# Patient Record
Sex: Female | Born: 1950 | Race: White | Hispanic: No | Marital: Married | Smoking: Never smoker
Health system: Southern US, Community
[De-identification: ages and names within clinical notes are randomized; demographics above are authoritative.]

## PROBLEM LIST (undated history)

## (undated) DIAGNOSIS — I10 Essential (primary) hypertension: Secondary | ICD-10-CM

## (undated) DIAGNOSIS — R569 Unspecified convulsions: Secondary | ICD-10-CM

## (undated) DIAGNOSIS — E669 Obesity, unspecified: Secondary | ICD-10-CM

## (undated) DIAGNOSIS — R809 Proteinuria, unspecified: Secondary | ICD-10-CM

## (undated) DIAGNOSIS — E785 Hyperlipidemia, unspecified: Secondary | ICD-10-CM

## (undated) DIAGNOSIS — Z8249 Family history of ischemic heart disease and other diseases of the circulatory system: Secondary | ICD-10-CM

## (undated) DIAGNOSIS — I77811 Abdominal aortic ectasia: Secondary | ICD-10-CM

## (undated) DIAGNOSIS — E119 Type 2 diabetes mellitus without complications: Secondary | ICD-10-CM

## (undated) DIAGNOSIS — R921 Mammographic calcification found on diagnostic imaging of breast: Secondary | ICD-10-CM

## (undated) DIAGNOSIS — M5416 Radiculopathy, lumbar region: Secondary | ICD-10-CM

## (undated) DIAGNOSIS — H269 Unspecified cataract: Secondary | ICD-10-CM

## (undated) DIAGNOSIS — M810 Age-related osteoporosis without current pathological fracture: Secondary | ICD-10-CM

## (undated) DIAGNOSIS — L409 Psoriasis, unspecified: Secondary | ICD-10-CM

## (undated) DIAGNOSIS — E1129 Type 2 diabetes mellitus with other diabetic kidney complication: Secondary | ICD-10-CM

## (undated) DIAGNOSIS — M1712 Unilateral primary osteoarthritis, left knee: Secondary | ICD-10-CM

## (undated) HISTORY — DX: Age-related osteoporosis without current pathological fracture: M81.0

## (undated) HISTORY — DX: Radiculopathy, lumbar region: M54.16

## (undated) HISTORY — PX: TUBAL LIGATION: SHX77

## (undated) HISTORY — DX: Type 2 diabetes mellitus with other diabetic kidney complication: R80.9

## (undated) HISTORY — DX: Type 2 diabetes mellitus with other diabetic kidney complication: E11.29

## (undated) HISTORY — DX: Family history of ischemic heart disease and other diseases of the circulatory system: Z82.49

## (undated) HISTORY — DX: Hyperlipidemia, unspecified: E78.5

## (undated) HISTORY — DX: Psoriasis, unspecified: L40.9

## (undated) HISTORY — DX: Type 2 diabetes mellitus without complications: E11.9

## (undated) HISTORY — DX: Mammographic calcification found on diagnostic imaging of breast: R92.1

## (undated) HISTORY — DX: Obesity, unspecified: E66.9

## (undated) HISTORY — DX: Unspecified cataract: H26.9

## (undated) HISTORY — DX: Unilateral primary osteoarthritis, left knee: M17.12

## (undated) HISTORY — DX: Abdominal aortic ectasia: I77.811

## (undated) HISTORY — DX: Unspecified convulsions: R56.9

## (undated) HISTORY — DX: Essential (primary) hypertension: I10

---
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
## 1973-02-06 HISTORY — PX: ABDOMINAL HYSTERECTOMY: SHX81

## 2001-02-06 HISTORY — PX: CYSTECTOMY: SUR359

## 2003-11-19 ENCOUNTER — Inpatient Hospital Stay: Payer: Self-pay | Admitting: Internal Medicine

## 2007-02-07 HISTORY — PX: ROUX-EN-Y GASTRIC BYPASS: SHX1104

## 2007-12-01 ENCOUNTER — Emergency Department: Payer: Self-pay | Admitting: Emergency Medicine

## 2012-03-20 ENCOUNTER — Ambulatory Visit: Payer: Self-pay | Admitting: Family Medicine

## 2012-03-26 ENCOUNTER — Ambulatory Visit: Payer: Self-pay | Admitting: Family Medicine

## 2012-04-08 ENCOUNTER — Ambulatory Visit: Payer: Self-pay | Admitting: General Surgery

## 2012-04-09 HISTORY — PX: BREAST BIOPSY: SHX20

## 2012-04-09 LAB — PATHOLOGY REPORT

## 2014-02-03 ENCOUNTER — Ambulatory Visit: Payer: Self-pay | Admitting: Family Medicine

## 2014-02-04 ENCOUNTER — Ambulatory Visit: Payer: Self-pay | Admitting: Family Medicine

## 2014-02-04 LAB — CK-MB: CK-MB: 1.6 ng/mL (ref 0.5–3.6)

## 2014-02-04 LAB — TROPONIN I: Troponin-I: <0.02 ng/mL

## 2014-07-24 ENCOUNTER — Telehealth: Payer: Self-pay | Admitting: Family Medicine

## 2014-07-24 DIAGNOSIS — E785 Hyperlipidemia, unspecified: Secondary | ICD-10-CM

## 2014-07-24 DIAGNOSIS — E1169 Type 2 diabetes mellitus with other specified complication: Secondary | ICD-10-CM | POA: Insufficient documentation

## 2014-07-24 DIAGNOSIS — E669 Obesity, unspecified: Secondary | ICD-10-CM

## 2014-07-24 DIAGNOSIS — E119 Type 2 diabetes mellitus without complications: Secondary | ICD-10-CM | POA: Insufficient documentation

## 2014-07-24 DIAGNOSIS — Z9884 Bariatric surgery status: Secondary | ICD-10-CM | POA: Insufficient documentation

## 2014-07-24 DIAGNOSIS — I10 Essential (primary) hypertension: Secondary | ICD-10-CM | POA: Insufficient documentation

## 2014-07-24 DIAGNOSIS — E663 Overweight: Secondary | ICD-10-CM | POA: Insufficient documentation

## 2014-07-24 MED ORDER — LISINOPRIL 10 MG PO TABS: 10.0000 mg | ORAL_TABLET | Freq: Every day | ORAL | Status: DC

## 2014-07-24 NOTE — Telephone Encounter (Signed)
Appt [phone]

## 2014-07-24 NOTE — Telephone Encounter (Signed)
Please let [name] know that I'd like to see patient for an appointment here in the office for:  Diabetes, hypertension, high cholesterol Please schedule a visit with me  in the next: 1 month Fasting?  Yes, please Thank you, Dr. Sherie Don

## 2014-07-24 NOTE — Telephone Encounter (Signed)
E-Fax came through for refill: Rx: Lisinopril Rx in basket next to my desk.

## 2014-08-12 DIAGNOSIS — M1712 Unilateral primary osteoarthritis, left knee: Secondary | ICD-10-CM | POA: Insufficient documentation

## 2014-08-12 DIAGNOSIS — E785 Hyperlipidemia, unspecified: Secondary | ICD-10-CM | POA: Insufficient documentation

## 2014-08-12 DIAGNOSIS — I10 Essential (primary) hypertension: Secondary | ICD-10-CM | POA: Insufficient documentation

## 2014-08-18 ENCOUNTER — Ambulatory Visit (INDEPENDENT_AMBULATORY_CARE_PROVIDER_SITE_OTHER): Payer: 59 | Admitting: Family Medicine

## 2014-08-18 ENCOUNTER — Encounter: Payer: Self-pay | Admitting: Family Medicine

## 2014-08-18 VITALS — BP 120/75 | HR 57 | Temp 97.3°F | Wt 175.0 lb

## 2014-08-18 DIAGNOSIS — E559 Vitamin D deficiency, unspecified: Secondary | ICD-10-CM | POA: Diagnosis not present

## 2014-08-18 DIAGNOSIS — Z9884 Bariatric surgery status: Secondary | ICD-10-CM

## 2014-08-18 DIAGNOSIS — E785 Hyperlipidemia, unspecified: Secondary | ICD-10-CM | POA: Diagnosis not present

## 2014-08-18 DIAGNOSIS — I10 Essential (primary) hypertension: Secondary | ICD-10-CM | POA: Diagnosis not present

## 2014-08-18 DIAGNOSIS — Z5181 Encounter for therapeutic drug level monitoring: Secondary | ICD-10-CM | POA: Diagnosis not present

## 2014-08-18 DIAGNOSIS — E119 Type 2 diabetes mellitus without complications: Secondary | ICD-10-CM | POA: Diagnosis not present

## 2014-08-18 NOTE — Assessment & Plan Note (Signed)
Last level in the 23 range; getting sunshine, wearing sunscreen sometimes; caution about skin cancer; never lay out in the sun; check level

## 2014-08-18 NOTE — Assessment & Plan Note (Signed)
Check A1C; check feet regularly; foot exam by MD today; eye exam UTD; limit sweets, stay active; cannot take aspirin because of hx of gastric bypass

## 2014-08-18 NOTE — Patient Instructions (Signed)
Your goal blood pressure is less than 130 systolic Try to follow the DASH guidelines (DASH stands for Dietary Approaches to Stop Hypertension) Try to limit the sodium in your diet.  Ideally, consume less than 1.5 grams (less than 1,500mg ) per day. Do not add salt when cooking or at the table.  Check the sodium amount on labels when shopping, and choose items lower in sodium when given a choice. Avoid or limit foods that already contain a lot of sodium. Eat a diet rich in fruits and vegetables and whole grains. Return in 6 months Check your feet every night See your eye doctor regularly

## 2014-08-18 NOTE — Progress Notes (Signed)
BP 120/75 mmHg  Pulse 57  Temp(Src) 97.3 F (36.3 C)  Wt 175 lb (79.379 kg)  SpO2 99%   Subjective:    Patient ID: Sandy Salinas, female    DOB: 1950/04/24, 64 y.o.   MRN: [id number]  HPI: Sandy LovelyVeronica A Novinger is a 64 y.o. female  Chief Complaint  Patient presents with  . Diabetes  . Hyperlipidemia  . Hypertension   Diabetes mellitus type 2 Patient has had diabetes since around 2001 Checking sugars?  not regularly; only when a little shaky; 70-100 when shaky, symptomatic Feels lows from time to time; okay to drive, just a little shaky; resolved with a little something to eat Does patient feel additional teaching/training would be helpful?  no Trying to limit white bread, white rice, white potatoes, sweets?  yes limiting sweets, but could do better with potatoes Trying to limit sweetened drinks like iced tea, soft drinks, sports drinks, fruit juices?  yes Exercise/activity level:  occasional (maybe once or twice a week), doing housework and yardwork regularly, pretty active Checking feet every day/night?  yes Last eye exam:  A month or so ago Diabetes-associated complications:  none  Hypertension Checking blood pressure away from here?  yes How often? occasionally Range usually 120s Feels blood pressure is under very good control Hypertension-associated complications:  none If taking medicines, are you taking them regularly?  yes Sudafed (decongestants): Do you use any OTC decongestant products like Allegra-D, Claritin-D, Zyrtec-D, Tylenol Cold and Sinus, etc.?  no   High cholesterol Trying to limit saturated fats; no eggs  Psoriasis; going to do Humira; they just checked her CBC, folic acid and B12 levels  Relevant past medical, surgical, family and social history reviewed and updated as indicated. Interim medical history since our last visit reviewed. Allergies and medications reviewed and updated.  Review of Systems  Constitutional: Negative for unexpected  weight change.  Respiratory: Negative for shortness of breath.   Cardiovascular: Negative for chest pain and leg swelling.  Gastrointestinal: Negative for blood in stool.  Genitourinary: Negative for hematuria.  Psychiatric/Behavioral: Negative for dysphoric mood. The patient is not nervous/anxious.    Per HPI unless specifically indicated above     Objective:    BP 120/75 mmHg  Pulse 57  Temp(Src) 97.3 F (36.3 C)  Wt 175 lb (79.379 kg)  SpO2 99%  Wt Readings from Last 3 Encounters:  08/18/14 175 lb (79.379 kg)  02/25/14 176 lb (79.833 kg)    Physical Exam  Constitutional: She appears well-developed and well-nourished. No distress.  HENT:  Head: Normocephalic and atraumatic.  Eyes: EOM are normal. No scleral icterus.  Neck: No thyromegaly present.  Cardiovascular: Normal rate, regular rhythm and normal heart sounds.   No murmur heard. Pulmonary/Chest: Effort normal and breath sounds normal. No respiratory distress. She has no wheezes.  Abdominal: Soft. Bowel sounds are normal. She exhibits no distension.  Musculoskeletal: Normal range of motion. She exhibits no edema.  Neurological: She is alert. She exhibits normal muscle tone.  Skin: Skin is warm and dry. She is not diaphoretic. No pallor.  Psychiatric: She has a normal mood and affect. Her behavior is normal. Judgment and thought content normal.   Diabetic Foot Form - Detailed   Diabetic Foot Exam - detailed  Diabetic Foot exam was performed with the following findings:  Yes 08/18/2014  8:29 AM  Visual Foot Exam completed.:  Yes  Is there a history of foot ulcer?:  No  Can the patient see the  bottom of their feet?:  Yes  Is there swelling or and abnormal foot shape?:  No  Are the toenails long?:  No  Are the toenails thick?:  No  Are the toenails ingrown?:  No    Pulse Foot Exam completed.:  Yes  Right Dorsalis Pedis:  Present Left Dorsalis Pedis:  Present  Semmes-Weinstein Monofilament Test  R Site 1-Great Toe:   Pos L Site 1-Great Toe:  Pos  R Site 4:  Pos L Site 4:  Pos    Comments:  Shallow irritation of plantar surface two toe pads, no erythema or drainage        Assessment & Plan:   Problem List Items Addressed This Visit      Cardiovascular and Mediastinum   Hypertension    Under excellent control        Endocrine   Diabetes mellitus without complication - Primary    Check A1C; check feet regularly; foot exam by MD today; eye exam UTD; limit sweets, stay active; cannot take aspirin because of hx of gastric bypass      Relevant Orders   Hgb A1c w/o eAG     Other   Status post bariatric surgery    Maintaining weight; check B12, folic acid already checked by dermatologist      Relevant Orders   Comprehensive metabolic panel   Hyperlipidemia    Continue statin, limit saturated fats; check lipid and sgpt      Relevant Orders   Lipid Panel w/o Chol/HDL Ratio   Vitamin D deficiency    Last level in the 23 range; getting sunshine, wearing sunscreen sometimes; caution about skin cancer; never lay out in the sun; check level      Relevant Orders   Vit D  25 hydroxy (rtn osteoporosis monitoring)    Other Visit Diagnoses    Medication monitoring encounter        Relevant Orders    Comprehensive metabolic panel       Orders Placed This Encounter  Procedures  . Vit D  25 hydroxy (rtn osteoporosis monitoring)  . Comprehensive metabolic panel  . Lipid Panel w/o Chol/HDL Ratio  . Hgb A1c w/o eAG   An After Visit Summary was printed and given to the patient.  Follow up plan: Return in about 6 months (around 02/18/2015) for diabetes.

## 2014-08-18 NOTE — Assessment & Plan Note (Signed)
Maintaining weight; check B12, folic acid already checked by dermatologist

## 2014-08-18 NOTE — Assessment & Plan Note (Signed)
Under excellent control 

## 2014-08-18 NOTE — Assessment & Plan Note (Signed)
Continue statin, limit saturated fats; check lipid and sgpt

## 2014-08-19 LAB — COMPREHENSIVE METABOLIC PANEL
ALT: 10 IU/L (ref 0–32)
AST: 20 IU/L (ref 0–40)
Albumin/Globulin Ratio: 1.8 (ref 1.1–2.5)
Albumin: 4.3 g/dL (ref 3.6–4.8)
Alkaline Phosphatase: 50 IU/L (ref 39–117)
BUN/Creatinine Ratio: 15 (ref 11–26)
BUN: 13 mg/dL (ref 8–27)
Bilirubin Total: 0.4 mg/dL (ref 0.0–1.2)
CALCIUM: 9.5 mg/dL (ref 8.7–10.3)
CO2: 28 mmol/L (ref 18–29)
CREATININE: 0.85 mg/dL (ref 0.57–1.00)
Chloride: 99 mmol/L (ref 97–108)
GFR, EST AFRICAN AMERICAN: 84 mL/min/{1.73_m2} (ref >59)
GFR, EST NON AFRICAN AMERICAN: 73 mL/min/{1.73_m2} (ref >59)
GLOBULIN, TOTAL: 2.4 g/dL (ref 1.5–4.5)
Glucose: 107 mg/dL — ABNORMAL HIGH (ref 65–99)
Potassium: 4.8 mmol/L (ref 3.5–5.2)
Sodium: 143 mmol/L (ref 134–144)
TOTAL PROTEIN: 6.7 g/dL (ref 6.0–8.5)

## 2014-08-19 LAB — LIPID PANEL W/O CHOL/HDL RATIO
CHOLESTEROL TOTAL: 134 mg/dL (ref 100–199)
HDL: 35 mg/dL — ABNORMAL LOW (ref >39)
LDL Calculated: 73 mg/dL (ref 0–99)
TRIGLYCERIDES: 129 mg/dL (ref 0–149)
VLDL CHOLESTEROL CAL: 26 mg/dL (ref 5–40)

## 2014-08-19 LAB — HGB A1C W/O EAG: HEMOGLOBIN A1C: 7.5 % — AB (ref 4.8–5.6)

## 2014-08-19 LAB — VITAMIN D 25 HYDROXY (VIT D DEFICIENCY, FRACTURES): Vit D, 25-Hydroxy: 34.6 ng/mL (ref 30.0–100.0)

## 2014-08-23 ENCOUNTER — Other Ambulatory Visit: Payer: Self-pay | Admitting: Family Medicine

## 2014-08-27 ENCOUNTER — Other Ambulatory Visit: Payer: Self-pay | Admitting: Family Medicine

## 2014-08-27 NOTE — Telephone Encounter (Signed)
Routing to provider  

## 2014-08-28 ENCOUNTER — Other Ambulatory Visit: Payer: Self-pay | Admitting: Family Medicine

## 2014-11-30 ENCOUNTER — Encounter: Payer: Self-pay | Admitting: Family Medicine

## 2014-11-30 ENCOUNTER — Ambulatory Visit (INDEPENDENT_AMBULATORY_CARE_PROVIDER_SITE_OTHER): Payer: 59 | Admitting: Family Medicine

## 2014-11-30 VITALS — BP 133/79 | HR 75 | Temp 98.5°F | Ht 64.0 in | Wt 176.0 lb

## 2014-11-30 DIAGNOSIS — J069 Acute upper respiratory infection, unspecified: Secondary | ICD-10-CM

## 2014-11-30 MED ORDER — BENZONATATE 100 MG PO CAPS: 100.0000 mg | ORAL_CAPSULE | Freq: Three times a day (TID) | ORAL | Status: DC | PRN

## 2014-11-30 MED ORDER — ALBUTEROL SULFATE HFA 108 (90 BASE) MCG/ACT IN AERS: 2.0000 | INHALATION_SPRAY | RESPIRATORY_TRACT | Status: DC | PRN

## 2014-11-30 NOTE — Progress Notes (Signed)
   BP 133/79 mmHg  Pulse 75  Temp(Src) 98.5 F (36.9 C)  Ht 5\' 4"  (1.626 m)  Wt 176 lb (79.833 kg)  BMI 30.20 kg/m2  SpO2 96%   Subjective:    Patient ID: Sandy Salinas, female    DOB: 1950/08/24, 64 y.o.   MRN: [id number]  HPI: Sandy LovelyVeronica A Courter is a 64 y.o. female  Chief Complaint  Patient presents with  . URI    headache, cough, coughing up light green stuff ear pain, head and chest congestion, has been hot and cold, but not had a temp check.   She has been sick for a couple of weeks  Not sure if flu or sinus infection or what Head was hurting, drainage down from her sinuses into her throat; coughing; light green sputum, little bit Sinus drainage is greenish; left ear was stopped up; she was wheezing this morning when she awoke Tired; oxygen good here today; throat was irritated from drainage No more fevers; hot and cold alternating No travel; no rash She has tried alka-seltzer cold plus, helped some for improvement She has Humira treatment planned for tomorrow, but she's not going; aware of immune suppression  Relevant past medical, surgical, family and social history reviewed and updated as indicated. Interim medical history since our last visit reviewed. Allergies and medications reviewed and updated.  Review of Systems Per HPI unless specifically indicated above     Objective:    BP 133/79 mmHg  Pulse 75  Temp(Src) 98.5 F (36.9 C)  Ht 5\' 4"  (1.626 m)  Wt 176 lb (79.833 kg)  BMI 30.20 kg/m2  SpO2 96%  Wt Readings from Last 3 Encounters:  11/30/14 176 lb (79.833 kg)  08/18/14 175 lb (79.379 kg)  02/25/14 176 lb (79.833 kg)    Physical Exam  Constitutional: She appears well-developed and well-nourished.  Non-toxic appearance. She does not have a sickly appearance. She does not appear ill. No distress.  HENT:  Right Ear: Hearing, tympanic membrane, external ear and ear canal normal. Tympanic membrane is not injected. No middle ear effusion.  Left Ear:  Hearing, tympanic membrane, external ear and ear canal normal. Tympanic membrane is not injected.  No middle ear effusion.  Nose: Rhinorrhea present. No mucosal edema.  Mouth/Throat: Oropharynx is clear and moist and mucous membranes are normal. No oropharyngeal exudate, posterior oropharyngeal edema or posterior oropharyngeal erythema.  Eyes: Right eye exhibits no discharge. Left eye exhibits no discharge.  Cardiovascular: Normal rate and regular rhythm.   Pulmonary/Chest: Effort normal and breath sounds normal. No respiratory distress. She has no decreased breath sounds. She has no wheezes. She has no rhonchi.  Skin: No rash noted.      Assessment & Plan:   Problem List Items Addressed This Visit    None    Visit Diagnoses    Upper respiratory infection    -  Primary    treat with SABA, rest, hydration, mucinex, tessalon perles if needed; watch for s/s of secondary infection; no antibiotics at this time        Follow up plan: Return if symptoms worsen or fail to improve.  Postpone Humira until well

## 2014-11-30 NOTE — Patient Instructions (Addendum)
Use PLAIN Mucinex to help loosen phlegm Use the inhaler if needed to open up airways You can use the Tessalon Perles (benzonatate) for cough if desired Try vitamin C (orange juice if not diabetic or vitamin C tablets) and drink green tea to help your immune system during your illness Get plenty of rest and hydration Watch for signs/symptoms of secondary bacterial infection and seek medical care right away  Viral Infections A viral infection can be caused by different types of viruses.Most viral infections are not serious and resolve on their own. However, some infections may cause severe symptoms and may lead to further complications. SYMPTOMS Viruses can frequently cause:  Minor sore throat.  Aches and pains.  Headaches.  Runny nose.  Different types of rashes.  Watery eyes.  Tiredness.  Cough.  Loss of appetite.  Gastrointestinal infections, resulting in nausea, vomiting, and diarrhea. These symptoms do not respond to antibiotics because the infection is not caused by bacteria. However, you might catch a bacterial infection following the viral infection. This is sometimes called a "superinfection." Symptoms of such a bacterial infection may include:  Worsening sore throat with pus and difficulty swallowing.  Swollen neck glands.  Chills and a high or persistent fever.  Severe headache.  Tenderness over the sinuses.  Persistent overall ill feeling (malaise), muscle aches, and tiredness (fatigue).  Persistent cough.  Yellow, green, or brown mucus production with coughing. HOME CARE INSTRUCTIONS   Only take over-the-counter or prescription medicines for pain, discomfort, diarrhea, or fever as directed by your caregiver.  Drink enough water and fluids to keep your urine clear or pale yellow. Sports drinks can provide valuable electrolytes, sugars, and hydration.  Get plenty of rest and maintain proper nutrition. Soups and broths with crackers or rice are  fine. SEEK IMMEDIATE MEDICAL CARE IF:   You have severe headaches, shortness of breath, chest pain, neck pain, or an unusual rash.  You have uncontrolled vomiting, diarrhea, or you are unable to keep down fluids.  You or your child has an oral temperature above 102 F (38.9 C), not controlled by medicine.  Your baby is older than 3 months with a rectal temperature of 102 F (38.9 C) or higher.  Your baby is 413 months old or younger with a rectal temperature of 100.4 F (38 C) or higher. MAKE SURE YOU:   Understand these instructions.  Will watch your condition.  Will get help right away if you are not doing well or get worse.   This information is not intended to replace advice given to you by your health care provider. Make sure you discuss any questions you have with your health care provider.   Document Released: 11/02/2004 Document Revised: 04/17/2011 Document Reviewed: 07/01/2014 Elsevier Interactive Patient Education [employer]2016 [employer]

## 2014-12-01 ENCOUNTER — Other Ambulatory Visit: Payer: Self-pay | Admitting: Family Medicine

## 2014-12-02 NOTE — Telephone Encounter (Signed)
CMP 08/18/14 reviewed; rx approved

## 2014-12-02 NOTE — Telephone Encounter (Signed)
Routing to provider  

## 2014-12-08 ENCOUNTER — Encounter: Payer: Self-pay | Admitting: Family Medicine

## 2014-12-08 ENCOUNTER — Telehealth: Payer: Self-pay | Admitting: Family Medicine

## 2014-12-08 ENCOUNTER — Ambulatory Visit (INDEPENDENT_AMBULATORY_CARE_PROVIDER_SITE_OTHER): Payer: 59 | Admitting: Family Medicine

## 2014-12-08 VITALS — BP 135/79 | HR 63 | Temp 97.0°F

## 2014-12-08 DIAGNOSIS — J069 Acute upper respiratory infection, unspecified: Secondary | ICD-10-CM | POA: Diagnosis not present

## 2014-12-08 MED ORDER — PREDNISONE 50 MG PO TABS: 50.0000 mg | ORAL_TABLET | Freq: Every day | ORAL | Status: AC

## 2014-12-08 MED ORDER — HYDROCOD POLST-CPM POLST ER 10-8 MG/5ML PO SUER: 5.0000 mL | Freq: Every evening | ORAL | Status: DC | PRN

## 2014-12-08 NOTE — Telephone Encounter (Signed)
Needs to be evaluated; appt with provider here today or urgent care or appt tomorrow if not severe and she can wait

## 2014-12-08 NOTE — Telephone Encounter (Signed)
Patient notified, appointment scheduled for this afternoon with Dr. Laural BenesJohnson.

## 2014-12-08 NOTE — Telephone Encounter (Signed)
Routing to provider for recommendations

## 2014-12-08 NOTE — Telephone Encounter (Signed)
Pt says she has the ear ache and cough and feels like it may be an infection and would like to know what to do.

## 2014-12-08 NOTE — Progress Notes (Signed)
BP 135/79 mmHg  Pulse 63  Temp(Src) 97 F (36.1 C)  Ht   Wt   SpO2 95%   Subjective:    Patient ID: Sandy Salinas, female    DOB: 11-14-1950, 64 y.o.   MRN: [id number]  HPI: Sandy Salinas is a 64 y.o. female  Chief Complaint  Patient presents with  . Cough  . Ear Pain   UPPER RESPIRATORY TRACT INFECTION- has not been taking her humira for the last 3 weeks due to cold. Does not plan on taking it for the next week. Feeling better, but not 100% Duration: 3 weeks Worst symptom: coughing, and ear pain Fever: no Cough: yes, non-productive Shortness of breath: no Wheezing: yes Chest pain: no Chest tightness: no Chest congestion: no Nasal congestion: no- last week Runny nose: yes Post nasal drip: yes Sneezing: no Sore throat: no- last week Swollen glands: no- last week Sinus pressure: no Headache: no - last week Face pain: no Toothache: no Ear pain: yes left Ear pressure: yes left Eyes red/itching:no Eye drainage/crusting: no  Vomiting: no Rash: no Fatigue: yes Sick contacts: no Strep contacts: no  Context: better Recurrent sinusitis: no Relief with OTC cold/cough medications: yes  Treatments attempted: cough syrup    Relevant past medical, surgical, family and social history reviewed and updated as indicated. Interim medical history since our last visit reviewed. Allergies and medications reviewed and updated.  Review of Systems  Constitutional: Negative.   Respiratory: Negative.   Cardiovascular: Negative.   Musculoskeletal: Negative.   Psychiatric/Behavioral: Negative.    Per HPI unless specifically indicated above    Objective:    BP 135/79 mmHg  Pulse 63  Temp(Src) 97 F (36.1 C)  Ht   Wt   SpO2 95%  Wt Readings from Last 3 Encounters:  11/30/14 176 lb (79.833 kg)  08/18/14 175 lb (79.379 kg)  02/25/14 176 lb (79.833 kg)    Physical Exam  Constitutional: She is oriented to person, place, and time. She appears well-developed and  well-nourished. No distress.  HENT:  Head: Normocephalic and atraumatic.  Right Ear: Hearing, tympanic membrane, external ear and ear canal normal.  Left Ear: Hearing, tympanic membrane, external ear and ear canal normal.  Nose: Mucosal edema and rhinorrhea present. No nose lacerations, sinus tenderness, nasal deformity, septal deviation or nasal septal hematoma. No epistaxis.  No foreign bodies. Right sinus exhibits no maxillary sinus tenderness and no frontal sinus tenderness. Left sinus exhibits no maxillary sinus tenderness and no frontal sinus tenderness.  Mouth/Throat: Uvula is midline and mucous membranes are normal. Posterior oropharyngeal edema present. No oropharyngeal exudate, posterior oropharyngeal erythema or tonsillar abscesses.  Eyes: Conjunctivae, EOM and lids are normal. Pupils are equal, round, and reactive to light. Right eye exhibits no discharge. Left eye exhibits no discharge. No scleral icterus.  Neck: Normal range of motion. Neck supple. No JVD present. No tracheal deviation present. No thyromegaly present.  Cardiovascular: Normal rate, regular rhythm, normal heart sounds and intact distal pulses.  Exam reveals no gallop and no friction rub.   No murmur heard. Pulmonary/Chest: Effort normal and breath sounds normal. No stridor. No respiratory distress. She has no wheezes. She has no rales. She exhibits no tenderness.  Musculoskeletal: Normal range of motion.  Lymphadenopathy:    She has no cervical adenopathy.  Neurological: She is alert and oriented to person, place, and time.  Skin: Skin is intact. No rash noted.  Psychiatric: She has a normal mood and affect. Her speech is  normal and behavior is normal. Judgment and thought content normal. Cognition and memory are normal.  Nursing note and vitals reviewed.   Results for orders placed or performed in visit on 08/18/14  Vit D  25 hydroxy (rtn osteoporosis monitoring)  Result Value Ref Range   Vit D, 25-Hydroxy 34.6  30.0 - 100.0 ng/mL  Comprehensive metabolic panel  Result Value Ref Range   Glucose 107 (H) 65 - 99 mg/dL   BUN 13 8 - 27 mg/dL   Creatinine, Ser 0.450.85 0.57 - 1.00 mg/dL   GFR calc non Af Amer 73 >59 mL/min/1.73   GFR calc Af Amer 84 >59 mL/min/1.73   BUN/Creatinine Ratio 15 11 - 26   Sodium 143 134 - 144 mmol/L   Potassium 4.8 3.5 - 5.2 mmol/L   Chloride 99 97 - 108 mmol/L   CO2 28 18 - 29 mmol/L   Calcium 9.5 8.7 - 10.3 mg/dL   Total Protein 6.7 6.0 - 8.5 g/dL   Albumin 4.3 3.6 - 4.8 g/dL   Globulin, Total 2.4 1.5 - 4.5 g/dL   Albumin/Globulin Ratio 1.8 1.1 - 2.5   Bilirubin Total 0.4 0.0 - 1.2 mg/dL   Alkaline Phosphatase 50 39 - 117 IU/L   AST 20 0 - 40 IU/L   ALT 10 0 - 32 IU/L  Lipid Panel w/o Chol/HDL Ratio  Result Value Ref Range   Cholesterol, Total 134 100 - 199 mg/dL   Triglycerides 409129 0 - 149 mg/dL   HDL 35 (L) >81>39 mg/dL   VLDL Cholesterol Cal 26 5 - 40 mg/dL   LDL Calculated 73 0 - 99 mg/dL  Hgb [id number] w/o eAG  Result Value Ref Range   Hgb A1c MFr Bld 7.5 (H) 4.8 - 5.6 %      Assessment & Plan:   Problem List Items Addressed This Visit    None    Visit Diagnoses    Upper respiratory infection    -  Primary    No sign of bacterial infection. 4 days of prednisone to help with swelling. Tussionex for cough. Increase tessalon to 200mg  TID. Call if not better by Monday.        Follow up plan: Return if symptoms worsen or fail to improve.

## 2014-12-14 ENCOUNTER — Ambulatory Visit
Admission: RE | Admit: 2014-12-14 | Discharge: 2014-12-14 | Disposition: A | Discharge status: Home / Self Care | Payer: 59 | Source: Ambulatory Visit | Attending: Family Medicine | Admitting: Family Medicine

## 2014-12-14 ENCOUNTER — Telehealth: Payer: Self-pay | Admitting: Family Medicine

## 2014-12-14 ENCOUNTER — Ambulatory Visit (INDEPENDENT_AMBULATORY_CARE_PROVIDER_SITE_OTHER): Payer: 59 | Admitting: Family Medicine

## 2014-12-14 ENCOUNTER — Encounter: Payer: Self-pay | Admitting: Family Medicine

## 2014-12-14 VITALS — BP 119/73 | HR 60 | Temp 98.3°F | Wt 178.0 lb

## 2014-12-14 DIAGNOSIS — R05 Cough: Secondary | ICD-10-CM | POA: Diagnosis not present

## 2014-12-14 DIAGNOSIS — R059 Cough, unspecified: Secondary | ICD-10-CM

## 2014-12-14 NOTE — Telephone Encounter (Signed)
Called and let her know that her CXR came back normal.

## 2014-12-14 NOTE — Progress Notes (Signed)
BP 119/73 mmHg  Pulse 60  Temp(Src) 98.3 F (36.8 C)  Wt 178 lb (80.74 kg)  SpO2 96%   Subjective:    Patient ID: Sandy LovelyVeronica A Vaeth, female    DOB: December 22, 1950, 64 y.o.   MRN: [id number]  HPI: Sandy Salinas is a 64 y.o. female  Chief Complaint  Patient presents with  . Cough    Patient states that she has a deep cough that feels like it shouold be productive but nothing will come up   UPPER RESPIRATORY TRACT INFECTION- did very well with the prednisone, but then seemed to come back. Has been taking the cough syrup at night and that seemed to help. Tessalon perles didn't seem like it helps.  Duration: 4 weeks Worst symptom: cough Fever: no Cough: yes Shortness of breath: no Wheezing: yes Chest pain: no Chest tightness: yes Chest congestion: no Nasal congestion: yes Runny nose: yes Post nasal drip: yes Sneezing: no Sore throat: yes Swollen glands: no Sinus pressure: no Headache: no Face pain: no Toothache: no Ear pain: no  Ear pressure: no  Eyes red/itching:no Eye drainage/crusting: no  Vomiting: no Rash: no Fatigue: yes Sick contacts: no Strep contacts: no  Context: better Recurrent sinusitis: no Relief with OTC cold/cough medications: no  Treatments attempted: cough syrup   Relevant past medical, surgical, family and social history reviewed and updated as indicated. Interim medical history since our last visit reviewed. Allergies and medications reviewed and updated.  Review of Systems  Constitutional: Negative.   HENT: Negative.   Respiratory: Negative.   Cardiovascular: Negative.   Psychiatric/Behavioral: Negative.     Per HPI unless specifically indicated above     Objective:    BP 119/73 mmHg  Pulse 60  Temp(Src) 98.3 F (36.8 C)  Wt 178 lb (80.74 kg)  SpO2 96%  Wt Readings from Last 3 Encounters:  12/14/14 178 lb (80.74 kg)  11/30/14 176 lb (79.833 kg)  08/18/14 175 lb (79.379 kg)    Physical Exam  Constitutional: She is oriented  to person, place, and time. She appears well-developed and well-nourished. No distress.  HENT:  Head: Normocephalic and atraumatic.  Right Ear: Hearing and external ear normal.  Left Ear: Hearing and external ear normal.  Nose: Nose normal.  Mouth/Throat: Oropharynx is clear and moist. No oropharyngeal exudate.  Eyes: Conjunctivae, EOM and lids are normal. Pupils are equal, round, and reactive to light. Right eye exhibits no discharge. Left eye exhibits no discharge. No scleral icterus.  Neck: Normal range of motion. Neck supple.  Cardiovascular: Normal rate, regular rhythm and intact distal pulses.  Exam reveals no gallop and no friction rub.   Murmur heard. Pulmonary/Chest: Effort normal and breath sounds normal. No respiratory distress. She has no wheezes. She has no rales. She exhibits no tenderness.  Musculoskeletal: Normal range of motion.  Neurological: She is alert and oriented to person, place, and time.  Skin: Skin is warm, dry and intact. No rash noted. She is not diaphoretic. No erythema. No pallor.  Psychiatric: She has a normal mood and affect. Her speech is normal and behavior is normal. Judgment and thought content normal. Cognition and memory are normal.  Nursing note and vitals reviewed.   Results for orders placed or performed in visit on 08/18/14  Vit D  25 hydroxy (rtn osteoporosis monitoring)  Result Value Ref Range   Vit D, 25-Hydroxy 34.6 30.0 - 100.0 ng/mL  Comprehensive metabolic panel  Result Value Ref Range   Glucose 107 (H)  65 - 99 mg/dL   BUN 13 8 - 27 mg/dL   Creatinine, Ser 4.78 0.57 - 1.00 mg/dL   GFR calc non Af Amer 73 >59 mL/min/1.73   GFR calc Af Amer 84 >59 mL/min/1.73   BUN/Creatinine Ratio 15 11 - 26   Sodium 143 134 - 144 mmol/L   Potassium 4.8 3.5 - 5.2 mmol/L   Chloride 99 97 - 108 mmol/L   CO2 28 18 - 29 mmol/L   Calcium 9.5 8.7 - 10.3 mg/dL   Total Protein 6.7 6.0 - 8.5 g/dL   Albumin 4.3 3.6 - 4.8 g/dL   Globulin, Total 2.4 1.5 - 4.5  g/dL   Albumin/Globulin Ratio 1.8 1.1 - 2.5   Bilirubin Total 0.4 0.0 - 1.2 mg/dL   Alkaline Phosphatase 50 39 - 117 IU/L   AST 20 0 - 40 IU/L   ALT 10 0 - 32 IU/L  Lipid Panel w/o Chol/HDL Ratio  Result Value Ref Range   Cholesterol, Total 134 100 - 199 mg/dL   Triglycerides 295 0 - 149 mg/dL   HDL 35 (L) >62 mg/dL   VLDL Cholesterol Cal 26 5 - 40 mg/dL   LDL Calculated 73 0 - 99 mg/dL  Hgb Z3Y w/o eAG  Result Value Ref Range   Hgb A1c MFr Bld 7.5 (H) 4.8 - 5.6 %      Assessment & Plan:   Problem List Items Addressed This Visit    None    Visit Diagnoses    Cough    -  Primary    Lungs clear. Likely post-infectious cough. Will check CXR to make sure nothing hiding. Continue inhaler. Continue to monitor. No sign of infection.    Relevant Orders    DG Chest 2 View        Follow up plan: Return if symptoms worsen or fail to improve.

## 2015-02-18 ENCOUNTER — Ambulatory Visit (INDEPENDENT_AMBULATORY_CARE_PROVIDER_SITE_OTHER): Payer: 59 | Admitting: Family Medicine

## 2015-02-18 ENCOUNTER — Encounter: Payer: Self-pay | Admitting: Family Medicine

## 2015-02-18 VITALS — BP 124/78 | HR 63 | Temp 97.3°F | Ht 64.0 in | Wt 175.0 lb

## 2015-02-18 DIAGNOSIS — E119 Type 2 diabetes mellitus without complications: Secondary | ICD-10-CM

## 2015-02-18 DIAGNOSIS — I10 Essential (primary) hypertension: Secondary | ICD-10-CM | POA: Diagnosis not present

## 2015-02-18 DIAGNOSIS — E785 Hyperlipidemia, unspecified: Secondary | ICD-10-CM

## 2015-02-18 DIAGNOSIS — E669 Obesity, unspecified: Secondary | ICD-10-CM | POA: Diagnosis not present

## 2015-02-18 DIAGNOSIS — Z5181 Encounter for therapeutic drug level monitoring: Secondary | ICD-10-CM

## 2015-02-18 DIAGNOSIS — E559 Vitamin D deficiency, unspecified: Secondary | ICD-10-CM | POA: Diagnosis not present

## 2015-02-18 DIAGNOSIS — Z23 Encounter for immunization: Secondary | ICD-10-CM | POA: Diagnosis not present

## 2015-02-18 LAB — MICROALBUMIN, URINE WAIVED
CREATININE, URINE WAIVED: 50 mg/dL (ref 10–300)
MICROALB, UR WAIVED: 10 mg/L (ref 0–19)
Microalb/Creat Ratio: <30 mg/g (ref <30)

## 2015-02-18 NOTE — Progress Notes (Signed)
BP 124/78 mmHg  Pulse 63  Temp(Src) 97.3 F (36.3 C)  Ht 5\' 4"  (1.626 m)  Wt 175 lb (79.379 kg)  BMI 30.02 kg/m2  SpO2 98%   Subjective:    Patient ID: Sandy Salinas, female    DOB: 1950/09/12, 65 y.o.   MRN: [id number]  HPI: Sandy Salinas is a 65 y.o. female  Chief Complaint  Patient presents with  . Diabetes    sugar has been good  . Hypertension    just routine follow up  . Hyperlipidemia    just routine follow up  . Immunizations    the pharmacy told her she needed a special kind of flu shot since she is on Humira.    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 Adolphus Birchwood later today at [facility] dermatologist office  Relevant past medical, surgical, family and social history reviewed and updated as indicated. Interim medical history since our last visit reviewed. Allergies and medications reviewed and updated.  Review of Systems Lots of gas; drinks out of a straw Per HPI unless specifically indicated above     Objective:    BP 124/78 mmHg  Pulse 63  Temp(Src) 97.3 F (36.3 C)  Ht 5\' 4"  (1.626 m)  Wt 175 lb (79.379 kg)  BMI 30.02 kg/m2  SpO2 98%  Wt Readings from Last 3 Encounters:  02/18/15 175 lb (79.379 kg)  12/14/14 178 lb (80.74 kg)  11/30/14 176 lb (79.833 kg)   Today's Vitals   02/18/15 0805 02/18/15 0831  BP: 149/80 124/78  Pulse: 67  63  Temp: 97.3 F (36.3 C)   Height: 5\' 4"  (1.626 m)   Weight: 175 lb (79.379 kg)   SpO2: 98%     Physical Exam  Constitutional: She appears well-developed and well-nourished. No distress.  HENT:  Head: Normocephalic and atraumatic.  Eyes: EOM are normal. No scleral icterus.  Neck: No thyromegaly present.  Cardiovascular: Normal rate, regular rhythm and normal heart sounds.   No murmur heard. Pulmonary/Chest: Effort normal and breath sounds normal. No respiratory distress. She has no wheezes.  Abdominal: Soft. Bowel sounds are normal. She exhibits no distension.  Musculoskeletal: Normal range of motion. She exhibits no edema.  Neurological: She is alert. She exhibits normal muscle tone.  Skin: Skin is warm and dry. She is not diaphoretic. No pallor.  Dry skin on the soles of the feet  Psychiatric: She has a normal mood and affect. Her speech is normal and behavior is normal. Judgment and thought content normal.   Diabetic Foot Form - Detailed   Diabetic Foot Exam - detailed  Diabetic Foot exam was performed with the following findings:  Yes 02/18/2015  8:42 AM  Visual Foot Exam completed.:  Yes  Are the toenails long?:  No  Are the toenails  thick?:  No  Are the toenails ingrown?:  No  Normal Range of Motion:  Yes    Pulse Foot Exam completed.:  Yes  Right Dorsalis Pedis:  Present Left Dorsalis Pedis:  Present  Sensory Foot Exam Completed.:  Yes  Swelling:  No  Semmes-Weinstein Monofilament Test  R Site 1-Great Toe:  Pos L Site 1-Great Toe:  Pos  R Site 4:  Pos L Site 4:  Pos  R Site 5:  Pos L Site 5:  Pos       Results for orders placed or performed in visit on 08/18/14  Vit D  25 hydroxy (rtn osteoporosis monitoring)  Result Value Ref Range   Vit D, 25-Hydroxy 34.6 30.0 - 100.0 ng/mL  Comprehensive metabolic panel  Result Value Ref Range   Glucose 107 (H) 65 - 99 mg/dL   BUN 13 8 - 27 mg/dL   Creatinine, Ser 1.610.85 0.57 - 1.00 mg/dL   GFR calc non Af Amer 73 >59  mL/min/1.73   GFR calc Af Amer 84 >59 mL/min/1.73   BUN/Creatinine Ratio 15 11 - 26   Sodium 143 134 - 144 mmol/L   Potassium 4.8 3.5 - 5.2 mmol/L   Chloride 99 97 - 108 mmol/L   CO2 28 18 - 29 mmol/L   Calcium 9.5 8.7 - 10.3 mg/dL   Total Protein 6.7 6.0 - 8.5 g/dL   Albumin 4.3 3.6 - 4.8 g/dL   Globulin, Total 2.4 1.5 - 4.5 g/dL   Albumin/Globulin Ratio 1.8 1.1 - 2.5   Bilirubin Total 0.4 0.0 - 1.2 mg/dL   Alkaline Phosphatase 50 39 - 117 IU/L   AST 20 0 - 40 IU/L   ALT 10 0 - 32 IU/L  Lipid Panel w/o Chol/HDL Ratio  Result Value Ref Range   Cholesterol, Total 134 100 - 199 mg/dL   Triglycerides 096129 0 - 149 mg/dL   HDL 35 (L) >04>39 mg/dL   VLDL Cholesterol Cal 26 5 - 40 mg/dL   LDL Calculated 73 0 - 99 mg/dL  Hgb V4UA1c w/o eAG  Result Value Ref Range   Hgb A1c MFr Bld 7.5 (H) 4.8 - 5.6 %      Assessment & Plan:   Problem List Items Addressed This Visit      Cardiovascular and Mediastinum   Hypertension    Try to follow DASH guidelines; limit salt; continue ACE-I and chlorthalidone; check creatinine today        Endocrine   Diabetes mellitus without complication (HCC) - Primary    Check A1c today; foot exam by MD today; eye exam next month      Relevant Orders   Hgb A1c w/o eAG   Microalbumin / creatinine urine ratio     Other   Obesity    Praise given for weight management      Hyperlipidemia    Check lipids today, goal HDL over 50; expect this to be better than last time      Relevant Orders   Lipid Panel w/o Chol/HDL Ratio   Vitamin D deficiency    Last vit D level 34.6; take 1000 iu vit D3 daily      Medication monitoring encounter   Relevant Orders   Comprehensive metabolic panel    Other Visit Diagnoses    Needs flu shot        [employer] personnel stopped her from getting flu shot; we tried to contact derm (prescriber of Humira), could not reach; she'll ask  him, come back for flu shot       Prev care: she declined hiv, hep C screening  Follow  up plan: Return in about 6 months (around 08/18/2015) for thirty minute follow-up with fasting labs.  Orders Placed This Encounter  Procedures  . Hgb A1c w/o eAG  . Lipid Panel w/o Chol/HDL Ratio  . Microalbumin / creatinine urine ratio  . Comprehensive metabolic panel   An after-visit summary was printed and given to the patient at check-out.  Please see the patient instructions which may contain other information and recommendations beyond what is mentioned above in the assessment and plan.

## 2015-02-18 NOTE — Assessment & Plan Note (Signed)
Check A1c today; foot exam by MD today; eye exam next month

## 2015-02-18 NOTE — Patient Instructions (Addendum)
I will recommend stopping use of straws Check feet every night Ask your eye doctor to please send us copy of his/her note Try to limit saturated fats in your diet (bologna, hot dogs, barbeque, cheeseburgers, hamburgers, steak, bacon, sausage, cheese, etc.) and get more fresh fruits, vegetables, and whole grains Check with dermatologist today and find out if it's fine for you to get the flu vaccine; you are welcome to return for that today or tomorrow We'll let you know about the labs Return in 6 monthsDASH Eating Plan DASH stands for "Dietary Approaches to Stop Hypertension." The DASH eating plan is a healthy eating plan that has been shown to reduce high blood pressure (hypertension). Additional health benefits may include reducing the risk of type 2 diabetes mellitus, heart disease, and stroke. The DASH eating plan may also help with weight loss. WHAT DO I NEED TO KNOW ABOUT THE DASH EATING PLAN? For the DASH eating plan, you will follow these general guidelines:  Choose foods with a percent daily value for sodium of less than 5% (as listed on the food label).  Use salt-free seasonings or herbs instead of table salt or sea salt.  Check with your health care provider or pharmacist before using salt substitutes.  Eat lower-sodium products, often labeled as "lower sodium" or "no salt added."  Eat fresh foods.  Eat more vegetables, fruits, and low-fat dairy products.  Choose whole grains. Look for the word "whole" as the first word in the ingredient list.  Choose fish and skinless chicken or [place] more often than red meat. Limit fish, poultry, and meat to 6 oz (170 g) each day.  Limit sweets, desserts, sugars, and sugary drinks.  Choose heart-healthy fats.  Limit cheese to 1 oz (28 g) per day.  Eat more home-cooked food and less restaurant, buffet, and fast food.  Limit fried foods.  Cook foods using methods other than frying.  Limit canned vegetables. If you do use them, rinse  them well to decrease the sodium.  When eating at a restaurant, ask that your food be prepared with less salt, or no salt if possible. WHAT FOODS CAN I EAT? Seek help from a dietitian for individual calorie needs. Grains Whole grain or whole wheat bread. Brown rice. Whole grain or whole wheat pasta. Quinoa, bulgur, and whole grain cereals. Low-sodium cereals. Corn or whole wheat flour tortillas. Whole grain cornbread. Whole grain crackers. Low-sodium crackers. Vegetables Fresh or frozen vegetables (raw, steamed, roasted, or grilled). Low-sodium or reduced-sodium tomato and vegetable juices. Low-sodium or reduced-sodium tomato sauce and paste. Low-sodium or reduced-sodium canned vegetables.  Fruits All fresh, canned (in natural juice), or frozen fruits. Meat and Other Protein Products Ground beef (85% or leaner), grass-fed beef, or beef trimmed of fat. Skinless chicken or [place]. Ground chicken or [place]. Pork trimmed of fat. All fish and seafood. Eggs. Dried beans, peas, or lentils. Unsalted nuts and seeds. Unsalted canned beans. Dairy Low-fat dairy products, such as skim or 1% milk, 2% or reduced-fat cheeses, low-fat ricotta or cottage cheese, or plain low-fat yogurt. Low-sodium or reduced-sodium cheeses. Fats and Oils Tub margarines without trans fats. Light or reduced-fat mayonnaise and salad dressings (reduced sodium). Avocado. Safflower, olive, or canola oils. Natural peanut or almond butter. Other Unsalted popcorn and pretzels. The items listed above may not be a complete list of recommended foods or beverages. Contact your dietitian for more options. WHAT FOODS ARE NOT RECOMMENDED? Grains White bread. White pasta. White rice. Refined cornbread. Bagels and croissants. Crackers  that contain trans fat. Vegetables Creamed or fried vegetables. Vegetables in a cheese sauce. Regular canned vegetables. Regular canned tomato sauce and paste. Regular tomato and vegetable juices. Fruits Dried  fruits. Canned fruit in light or heavy syrup. Fruit juice. Meat and Other Protein Products Fatty cuts of meat. Ribs, chicken wings, bacon, sausage, bologna, salami, chitterlings, fatback, hot dogs, bratwurst, and packaged luncheon meats. Salted nuts and seeds. Canned beans with salt. Dairy Whole or 2% milk, cream, half-and-half, and cream cheese. Whole-fat or sweetened yogurt. Full-fat cheeses or blue cheese. Nondairy creamers and whipped toppings. Processed cheese, cheese spreads, or cheese curds. Condiments Onion and garlic salt, seasoned salt, table salt, and sea salt. Canned and packaged gravies. Worcestershire sauce. Tartar sauce. Barbecue sauce. Teriyaki sauce. Soy sauce, including reduced sodium. Steak sauce. Fish sauce. Oyster sauce. Cocktail sauce. Horseradish. Ketchup and mustard. Meat flavorings and tenderizers. Bouillon cubes. Hot sauce. Tabasco sauce. Marinades. Taco seasonings. Relishes. Fats and Oils Butter, stick margarine, lard, shortening, ghee, and bacon fat. Coconut, palm kernel, or palm oils. Regular salad dressings. Other Pickles and olives. Salted popcorn and pretzels. The items listed above may not be a complete list of foods and beverages to avoid. Contact your dietitian for more information. WHERE CAN I FIND MORE INFORMATION? National Heart, Lung, and Blood Institute: [employer]   This information is not intended to replace advice given to you by your health care provider. Make sure you discuss any questions you have with your health care provider.   Document Released: 01/12/2011 Document Revised: 02/13/2014 Document Reviewed: 11/27/2012 Elsevier Interactive Patient Education 2016 Elsevier Inc. Cholesterol Cholesterol is a fat. Your body needs a small amount of cholesterol. Cholesterol may build up in your blood vessels. This increases your chance of having a heart attack or stroke. You cannot feel your cholesterol levels. The  only way to know your cholesterol level is high is with a blood test. Keep your test results. Work with your doctor to keep your cholesterol at a good level. WHAT DO THE TEST RESULTS MEAN?  Total cholesterol is how much cholesterol is in your blood.  LDL is bad cholesterol. This is the type that can build up. You want LDL to be low.  HDL is good cholesterol. It cleans your blood vessels and carries LDL away. You want HDL to be high.  Triglycerides are fat that the body can burn for energy or store. WHAT ARE GOOD LEVELS OF CHOLESTEROL?  Total cholesterol below 200.  LDL below 100 for people at risk. Below 70 for those at very high risk.  HDL above 50 is good. Above 60 is best.  Triglycerides below 150. HOW CAN I LOWER MY CHOLESTEROL?  Diet. Follow your diet programs as told by your doctor.  Choose fish, white meat chicken, roasted [place], or baked [place]. Try not to eat red meat, fried foods, or processed meats such as sausage and lunch meats.  Eat lots of fresh fruits and vegetables.  Choose whole grains, beans, pasta, potatoes, and cereals.  Use only small amounts of olive, corn, or canola oils.  Try not to eat butter, mayonnaise, shortening, or palm kernel oils.  Try not to eat foods with trans fats.  Drink skim or nonfat milk. Eat low-fat or nonfat yogurt and cheeses. Try not to drink whole milk or cream. Try not to eat ice cream, egg yolks, and full-fat cheeses.  Healthy desserts include angel food cake, ginger snaps, animal crackers, hard candy, popsicles, and low-fat or nonfat frozen yogurt. Try  not to eat pastries, cakes, pies, and cookies.  Exercise. Follow your exercise programs as told by your doctor.  Be more active. You can try gardening, walking, or taking the stairs. Ask your doctor about how you can be more active.  Medicine. Take medicine as told by your doctor.   This information is not intended to replace advice given to you by your health care provider.  Make sure you discuss any questions you have with your health care provider.   Document Released: 04/21/2008 Document Revised: 02/13/2014 Document Reviewed: 11/06/2012 Elsevier Interactive Patient Education [employer]

## 2015-02-18 NOTE — Assessment & Plan Note (Signed)
Check lipids today, goal HDL over 50; expect this to be better than last time

## 2015-02-18 NOTE — Assessment & Plan Note (Signed)
Praise given for weight management

## 2015-02-18 NOTE — Assessment & Plan Note (Signed)
Try to follow DASH guidelines; limit salt; continue ACE-I and chlorthalidone; check creatinine today

## 2015-02-18 NOTE — Assessment & Plan Note (Signed)
Last vit D level 34.6; take 1000 iu vit D3 daily

## 2015-02-19 ENCOUNTER — Encounter: Payer: Self-pay | Admitting: Family Medicine

## 2015-02-19 LAB — COMPREHENSIVE METABOLIC PANEL
ALBUMIN: 4.4 g/dL (ref 3.6–4.8)
ALT: 16 IU/L (ref 0–32)
AST: 26 IU/L (ref 0–40)
Albumin/Globulin Ratio: 1.8 (ref 1.1–2.5)
Alkaline Phosphatase: 52 IU/L (ref 39–117)
BUN/Creatinine Ratio: 17 (ref 11–26)
BUN: 15 mg/dL (ref 8–27)
Bilirubin Total: 0.3 mg/dL (ref 0.0–1.2)
CALCIUM: 9.7 mg/dL (ref 8.7–10.3)
CHLORIDE: 101 mmol/L (ref 96–106)
CO2: 25 mmol/L (ref 18–29)
Creatinine, Ser: 0.9 mg/dL (ref 0.57–1.00)
GFR calc non Af Amer: 68 mL/min/{1.73_m2} (ref >59)
GFR, EST AFRICAN AMERICAN: 78 mL/min/{1.73_m2} (ref >59)
GLUCOSE: 112 mg/dL — AB (ref 65–99)
Globulin, Total: 2.4 g/dL (ref 1.5–4.5)
POTASSIUM: 4.5 mmol/L (ref 3.5–5.2)
Sodium: 141 mmol/L (ref 134–144)
TOTAL PROTEIN: 6.8 g/dL (ref 6.0–8.5)

## 2015-02-19 LAB — LIPID PANEL W/O CHOL/HDL RATIO
Cholesterol, Total: 152 mg/dL (ref 100–199)
HDL: 40 mg/dL (ref >39)
LDL CALC: 84 mg/dL (ref 0–99)
Triglycerides: 138 mg/dL (ref 0–149)
VLDL Cholesterol Cal: 28 mg/dL (ref 5–40)

## 2015-02-19 LAB — HGB A1C W/O EAG: Hgb A1c MFr Bld: 7.2 % — ABNORMAL HIGH (ref 4.8–5.6)

## 2015-02-28 ENCOUNTER — Other Ambulatory Visit: Payer: Self-pay | Admitting: Family Medicine

## 2015-02-28 MED ORDER — LISINOPRIL 10 MG PO TABS: 10.0000 mg | ORAL_TABLET | Freq: Every day | ORAL | Status: DC

## 2015-02-28 MED ORDER — ATORVASTATIN CALCIUM 40 MG PO TABS: 40.0000 mg | ORAL_TABLET | Freq: Every day | ORAL | Status: DC

## 2015-02-28 NOTE — Telephone Encounter (Signed)
January labs (ALT, lipids, Cr, K+) reviewed Rxs approved

## 2015-02-28 NOTE — Addendum Note (Signed)
Addended by: Mikalyn Hermida, Janit Bern on: 02/28/2015 04:35 PM   Modules accepted: Orders

## 2015-02-28 NOTE — Telephone Encounter (Signed)
Transmission failed; sending again

## 2015-04-12 ENCOUNTER — Other Ambulatory Visit: Payer: Self-pay | Admitting: Family Medicine

## 2015-04-12 NOTE — Telephone Encounter (Signed)
Creatinine from Jan 2017 reviewed; Rx approved

## 2015-04-20 ENCOUNTER — Telehealth: Payer: Self-pay | Admitting: Family Medicine

## 2015-04-20 NOTE — Telephone Encounter (Signed)
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

## 2015-04-21 NOTE — Telephone Encounter (Signed)
Left message to call.

## 2015-04-21 NOTE — Telephone Encounter (Signed)
She wanted to let us know that Dr. Archie BalboaJolly had said she had psoriasis and was having her do injections for the past year. When Dr. Archie BalboaJolly left, she started seeing Dr. Adolphus Birchwoodasher and he did a biopsy and found out that she does not have psoriasis, just a fungus and she had it treated. I discontinued the Humira from her med list.

## 2015-07-01 ENCOUNTER — Telehealth: Payer: Self-pay | Admitting: Family Medicine

## 2015-07-01 DIAGNOSIS — H9392 Unspecified disorder of left ear: Secondary | ICD-10-CM | POA: Insufficient documentation

## 2015-07-01 NOTE — Telephone Encounter (Signed)
Done

## 2015-07-01 NOTE — Telephone Encounter (Signed)
Patient is requesting a referral to [facility] to see Dr Andee PolesVaught. Having trouble with left ear and have discussed this with you before.

## 2015-07-06 ENCOUNTER — Ambulatory Visit: Payer: 59 | Admitting: Family Medicine

## 2015-08-19 ENCOUNTER — Ambulatory Visit: Payer: 59 | Admitting: Family Medicine

## 2015-08-19 ENCOUNTER — Encounter: Payer: Self-pay | Admitting: Family Medicine

## 2015-08-19 ENCOUNTER — Ambulatory Visit (INDEPENDENT_AMBULATORY_CARE_PROVIDER_SITE_OTHER): Payer: 59 | Admitting: Family Medicine

## 2015-08-19 VITALS — BP 128/78 | HR 63 | Temp 98.4°F | Resp 18 | Ht 64.0 in | Wt 176.1 lb

## 2015-08-19 DIAGNOSIS — Z5181 Encounter for therapeutic drug level monitoring: Secondary | ICD-10-CM | POA: Diagnosis not present

## 2015-08-19 DIAGNOSIS — E119 Type 2 diabetes mellitus without complications: Secondary | ICD-10-CM | POA: Diagnosis not present

## 2015-08-19 DIAGNOSIS — I10 Essential (primary) hypertension: Secondary | ICD-10-CM

## 2015-08-19 DIAGNOSIS — E785 Hyperlipidemia, unspecified: Secondary | ICD-10-CM | POA: Diagnosis not present

## 2015-08-19 MED ORDER — METFORMIN HCL 500 MG PO TABS: 500.0000 mg | ORAL_TABLET | Freq: Two times a day (BID) | ORAL | Status: DC

## 2015-08-19 NOTE — Assessment & Plan Note (Addendum)
Check lipids; continue statin; goal LDL less than 100, goal HDL over 50

## 2015-08-19 NOTE — Assessment & Plan Note (Signed)
Check cmp 

## 2015-08-19 NOTE — Patient Instructions (Signed)
Please do see your eye doctor regularly, and have your eyes examined every year (or more often per his or her recommendation) Check your feet every night and let me know right away of any sores, infections, numbness, etc. Try to limit sweets, white bread, white rice, white potatoes It is okay with me for you to not check your fingerstick blood sugars (per [employer] Best Practices), unless you are interested and feel it would be helpful for you We'll get labs today Return in 6 months

## 2015-08-19 NOTE — Progress Notes (Signed)
BP 128/78 mmHg  Pulse 63  Temp(Src) 98.4 F (36.9 C)  Resp 18  Ht 5\' 4"  (1.626 m)  Wt 176 lb 1 oz (79.861 kg)  BMI 30.21 kg/m2  SpO2 96%   Subjective:    Patient ID: Sandy Salinas, female    DOB: [birth year], 65 y.o.   MRN: [id number]  HPI: Sandy Salinas is a 65 y.o. female  Chief Complaint  Patient presents with  . fasting labs    6 month follow up and lab work   Patient is here for 6 month f/u of her diabetes, other health issues; she is well known to me from previous practice She says her blood sugars are dropping in the mornings after breakfast It turns out that review of her medicines shows that she is actually taking 1000 mg of SHORT-acting metformin at night before she goes to bed, not BID with meals (teaching given to take prior to meals) Not many carbs; grits and bacon or bagel with cream cheese, something, not much Not drinking coffee; drinking unsweetened tea without caffeine No problems with feet She denies shortness of breath, blurred vision, urinary frequency She has high cholesterol; on statin; no problems voiced  Relevant past medical, surgical, family and social history reviewed Past Medical History  Diagnosis Date  . Diabetes mellitus without complication (HCC)   . Hyperlipidemia   . Hypertension   . Obesity   . Primary osteoarthritis of left knee    Past Surgical History  Procedure Laterality Date  . Tonsillectomy  1970  . Abdominal hysterectomy  1975    total  . Cystectomy  2003  . Roux-en-y gastric bypass  2009   Family History  Problem Relation Age of Onset  . Alzheimer's disease Mother   . Heart murmur Mother   . Dementia Mother   . AAA (abdominal aortic aneurysm) Father   . Heart disease Father   . Asthma Sister   . Hypertension Sister   . Cancer Sister   . Heart disease Brother   . Hypertension Brother    Social History  Substance Use Topics  . Smoking status: Never Smoker   . Smokeless tobacco: Never Used  . Alcohol  Use: No   Interim medical history since last visit reviewed. Allergies and medications reviewed  Review of Systems Per HPI unless specifically indicated above     Objective:    BP 128/78 mmHg  Pulse 63  Temp(Src) 98.4 F (36.9 C)  Resp 18  Ht 5\' 4"  (1.626 m)  Wt 176 lb 1 oz (79.861 kg)  BMI 30.21 kg/m2  SpO2 96%  Wt Readings from Last 3 Encounters:  08/19/15 176 lb 1 oz (79.861 kg)  02/18/15 175 lb (79.379 kg)  12/14/14 178 lb (80.74 kg)    Physical Exam  Constitutional: She appears well-developed and well-nourished. No distress.  Obese, weight stable  HENT:  Head: Normocephalic and atraumatic.  Eyes: EOM are normal. No scleral icterus.  Neck: No thyromegaly present.  Cardiovascular: Normal rate, regular rhythm and normal heart sounds.   No murmur heard. Pulmonary/Chest: Effort normal and breath sounds normal. No respiratory distress. She has no wheezes.  Abdominal: Soft. Bowel sounds are normal. She exhibits no distension.  Musculoskeletal: Normal range of motion. She exhibits no edema.  Neurological: She is alert.  Skin: Skin is warm and dry. She is not diaphoretic. No pallor.  Psychiatric: She has a normal mood and affect. Her behavior is normal. Judgment and thought content normal.  Diabetic Foot Form - Detailed   Diabetic Foot Exam - detailed  Diabetic Foot exam was performed with the following findings:  Yes 08/19/2015 12:15 PM  Visual Foot Exam completed.:  Yes  Are the toenails long?:  No  Are the toenails thick?:  No  Are the toenails ingrown?:  No  Normal Range of Motion:  Yes    Pulse Foot Exam completed.:  Yes  Right Dorsalis Pedis:  Present Left Dorsalis Pedis:  Present  Sensory Foot Exam Completed.:  Yes  Swelling:  No  Semmes-Weinstein Monofilament Test  R Site 1-Great Toe:  Pos L Site 1-Great Toe:  Pos  R Site 4:  Pos L Site 4:  Pos  R Site 5:  Pos L Site 5:  Pos        Results for orders placed or performed in visit on 02/18/15  Hgb A1c  w/o eAG  Result Value Ref Range   Hgb A1c MFr Bld 7.2 (H) 4.8 - 5.6 %  Lipid Panel w/o Chol/HDL Ratio  Result Value Ref Range   Cholesterol, Total 152 100 - 199 mg/dL   Triglycerides 161 0 - 149 mg/dL   HDL 40 >09 mg/dL   VLDL Cholesterol Cal 28 5 - 40 mg/dL   LDL Calculated 84 0 - 99 mg/dL  Comprehensive metabolic panel  Result Value Ref Range   Glucose 112 (H) 65 - 99 mg/dL   BUN 15 8 - 27 mg/dL   Creatinine, Ser 6.04 0.57 - 1.00 mg/dL   GFR calc non Af Amer 68 >59 mL/min/1.73   GFR calc Af Amer 78 >59 mL/min/1.73   BUN/Creatinine Ratio 17 11 - 26   Sodium 141 134 - 144 mmol/L   Potassium 4.5 3.5 - 5.2 mmol/L   Chloride 101 96 - 106 mmol/L   CO2 25 18 - 29 mmol/L   Calcium 9.7 8.7 - 10.3 mg/dL   Total Protein 6.8 6.0 - 8.5 g/dL   Albumin 4.4 3.6 - 4.8 g/dL   Globulin, Total 2.4 1.5 - 4.5 g/dL   Albumin/Globulin Ratio 1.8 1.1 - 2.5   Bilirubin Total 0.3 0.0 - 1.2 mg/dL   Alkaline Phosphatase 52 39 - 117 IU/L   AST 26 0 - 40 IU/L   ALT 16 0 - 32 IU/L  Microalbumin, Urine Waived  Result Value Ref Range   Microalb, Ur Waived 10 0 - 19 mg/L   Creatinine, Urine Waived 50 10 - 300 mg/dL   Microalb/Creat Ratio <30 <30 mg/g      Assessment & Plan:   Problem List Items Addressed This Visit      Cardiovascular and Mediastinum   Hypertension    Controlled today        Endocrine   Diabetes mellitus without complication (HCC) - Primary    Check A1c today; urine microalbumin UTD, normal; encoruaged healthy eating; she will see eye doctor soon; foot exam by MD today; teaching given about proper use of short-acting metformin      Relevant Medications   metFORMIN (GLUCOPHAGE) 500 MG tablet   Other Relevant Orders   Hemoglobin A1c     Other   Medication monitoring encounter    Check cmp      Relevant Orders   Comprehensive metabolic panel   Hyperlipidemia    Check lipids; continue statin; goal LDL less than 100, goal HDL over 50      Relevant Orders   Lipid panel        Follow  up plan: Return in about 6 months (around 02/19/2016) for diabetes; Welcome to Medicare visit separately when due.  An after-visit summary was printed and given to the patient at check-out.  Please see the patient instructions which may contain other information and recommendations beyond what is mentioned above in the assessment and plan.  Meds ordered this encounter  Medications  . metFORMIN (GLUCOPHAGE) 500 MG tablet    Sig: Take 1 tablet (500 mg total) by mouth 2 (two) times daily with a meal.    Dispense:  180 tablet    Refill:  1    Orders Placed This Encounter  Procedures  . Hemoglobin A1c  . Comprehensive metabolic panel  . Lipid panel

## 2015-08-19 NOTE — Assessment & Plan Note (Signed)
Controlled today 

## 2015-08-19 NOTE — Assessment & Plan Note (Addendum)
Check A1c today; urine microalbumin UTD, normal; encoruaged healthy eating; she will see eye doctor soon; foot exam by MD today; teaching given about proper use of short-acting metformin

## 2015-08-20 LAB — HEMOGLOBIN A1C
Hgb A1c MFr Bld: 7.1 % — ABNORMAL HIGH (ref <5.7)
Mean Plasma Glucose: 157 mg/dL

## 2015-08-20 LAB — LIPID PANEL
CHOLESTEROL: 134 mg/dL (ref 125–200)
HDL: 40 mg/dL — AB (ref >=46)
LDL CALC: 68 mg/dL (ref <130)
TRIGLYCERIDES: 128 mg/dL (ref <150)
Total CHOL/HDL Ratio: 3.4 Ratio (ref <=5.0)
VLDL: 26 mg/dL (ref <30)

## 2015-08-20 LAB — COMPREHENSIVE METABOLIC PANEL
ALBUMIN: 4.4 g/dL (ref 3.6–5.1)
ALT: 10 U/L (ref 6–29)
AST: 20 U/L (ref 10–35)
Alkaline Phosphatase: 54 U/L (ref 33–130)
BILIRUBIN TOTAL: 0.5 mg/dL (ref 0.2–1.2)
BUN: 14 mg/dL (ref 7–25)
CALCIUM: 9.4 mg/dL (ref 8.6–10.4)
CHLORIDE: 105 mmol/L (ref 98–110)
CO2: 28 mmol/L (ref 20–31)
Creat: 0.79 mg/dL (ref 0.50–0.99)
Glucose, Bld: 100 mg/dL — ABNORMAL HIGH (ref 65–99)
Potassium: 5 mmol/L (ref 3.5–5.3)
Sodium: 142 mmol/L (ref 135–146)
TOTAL PROTEIN: 7.2 g/dL (ref 6.1–8.1)

## 2015-08-22 ENCOUNTER — Other Ambulatory Visit: Payer: Self-pay | Admitting: Family Medicine

## 2015-08-22 NOTE — Telephone Encounter (Signed)
Last Cr, K+, and SGPT reviewed; Rxs approved

## 2015-10-14 ENCOUNTER — Encounter: Payer: Self-pay | Admitting: Family Medicine

## 2015-10-14 ENCOUNTER — Ambulatory Visit (INDEPENDENT_AMBULATORY_CARE_PROVIDER_SITE_OTHER): Payer: 59 | Admitting: Family Medicine

## 2015-10-14 ENCOUNTER — Ambulatory Visit
Admission: RE | Admit: 2015-10-14 | Discharge: 2015-10-14 | Disposition: A | Discharge status: Home / Self Care | Payer: 59 | Source: Ambulatory Visit | Attending: Family Medicine | Admitting: Family Medicine

## 2015-10-14 VITALS — BP 140/60 | HR 65 | Temp 98.1°F | Wt 178.0 lb

## 2015-10-14 DIAGNOSIS — M5416 Radiculopathy, lumbar region: Secondary | ICD-10-CM

## 2015-10-14 DIAGNOSIS — Z23 Encounter for immunization: Secondary | ICD-10-CM

## 2015-10-14 MED ORDER — TRAMADOL HCL 50 MG PO TABS: 50.0000 mg | ORAL_TABLET | Freq: Four times a day (QID) | ORAL | 0 refills | Status: DC | PRN

## 2015-10-14 NOTE — Progress Notes (Signed)
BP 140/60   Pulse 65   Temp 98.1 F (36.7 C)   Wt 178 lb (80.7 kg)   SpO2 95%   BMI 30.55 kg/m    Subjective:    Patient ID: Sandy Salinas, female    DOB: March 09, 1950, 65 y.o.   MRN: [id number]  HPI: Sandy Salinas is a 65 y.o. female  Chief Complaint  Patient presents with  . 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  Relevant past medical, surgical, family and social history reviewed Past Medical History:  Diagnosis Date  . Diabetes mellitus without complication (HCC)   . Hyperlipidemia   . Hypertension   . Obesity   . Primary osteoarthritis of left knee    Past Surgical History:  Procedure Laterality Date  . ABDOMINAL HYSTERECTOMY  1975   total  . CYSTECTOMY  2003  . ROUX-EN-Y GASTRIC BYPASS  2009  . TONSILLECTOMY  1970   Family History  Problem Relation Age of Onset  . Alzheimer's disease Mother   . Heart murmur Mother   . Dementia Mother   . AAA (abdominal aortic aneurysm) Father   . Heart disease Father   . Asthma Sister   . Hypertension Sister   . Cancer Sister   . Heart disease Brother   . Hypertension Brother    Social History  Substance Use Topics  . Smoking status: Never Smoker  . Smokeless tobacco: Never Used  . Alcohol use No    Interim medical history since last visit reviewed. Allergies and medications reviewed  Review of Systems Per HPI unless specifically indicated above     Objective:    BP 140/60   Pulse 65   Temp 98.1 F (36.7 C)   Wt 178 lb (80.7 kg)    SpO2 95%   BMI 30.55 kg/m   Wt Readings from Last 3 Encounters:  10/14/15 178 lb (80.7 kg)  08/19/15 176 lb 1 oz (79.9 kg)  02/18/15 175 lb (79.4 kg)    Physical Exam  Constitutional: She appears well-developed and well-nourished. No distress.  Eyes: No scleral icterus.  Cardiovascular: Normal rate.   Pulmonary/Chest: Effort normal.  Abdominal: She exhibits no distension.  Musculoskeletal:       Lumbar back: She exhibits decreased range of motion and tenderness. She exhibits no bony tenderness, no swelling, no edema, no deformity and no spasm.  Tenderness left of midline; tender to palpation over the left SI joint as well; discomfort with rotation at the waist plus extension  Neurological:  Reflex Scores:      Patellar reflexes are 2+ on the right side and 2+ on the left side. LE strength 5/5; plantarflexion, dorsiflexion 5/5  Skin: No rash noted. No pallor.  No rash to suggest shingles  Psychiatric: She has a normal mood and affect. Her behavior is normal. Judgment and thought content normal. Her mood appears not anxious. She does not exhibit a depressed  mood.   Diabetic Foot Form - Detailed   Diabetic Foot Exam - detailed Diabetic Foot exam was performed with the following findings:  Yes 10/14/2015 10:13 AM  Visual Foot Exam completed.:  Yes  Are the toenails ingrown?:  No Normal Range of Motion:  Yes Pulse Foot Exam completed.:  Yes  Right Dorsalis Pedis:  Present Left Dorsalis Pedis:  Present  Sensory Foot Exam Completed.:  Yes Semmes-Weinstein Monofilament Test R Site 1-Great Toe:  Pos L Site 1-Great Toe:  Pos  R Site 4:  Pos L Site 4:  Pos  R Site 5:  Pos L Site 5:  Pos       Results for orders placed or performed in visit on 08/19/15  Hemoglobin A1c  Result Value Ref Range   Hgb A1c MFr Bld 7.1 (H) <5.7 %   Mean Plasma Glucose 157 mg/dL  Comprehensive metabolic panel  Result Value Ref Range   Sodium 142 135 - 146 mmol/L   Potassium 5.0 3.5 - 5.3 mmol/L    Chloride 105 98 - 110 mmol/L   CO2 28 20 - 31 mmol/L   Glucose, Bld 100 (H) 65 - 99 mg/dL   BUN 14 7 - 25 mg/dL   Creat 1.61 0.96 - 0.45 mg/dL   Total Bilirubin 0.5 0.2 - 1.2 mg/dL   Alkaline Phosphatase 54 33 - 130 U/L   AST 20 10 - 35 U/L   ALT 10 6 - 29 U/L   Total Protein 7.2 6.1 - 8.1 g/dL   Albumin 4.4 3.6 - 5.1 g/dL   Calcium 9.4 8.6 - 40.9 mg/dL  Lipid panel  Result Value Ref Range   Cholesterol 134 125 - 200 mg/dL   Triglycerides 811 <914 mg/dL   HDL 40 (L) >=78 mg/dL   Total CHOL/HDL Ratio 3.4 <=5.0 Ratio   VLDL 26 <30 mg/dL   LDL Cholesterol 68 <295 mg/dL      Assessment & Plan:   Problem List Items Addressed This Visit      Nervous and Auditory   Lumbar radiculopathy, acute - Primary    Suspect L3-L4 nerve root impingement on the left; will get xray; suspect some degree of facet arthropathy; cannot take NSAIDs with hx of gastric bypass; use tylenol and tramadol, xray, physical therapy; check urine      Relevant Medications   traMADol (ULTRAM) 50 MG tablet   Other Relevant Orders   Ambulatory referral to Physical Therapy   DG Lumbar Spine Complete   Urinalysis w microscopic + reflex cultur    Other Visit Diagnoses    Needs flu shot       Relevant Orders   Flu Vaccine QUAD 36+ mos PF IM (Fluarix & Fluzone Quad PF) (Completed)      Follow up plan: No Follow-up on file.  An after-visit summary was printed and given to the patient at check-out.  Please see the patient instructions which may contain other information and recommendations beyond what is mentioned above in the assessment and plan.  Meds ordered this encounter  Medications  . traMADol (ULTRAM) 50 MG tablet    Sig: Take 1 tablet (50 mg total) by mouth every 6 (six) hours as needed.    Dispense:  20 tablet    Refill:  0    Orders Placed This Encounter  Procedures  . DG Lumbar Spine Complete  . Flu Vaccine QUAD 36+ mos PF IM (Fluarix & Fluzone Quad PF)  . Urinalysis w microscopic + reflex  cultur  . Ambulatory referral to Physical Therapy

## 2015-10-14 NOTE — Assessment & Plan Note (Signed)
Suspect L3-L4 nerve root impingement on the left; will get xray; suspect some degree of facet arthropathy; cannot take NSAIDs with hx of gastric bypass; use tylenol and tramadol, xray, physical therapy; check urine

## 2015-10-14 NOTE — Patient Instructions (Addendum)
We'll get xrays today Use tylenol per package directions Okay to use tramadol when really hurting, don't mix with any alcohol or prescription sleeping pills We'll have you see physical therapy We'll get a urine today If you lose control of bowel or bladder, go to the ER If not improving, call me for further work-up You received the flu shot today; it should protect you against the flu virus over the coming months; it will take about two weeks for antibodies to develop; do try to stay away from hospitals, nursing homes, and daycares during peak flu season; taking extra vitamin C daily during flu season may help you avoid getting sick   Back Exercises The following exercises strengthen the muscles that help to support the back. They also help to keep the lower back flexible. Doing these exercises can help to prevent back pain or lessen existing pain. If you have back pain or discomfort, try doing these exercises 2-3 times each day or as told by your health care provider. When the pain goes away, do them once each day, but increase the number of times that you repeat the steps for each exercise (do more repetitions). If you do not have back pain or discomfort, do these exercises once each day or as told by your health care provider. EXERCISES Single Knee to Chest Repeat these steps 3-5 times for each leg: 1. Lie on your back on a firm bed or the floor with your legs extended. 2. Bring one knee to your chest. Your other leg should stay extended and in contact with the floor. 3. Hold your knee in place by grabbing your knee or thigh. 4. Pull on your knee until you feel a gentle stretch in your lower back. 5. Hold the stretch for 10-30 seconds. 6. Slowly release and straighten your leg. Pelvic Tilt Repeat these steps 5-10 times: 1. Lie on your back on a firm bed or the floor with your legs extended. 2. Bend your knees so they are pointing toward the ceiling and your feet are flat on the  floor. 3. Tighten your lower abdominal muscles to press your lower back against the floor. This motion will tilt your pelvis so your tailbone points up toward the ceiling instead of pointing to your feet or the floor. 4. With gentle tension and even breathing, hold this position for 5-10 seconds. Cat-Cow Repeat these steps until your lower back becomes more flexible: 1. Get into a hands-and-knees position on a firm surface. Keep your hands under your shoulders, and keep your knees under your hips. You may place padding under your knees for comfort. 2. Let your head hang down, and point your tailbone toward the floor so your lower back becomes rounded like the back of a cat. 3. Hold this position for 5 seconds. 4. Slowly lift your head and point your tailbone up toward the ceiling so your back forms a sagging arch like the back of a cow. 5. Hold this position for 5 seconds. Press-Ups Repeat these steps 5-10 times: 1. Lie on your abdomen (face-down) on the floor. 2. Place your palms near your head, about shoulder-width apart. 3. While you keep your back as relaxed as possible and keep your hips on the floor, slowly straighten your arms to raise the top half of your body and lift your shoulders. Do not use your back muscles to raise your upper torso. You may adjust the placement of your hands to make yourself more comfortable. 4. Hold this position  for 5 seconds while you keep your back relaxed. 5. Slowly return to lying flat on the floor. Bridges Repeat these steps 10 times: 1. Lie on your back on a firm surface. 2. Bend your knees so they are pointing toward the ceiling and your feet are flat on the floor. 3. Tighten your buttocks muscles and lift your buttocks off of the floor until your waist is at almost the same height as your knees. You should feel the muscles working in your buttocks and the back of your thighs. If you do not feel these muscles, slide your feet 1-2 inches farther away from  your buttocks. 4. Hold this position for 3-5 seconds. 5. Slowly lower your hips to the starting position, and allow your buttocks muscles to relax completely. If this exercise is too easy, try doing it with your arms crossed over your chest. Abdominal Crunches Repeat these steps 5-10 times: 1. Lie on your back on a firm bed or the floor with your legs extended. 2. Bend your knees so they are pointing toward the ceiling and your feet are flat on the floor. 3. Cross your arms over your chest. 4. Tip your chin slightly toward your chest without bending your neck. 5. Tighten your abdominal muscles and slowly raise your trunk (torso) high enough to lift your shoulder blades a tiny bit off of the floor. Avoid raising your torso higher than that, because it can put too much stress on your low back and it does not help to strengthen your abdominal muscles. 6. Slowly return to your starting position. Back Lifts Repeat these steps 5-10 times: 1. Lie on your abdomen (face-down) with your arms at your sides, and rest your forehead on the floor. 2. Tighten the muscles in your legs and your buttocks. 3. Slowly lift your chest off of the floor while you keep your hips pressed to the floor. Keep the back of your head in line with the curve in your back. Your eyes should be looking at the floor. 4. Hold this position for 3-5 seconds. 5. Slowly return to your starting position. SEEK MEDICAL CARE IF:  Your back pain or discomfort gets much worse when you do an exercise.  Your back pain or discomfort does not lessen within 2 hours after you exercise. If you have any of these problems, stop doing these exercises right away. Do not do them again unless your health care provider says that you can. SEEK IMMEDIATE MEDICAL CARE IF:  You develop sudden, severe back pain. If this happens, stop doing the exercises right away. Do not do them again unless your health care provider says that you can.   This information  is not intended to replace advice given to you by your health care provider. Make sure you discuss any questions you have with your health care provider.   Document Released: 03/02/2004 Document Revised: 10/14/2014 Document Reviewed: 03/19/2014 Elsevier Interactive Patient Education [employer]2016 Elsevier Inc.

## 2015-10-15 LAB — URINALYSIS W MICROSCOPIC + REFLEX CULTURE
Bacteria, UA: NONE SEEN [HPF]
Bilirubin Urine: NEGATIVE
CASTS: NONE SEEN [LPF]
CRYSTALS: NONE SEEN [HPF]
Glucose, UA: NEGATIVE
Hgb urine dipstick: NEGATIVE
KETONES UR: NEGATIVE
Leukocytes, UA: NEGATIVE
NITRITE: NEGATIVE
Protein, ur: NEGATIVE
SPECIFIC GRAVITY, URINE: 1.018 (ref 1.001–1.035)
WBC, UA: NONE SEEN WBC/HPF (ref <=5)
YEAST: NONE SEEN [HPF]
pH: 7 (ref 5.0–8.0)

## 2015-10-25 ENCOUNTER — Ambulatory Visit: Payer: 59 | Admitting: Physical Therapy

## 2015-10-27 ENCOUNTER — Encounter: Payer: Self-pay | Admitting: Physical Therapy

## 2015-10-27 ENCOUNTER — Ambulatory Visit: Payer: 59 | Attending: Family Medicine | Admitting: Physical Therapy

## 2015-10-27 DIAGNOSIS — R293 Abnormal posture: Secondary | ICD-10-CM | POA: Diagnosis present

## 2015-10-27 DIAGNOSIS — M5416 Radiculopathy, lumbar region: Secondary | ICD-10-CM

## 2015-10-27 DIAGNOSIS — M6281 Muscle weakness (generalized): Secondary | ICD-10-CM | POA: Insufficient documentation

## 2015-10-27 DIAGNOSIS — M5442 Lumbago with sciatica, left side: Secondary | ICD-10-CM

## 2015-10-27 NOTE — Therapy (Signed)
[facility] [address] [place], [place], [address] Phone: [phone]   Fax:  [phone]  Physical Therapy Evaluation  Patient Details  Name: Sandy Salinas MRN: [id number] Date of Birth: [birth year] Referring Provider: Kerman Passey, MD  Encounter Date: 10/27/2015      PT End of Session - 10/27/15 1544    Visit Number 1   Number of Visits 8   Date for PT Re-Evaluation 11/24/15   PT Start Time 1115   PT Stop Time 1159   PT Time Calculation (min) 44 min   Activity Tolerance Patient tolerated treatment well;Patient limited by pain   Behavior During Therapy [facility] for tasks assessed/performed      Past Medical History:  Diagnosis Date  . Diabetes mellitus without complication (HCC)   . Hyperlipidemia   . Hypertension   . Obesity   . Primary osteoarthritis of left knee     Past Surgical History:  Procedure Laterality Date  . ABDOMINAL HYSTERECTOMY  1975   total  . CYSTECTOMY  2003  . ROUX-EN-Y GASTRIC BYPASS  2009  . TONSILLECTOMY  1970    There were no vitals filed for this visit.       Subjective Assessment - 10/27/15 1531    Subjective Pt reports chronic back and left leg pain; states she has been dealing with pain for the last 10-15 years. 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 Pt states that her pain is pretty constantly a 7/10 on left low back and down through buttock and posterior left leg to her left knee. She states that she hasn't found anything that relieves her pain whether it be positioning or pain medication. States that she occasionally takes Tylenol but it doesn't help her pain. She has difficulty maintaining a sitting position for a long length of time and typically has to shift from side to side  to keep back pain manageable. Pt states that she does use a heating pad occasionally for pain relief with mild benefit reported. She denies N/T and bowel/bladder issues. Pt states that she has continued to be active and is able to do all lifting tasks and her goal for PT is primarily pain related.   Pertinent History 10-15 year hx of chronic low back and left leg pain.   Limitations Sitting;Lifting;Standing;Walking;House hold activities   Diagnostic tests x-ray: Anatomic alignment. Osteopenia. No vertebral compression deformity. No acute fracture. Mild narrowing of the L4-5 disc with anterior osteophytes. L5-S1 facet arthropathy   Patient Stated Goals pt would like to have decreased low back/LLE pain   Currently in Pain? Yes   Pain Score 7    Pain Location Back   Pain Orientation Left   Pain Descriptors / Indicators Constant   Pain Type Chronic pain   Pain Onset More than a month ago   Pain Frequency Constant          Objective:  Therapeutic Exercise: Hooklying piriformis stretch LLE 30 sec x1. Seated piriformis stretch LLE; pt unable to tolerate positioning without worsening of left sided LBP. Standing hamstring stretch LLE x30 sec. Cat/camel x10 with cueing for posture; pt with difficulty isolating thoracic/versus lumbar spine - requiring repeated verbal/tactile cueing.  Pt response for medical necessity: Pt presents with chronic hx  of severe LBP/LLE pain. She demonstrates increased sensitivity with respect to lumbar rotation and lateral flexion with L sided complaints. She is able to tolerate exercise program with no increase in pain with modification to piriformis stretch in hooklying.      PT Education - 10/27/15 1543    Education provided Yes   Education Details See pt instructions: piriformis stretch, hamstring stretch, cat/camel   Person(s) Educated Patient   Methods Explanation;Demonstration;Handout   Comprehension Verbalized understanding;Returned demonstration              PT Long Term Goals - 10/27/15 1722      PT LONG TERM GOAL #1   Title Pt will score <20% on Modified Oswestry to promote improved functional mobility   Baseline 10/27/15: 28%   Time 4   Period Weeks   Status New     PT LONG TERM GOAL #2   Title Pt will demonstrate improvement in LLE strength by 1/2 MMT grade to promote full body strength and decrease load on lower back.   Baseline MMT hip flexion L 4+ (painful limited), knee extension 4, knee flexion 4+ (pain limited), dorsiflexion 4 (pain limited)   Time 4   Period Weeks   Status New     PT LONG TERM GOAL #3   Title Pt will be able to tolerate her daily activities with <5/10 LBP to promote pain free function   Baseline pt currently with 7/10 constant LBP   Time 4   Period Days   Status New     PT LONG TERM GOAL #4   Title Pt will be able to perform lumbar R/L lateral flexion/rotation without an increase in left sided LBP so that she can perform ADLs without limitation   Baseline pt with acute exacerbation of LBP, LLE pain with lateral flexion/rotation   Time 4   Period Weeks   Status New               Plan - 10/27/15 1720    Clinical Impression Statement Pt is a pleasant 65 year old female referred to physical therapy for chronic history of low back and left leg pain. Pain: Current 7/10, At best 6/10, At worst 10/10. Aggravating factors: pt states no clear aggravating factors with general c/o of constant pain. Ease: Heating pad. Strength: MMT hip flexion R/L 5/4+ (painful in LLE), knee extension 4+/4, knee flexion 5/4+ (pain in hamstring; limited), dorsiflexion 4+/4, plantarflexion 5/5. ROM: Lumbar flexion WFL pain at end range, Lumbar extension WFL pain at end range limited to L lower back, no change in sx with repeated ext x10, Lateral flexion R/L [facility] but c/o of left sided pain bilaterally, Rotation R/L WFL c/o of left sided pain bilaterally. Muscle length: pt demonstrates limited mobility of L  hamstring/piriformis as compared to R with noted L sided pain with end range. Palpation: pt with moderate tenderness to palpation of L paraspinals; pt sensitive to CPA L2-L5 and sacrum R/L. Special Tests: Slump test (+) L, (-) R. SLR (-) bilaterally. Outcome Measures: 28% Moderate Disability.    Rehab Potential Fair   Clinical Impairments Affecting Rehab Potential Negative: chronic nature of LBP, daily work requirements   PT Frequency 2x / week   PT Duration 4 weeks   PT Treatment/Interventions ADLs/Self Care Home Management;Biofeedback;Cryotherapy;Electrical Stimulation;Moist Heat;Gait training;Stair training;Functional mobility training;Therapeutic activities;Therapeutic exercise;Balance training;Neuromuscular re-education;Patient/family education;Manual techniques;Passive range of motion;Dry needling   PT Next Visit Plan lumbar mobility, LE strengthening   PT Home Exercise Plan see patient  instructions   Consulted and Agree with Plan of Care Patient      Patient will benefit from skilled therapeutic intervention in order to improve the following deficits and impairments:  Decreased activity tolerance, Decreased mobility, Decreased strength, Increased muscle spasms, Impaired flexibility, Hypomobility, Improper body mechanics, Pain  Visit Diagnosis: Radiculopathy, lumbar region  Left-sided low back pain with left-sided sciatica  Muscle weakness (generalized)  Abnormal posture     Problem List Patient Active Problem List   Diagnosis Date Noted  . Lumbar radiculopathy, acute 10/14/2015  . Problem of left ear 07/01/2015  . Medication monitoring encounter 02/18/2015  . Hyperlipidemia   . Hypertension   . Primary osteoarthritis of left knee   . Diabetes mellitus without complication (HCC) 07/24/2014  . 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 Obesity 07/24/2014   Cammie McgeeMichael C Sherk, PT, DPT # [id number] Michaelyn BarterLaura Revonda Menter, SPT 10/28/2015, 7:47 PM  [facility] [address] [place], [place], [id number] Phone: [phone]   Fax:  [phone]  Name: Sandy Salinas MRN: [id number] Date of Birth: 12/03/1950

## 2015-11-01 ENCOUNTER — Encounter: Payer: 59 | Admitting: Physical Therapy

## 2015-11-01 LAB — HM DIABETES EYE EXAM

## 2015-11-03 ENCOUNTER — Ambulatory Visit: Payer: 59 | Admitting: Physical Therapy

## 2015-11-03 ENCOUNTER — Telehealth: Payer: Self-pay

## 2015-11-03 ENCOUNTER — Encounter: Payer: Self-pay | Admitting: Physical Therapy

## 2015-11-03 DIAGNOSIS — R293 Abnormal posture: Secondary | ICD-10-CM

## 2015-11-03 DIAGNOSIS — M5416 Radiculopathy, lumbar region: Secondary | ICD-10-CM

## 2015-11-03 DIAGNOSIS — M5442 Lumbago with sciatica, left side: Secondary | ICD-10-CM

## 2015-11-03 DIAGNOSIS — M6281 Muscle weakness (generalized): Secondary | ICD-10-CM

## 2015-11-03 MED ORDER — MELOXICAM 7.5 MG PO TABS: 7.5000 mg | ORAL_TABLET | Freq: Every day | ORAL | 1 refills | Status: DC

## 2015-11-03 NOTE — Telephone Encounter (Signed)
Patient notified

## 2015-11-03 NOTE — Telephone Encounter (Signed)
Patient called states has taken tramadol in past and tried it again but it makes her sick on stomach is there anything else you can give her?

## 2015-11-03 NOTE — Therapy (Signed)
[place] [facility] [address] [place], [place], [address] Phone: [phone]   Fax:  [phone]  Physical Therapy Treatment  Patient Details  Name: Sandy Salinas MRN: [id number] Date of Birth: 1950-09-02 Referring Provider: Kerman PasseyMelinda P Lada, MD  Encounter Date: 11/03/2015      PT End of Session - 11/03/15 1202    Visit Number 2   Number of Visits 8   Date for PT Re-Evaluation 11/24/15   PT Start Time 0943   PT Stop Time 1027   PT Time Calculation (min) 44 min   Activity Tolerance Patient tolerated treatment well;Patient limited by pain   Behavior During Therapy [facility] for tasks assessed/performed      Past Medical History:  Diagnosis Date  . Diabetes mellitus without complication (HCC)   . Hyperlipidemia   . Hypertension   . Obesity   . Primary osteoarthritis of left knee     Past Surgical History:  Procedure Laterality Date  . ABDOMINAL HYSTERECTOMY  1975   total  . CYSTECTOMY  2003  . ROUX-EN-Y GASTRIC BYPASS  2009  . TONSILLECTOMY  1970    There were no vitals filed for this visit.      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 States that it has stayed at close to 10/10 for the last week. She performed her exercises but they did not relieve any of her symptoms.   Pertinent History 10-15 year hx of chronic low back and left leg pain.   Limitations Sitting;Lifting;Standing;Walking;House hold activities   Diagnostic tests x-ray: Anatomic alignment. Osteopenia. No vertebral compression deformity. No acute fracture. Mild narrowing of the L4-5 disc with anterior osteophytes. L5-S1 facet arthropathy   Patient Stated Goals pt would like to have decreased low back/LLE pain   Currently in Pain? Yes   Pain Score 10-Worst pain ever   Pain Location Back   Pain Orientation Left   Pain Descriptors / Indicators Constant   Pain Type Chronic pain   Pain Onset More than a month ago   Pain Frequency Constant      Objective:  Therapeutic Exercise: Child's pose with L opening 1 minute (sustained stretch). Seated lumbar flexion with white theraball (dynamic stretch with 5 sec end range holds) x10 with L opening. Pelvic tilt in supine x10. Cat/camel x10 with instructions to limit posterior pelvic tilt range secondary to onset of low back pain.  Manual tx: STM to L paraspinals and QL. Ischemic compression of L paraspinals TP. Grade I-II mobilization sacrum, UPA L4-5 (2 bouts, 2 minutes ea) for pain relief.   Pt response for medical necessity: Pt arrives to PT appointment in severe low back pain. She tolerates low grade mobilization with no change in sx; but gets mild relief from STM of L paraspinals and QL. Pt with increase in pain with sustained child's pose stretch but able to tolerate more dynamic exercise with white theraball.      PT Education - 11/03/15 1201    Education provided Yes   Education Details HEP: child's pose, pelvic tilt   Person(s) Educated Patient   Methods Explanation;Demonstration;Handout   Comprehension Verbalized understanding;Returned demonstration             PT Long Term Goals - 10/27/15 1722      PT LONG TERM GOAL #1   Title  Pt will score <20% on Modified Oswestry to promote improved functional mobility   Baseline 10/27/15: 28%   Time 4   Period Weeks   Status New     PT LONG TERM GOAL #2   Title Pt will demonstrate improvement in LLE strength by 1/2 MMT grade to promote full body strength and decrease load on lower back.   Baseline MMT hip flexion L 4+ (painful limited), knee extension 4, knee flexion 4+ (pain limited), dorsiflexion 4 (pain limited)   Time 4   Period Weeks   Status New     PT LONG TERM GOAL #3   Title Pt will be able to tolerate her daily activities with <5/10 LBP to promote pain free function   Baseline pt  currently with 7/10 constant LBP   Time 4   Period Days   Status New     PT LONG TERM GOAL #4   Title Pt will be able to perform lumbar R/L lateral flexion/rotation without an increase in left sided LBP so that she can perform ADLs without limitation   Baseline pt with acute exacerbation of LBP, LLE pain with lateral flexion/rotation   Time 4   Period Weeks   Status New               Plan - 11/03/15 1203    Clinical Impression Statement Pt arrives in severe low back pain with sx localized to left lumbar/sacral region. Emphasis on pain relief, pt able to tolerate low grade mobilizations of sacrum and lower lumbar spine but no change in overall sx. Pt with recognizable decrease in low back pain following STM/ischemic compression of TP in L lumbar parasinals and QL. Pt with preference for lumbar lordosis when performing cat/camel with onset of moderate pain with posterior pelvic tilt; pt with no c/o of pelvic tilt in supine.   Rehab Potential Fair   Clinical Impairments Affecting Rehab Potential Negative: chronic nature of LBP, daily work requirements   PT Frequency 2x / week   PT Duration 4 weeks   PT Treatment/Interventions ADLs/Self Care Home Management;Biofeedback;Cryotherapy;Electrical Stimulation;Moist Heat;Gait training;Stair training;Functional mobility training;Therapeutic activities;Therapeutic exercise;Balance training;Neuromuscular re-education;Patient/family education;Manual techniques;Passive range of motion;Dry needling   PT Next Visit Plan lumbar mobility, LE strengthening, pain relief   PT Home Exercise Plan see patient instructions   Consulted and Agree with Plan of Care Patient      Patient will benefit from skilled therapeutic intervention in order to improve the following deficits and impairments:  Decreased activity tolerance, Decreased mobility, Decreased strength, Increased muscle spasms, Impaired flexibility, Hypomobility, Improper body mechanics, Pain  Visit  Diagnosis: Radiculopathy, lumbar region  Left-sided low back pain with left-sided sciatica  Muscle weakness (generalized)  Abnormal posture     Problem List Patient Active Problem List   Diagnosis Date Noted  . Lumbar radiculopathy, acute 10/14/2015  . Problem of left ear 07/01/2015  . Medication monitoring encounter 02/18/2015  . Hyperlipidemia   . Hypertension   . Primary osteoarthritis of left knee   . Diabetes mellitus without complication (HCC) 07/24/2014  . 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 Obesity 07/24/2014   Cammie Mcgee, PT, DPT # [phone] Vernona Rieger Lima Chillemi SPT 11/03/2015, 12:30 PM  [place] [facility] [address] [place], [place], [address] Phone: [phone]   Fax:  [phone]  Name: Sandy Salinas MRN: [id number] Date of Birth: [birth year]

## 2015-11-03 NOTE — Telephone Encounter (Signed)
Yes, I just sent in new Rx Please add tramadol to list of side effects; I took it off her med list; thank you

## 2015-11-08 ENCOUNTER — Encounter: Payer: 59 | Admitting: Physical Therapy

## 2015-11-10 ENCOUNTER — Encounter: Payer: 59 | Admitting: Physical Therapy

## 2015-11-15 ENCOUNTER — Ambulatory Visit: Payer: 59 | Attending: Family Medicine | Admitting: Physical Therapy

## 2015-11-15 ENCOUNTER — Encounter: Payer: Self-pay | Admitting: Physical Therapy

## 2015-11-15 DIAGNOSIS — R293 Abnormal posture: Secondary | ICD-10-CM | POA: Insufficient documentation

## 2015-11-15 DIAGNOSIS — M5442 Lumbago with sciatica, left side: Secondary | ICD-10-CM | POA: Diagnosis present

## 2015-11-15 DIAGNOSIS — M6281 Muscle weakness (generalized): Secondary | ICD-10-CM | POA: Insufficient documentation

## 2015-11-15 DIAGNOSIS — M5416 Radiculopathy, lumbar region: Secondary | ICD-10-CM | POA: Diagnosis not present

## 2015-11-15 DIAGNOSIS — G8929 Other chronic pain: Secondary | ICD-10-CM | POA: Insufficient documentation

## 2015-11-15 NOTE — Therapy (Signed)
[place] [facility] [address] [place], [place], [address] Phone: [phone]   Fax:  [phone]  Physical Therapy Treatment  Patient Details  Name: Sandy Salinas MRN: [id number] Date of Birth: 11/16/1950 Referring Provider: Kerman PasseyMelinda P Lada, MD  Encounter Date: 11/15/2015      PT End of Session - 11/15/15 1245    Visit Number 3   Number of Visits 8   Date for PT Re-Evaluation 11/24/15   PT Start Time 0931   PT Stop Time 1025   PT Time Calculation (min) 54 min   Activity Tolerance Patient tolerated treatment well;Patient limited by pain   Behavior During Therapy [facility] for tasks assessed/performed      Past Medical History:  Diagnosis Date  . Diabetes mellitus without complication (HCC)   . Hyperlipidemia   . Hypertension   . Obesity   . Primary osteoarthritis of left knee     Past Surgical History:  Procedure Laterality Date  . ABDOMINAL HYSTERECTOMY  1975   total  . CYSTECTOMY  2003  . ROUX-EN-Y GASTRIC BYPASS  2009  . TONSILLECTOMY  1970    There were no vitals filed for this visit.      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 Reports that she has been in 10/10 pain since that episode but has not slowed down her activity. Pt states that she took pain pills this morning due to the high degree of pain that she was in.    Pertinent History 10-15 year hx of chronic low back and left leg pain.   Limitations Sitting;Lifting;Standing;Walking;House hold activities   Diagnostic tests x-ray: Anatomic alignment. Osteopenia. No vertebral compression deformity. No acute fracture. Mild narrowing of the L4-5 disc with anterior osteophytes. L5-S1 facet arthropathy   Patient Stated Goals pt would like to have decreased low back/LLE pain   Currently in Pain? Yes   Pain Score 10-Worst  pain ever   Pain Location Back   Pain Orientation Left;Right;Lower   Pain Descriptors / Indicators Constant   Pain Type Chronic pain   Pain Frequency Constant      Objective:  Therapeutic Exercise: Lumbar flexion seated on plinth with green theraball R opening x 15. Lumbar AROM all planes.   Manual tx: Hamstring stretch R/L x 1 minute, knees to chest R/L x 1 minute. STM of L paraspinals with moderate TTP of L QL. Grade I-II mobilization of sacrum R/L (2 bouts, 1 minute).  Pt response for medical necessity: Pt arrives to PT session in severe pain. Following STM she experiences no LBP when in prone position with reoccurrence of pain sx upon arising; with mild pain noted at lower R lumbar paraspinals.        PT Long Term Goals - 10/27/15 1722      PT LONG TERM GOAL #1   Title Pt will score <20% on Modified Oswestry to promote improved functional mobility   Baseline 10/27/15: 28%   Time 4   Period Weeks   Status New     PT LONG TERM GOAL #2   Title Pt will demonstrate improvement in LLE strength by 1/2 MMT grade to promote full body strength and decrease load on lower back.   Baseline MMT hip flexion L 4+ (painful limited), knee extension 4, knee flexion 4+ (pain  limited), dorsiflexion 4 (pain limited)   Time 4   Period Weeks   Status New     PT LONG TERM GOAL #3   Title Pt will be able to tolerate her daily activities with <5/10 LBP to promote pain free function   Baseline pt currently with 7/10 constant LBP   Time 4   Period Days   Status New     PT LONG TERM GOAL #4   Title Pt will be able to perform lumbar R/L lateral flexion/rotation without an increase in left sided LBP so that she can perform ADLs without limitation   Baseline pt with acute exacerbation of LBP, LLE pain with lateral flexion/rotation   Time 4   Period Weeks   Status New            Plan - 11/15/15 1246    Clinical Impression Statement Pt begins session in severe low back pain after a fall  involving her client. The majority of her pain is localized to R paraspinals/QL. Her LBP is relieved with STM of affected musculature; no c/o of spasm during treatment. She experiences minimal pain upon rising and resumption of regular activity.  No swelling on brusinig noted in low back musculature.     Rehab Potential Fair   Clinical Impairments Affecting Rehab Potential Negative: chronic nature of LBP, daily work requirements   PT Frequency 2x / week   PT Duration 4 weeks   PT Treatment/Interventions ADLs/Self Care Home Management;Biofeedback;Cryotherapy;Electrical Stimulation;Moist Heat;Gait training;Stair training;Functional mobility training;Therapeutic activities;Therapeutic exercise;Balance training;Neuromuscular re-education;Patient/family education;Manual techniques;Passive range of motion;Dry needling   PT Next Visit Plan lumbar mobility, LE strengthening, pain relief   PT Home Exercise Plan see patient instructions   Consulted and Agree with Plan of Care Patient      Patient will benefit from skilled therapeutic intervention in order to improve the following deficits and impairments:  Decreased activity tolerance, Decreased mobility, Decreased strength, Increased muscle spasms, Impaired flexibility, Hypomobility, Improper body mechanics, Pain  Visit Diagnosis: Radiculopathy, lumbar region  Chronic left-sided low back pain with left-sided sciatica  Muscle weakness (generalized)  Abnormal posture     Problem List Patient Active Problem List   Diagnosis Date Noted  . Lumbar radiculopathy, acute 10/14/2015  . Problem of left ear 07/01/2015  . Medication monitoring encounter 02/18/2015  . Hyperlipidemia   . Hypertension   . Primary osteoarthritis of left knee   . Diabetes mellitus without complication (HCC) 07/24/2014  . 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 Obesity 07/24/2014   Cammie Mcgee, PT, DPT # [id number] Michaelyn Barter, SPT 11/15/2015, 5:47 PM  [facility] [address] [place], [place], [address] Phone: [phone]   Fax:  [phone]  Name: BELLAMARIE PFLUG MRN: [id number] Date of Birth: 1950/05/24

## 2015-11-17 ENCOUNTER — Encounter: Payer: Self-pay | Admitting: Physical Therapy

## 2015-11-17 ENCOUNTER — Ambulatory Visit: Payer: 59 | Admitting: Physical Therapy

## 2015-11-17 DIAGNOSIS — G8929 Other chronic pain: Secondary | ICD-10-CM

## 2015-11-17 DIAGNOSIS — M5416 Radiculopathy, lumbar region: Secondary | ICD-10-CM

## 2015-11-17 DIAGNOSIS — M6281 Muscle weakness (generalized): Secondary | ICD-10-CM

## 2015-11-17 DIAGNOSIS — M5442 Lumbago with sciatica, left side: Secondary | ICD-10-CM

## 2015-11-17 DIAGNOSIS — R293 Abnormal posture: Secondary | ICD-10-CM

## 2015-11-17 NOTE — Therapy (Signed)
[place] [facility] [address] [place], [place], [address] Phone: [phone]   Fax:  [phone]  Physical Therapy Treatment  Patient Details  Name: SHELLA LAHMAN MRN: [id number] Date of Birth: 1950-09-12 Referring Provider: Kerman Passey, MD  Encounter Date: 11/17/2015      PT End of Session - 11/17/15 1133    Visit Number 4   Number of Visits 8   Date for PT Re-Evaluation 11/24/15   PT Start Time 0944   PT Stop Time 1039   PT Time Calculation (min) 55 min   Activity Tolerance Patient tolerated treatment well;Patient limited by pain   Behavior During Therapy [facility] for tasks assessed/performed      Past Medical History:  Diagnosis Date  . Diabetes mellitus without complication (HCC)   . Hyperlipidemia   . Hypertension   . Obesity   . Primary osteoarthritis of left knee     Past Surgical History:  Procedure Laterality Date  . ABDOMINAL HYSTERECTOMY  1975   total  . CYSTECTOMY  2003  . ROUX-EN-Y GASTRIC BYPASS  2009  . TONSILLECTOMY  1970    There were no vitals filed for this visit.      Subjective Assessment - 11/17/15 1123    Subjective Pt reports that she has been feeling better the last several days since the fall incident. 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   Pertinent History 10-15 year hx of chronic low back and left leg pain.   Limitations Sitting;Lifting;Standing;Walking;House hold activities   Diagnostic tests x-ray: Anatomic alignment. Osteopenia. No vertebral compression deformity. No acute fracture. Mild narrowing of the L4-5 disc with anterior osteophytes. L5-S1 facet arthropathy   Patient Stated Goals pt would like to have decreased low back/LLE pain   Currently in Pain? Yes   Pain Score 7    Pain Location Back   Pain Orientation Right;Lower   Pain  Descriptors / Indicators Constant   Pain Type Chronic pain   Pain Onset More than a month ago   Pain Frequency Constant     Objective:  Therapeutic Exercise: SciFit 6 minutes Level 7 (warm up/no charge). TrA activation 10 sec holds x5. Pelvic tilt with activation x5. Marching with TrA activation x10. Bridge with TrA activation x10; pt experiences onset of pain at end range - instructed to perform multiple reps in pain tolerable manner. Bridge with ankle dorsiflexion to promote glute activation x2; pt experiences onset of L sided LBP. Functional lift task with #17 lbs x10 from waist height. Box squat x5 with emphasis on hip hinge.  Manual treatment: L hamstring stretch 1 minute distal/proximal insertion. R piriformis stretch 1 minute. LAD LLE 1 minute. STM R lumbar paraspinals and QL for pain relief.   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 She is responsive to verbal cueing for proper set up but demonstrates difficulty performing task without reliance on lumbar extensors with consistency.  Pt response for medical necessity: Pt able to tolerate increased therapeutic exercise this session. Her LBP is most aggravated by lifting tasks where she demonstrates inconsistent lifting mechanics. She has positive response to [facility] following therapeutic exercise with decrease in pain from reported 7/10 to 5/10. Pt will benefit from continued physical therapy to progress core stability program  and practice functional lifting techniques to promote longterm mobility.      PT Education - 11/17/15 1132    Education provided Yes   Education Details HEP: hamstring, piriformis stretch. Core stability program: pg 1   Person(s) Educated Patient   Methods Explanation;Demonstration;Handout   Comprehension Verbalized understanding;Returned demonstration             PT Long Term Goals - 10/27/15 1722      PT LONG TERM GOAL #1    Title Pt will score <20% on Modified Oswestry to promote improved functional mobility   Baseline 10/27/15: 28%   Time 4   Period Weeks   Status New     PT LONG TERM GOAL #2   Title Pt will demonstrate improvement in LLE strength by 1/2 MMT grade to promote full body strength and decrease load on lower back.   Baseline MMT hip flexion L 4+ (painful limited), knee extension 4, knee flexion 4+ (pain limited), dorsiflexion 4 (pain limited)   Time 4   Period Weeks   Status New     PT LONG TERM GOAL #3   Title Pt will be able to tolerate her daily activities with <5/10 LBP to promote pain free function   Baseline pt currently with 7/10 constant LBP   Time 4   Period Days   Status New     PT LONG TERM GOAL #4   Title Pt will be able to perform lumbar R/L lateral flexion/rotation without an increase in left sided LBP so that she can perform ADLs without limitation   Baseline pt with acute exacerbation of LBP, LLE pain with lateral flexion/rotation   Time 4   Period Weeks   Status New            Plan - 11/17/15 1136    Clinical Impression Statement Pt able to tolerate therapeutic exercise this session; she performs TrA activation, pelvic tilt and marching task with no c/o but experiences onset of LBP when performing bridge with dorsiflexion to promote glute activation. Pt sensitive to end range hip extension when performing bridge. She performs most of her client lifts with long lever arm and tendency to rely on lumbar extensors for strength/stability. Pt with difficulty performing lift in clinic with proper mechanics due to body awareness. She experiences onset of LBP with repeated lifting task with inconsistent body mechanics. Experiences spasm in R lumbar paraspinals; benefits from STM to painful tissue at end of session.   Rehab Potential Fair   Clinical Impairments Affecting Rehab Potential Negative: chronic nature of LBP, daily work requirements   PT Frequency 2x / week   PT  Duration 4 weeks   PT Treatment/Interventions ADLs/Self Care Home Management;Biofeedback;Cryotherapy;Electrical Stimulation;Moist Heat;Gait training;Stair training;Functional mobility training;Therapeutic activities;Therapeutic exercise;Balance training;Neuromuscular re-education;Patient/family education;Manual techniques;Passive range of motion;Dry needling   PT Next Visit Plan lumbar mobility, LE strengthening, pain relief   PT Home Exercise Plan see patient instructions   Consulted and Agree with Plan of Care Patient      Patient will benefit from skilled therapeutic intervention in order to improve the following deficits and impairments:  Decreased activity tolerance, Decreased mobility, Decreased strength, Increased muscle spasms, Impaired flexibility, Hypomobility, Improper body mechanics, Pain  Visit Diagnosis: Radiculopathy, lumbar region  Chronic left-sided low back pain with left-sided sciatica  Muscle weakness (generalized)  Abnormal posture     Problem List Patient Active Problem List   Diagnosis Date Noted  . Lumbar radiculopathy, acute 10/14/2015  . Problem of left  ear 07/01/2015  . Medication monitoring encounter 02/18/2015  . Hyperlipidemia   . Hypertension   . Primary osteoarthritis of left knee   . Diabetes mellitus without complication (HCC) 07/24/2014  . 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 Obesity 07/24/2014   Cammie Mcgee, PT, DPT # [id number] Vernona Rieger Nazli Penn SPT 11/17/2015, 11:49 AM  [place] [facility] [address] [place], [place], [address] Phone: [phone]   Fax:  [phone]  Name: FLORAINE BUECHLER MRN: [id number] Date of Birth: 1950/02/18

## 2015-11-18 ENCOUNTER — Telehealth: Payer: Self-pay

## 2015-11-18 DIAGNOSIS — L4050 Arthropathic psoriasis, unspecified: Secondary | ICD-10-CM | POA: Insufficient documentation

## 2015-11-18 NOTE — Addendum Note (Signed)
Addended by: LADA, Janit BernMELINDA P on: 11/18/2015 12:39 PM   Modules accepted: Orders

## 2015-11-18 NOTE — Assessment & Plan Note (Signed)
Refer to rheumatologist; I was not aware patient had psoriatic arthritis; NSAID and referral

## 2015-11-18 NOTE — Telephone Encounter (Signed)
Dr. Sherie DonLada,  Pt called  yesterday requesting  RX for Psoriatic Arthritis, she declined Triage. Reviewed notes and Med list and I saw that you prescribed her Mobic for her pain and aches on the 11/02/2016.    Kipp LaurenceAmber Danaisha Celli, CMA

## 2015-11-18 NOTE — Telephone Encounter (Signed)
Pt.notified

## 2015-11-18 NOTE — Telephone Encounter (Signed)
Oh, if she has psoriatic arthritis, we need to have her see rheumatologist I've entered referral; have her continue the meloxicam until she gets in to see the specialist

## 2015-11-19 ENCOUNTER — Telehealth: Payer: Self-pay

## 2015-11-19 NOTE — Telephone Encounter (Signed)
Called patient to inform her that she has been scheduled to see Dr. Ashley Royaltyhrisele B. Bock with [facility] on 12/13/15 at 1:30pm, but she informed me that they had already contacted her on yesterday.

## 2015-11-22 ENCOUNTER — Encounter: Payer: 59 | Admitting: Physical Therapy

## 2015-11-24 ENCOUNTER — Encounter: Payer: Self-pay | Admitting: Physical Therapy

## 2015-11-24 ENCOUNTER — Telehealth: Payer: Self-pay | Admitting: Family Medicine

## 2015-11-24 ENCOUNTER — Ambulatory Visit: Payer: 59 | Admitting: Physical Therapy

## 2015-11-24 DIAGNOSIS — R293 Abnormal posture: Secondary | ICD-10-CM

## 2015-11-24 DIAGNOSIS — M5442 Lumbago with sciatica, left side: Secondary | ICD-10-CM

## 2015-11-24 DIAGNOSIS — M6281 Muscle weakness (generalized): Secondary | ICD-10-CM

## 2015-11-24 DIAGNOSIS — G8929 Other chronic pain: Secondary | ICD-10-CM

## 2015-11-24 DIAGNOSIS — M5416 Radiculopathy, lumbar region: Secondary | ICD-10-CM

## 2015-11-24 NOTE — Telephone Encounter (Signed)
Pt said that you seen her son ( Sandy Salinas DOB 07-18-1973) on 11-24-15 and you are sending him to a arthritis dr and she just wanted to let you know that and wants to know is this something that could be inherited.

## 2015-11-24 NOTE — Telephone Encounter (Signed)
I'm sending Sandy Salinas a note

## 2015-11-24 NOTE — Therapy (Signed)
[facility] [address] [place], [place], [address] Phone: [phone]   Fax:  [phone]  Physical Therapy Treatment  Patient Details  Name: Sandy Salinas MRN: [id number] Date of Birth: 1951/01/01 Referring Provider: Arnetha Courser, MD  Encounter Date: 11/24/2015      PT End of Session - 11/24/15 1111    Visit Number 5   Number of Visits 8   Date for PT Re-Evaluation 11/24/15   PT Start Time 0945   PT Stop Time 1027   PT Time Calculation (min) 42 min   Activity Tolerance Patient tolerated treatment well   Behavior During Therapy [facility] for tasks assessed/performed      Past Medical History:  Diagnosis Date  . Diabetes mellitus without complication ([place])   . Hyperlipidemia   . Hypertension   . Obesity   . Primary osteoarthritis of left knee     Past Surgical History:  Procedure Laterality Date  . ABDOMINAL HYSTERECTOMY  1975   total  . CYSTECTOMY  2003  . ROUX-EN-Y GASTRIC BYPASS  2009  . TONSILLECTOMY  1970    There were no vitals filed for this visit.      Subjective Assessment - 11/24/15 1108    Subjective Pt states that she is experiencing no low back pain and has been pain free since last weekend. 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 Pt with plans to follow up with MD in the following week to discuss progress.   Pertinent History 10-15 year hx of chronic low back and left leg pain.   Limitations Sitting;Lifting;Standing;Walking;House hold activities   Diagnostic tests x-ray: Anatomic alignment. Osteopenia. No vertebral compression deformity. No acute fracture. Mild narrowing of the L4-5 disc with anterior osteophytes. L5-S1 facet arthropathy   Patient Stated Goals pt would like to have decreased low back/LLE pain   Currently in Pain? No/denies      Objective:  Therapeutic Exercise: Core stability exercises with TrA activation  including: bridge x10, SLR x10 R/L, SLR with circles 15 secs R/L, bridge with marching 15 sec x2, advanced bike 20 sec x2, clamshell LLE 2x10 with tactile cueing for forward hip. Functional mobility exercises including lift form waist height surface #20, lift from knee height surface #20 with emphasis on neutral spine, holding object close to BOS and reliance on hip/knee for flexion. Wall slide with theraball x10.   Pt response for medical necessity: Pt demonstrates positive response to physical therapy treatment with total abatement of back pain sx at this time. She demonstrates full functional mobility and lumbar AROM with no pain this session. Pt feels that she can be independent with management of low back pain sx at this time. She will see her MD in the coming weeks to discuss progress and follow up with PT as needed.      PT Education - 11/24/15 1110    Education provided Yes   Education Details Midwife. Review of core stability program with progression   Person(s) Educated Patient   Methods Explanation;Demonstration;Handout   Comprehension Verbalized understanding;Returned demonstration             PT Long Term Goals - 11/24/15 1310      PT LONG TERM GOAL #1   Title Pt will score <20% on Modified Oswestry to promote improved functional mobility   Baseline 10/27/15: 28% 10/18: 2%   Time 4   Period Weeks  Status Achieved     PT LONG TERM GOAL #2   Title Pt will demonstrate improvement in LLE strength by 1/2 MMT grade to promote full body strength and decrease load on lower back.   Baseline MMT hip flexion L 4+ (painful limited), knee extension 4, knee flexion 4+ (pain limited), dorsiflexion 4 (pain limited). 10/18: no pain; no change in muscle strength   Time 4   Period Weeks   Status Partially Met     PT LONG TERM GOAL #3   Title Pt will be able to tolerate her daily activities with <5/10 LBP to promote pain free function   Baseline pt currently with 7/10  constant LBP. 10/18: no c/o of LBP since last week   Time 4   Period Days   Status Achieved     PT LONG TERM GOAL #4   Title Pt will be able to perform lumbar R/L lateral flexion/rotation without an increase in left sided LBP so that she can perform ADLs without limitation   Baseline pt with acute exacerbation of LBP, LLE pain with lateral flexion/rotation 10/18: pt tolerates lumbar AROM all planes with no exacerbation of sx   Time 4   Period Weeks   Status Achieved           Plan - 11/24/15 1112    Clinical Impression Statement Pt arrives to PT session in 0/10 pain with no recent exacerbation over the last week. She demonstrates good tolerance of core stability program with no c/o of onset of LBP with supine TrA activation exercises and functional lifting tasks. She demonstrates good technique with lifting tasks. 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 Pt discharged from physical therapy at this time, she will follow up with PT if any questions/concerns.   Rehab Potential Fair   Clinical Impairments Affecting Rehab Potential Negative: chronic nature of LBP, daily work requirements   PT Frequency 2x / week   PT Duration 4 weeks   PT Treatment/Interventions ADLs/Self Care Home Management;Biofeedback;Cryotherapy;Electrical Stimulation;Moist Heat;Gait training;Stair training;Functional mobility training;Therapeutic activities;Therapeutic exercise;Balance training;Neuromuscular re-education;Patient/family education;Manual techniques;Passive range of motion;Dry needling   PT Next Visit Plan lumbar mobility, LE strengthening, pain relief   PT Home Exercise Plan see patient instructions   Consulted and Agree with Plan of Care Patient      Patient will benefit from skilled therapeutic intervention in order to improve the following deficits and impairments:  Decreased activity tolerance, Decreased mobility, Decreased strength, Increased muscle spasms,  Impaired flexibility, Hypomobility, Improper body mechanics, Pain  Visit Diagnosis: Radiculopathy, lumbar region  Chronic left-sided low back pain with left-sided sciatica  Muscle weakness (generalized)  Abnormal posture     Problem List Patient Active Problem List   Diagnosis Date Noted  . Psoriatic arthritis ([place]) 11/18/2015  . Lumbar radiculopathy, acute 10/14/2015  . Problem of left ear 07/01/2015  . Medication monitoring encounter 02/18/2015  . Hyperlipidemia   . Hypertension   . Primary osteoarthritis of left knee   . Diabetes mellitus without complication ([place]) 28/00/3491  . 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 Obesity 07/24/2014   Pura Spice, PT, DPT # [id number] Mickel Baas Jenesis Suchy SPT 11/24/2015, 1:16 PM  [facility] [address] [place], [place], [address] Phone: [phone]   Fax:  [phone]  Name: Sandy Salinas MRN: [id number] Date of Birth: 05-22-1950

## 2015-12-13 DIAGNOSIS — M47816 Spondylosis without myelopathy or radiculopathy, lumbar region: Secondary | ICD-10-CM | POA: Diagnosis not present

## 2015-12-13 DIAGNOSIS — L853 Xerosis cutis: Secondary | ICD-10-CM | POA: Diagnosis not present

## 2015-12-16 DIAGNOSIS — L821 Other seborrheic keratosis: Secondary | ICD-10-CM | POA: Diagnosis not present

## 2015-12-16 DIAGNOSIS — L812 Freckles: Secondary | ICD-10-CM | POA: Diagnosis not present

## 2015-12-16 DIAGNOSIS — L578 Other skin changes due to chronic exposure to nonionizing radiation: Secondary | ICD-10-CM | POA: Diagnosis not present

## 2015-12-16 DIAGNOSIS — R21 Rash and other nonspecific skin eruption: Secondary | ICD-10-CM | POA: Diagnosis not present

## 2016-01-07 ENCOUNTER — Other Ambulatory Visit: Payer: Self-pay | Admitting: Family Medicine

## 2016-01-07 NOTE — Telephone Encounter (Signed)
Pt scheduled  

## 2016-01-07 NOTE — Telephone Encounter (Signed)
Patient will be due for her 6 month f/u around January 13th We suggest she make an appt now, as I'll be out some in December and don't want her to scramble to get an appt Thank you

## 2016-01-18 DIAGNOSIS — L820 Inflamed seborrheic keratosis: Secondary | ICD-10-CM | POA: Diagnosis not present

## 2016-01-18 DIAGNOSIS — L309 Dermatitis, unspecified: Secondary | ICD-10-CM | POA: Diagnosis not present

## 2016-01-18 DIAGNOSIS — B359 Dermatophytosis, unspecified: Secondary | ICD-10-CM | POA: Diagnosis not present

## 2016-01-18 DIAGNOSIS — L239 Allergic contact dermatitis, unspecified cause: Secondary | ICD-10-CM | POA: Diagnosis not present

## 2016-01-18 DIAGNOSIS — L821 Other seborrheic keratosis: Secondary | ICD-10-CM | POA: Diagnosis not present

## 2016-01-20 DIAGNOSIS — L259 Unspecified contact dermatitis, unspecified cause: Secondary | ICD-10-CM | POA: Diagnosis not present

## 2016-01-20 DIAGNOSIS — R21 Rash and other nonspecific skin eruption: Secondary | ICD-10-CM | POA: Diagnosis not present

## 2016-01-25 DIAGNOSIS — R21 Rash and other nonspecific skin eruption: Secondary | ICD-10-CM | POA: Diagnosis not present

## 2016-01-25 DIAGNOSIS — L239 Allergic contact dermatitis, unspecified cause: Secondary | ICD-10-CM | POA: Diagnosis not present

## 2016-02-10 ENCOUNTER — Other Ambulatory Visit: Payer: Self-pay | Admitting: Family Medicine

## 2016-02-10 ENCOUNTER — Ambulatory Visit: Payer: 59 | Admitting: Family Medicine

## 2016-02-10 NOTE — Telephone Encounter (Signed)
Pt had an appt today, but we had bad weather, roads are icy; refill approved; last BMP reviewed

## 2016-04-14 ENCOUNTER — Ambulatory Visit (INDEPENDENT_AMBULATORY_CARE_PROVIDER_SITE_OTHER): Payer: Medicare Other

## 2016-04-14 ENCOUNTER — Telehealth: Payer: Self-pay | Admitting: Family Medicine

## 2016-04-14 DIAGNOSIS — S61210A Laceration without foreign body of right index finger without damage to nail, initial encounter: Secondary | ICD-10-CM | POA: Diagnosis not present

## 2016-04-14 DIAGNOSIS — Z23 Encounter for immunization: Secondary | ICD-10-CM

## 2016-04-14 NOTE — Telephone Encounter (Signed)
Pt was at the cemetry and cut her finger when she was rubbing it dirt got into the cut and made it bigger. She was wanting to know when was the last time she had a tetnus shot. She wanted to schedule an appointment for today but everyone was completely booked. Please return call [phone]

## 2016-04-14 NOTE — Telephone Encounter (Signed)
Patient ended up coming in for Tdap booster.

## 2016-05-12 ENCOUNTER — Ambulatory Visit (INDEPENDENT_AMBULATORY_CARE_PROVIDER_SITE_OTHER): Payer: Medicare Other | Admitting: Family Medicine

## 2016-05-12 ENCOUNTER — Encounter: Payer: Self-pay | Admitting: Family Medicine

## 2016-05-12 ENCOUNTER — Other Ambulatory Visit: Payer: Self-pay

## 2016-05-12 VITALS — BP 140/86 | HR 68 | Temp 98.0°F | Resp 14 | Ht 64.0 in | Wt 169.5 lb

## 2016-05-12 DIAGNOSIS — Z13820 Encounter for screening for osteoporosis: Secondary | ICD-10-CM | POA: Diagnosis not present

## 2016-05-12 DIAGNOSIS — Z1239 Encounter for other screening for malignant neoplasm of breast: Secondary | ICD-10-CM

## 2016-05-12 DIAGNOSIS — R109 Unspecified abdominal pain: Secondary | ICD-10-CM | POA: Diagnosis not present

## 2016-05-12 DIAGNOSIS — Z1231 Encounter for screening mammogram for malignant neoplasm of breast: Secondary | ICD-10-CM | POA: Diagnosis not present

## 2016-05-12 DIAGNOSIS — Z780 Asymptomatic menopausal state: Secondary | ICD-10-CM

## 2016-05-12 DIAGNOSIS — H9192 Unspecified hearing loss, left ear: Secondary | ICD-10-CM | POA: Diagnosis not present

## 2016-05-12 DIAGNOSIS — Z8249 Family history of ischemic heart disease and other diseases of the circulatory system: Secondary | ICD-10-CM

## 2016-05-12 DIAGNOSIS — Z0000 Encounter for general adult medical examination without abnormal findings: Secondary | ICD-10-CM | POA: Diagnosis not present

## 2016-05-12 DIAGNOSIS — R9431 Abnormal electrocardiogram [ECG] [EKG]: Secondary | ICD-10-CM | POA: Diagnosis not present

## 2016-05-12 DIAGNOSIS — Z23 Encounter for immunization: Secondary | ICD-10-CM | POA: Insufficient documentation

## 2016-05-12 HISTORY — DX: Family history of ischemic heart disease and other diseases of the circulatory system: Z82.49

## 2016-05-12 LAB — URINALYSIS W MICROSCOPIC + REFLEX CULTURE
Bacteria, UA: NONE SEEN [HPF]
Bilirubin Urine: NEGATIVE
Casts: NONE SEEN [LPF]
Crystals: NONE SEEN [HPF]
GLUCOSE, UA: NEGATIVE
HGB URINE DIPSTICK: NEGATIVE
Ketones, ur: NEGATIVE
LEUKOCYTES UA: NEGATIVE
NITRITE: NEGATIVE
Protein, ur: NEGATIVE
SQUAMOUS EPITHELIAL / LPF: NONE SEEN [HPF] (ref <=5)
Specific Gravity, Urine: 1.017 (ref 1.001–1.035)
WBC UA: NONE SEEN WBC/HPF (ref <=5)
YEAST: NONE SEEN [HPF]
pH: 6 (ref 5.0–8.0)

## 2016-05-12 NOTE — Patient Instructions (Addendum)
Try corticosteroid cream on the spot on your back and let me know if not improved in one month Your goal blood pressure is less than 130 mmHg on top ideally, less than 140 at the very least Try to follow the DASH guidelines (DASH stands for Dietary Approaches to Stop Hypertension) Try to limit the sodium in your diet.  Ideally, consume less than 1.5 grams (less than 1,500mg ) per day. Do not add salt when cooking or at the table.  Check the sodium amount on labels when shopping, and choose items lower in sodium when given a choice. Avoid or limit foods that already contain a lot of sodium. Eat a diet rich in fruits and vegetables and whole grains. Please do call to schedule your bone density study and mammogram; the number to schedule one at either [facility] or [facility] is [phone] We'll have you see the audiologist and cardiologist

## 2016-05-12 NOTE — Assessment & Plan Note (Signed)
Discussed with patient; refer to Dr. Juliann Pares

## 2016-05-12 NOTE — Assessment & Plan Note (Signed)
DEXA 

## 2016-05-12 NOTE — Telephone Encounter (Signed)
Pt stated she needed refills.

## 2016-05-12 NOTE — Assessment & Plan Note (Signed)
We are apparently out of this today; urged patient to return and get this done in the next several weeks

## 2016-05-12 NOTE — Assessment & Plan Note (Signed)
Order Medicare [place]

## 2016-05-12 NOTE — Progress Notes (Signed)
Patient: Sandy Salinas, Female    DOB: 1950/06/29, 66 y.o.   MRN: [id number]  Visit Date: 05/12/2016  Today's Provider: Enid Derry, MD   Chief Complaint  Patient presents with  . Medicare Wellness    Subjective:   Sandy Salinas is a 66 y.o. female who presents today for her Welcome to Medicare Visit. She turned 82 in November 2017  Caregiver input:  n/a  Goes to toe doctor next week, left pinky toe, thickened; has a red bump on her back  USPSTF grade A and B recommendations Depression:  Depression screen [facility] 2/9 05/12/2016 10/14/2015  Decreased Interest 0 0  Down, Depressed, Hopeless 0 0  PHQ - 2 Score 0 0   Hypertension: BP Readings from Last 3 Encounters:  05/12/16 140/86  10/14/15 140/60  08/19/15 128/78   Obesity: eating more salads, weight loss noted Wt Readings from Last 3 Encounters:  05/12/16 169 lb 8 oz (76.9 kg)  10/14/15 178 lb (80.7 kg)  08/19/15 176 lb 1 oz (79.9 kg)   BMI Readings from Last 3 Encounters:  05/12/16 29.09 kg/m  10/14/15 30.55 kg/m  08/19/15 30.22 kg/m    Alcohol: no Tobacco use: no HIV, hep B, hep C: declined STD testing and prevention (chl/gon/syphilis): declined Intimate partner violence: no abuse Breast cancer:  Due, order BRCA gene screening: sister had breast cancer, around age 55; no ovarian cancer in the family Cervical cancer screening: s/p hysterectomy  Osteoporosis: order No results found for: HMDEXASCAN  Fall prevention/vitamin D: fall prevention discussed, be careful, taking 1000 iu vit D Lipids:  Lab Results  Component Value Date   CHOL 134 08/19/2015   CHOL 152 02/18/2015   CHOL 134 08/18/2014   Lab Results  Component Value Date   HDL 40 (L) 08/19/2015   HDL 40 02/18/2015   HDL 35 (L) 08/18/2014   Lab Results  Component Value Date   LDLCALC 68 08/19/2015   LDLCALC 84 02/18/2015   LDLCALC 73 08/18/2014   Lab Results  Component Value Date   TRIG 128 08/19/2015   TRIG 138 02/18/2015   TRIG 129  08/18/2014   Lab Results  Component Value Date   CHOLHDL 3.4 08/19/2015   No results found for: LDLDIRECT  Glucose:  Glucose  Date Value Ref Range Status  02/18/2015 112 (H) 65 - 99 mg/dL Final  08/18/2014 107 (H) 65 - 99 mg/dL Final   Glucose, Bld  Date Value Ref Range Status  08/19/2015 100 (H) 65 - 99 mg/dL Final    Colorectal cancer: order cologuard Lung cancer:  Never smoker AAA: father had AAA Aspirin: not taking aspirin, status post gastric bypass Diet: watching a good diet Exercise: working all the time Skin cancer: no worrisome moles   HPI  Review of Systems  Constitutional: Negative for unexpected weight change (NOT unexpected, all deliberate).  HENT: Positive for hearing loss.   Eyes: Negative for visual disturbance.  Respiratory: Negative for cough.   Cardiovascular: Negative for chest pain.  Gastrointestinal: Negative for blood in stool.  Endocrine:       Drinks all the time  Genitourinary: Negative for hematuria.       No incontinence of urine  Musculoskeletal:       Little bit of back pain on the left, nothing to explain  Skin:       Red bump on the right side of back; saw Dr. Nehemiah Massed; thought allergic to nickel  Allergic/Immunologic: Negative for food allergies.  Neurological: Negative  for tremors.  Psychiatric/Behavioral: Negative for dysphoric mood.    Past Medical History:  Diagnosis Date  . Diabetes mellitus without complication ([place])   . Family history of abdominal aortic aneurysm (AAA) 05/12/2016   father  . Hyperlipidemia   . Hypertension   . Obesity   . Primary osteoarthritis of left knee      Past Surgical History:  Procedure Laterality Date  . ABDOMINAL HYSTERECTOMY  1975   total  . CYSTECTOMY  2003  . ROUX-EN-Y GASTRIC BYPASS  2009  . TONSILLECTOMY  1970    Family History  Problem Relation Age of Onset  . Alzheimer's disease Mother   . Heart murmur Mother   . Dementia Mother   . AAA (abdominal aortic aneurysm)  Father   . Heart disease Father   . Asthma Sister   . Hypertension Sister   . Cancer Sister     breast  . Heart disease Brother   . Hypertension Brother   . Aneurysm Paternal Aunt   . Cancer Paternal Grandmother     possible, not sure what kind    Social History   Social History  . Marital status: Married    Spouse name: N/A  . Number of children: N/A  . Years of education: N/A   Occupational History  . Not on file.   Social History Main Topics  . Smoking status: Never Smoker  . Smokeless tobacco: Never Used  . Alcohol use No  . Drug use: No  . Sexual activity: Not on file   Other Topics Concern  . Not on file   Social History Narrative  . No narrative on file    Outpatient Encounter Prescriptions as of 05/12/2016  Medication Sig Note  . atorvastatin (LIPITOR) 40 MG tablet TAKE 1 TABLET(40 MG) BY MOUTH AT BEDTIME   . Calcium Carbonate-Vitamin D (CALTRATE 600+D PO) Take by mouth daily. 08/18/2014: Received from: [place]: Take by mouth.  . chlorthalidone (HYGROTON) 25 MG tablet TAKE 1 TABLET(25 MG) BY MOUTH DAILY   . lisinopril (PRINIVIL,ZESTRIL) 10 MG tablet TAKE 1 TABLET BY MOUTH EVERY DAY   . Multiple Vitamin (MULTIVITAMIN) tablet Take 1 tablet by mouth daily.   . TURMERIC PO Take 1 capsule by mouth at bedtime. 08/18/2014: Received from: [place]  . vitamin B-12 (CYANOCOBALAMIN) 100 MCG tablet Take by mouth daily.   . vitamin E 100 UNIT capsule Take by mouth daily. Reported on 08/19/2015   . [DISCONTINUED] Cholecalciferol (VITAMIN D3) 5000 units TABS Take by mouth daily. 02/18/2015: Received from: [place]: Take by mouth.  . [DISCONTINUED] folic acid (FOLVITE) 1 MG tablet Take 1 mg by mouth. 07/24/2014: Received from: External Pharmacy Received Sig:   . [DISCONTINUED] meloxicam (MOBIC) 7.5 MG tablet TAKE 1 TABLET BY MOUTH DAILY. IF NEEDED FOR ACHES AND PAINS; DO NOT TAKE ANY NSAIDS  WITH THIS MED. (Patient not taking: Reported on 05/12/2016)    No facility-administered encounter medications on file as of 05/12/2016.     Functional Ability / Safety Screening 1.  Was the timed Get Up and Go test longer than 30 seconds?  no 2.  Does the patient need help with the phone, transportation, shopping,      preparing meals, housework, laundry, medications, or managing money?  no 3.  Does the patient's home have:  loose throw rugs in the hallway?   no      Grab bars in the bathroom?  yes      Handrails on the stairs?   no stairs      Poor lighting?   no 4.  Has the patient noticed any hearing difficulties?   yes; problem with left ear; having ear aches, refer to audiologist  Fall Risk Assessment See under rooming  Depression Screen See under rooming Depression screen [facility] 2/9 05/12/2016 10/14/2015  Decreased Interest 0 0  Down, Depressed, Hopeless 0 0  PHQ - 2 Score 0 0    Advanced Directives Does patient have a HCPOA?    yes, but not sure If yes, name and contact information: Elta [place], at home Does patient have a living will or MOST form?  no  Objective:   Vitals: BP 140/86   Pulse 68   Temp 98 F (36.7 C) (Oral)   Resp 14   Ht _0  (1.626 m)   Wt 169 lb 8 oz (76.9 kg)   SpO2 97%   BMI 29.09 kg/m  Body mass index is 29.09 kg/m. No exam data present  Physical Exam  Constitutional: She appears well-developed and well-nourished.  HENT:  Head: Normocephalic and atraumatic.  Right Ear: Hearing, tympanic membrane, external ear and ear canal normal.  Left Ear: Hearing, tympanic membrane, external ear and ear canal normal.  Eyes: Conjunctivae and EOM are normal. Right eye exhibits no hordeolum. Left eye exhibits no hordeolum. No scleral icterus.  Neck: Carotid bruit is not present. No thyromegaly present.  Cardiovascular: Normal rate, regular rhythm, S1 normal, S2 normal and normal heart sounds.   No extrasystoles are present.  Pulmonary/Chest: Effort normal and breath  sounds normal. No respiratory distress. Right breast exhibits no inverted nipple, no mass, no nipple discharge, no skin change and no tenderness. Left breast exhibits no inverted nipple, no mass, no nipple discharge, no skin change and no tenderness. Breasts are symmetrical.  Abdominal: Soft. Normal appearance and bowel sounds are normal. She exhibits no distension, no abdominal bruit, no pulsatile midline mass and no mass. There is no hepatosplenomegaly. There is no tenderness. No hernia.  Musculoskeletal: Normal range of motion. She exhibits no edema.  Lymphadenopathy:       Head (right side): No submandibular adenopathy present.       Head (left side): No submandibular adenopathy present.    She has no cervical adenopathy.    She has no axillary adenopathy.  Neurological: She is alert. She displays no tremor. No cranial nerve deficit. She exhibits normal muscle tone. Gait normal.  Reflex Scores:      Patellar reflexes are 2+ on the right side and 2+ on the left side. Skin: Skin is warm and dry. No bruising and no ecchymosis noted. No cyanosis. No pallor.  Psychiatric: Her speech is normal and behavior is normal. Thought content normal. Her mood appears not anxious. She does not exhibit a depressed mood.   Mood/affect:  euthymic Appearance:  Casually dressed  Cognitive Testing - 6-CIT  Correct? Score   What year is it? yes 0 Yes = 0    No = 4  What month is it? yes 0 Yes = 0    No = 3  Remember:     [place], [place]     What time is it? yes 0 Yes = 0    No = 3  Count backwards from 20 to 1 yes 0 Correct = 0    1 error = 2   More than 1 error = 4  Say  the months of the year in reverse. yes 0 Correct = 0    1 error = 2   More than 1 error = 4  What address did I ask you to remember? yes 0 Correct = 0  1 error = 2    2 error = 4    3 error = 6    4 error = 8    All wrong = 10       TOTAL SCORE  0/28   Interpretation:  Normal  Normal (0-7) Abnormal (8-28)    Assessment &  Plan:     Annual Wellness Visit  Reviewed patient's Family Medical History Reviewed and updated list of patient's medical providers Assessment of cognitive impairment was done Assessed patient's functional ability Established a written schedule for health screening [place] Completed and Reviewed  Exercise Activities and Dietary recommendations Goals    None      Immunization History  Administered Date(s) Administered  . Influenza,inj,Quad PF,36+ Mos 10/14/2015  . Influenza-Unspecified 10/15/2013, 02/07/2015  . Td 02/07/2004  . Tdap 04/14/2016    Health Maintenance  Topic Date Due  . HEMOGLOBIN A1C  02/19/2016  . MAMMOGRAM  05/12/2016 (Originally 01/02/2001)  . DEXA SCAN  05/12/2016 (Originally 01/03/2016)  . Hepatitis C Screening  05/12/2016 (Originally 1950/08/18)  . HIV Screening  05/12/2016 (Originally 01/02/1966)  . PNA vac Low Risk Adult (1 of 2 - PCV13) 05/12/2016 (Originally 01/03/2016)  . PAP SMEAR  08/17/2024 (Originally 01/03/1972)  . COLONOSCOPY  08/17/2016  . INFLUENZA VACCINE  09/06/2016  . FOOT EXAM  10/13/2016  . OPHTHALMOLOGY EXAM  10/31/2016  . TETANUS/TDAP  04/15/2026    Discussed health benefits of physical activity, and encouraged her to engage in regular exercise appropriate for her age and condition.   No orders of the defined types were placed in this encounter.   Current Outpatient Prescriptions:  .  atorvastatin (LIPITOR) 40 MG tablet, TAKE 1 TABLET(40 MG) BY MOUTH AT BEDTIME, Disp: 90 tablet, Rfl: 3 .  Calcium Carbonate-Vitamin D (CALTRATE 600+D PO), Take by mouth daily., Disp: , Rfl:  .  chlorthalidone (HYGROTON) 25 MG tablet, TAKE 1 TABLET(25 MG) BY MOUTH DAILY, Disp: 30 tablet, Rfl: 3 .  lisinopril (PRINIVIL,ZESTRIL) 10 MG tablet, TAKE 1 TABLET BY MOUTH EVERY DAY, Disp: 90 tablet, Rfl: 3 .  Multiple Vitamin (MULTIVITAMIN) tablet, Take 1 tablet by mouth daily., Disp: , Rfl:  .  TURMERIC PO, Take 1 capsule by mouth  at bedtime., Disp: , Rfl:  .  vitamin B-12 (CYANOCOBALAMIN) 100 MCG tablet, Take by mouth daily., Disp: , Rfl:  .  vitamin E 100 UNIT capsule, Take by mouth daily. Reported on 08/19/2015, Disp: , Rfl:  Medications Discontinued During This Encounter  Medication Reason  . meloxicam (MOBIC) 7.5 MG tablet Error  . folic acid (FOLVITE) 1 MG tablet Error  . Cholecalciferol (VITAMIN D3) 5000 units TABS Error   Problem List Items Addressed This Visit      Other   Screening for osteoporosis    DEXA      Relevant Orders   DG Bone Density   Preventative health care - Primary    USPSTF grade A and B recommendations reviewed with patient; age-appropriate recommendations, preventive care, screening tests, etc discussed and encouraged; healthy living encouraged; see AVS for patient education given to patient      Need for vaccination with 13-polyvalent pneumococcal conjugate vaccine    We are apparently out of this today; urged  patient to return and get this done in the next several weeks      Family history of abdominal aortic aneurysm (AAA)    Order Medicare AAA [place]      Relevant Orders   US Aorta Initial Medicare Screen   Abnormal EKG    Discussed with patient; refer to Dr. Clayborn Bigness      Relevant Orders   Ambulatory referral to Cardiology    Other Visit Diagnoses    Screening for breast cancer       Relevant Orders   MM Digital Screening   Postmenopausal       Relevant Orders   DG Bone Density   Hearing loss of left ear, unspecified hearing loss type       Relevant Orders   Ambulatory referral to Audiology   Left flank pain       Relevant Orders   Urinalysis w microscopic + reflex cultur       Next Medicare Wellness Visit in 12+ months

## 2016-05-12 NOTE — Assessment & Plan Note (Signed)
USPSTF grade A and B recommendations reviewed with patient; age-appropriate recommendations, preventive care, screening tests, etc discussed and encouraged; healthy living encouraged; see AVS for patient education given to patient  

## 2016-05-14 MED ORDER — CHLORTHALIDONE 25 MG PO TABS: 25.0000 mg | ORAL_TABLET | Freq: Every day | ORAL | 0 refills | Status: DC

## 2016-05-14 NOTE — Telephone Encounter (Signed)
One was due for rfeills, she had plenty of the other One refill sent

## 2016-05-15 DIAGNOSIS — E119 Type 2 diabetes mellitus without complications: Secondary | ICD-10-CM | POA: Diagnosis not present

## 2016-05-15 DIAGNOSIS — R9431 Abnormal electrocardiogram [ECG] [EKG]: Secondary | ICD-10-CM | POA: Diagnosis not present

## 2016-05-15 DIAGNOSIS — M1990 Unspecified osteoarthritis, unspecified site: Secondary | ICD-10-CM | POA: Diagnosis not present

## 2016-05-15 DIAGNOSIS — I10 Essential (primary) hypertension: Secondary | ICD-10-CM | POA: Diagnosis not present

## 2016-05-15 DIAGNOSIS — E785 Hyperlipidemia, unspecified: Secondary | ICD-10-CM | POA: Diagnosis not present

## 2016-05-16 ENCOUNTER — Ambulatory Visit (INDEPENDENT_AMBULATORY_CARE_PROVIDER_SITE_OTHER): Payer: Medicare Other | Admitting: Podiatry

## 2016-05-16 ENCOUNTER — Telehealth: Payer: Self-pay

## 2016-05-16 DIAGNOSIS — L608 Other nail disorders: Secondary | ICD-10-CM | POA: Diagnosis not present

## 2016-05-16 DIAGNOSIS — B351 Tinea unguium: Secondary | ICD-10-CM | POA: Diagnosis not present

## 2016-05-16 DIAGNOSIS — M79675 Pain in left toe(s): Secondary | ICD-10-CM

## 2016-05-16 DIAGNOSIS — L603 Nail dystrophy: Secondary | ICD-10-CM | POA: Diagnosis not present

## 2016-05-16 NOTE — Telephone Encounter (Signed)
Patient was informed via voicemail that she has been scheduled for her US aorta this Friday, May 19, 2016 @ 10am at [facility]. She was informed that she is not to have anything to eat or drink after midnight and to arrive 15 mins early. Patient was given the number to scheduling [phone]) if she needs to reschedule.

## 2016-05-16 NOTE — Progress Notes (Signed)
   Subjective: Patient is a 66 year old female presenting today as a new patient complaining of aching of the left fifth toenail that has been ongoing for the past 2-3 months. She states she fractured the left fifth toe several years ago and now is experiencing pain in the toenail. Wearing shoes increases her pain. She reports history of ingrown toenails. She has not done anything to treat her pain. Patient presents today for further treatment and evaluation.  Objective:  General: Well developed, nourished, in no acute distress, alert and oriented x3   Dermatology: Skin is warm, dry and supple bilateral. Left fifth toe appears to be erythematous with evidence of an ingrowing nail. Pain on palpation noted to the border of the nail fold. The remaining nails appear unremarkable at this time. There are no open sores, lesions.  Vascular: Dorsalis Pedis artery and Posterior Tibial artery pedal pulses palpable. No lower extremity edema noted.   Neruologic: Grossly intact via light touch bilateral.  Musculoskeletal: Muscular strength within normal limits in all groups bilateral. Normal range of motion noted to all pedal and ankle joints.   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 Patient evaluated.  2. Discussed treatment alternatives and plan of care. Explained nail avulsion procedure and post procedure course to patient. 3. Patient opted for permanent total nail avulsion.  4. Prior to procedure, local anesthesia infiltration utilized using 3 ml of a 50:50 mixture of 2% plain lidocaine and 0.5% plain marcaine in a normal digital block fashion and a betadine prep performed.  5. Total permanent nail avulsion with chemical matrixectomy performed using 3x30sec applications of phenol followed by alcohol flush.  6. Light dressing applied. 7. Postoperative shoe provided 8. Return to  clinic in 2 weeks.   Felecia Shelling, DPM [facility]  Dr. Felecia Shelling, DPM    [address]                                        [place], [place] [address]                Office [phone]  Fax [phone]

## 2016-05-19 ENCOUNTER — Encounter: Payer: Self-pay | Admitting: Family Medicine

## 2016-05-19 ENCOUNTER — Ambulatory Visit
Admission: RE | Admit: 2016-05-19 | Discharge: 2016-05-19 | Disposition: A | Discharge status: Home / Self Care | Payer: Medicare Other | Source: Ambulatory Visit | Attending: Family Medicine | Admitting: Family Medicine

## 2016-05-19 DIAGNOSIS — I77811 Abdominal aortic ectasia: Secondary | ICD-10-CM | POA: Insufficient documentation

## 2016-05-19 DIAGNOSIS — Z8249 Family history of ischemic heart disease and other diseases of the circulatory system: Secondary | ICD-10-CM | POA: Insufficient documentation

## 2016-05-19 DIAGNOSIS — Z136 Encounter for screening for cardiovascular disorders: Secondary | ICD-10-CM | POA: Insufficient documentation

## 2016-05-19 DIAGNOSIS — Z8489 Family history of other specified conditions: Secondary | ICD-10-CM | POA: Diagnosis not present

## 2016-05-19 HISTORY — DX: Abdominal aortic ectasia: I77.811

## 2016-05-23 ENCOUNTER — Telehealth: Payer: Self-pay | Admitting: Family Medicine

## 2016-05-23 NOTE — Telephone Encounter (Signed)
Patient had MRI last week and very thing was fine. She just had an aunt to pass with aneurysm and wanted to know if she need to be checked for the head aneurysm as well due to the fact that it runs in the family. [phone]

## 2016-05-24 NOTE — Telephone Encounter (Signed)
Called patient she does state it runs in her fathers side, but is not having any symptoms.  She does not want to be referred as of now, but just wanted to make you aware.

## 2016-05-24 NOTE — Telephone Encounter (Signed)
Per Up-to-Date: These findings suggest that widespread screening for cerebral aneurysm is not warranted. This was also the conclusion in guidelines published by the American Stroke Association [7,8]. However, screening may be considered in some populations at relatively high risk of cerebral aneurysm formation: ?First-degree relatives of patients with cerebral aneurysm, when two or more family members have been affected. ?Patients with a heritable disorder associated with the presence of intracranial aneurysm, such as autosomal dominant polycystic kidney disease (ADPKD), glucocorticoid remediable hyperaldosteronism (GRA), and connective tissue diseases such as Ehlers-Danlos syndrome IV and pseudoxanthoma elasticum [9,10]. --------------------------------  In a large population study from [place], the estimated 10-year prospective risk of SAH for relatives free of SAH at the time of the index SAH increased in an ascending manner according to the relationship to the index case as follows [14]: ?One second degree relative, 0.3 percent (95% CI 0.0-0.6) ?One first degree relative, 0.8 percent (95% CI 0.2-1.5) ?Two first degree relatives, 7.1 percent (95% CI 0.2-14.0) -----------------------------------  Asher Muir-- Please call the patient and let her know that her risk is very small if she only has one family member who has had an aneurysm If she has had more than one family member, then I'll be glad to refer her to a vascular doctor to discuss screening and implications

## 2016-05-30 ENCOUNTER — Ambulatory Visit: Payer: Medicare Other | Admitting: Podiatry

## 2016-06-06 ENCOUNTER — Ambulatory Visit (INDEPENDENT_AMBULATORY_CARE_PROVIDER_SITE_OTHER): Payer: Medicare Other | Admitting: Family Medicine

## 2016-06-06 ENCOUNTER — Encounter: Payer: Self-pay | Admitting: Family Medicine

## 2016-06-06 VITALS — BP 134/82 | HR 75 | Temp 98.0°F | Resp 14 | Wt 170.4 lb

## 2016-06-06 DIAGNOSIS — I77811 Abdominal aortic ectasia: Secondary | ICD-10-CM | POA: Diagnosis not present

## 2016-06-06 DIAGNOSIS — Z5181 Encounter for therapeutic drug level monitoring: Secondary | ICD-10-CM | POA: Diagnosis not present

## 2016-06-06 DIAGNOSIS — I10 Essential (primary) hypertension: Secondary | ICD-10-CM | POA: Diagnosis not present

## 2016-06-06 DIAGNOSIS — E119 Type 2 diabetes mellitus without complications: Secondary | ICD-10-CM | POA: Diagnosis not present

## 2016-06-06 DIAGNOSIS — E782 Mixed hyperlipidemia: Secondary | ICD-10-CM | POA: Diagnosis not present

## 2016-06-06 DIAGNOSIS — E559 Vitamin D deficiency, unspecified: Secondary | ICD-10-CM | POA: Diagnosis not present

## 2016-06-06 DIAGNOSIS — Z8249 Family history of ischemic heart disease and other diseases of the circulatory system: Secondary | ICD-10-CM

## 2016-06-06 DIAGNOSIS — Z9884 Bariatric surgery status: Secondary | ICD-10-CM | POA: Diagnosis not present

## 2016-06-06 DIAGNOSIS — K912 Postsurgical malabsorption, not elsewhere classified: Secondary | ICD-10-CM | POA: Diagnosis not present

## 2016-06-06 DIAGNOSIS — L853 Xerosis cutis: Secondary | ICD-10-CM | POA: Diagnosis not present

## 2016-06-06 DIAGNOSIS — L4050 Arthropathic psoriasis, unspecified: Secondary | ICD-10-CM | POA: Diagnosis not present

## 2016-06-06 DIAGNOSIS — E663 Overweight: Secondary | ICD-10-CM

## 2016-06-06 LAB — LIPID PANEL
CHOL/HDL RATIO: 4.5 ratio (ref <5.0)
CHOLESTEROL: 165 mg/dL (ref <200)
HDL: 37 mg/dL — ABNORMAL LOW (ref >50)
LDL Cholesterol: 93 mg/dL (ref <100)
Triglycerides: 173 mg/dL — ABNORMAL HIGH (ref <150)
VLDL: 35 mg/dL — ABNORMAL HIGH (ref <30)

## 2016-06-06 LAB — COMPLETE METABOLIC PANEL WITH GFR
ALT: 19 U/L (ref 6–29)
AST: 30 U/L (ref 10–35)
Albumin: 4.1 g/dL (ref 3.6–5.1)
Alkaline Phosphatase: 76 U/L (ref 33–130)
BILIRUBIN TOTAL: 0.3 mg/dL (ref 0.2–1.2)
BUN: 13 mg/dL (ref 7–25)
CALCIUM: 9.5 mg/dL (ref 8.6–10.4)
CHLORIDE: 101 mmol/L (ref 98–110)
CO2: 26 mmol/L (ref 20–31)
Creat: 0.85 mg/dL (ref 0.50–0.99)
GFR, Est African American: 83 mL/min (ref >=60)
GFR, Est Non African American: 72 mL/min (ref >=60)
Glucose, Bld: 168 mg/dL — ABNORMAL HIGH (ref 65–99)
POTASSIUM: 4.8 mmol/L (ref 3.5–5.3)
Sodium: 140 mmol/L (ref 135–146)
Total Protein: 7.4 g/dL (ref 6.1–8.1)

## 2016-06-06 NOTE — Assessment & Plan Note (Signed)
Check vit D and vitamin B12

## 2016-06-06 NOTE — Assessment & Plan Note (Signed)
Offered referral; politely declined but open invitation in the chart

## 2016-06-06 NOTE — Assessment & Plan Note (Signed)
Well-controlled; try to limit salt 

## 2016-06-06 NOTE — Assessment & Plan Note (Signed)
Check A1c; foot exam by MD today; limit sweets, eye exams yearly

## 2016-06-06 NOTE — Assessment & Plan Note (Signed)
Due for rescan in April 2023; will keep close watch on BP, lipids, DM; referring to vascular for family hx of aneurysms

## 2016-06-06 NOTE — Assessment & Plan Note (Signed)
Encouragement to not gain

## 2016-06-06 NOTE — Patient Instructions (Signed)
We'll refer you to the vascular surgeon We'll get labs today If you have not heard anything from my staff in a week about any orders/referrals/studies from today, please contact us here to follow-up [phone] Please do see your eye doctor regularly, and have your eyes examined every year (or more often per his or her recommendation) Check your feet every night and let me know right away of any sores, infections, numbness, etc. Try to limit sweets, white bread, white rice, white potatoes It is okay with me for you to not check your fingerstick blood sugars (per [employer] Best Practices), unless you are interested and feel it would be helpful for you

## 2016-06-06 NOTE — Progress Notes (Signed)
BP 134/82   Pulse 75   Temp 98 F (36.7 C) (Oral)   Resp 14   Wt 170 lb 6.4 oz (77.3 kg)   SpO2 97%   BMI 29.25 kg/m    Subjective:    Patient ID: Sandy Salinas, female    DOB: 12/21/1950, 66 y.o.   MRN: [id number]  HPI: Sandy Salinas is a 66 y.o. female  Chief Complaint  Patient presents with  . Follow-up   HPI Patient is here for f/u Type 2 DM, no problems with eyes; dry feet, no numbness or calluses or sores Not checking sugars with my blessing Lab Results  Component Value Date   HGBA1C 7.1 (H) 08/19/2015   Dry skin, fingertips crack, feet are dry; status post bariatric surgery; taking vitamin D supplementation  HTN; uses salt shaker; no decongestants BP Readings from Last 3 Encounters:  06/06/16 134/82  05/12/16 140/86  10/14/15 140/60   High cholesterol Lab Results  Component Value Date   CHOL 134 08/19/2015   CHOL 152 02/18/2015   CHOL 134 08/18/2014   Lab Results  Component Value Date   HDL 40 (L) 08/19/2015   HDL 40 02/18/2015   HDL 35 (L) 08/18/2014   Lab Results  Component Value Date   LDLCALC 68 08/19/2015   LDLCALC 84 02/18/2015   LDLCALC 73 08/18/2014   Lab Results  Component Value Date   TRIG 128 08/19/2015   TRIG 138 02/18/2015   TRIG 129 08/18/2014   Lab Results  Component Value Date   CHOLHDL 3.4 08/19/2015   No results found for: LDLDIRECT  Father had AAA; patient has aortic ectasia; 2nd degree relatives with aneurysms  Depression screen [facility] 2/9 06/06/2016 05/12/2016 10/14/2015  Decreased Interest 0 0 0  Down, Depressed, Hopeless 0 0 0  PHQ - 2 Score 0 0 0   Relevant past medical, surgical, family and social history reviewed Past Medical History:  Diagnosis Date  . 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 Diabetes mellitus without complication (HCC)   . Family history of abdominal aortic aneurysm (AAA) 05/12/2016   father  . Hyperlipidemia   . Hypertension     . Obesity   . Primary osteoarthritis of left knee    Past Surgical History:  Procedure Laterality Date  . ABDOMINAL HYSTERECTOMY  1975   total  . CYSTECTOMY  2003  . ROUX-EN-Y GASTRIC BYPASS  2009  . TONSILLECTOMY  1970   Family History  Problem Relation Age of Onset  . Alzheimer's disease Mother   . Heart murmur Mother   . Dementia Mother   . AAA (abdominal aortic aneurysm) Father   . Heart disease Father   . Asthma Sister   . Hypertension Sister   . Cancer Sister     breast  . Heart disease Brother   . Hypertension Brother   . Aneurysm Paternal Aunt   . Cancer Paternal Grandmother     possible, not sure what kind  several in the family; two aunts, one uncle, father Social History  Substance Use Topics  . Smoking status: Never Smoker  . Smokeless tobacco: Never Used  . Alcohol use No   Interim medical history since last visit reviewed. Allergies and medications reviewed  Review of Systems Per HPI unless specifically indicated above     Objective:    BP 134/82   Pulse 75   Temp 98 F (36.7 C) (  Oral)   Resp 14   Wt 170 lb 6.4 oz (77.3 kg)   SpO2 97%   BMI 29.25 kg/m   Wt Readings from Last 3 Encounters:  06/06/16 170 lb 6.4 oz (77.3 kg)  05/12/16 169 lb 8 oz (76.9 kg)  10/14/15 178 lb (80.7 kg)    Physical Exam  Constitutional: She appears well-developed and well-nourished. No distress.  HENT:  Head: Normocephalic and atraumatic.  Eyes: EOM are normal. No scleral icterus.  Neck: No thyromegaly present.  Cardiovascular: Normal rate, regular rhythm and normal heart sounds.   No murmur heard. Pulmonary/Chest: Effort normal and breath sounds normal. No respiratory distress. She has no wheezes.  Abdominal: Soft. Bowel sounds are normal. She exhibits no distension.  Musculoskeletal: Normal range of motion. She exhibits no edema.  Neurological: She is alert. She exhibits normal muscle tone.  Skin: Skin is warm and dry. She is not diaphoretic. No pallor.   Psychiatric: She has a normal mood and affect. Her behavior is normal. Judgment and thought content normal.   Diabetic Foot Form - Detailed   Diabetic Foot Exam - detailed Diabetic Foot exam was performed with the following findings:  Yes 06/06/2016  8:28 AM  Visual Foot Exam completed.:  Yes  Are the toenails ingrown?:  No Normal Range of Motion:  Yes Pulse Foot Exam completed.:  Yes  Right Dorsalis Pedis:  Present Left Dorsalis Pedis:  Present  Sensory Foot Exam Completed.:  Yes Swelling:  No Semmes-Weinstein Monofilament Test R Site 1-Great Toe:  Pos L Site 1-Great Toe:  Pos  R Site 4:  Pos L Site 4:  Pos  R Site 5:  Pos L Site 5:  Pos    Comments:  Dry skin both feet    Results for orders placed or performed in visit on 05/12/16  Urinalysis w microscopic + reflex cultur  Result Value Ref Range   Color, Urine YELLOW YELLOW   APPearance CLEAR CLEAR   Specific Gravity, Urine 1.017 1.001 - 1.035   pH 6.0 5.0 - 8.0   Glucose, UA NEGATIVE NEGATIVE   Bilirubin Urine NEGATIVE NEGATIVE   Ketones, ur NEGATIVE NEGATIVE   Hgb urine dipstick NEGATIVE NEGATIVE   Protein, ur NEGATIVE NEGATIVE   Nitrite NEGATIVE NEGATIVE   Leukocytes, UA NEGATIVE NEGATIVE   WBC, UA NONE SEEN <=5 WBC/HPF   RBC / HPF 0-2 <=2 RBC/HPF   Squamous Epithelial / LPF NONE SEEN <=5 HPF   Bacteria, UA NONE SEEN NONE SEEN HPF   Crystals NONE SEEN NONE SEEN HPF   Casts NONE SEEN NONE SEEN LPF   Yeast NONE SEEN NONE SEEN HPF      Assessment & Plan:   Problem List Items Addressed This Visit      Cardiovascular and Mediastinum   Hypertension    Well-controlled; try to limit salt      Abdominal aortic ectasia (HCC)    Due for rescan in April 2023; will keep close watch on BP, lipids, DM; referring to vascular for family hx of aneurysms        Endocrine   Diabetes mellitus without complication (HCC) - Primary    Check A1c; foot exam by MD today; limit sweets, eye exams yearly      Relevant Orders    COMPLETE METABOLIC PANEL WITH GFR   Hemoglobin A1c   Lipid panel   Microalbumin / creatinine urine ratio     Musculoskeletal and Integument   Psoriatic arthritis (HCC)  Offered referral; politely declined but open invitation in the chart        Other   Status post bariatric surgery    Check vit D and vitamin B12      Relevant Orders   VITAMIN D 25 Hydroxy (Vit-D Deficiency, Fractures)   B12   Overweight (BMI 25.0-29.9)    Encouragement to not gain      Medication monitoring encounter    Check liver and kidneys      Relevant Orders   COMPLETE METABOLIC PANEL WITH GFR   Hyperlipidemia    Limit saturated fats; fasting labs today; discussed goals      Relevant Orders   Lipid panel   Family history of abdominal aortic aneurysm (AAA)    Father had AAA, but other 2nd degree relatives had other aneurysms; refer to vascular for evaluation and any additional testing or monitoring      Relevant Orders   Ambulatory referral to Vascular Surgery    Other Visit Diagnoses    Dry skin       Relevant Orders   VITAMIN D 25 Hydroxy (Vit-D Deficiency, Fractures)       Follow up plan: Return in about 6 months (around 12/07/2016) for twenty minute follow-up with fasting labs.  An after-visit summary was printed and given to the patient at check-out.  Please see the patient instructions which may contain other information and recommendations beyond what is mentioned above in the assessment and plan.  No orders of the defined types were placed in this encounter.   Orders Placed This Encounter  Procedures  . COMPLETE METABOLIC PANEL WITH GFR  . Hemoglobin A1c  . Lipid panel  . Microalbumin / creatinine urine ratio  . VITAMIN D 25 Hydroxy (Vit-D Deficiency, Fractures)  . B12  . Ambulatory referral to Vascular Surgery

## 2016-06-06 NOTE — Assessment & Plan Note (Signed)
Limit saturated fats; fasting labs today; discussed goals

## 2016-06-06 NOTE — Assessment & Plan Note (Signed)
Father had AAA, but other 2nd degree relatives had other aneurysms; refer to vascular for evaluation and any additional testing or monitoring

## 2016-06-06 NOTE — Assessment & Plan Note (Signed)
Check liver and kidneys 

## 2016-06-07 LAB — HEMOGLOBIN A1C
HEMOGLOBIN A1C: 7.9 % — AB (ref <5.7)
MEAN PLASMA GLUCOSE: 180 mg/dL

## 2016-06-07 LAB — MICROALBUMIN / CREATININE URINE RATIO
Creatinine, Urine: 180 mg/dL (ref 20–320)
MICROALB UR: 0.9 mg/dL
MICROALB/CREAT RATIO: 5 ug/mg{creat} (ref <30)

## 2016-06-07 LAB — VITAMIN D 25 HYDROXY (VIT D DEFICIENCY, FRACTURES): VIT D 25 HYDROXY: 30 ng/mL (ref 30–100)

## 2016-06-07 LAB — VITAMIN B12: Vitamin B-12: >2000 pg/mL — ABNORMAL HIGH (ref 200–1100)

## 2016-06-12 ENCOUNTER — Encounter: Payer: Self-pay | Admitting: Family Medicine

## 2016-06-12 ENCOUNTER — Ambulatory Visit (INDEPENDENT_AMBULATORY_CARE_PROVIDER_SITE_OTHER): Payer: Medicare Other | Admitting: Family Medicine

## 2016-06-12 VITALS — BP 138/86 | HR 81 | Temp 97.7°F | Resp 14 | Wt 173.7 lb

## 2016-06-12 DIAGNOSIS — L237 Allergic contact dermatitis due to plants, except food: Secondary | ICD-10-CM

## 2016-06-12 DIAGNOSIS — E119 Type 2 diabetes mellitus without complications: Secondary | ICD-10-CM

## 2016-06-12 MED ORDER — PREDNISONE 10 MG PO TABS: ORAL_TABLET | ORAL | 0 refills | Status: AC

## 2016-06-12 NOTE — Progress Notes (Signed)
BP 138/86   Pulse 81   Temp 97.7 F (36.5 C) (Oral)   Resp 14   Wt 173 lb 11.2 oz (78.8 kg)   SpO2 97%   BMI 29.82 kg/m    Subjective:    Patient ID: Sandy Salinas, female    DOB: 10-29-1950, 66 y.o.   MRN: [id number]  HPI: Sandy Salinas is a 66 y.o. female  Chief Complaint  Patient presents with  . Rash    posion oak all over   HPI She has gotten back into some poison ivy; she knows where it is; she was weed eating and got it on face and arm and leg; no tongue swelling, no trouble breathing; was not burning it She has used calamine lotion; rubbed paste on the hands and then you leave it on and then rinse off; no help Tried caladryl; no antihistamines; very itchy; face real bad; went to the beach on Saturday for the day; no help She used to see Dr. Lajoyce Corners who would give her a shot She would prefer to take prednisone to "knock this out"  Depression screen [facility] 2/9 06/12/2016 06/06/2016 05/12/2016 10/14/2015  Decreased Interest 0 0 0 0  Down, Depressed, Hopeless 0 0 0 0  PHQ - 2 Score 0 0 0 0    Relevant past medical, surgical, family and social history reviewed Past Medical History:  Diagnosis Date  . 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 Diabetes mellitus without complication (HCC)   . Family history of abdominal aortic aneurysm (AAA) 05/12/2016   father  . Hyperlipidemia   . Hypertension   . Obesity   . Primary osteoarthritis of left knee    Past Surgical History:  Procedure Laterality Date  . ABDOMINAL HYSTERECTOMY  1975   total  . CYSTECTOMY  2003  . ROUX-EN-Y GASTRIC BYPASS  2009  . TONSILLECTOMY  1970   family history includes AAA (abdominal aortic aneurysm) in her father; Alzheimer's disease in her mother; Aneurysm in her paternal aunt; Asthma in her sister; Cancer in her paternal grandmother and sister; Dementia in her mother; Heart disease in her brother and father; Heart murmur in her mother;  Hypertension in her brother and sister.   Social History   Social History  . Marital status: Married    Spouse name: N/A  . Number of children: N/A  . Years of education: N/A   Occupational History  . Not on file.   Social History Main Topics  . Smoking status: Never Smoker  . Smokeless tobacco: Never Used  . Alcohol use No  . Drug use: No  . Sexual activity: Not on file   Other Topics Concern  . Not on file   Social History Narrative  . No narrative on file   Interim medical history since last visit reviewed. Allergies and medications reviewed  Review of Systems Per HPI unless specifically indicated above     Objective:    BP 138/86   Pulse 81   Temp 97.7 F (36.5 C) (Oral)   Resp 14   Wt 173 lb 11.2 oz (78.8 kg)   SpO2 97%   BMI 29.82 kg/m   Wt Readings from Last 3 Encounters:  06/12/16 173 lb 11.2 oz (78.8 kg)  06/06/16 170 lb 6.4 oz (77.3 kg)  05/12/16 169 lb 8 oz (76.9 kg)    Physical Exam  Constitutional: She appears well-developed and well-nourished.  HENT:  Mouth/Throat: Oropharynx is clear and moist and mucous membranes are normal.  No swelling  Pulmonary/Chest: Effort normal and breath sounds normal. She has no wheezes.  Skin:  Numerous erythematous papular lesions on the face, arms, legs consistent with rhus dermatitis  Psychiatric: Her mood appears not anxious.   Diabetic Foot Form - Detailed   Diabetic Foot Exam - detailed Diabetic Foot exam was performed with the following findings:  Yes 06/12/2016 10:55 AM  Visual Foot Exam completed.:  Yes  Are the toenails ingrown?:  No Normal Range of Motion:  Yes Pulse Foot Exam completed.:  Yes  Right Dorsalis Pedis:  Present Left Dorsalis Pedis:  Present  Sensory Foot Exam Completed.:  Yes Swelling:  No Semmes-Weinstein Monofilament Test R Site 1-Great Toe:  Pos L Site 1-Great Toe:  Pos  R Site 4:  Pos L Site 4:  Pos  R Site 5:  Pos L Site 5:  Pos          Assessment & Plan:   Problem  List Items Addressed This Visit      Endocrine   Diabetes mellitus without complication (HCC)    Patient opted for oral steroids over topical; watch sugars; walk if numbers go up, call if needed       Other Visit Diagnoses    Allergic contact dermatitis due to plants, except food    -  Primary   discussed spread, treatment; will use prednisone at her request, avoidance key       Follow up plan: No Follow-up on file.  An after-visit summary was printed and given to the patient at check-out.  Please see the patient instructions which may contain other information and recommendations beyond what is mentioned above in the assessment and plan.  Meds ordered this encounter  Medications  . predniSONE (DELTASONE) 10 MG tablet    Sig: Take 5 pills by mouth today, then 4 on Tues, 3 on Wed, 2 on Thurs, 1 on Fri; take with food    Dispense:  15 tablet    Refill:  0    No orders of the defined types were placed in this encounter.

## 2016-06-12 NOTE — Assessment & Plan Note (Signed)
Patient opted for oral steroids over topical; watch sugars; walk if numbers go up, call if needed

## 2016-06-12 NOTE — Patient Instructions (Addendum)
Okay to use OTC antihistamine without the decongestants (plain claritin or benadryl, etc.) Start the steroid Be aware it will run your sugars up; really really watch diet carefully Check sugars twice a day and go for a brisk walk if sugars climbing  Poison Ivy Dermatitis Poison ivy dermatitis is redness and soreness (inflammation) of the skin. It is caused by a chemical that is found on the leaves of the poison ivy plant. You may also have itching, a rash, and blisters. Symptoms often clear up in 1-2 weeks. You may get this condition by touching a poison ivy plant. You can also get it by touching something that has the chemical on it. This may include animals or objects that have come in contact with the plant. Follow these instructions at home: General instructions   Take or apply over-the-counter and prescription medicines only as told by your doctor.  If you touch poison ivy, wash your skin with soap and cold water right away.  Use hydrocortisone creams or calamine lotion as needed to help with itching.  Take oatmeal baths as needed. Use colloidal oatmeal. You can get this at a pharmacy or grocery store. Follow the instructions on the package.  Do not scratch or rub your skin.  While you have the rash, wash your clothes right after you wear them. Prevention   Know what poison ivy looks like so you can avoid it. This plant has three leaves with flowering branches on a single stem. The leaves are glossy. They have uneven edges that come to a point at the front.  If you have touched poison ivy, wash with soap and water right away. Be sure to wash under your fingernails.  When hiking or camping, wear long pants, a long-sleeved shirt, tall socks, and hiking boots. You can also use a lotion on your skin that helps to prevent contact with the chemical on the plant.  If you think that your clothes or outdoor gear came in contact with poison ivy, rinse them off with a garden hose before you  bring them inside your house. Contact a doctor if:  You have open sores in the rash area.  You have more redness, swelling, or pain in the affected area.  You have redness that spreads beyond the rash area.  You have fluid, blood, or pus coming from the affected area.  You have a fever.  You have a rash over a large area of your body.  You have a rash on your eyes, mouth, or genitals.  Your rash does not get better after a few days. Get help right away if:  Your face swells or your eyes swell shut.  You have trouble breathing.  You have trouble swallowing. This information is not intended to replace advice given to you by your health care provider. Make sure you discuss any questions you have with your health care provider. Document Released: 02/25/2010 Document Revised: 07/01/2015 Document Reviewed: 07/01/2014 Elsevier Interactive Patient Education  2017 [employer]

## 2016-06-16 ENCOUNTER — Encounter (INDEPENDENT_AMBULATORY_CARE_PROVIDER_SITE_OTHER): Payer: Self-pay | Admitting: Vascular Surgery

## 2016-06-16 ENCOUNTER — Ambulatory Visit (INDEPENDENT_AMBULATORY_CARE_PROVIDER_SITE_OTHER): Payer: Medicare Other | Admitting: Vascular Surgery

## 2016-06-16 VITALS — BP 131/77 | HR 67 | Resp 16 | Ht 64.0 in | Wt 172.0 lb

## 2016-06-16 DIAGNOSIS — I10 Essential (primary) hypertension: Secondary | ICD-10-CM | POA: Diagnosis not present

## 2016-06-16 DIAGNOSIS — E782 Mixed hyperlipidemia: Secondary | ICD-10-CM | POA: Diagnosis not present

## 2016-06-16 DIAGNOSIS — E119 Type 2 diabetes mellitus without complications: Secondary | ICD-10-CM

## 2016-06-16 DIAGNOSIS — I77811 Abdominal aortic ectasia: Secondary | ICD-10-CM | POA: Diagnosis not present

## 2016-06-16 DIAGNOSIS — Z8249 Family history of ischemic heart disease and other diseases of the circulatory system: Secondary | ICD-10-CM

## 2016-06-16 NOTE — Progress Notes (Signed)
Patient ID: Sandy Salinas, female   DOB: 1950-02-24, 66 y.o.   MRN: [id number]  Chief Complaint  Patient presents with  . New Evaluation    For possible Aneurysms    HPI Sandy Salinas is a 66 y.o. female.  I am asked to see the patient by Dr. Sherie Don for evaluation of an ectatic aorta and a strong family history of AAA.  The patient reports Her father and a paternal aunt both having had abdominal aortic aneurysms that she knows of. She thinks it may have been more family members with an aneurysm although she is not entirely sure. Both of those individuals smoked for much of their life. She does not smoke and never has. She reports that her blood pressure is generally pretty normal. With her strong family history she had a welcome to Medicare screening ultrasound done recently which showed ectasia of her abdominal aorta measuring 2.6 cm in maximal diameter. She denies any aneurysm related symptoms. Specifically, the patient denies new back or abdominal pain, or signs of peripheral embolization. She does have multiple atherosclerotic risk factors as listed below   Past Medical History:  Diagnosis Date  . 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 Diabetes mellitus without complication (HCC)   . Family history of abdominal aortic aneurysm (AAA) 05/12/2016   father  . Hyperlipidemia   . Hypertension   . Obesity   . Primary osteoarthritis of left knee     Past Surgical History:  Procedure Laterality Date  . ABDOMINAL HYSTERECTOMY  1975   total  . CYSTECTOMY  2003  . ROUX-EN-Y GASTRIC BYPASS  2009  . TONSILLECTOMY  1970    Family History  Problem Relation Age of Onset  . Alzheimer's disease Mother   . Heart murmur Mother   . Dementia Mother   . AAA (abdominal aortic aneurysm) Father   . Heart disease Father   . Asthma Sister   . Hypertension Sister   . Cancer Sister        breast  . Heart disease Brother   .  Hypertension Brother   . Aneurysm Paternal Aunt   . Cancer Paternal Grandmother        possible, not sure what kind     Social History Social History  Substance Use Topics  . Smoking status: Never Smoker  . Smokeless tobacco: Never Used  . Alcohol use No  No IVDU  Allergies  Allergen Reactions  . Tramadol Nausea And Vomiting    Current Outpatient Prescriptions  Medication Sig Dispense Refill  . atorvastatin (LIPITOR) 40 MG tablet TAKE 1 TABLET(40 MG) BY MOUTH AT BEDTIME 90 tablet 3  . Calcium Carbonate-Vitamin D (CALTRATE 600+D PO) Take by mouth daily.    . chlorthalidone (HYGROTON) 25 MG tablet Take 1 tablet (25 mg total) by mouth daily. 90 tablet 0  . lisinopril (PRINIVIL,ZESTRIL) 10 MG tablet TAKE 1 TABLET BY MOUTH EVERY DAY 90 tablet 3  . Multiple Vitamin (MULTIVITAMIN) tablet Take 1 tablet by mouth daily.    . predniSONE (DELTASONE) 10 MG tablet Take 5 pills by mouth today, then 4 on Tues, 3 on Wed, 2 on Thurs, 1 on Fri; take with food 15 tablet 0  . TURMERIC PO Take 1 capsule by mouth at bedtime.    . vitamin B-12 (CYANOCOBALAMIN) 100 MCG tablet Take by mouth daily.    . vitamin E 100 UNIT capsule Take by  mouth daily. Reported on 08/19/2015     No current facility-administered medications for this visit.       REVIEW OF SYSTEMS (Negative unless checked)  Constitutional: [] Weight loss  [] Fever  [] Chills Cardiac: [] Chest pain   [] Chest pressure   [] Palpitations   [] Shortness of breath when laying flat   [] Shortness of breath at rest   [] Shortness of breath with exertion. Vascular:  [] Pain in legs with walking   [] Pain in legs at rest   [] Pain in legs when laying flat   [] Claudication   [] Pain in feet when walking  [] Pain in feet at rest  [] Pain in feet when laying flat   [] History of DVT   [] Phlebitis   [] Swelling in legs   [] Varicose veins   [] Non-healing ulcers Pulmonary:   [] Uses home oxygen   [] Productive cough   [] Hemoptysis   [] Wheeze  [] COPD   [] Asthma Neurologic:   [] Dizziness  [] Blackouts   [] Seizures   [] History of stroke   [] History of TIA  [] Aphasia   [] Temporary blindness   [] Dysphagia   [] Weakness or numbness in arms   [] Weakness or numbness in legs Musculoskeletal:  [x] Arthritis   [] Joint swelling   [x] Joint pain   [] Low back pain Hematologic:  [] Easy bruising  [] Easy bleeding   [] Hypercoagulable state   [] Anemic  [] Hepatitis Gastrointestinal:  [] Blood in stool   [] Vomiting blood  [] Gastroesophageal reflux/heartburn   [] Abdominal pain Genitourinary:  [] Chronic kidney disease   [] Difficult urination  [] Frequent urination  [] Burning with urination   [] Hematuria Skin:  [] Rashes   [] Ulcers   [] Wounds Psychological:  [] History of anxiety   []  History of major depression.    Physical Exam BP 131/77 (BP Location: Right Arm)   Pulse 67   Resp 16   Ht 5\' 4"  (1.626 m)   Wt 172 lb (78 kg)   BMI 29.52 kg/m  Gen:  WD/WN, NAD Head: [place]/AT, No temporalis wasting.  Ear/Nose/Throat: Hearing grossly intact, nares w/o erythema or drainage, oropharynx w/o Erythema/Exudate Eyes: Conjunctiva clear, sclera non-icteric  Neck: trachea midline.  No bruit or JVD.  Pulmonary:  Good air movement, clear to auscultation bilaterally.  Cardiac: RRR, normal S1, S2, no Murmurs, rubs or gallops. Vascular:  Vessel Right Left  Radial Palpable Palpable  Ulnar Palpable Palpable  Brachial Palpable Palpable  Carotid Palpable, without bruit Palpable, without bruit  Aorta Not palpable N/A  Femoral Palpable Palpable  Popliteal Palpable Palpable  PT Palpable Palpable  DP Palpable Palpable   Gastrointestinal: soft, non-tender/non-distended. Aortic impulse not enlarged Musculoskeletal: M/S 5/5 throughout.  Extremities without ischemic changes.  No deformity or atrophy. Scattered varicosities bilaterally Neurologic: Sensation grossly intact in extremities.  Symmetrical.  Speech is fluent. Motor exam as listed above. Psychiatric: Judgment intact, Mood & affect appropriate for  pt's clinical situation. Dermatologic: No rashes or ulcers noted.  No cellulitis or open wounds. Lymph : No Cervical, Axillary, or Inguinal lymphadenopathy.   Radiology [place] Aorta Initial Medicare Screen  Result Date: 05/19/2016 CLINICAL DATA:  Patient with family history of AAA. First time exam. EXAM: US ABDOMINAL AORTA MEDICARE SCREENING TECHNIQUE: Ultrasound examination of the abdominal aorta was performed as a screening evaluation for abdominal aortic aneurysm. COMPARISON:  None. FINDINGS: Maximum Diameter: 2.6 cm, proximal segment ABDOMINAL AORTA Proximal:  2.6 x 2.4 cm Mid:  2.5 x 2.2 cm Distal:  1.6 x 1.8 cm IMPRESSION: Ectatic abdominal aorta at risk for aneurysm development. Recommend followup by ultrasound in 5 years. This recommendation follows ACR consensus  guidelines: White Paper of the ACR Incidental Findings Committee II on Vascular Findings. J Am Coll Radiol 2013; 10:789-794. Electronically Signed   By: Corlis Leak M.D.   On: 05/19/2016 12:26    Labs Recent Results (from the past 2160 hour(s))  Urinalysis w microscopic + reflex cultur     Status: None   Collection Time: 05/12/16  8:53 AM  Result Value Ref Range   Color, Urine YELLOW YELLOW   APPearance CLEAR CLEAR   Specific Gravity, Urine 1.017 1.001 - 1.035   pH 6.0 5.0 - 8.0   Glucose, UA NEGATIVE NEGATIVE   Bilirubin Urine NEGATIVE NEGATIVE   Ketones, ur NEGATIVE NEGATIVE   Hgb urine dipstick NEGATIVE NEGATIVE   Protein, ur NEGATIVE NEGATIVE   Nitrite NEGATIVE NEGATIVE   Leukocytes, UA NEGATIVE NEGATIVE   WBC, UA NONE SEEN <=5 WBC/HPF   RBC / HPF 0-2 <=2 RBC/HPF   Squamous Epithelial / LPF NONE SEEN <=5 HPF   Bacteria, UA NONE SEEN NONE SEEN HPF   Crystals NONE SEEN NONE SEEN HPF   Casts NONE SEEN NONE SEEN LPF   Yeast NONE SEEN NONE SEEN HPF  COMPLETE METABOLIC PANEL WITH GFR     Status: Abnormal   Collection Time: 06/06/16  8:31 AM  Result Value Ref Range   Sodium 140 135 - 146 mmol/L   Potassium 4.8 3.5 - 5.3  mmol/L   Chloride 101 98 - 110 mmol/L   CO2 26 20 - 31 mmol/L   Glucose, Bld 168 (H) 65 - 99 mg/dL   BUN 13 7 - 25 mg/dL   Creat 1.61 0.96 - 0.45 mg/dL    Comment:   For patients > or = 66 years of age: The upper reference limit for Creatinine is approximately 13% higher for people identified as African-American.      Total Bilirubin 0.3 0.2 - 1.2 mg/dL   Alkaline Phosphatase 76 33 - 130 U/L   AST 30 10 - 35 U/L   ALT 19 6 - 29 U/L   Total Protein 7.4 6.1 - 8.1 g/dL   Albumin 4.1 3.6 - 5.1 g/dL   Calcium 9.5 8.6 - 40.9 mg/dL   GFR, Est African American 83 >=60 mL/min   GFR, Est Non African American 72 >=60 mL/min  Hemoglobin A1c     Status: Abnormal   Collection Time: 06/06/16  8:31 AM  Result Value Ref Range   Hgb A1c MFr Bld 7.9 (H) <5.7 %    Comment:   For someone without known diabetes, a hemoglobin A1c value of 6.5% or greater indicates that they may have diabetes and this should be confirmed with a follow-up test.   For someone with known diabetes, a value <7% indicates that their diabetes is well controlled and a value greater than or equal to 7% indicates suboptimal control. A1c targets should be individualized based on duration of diabetes, age, comorbid conditions, and other considerations.   Currently, no consensus exists for use of hemoglobin A1c for diagnosis of diabetes for children.      Mean Plasma Glucose 180 mg/dL  Lipid panel     Status: Abnormal   Collection Time: 06/06/16  8:31 AM  Result Value Ref Range   Cholesterol 165 <200 mg/dL   Triglycerides 811 (H) <150 mg/dL   HDL 37 (L) >91 mg/dL   Total CHOL/HDL Ratio 4.5 <5.0 Ratio   VLDL 35 (H) <30 mg/dL   LDL Cholesterol 93 <478 mg/dL  Microalbumin / creatinine urine  ratio     Status: None   Collection Time: 06/06/16  8:31 AM  Result Value Ref Range   Creatinine, Urine 180 20 - 320 mg/dL   Microalb, Ur 0.9 Not estab mg/dL   Microalb Creat Ratio 5 <30 mcg/mg creat    Comment: The ADA has  defined abnormalities in albumin excretion as follows:           Category           Result                            (mcg/mg creatinine)                 Normal:    <30       Microalbuminuria:    30 - 299   Clinical albuminuria:    > or = 300   The ADA recommends that at least two of three specimens collected within a 3 - 6 month period be abnormal before considering a patient to be within a diagnostic category.     VITAMIN D 25 Hydroxy (Vit-D Deficiency, Fractures)     Status: None   Collection Time: 06/06/16  8:31 AM  Result Value Ref Range   Vit D, 25-Hydroxy 30 30 - 100 ng/mL    Comment: Vitamin D Status           25-OH Vitamin D        Deficiency                <20 ng/mL        Insufficiency         20 - 29 ng/mL        Optimal             > or = 30 ng/mL   For 25-OH Vitamin D testing on patients on D2-supplementation and patients for whom quantitation of D2 and D3 fractions is required, the Quest[employer]ureD 25-OH VIT D, (D2,D3), LC/MS/MS is recommended: order code 16109 (patients > 2 yrs).   B12     Status: Abnormal   Collection Time: 06/06/16  8:31 AM  Result Value Ref Range   Vitamin B-12 >2000 (H) 200 - 1100 pg/mL    Assessment/Plan:  Hyperlipidemia lipid control important in reducing the progression of atherosclerotic disease. Continue statin therapy   Family history of abdominal aortic aneurysm (AAA) She has ectasia and with strong genetic predisposition would check her a little more frequently than every 5 years. We'll plan to recheck in 2-3 years.  Hypertension blood pressure control important in reducing the progression of atherosclerotic disease and aneurysmal degeneration. On appropriate oral medications.   Abdominal aortic ectasia (HCC) Recent aortic duplex demonstrated an approximately 2.6 cm abdominal aorta in a patient with a very strong family history of aneurysms. Although this is not frankly aneurysmal, this would be ectatic particularly for a woman.  With a strong genetic predisposition think this should be checked every 2-3 years at this point. I discussed the natural history and pathophysiology of aneurysms. The patient will contact our office with any problems in the interim.      Festus Barren 06/16/2016, 4:00 PM   This note was created with Dragon medical transcription system.  Any errors from dictation are unintentional.

## 2016-06-16 NOTE — Assessment & Plan Note (Signed)
blood pressure control important in reducing the progression of atherosclerotic disease and aneurysmal degeneration. On appropriate oral medications.  

## 2016-06-16 NOTE — Assessment & Plan Note (Signed)
She has ectasia and with strong genetic predisposition would check her a little more frequently than every 5 years. We'll plan to recheck in 2-3 years.

## 2016-06-16 NOTE — Assessment & Plan Note (Signed)
Recent aortic duplex demonstrated an approximately 2.6 cm abdominal aorta in a patient with a very strong family history of aneurysms. Although this is not frankly aneurysmal, this would be ectatic particularly for a woman. With a strong genetic predisposition think this should be checked every 2-3 years at this point. I discussed the natural history and pathophysiology of aneurysms. The patient will contact our office with any problems in the interim.

## 2016-06-16 NOTE — Patient Instructions (Signed)
Abdominal Aortic Aneurysm Blood pumps away from the heart through tubes (blood vessels) called arteries. Aneurysms are weak or damaged places in the wall of an artery. It bulges out like a balloon. An abdominal aortic aneurysm happens in the main artery of the body (aorta). It can burst or tear, causing bleeding inside the body. This is an emergency. It needs treatment right away. What are the causes? The exact cause is unknown. Things that could cause this problem include:  Fat and other substances building up in the lining of a tube.  Swelling of the walls of a blood vessel.  Certain tissue diseases.  Belly (abdominal) trauma.  An infection in the main artery of the body.  What increases the risk? There are things that make it more likely for you to have an aneurysm. These include:  Being over the age of 66 years old.  Having high blood pressure (hypertension).  Being a female.  Being white.  Being very overweight (obese).  Having a family history of aneurysm.  Using tobacco products.  What are the signs or symptoms? Symptoms depend on the size of the aneurysm and how fast it grows. There may not be symptoms. If symptoms occur, they can include:  Pain (belly, side, lower back, or groin).  Feeling full after eating a small amount of food.  Feeling sick to your stomach (nauseous), throwing up (vomiting), or both.  Feeling a lump in your belly that feels like it is beating (pulsating).  Feeling like you will pass out (faint).  How is this treated?  Medicine to control blood pressure and pain.  Imaging tests to see if the aneurysm gets bigger.  Surgery. How is this prevented? To lessen your chance of getting this condition:  Stop smoking. Stop chewing tobacco.  Limit or avoid alcohol.  Keep your blood pressure, blood sugar, and cholesterol within normal limits.  Eat less salt.  Eat foods low in saturated fats and cholesterol. These are found in animal and  whole dairy products.  Eat more fiber. Fiber is found in whole grains, vegetables, and fruits.  Keep a healthy weight.  Stay active and exercise often.  This information is not intended to replace advice given to you by your health care provider. Make sure you discuss any questions you have with your health care provider. Document Released: 05/20/2012 Document Revised: 07/01/2015 Document Reviewed: 02/23/2012 Elsevier Interactive Patient Education  2017 Elsevier Inc.  

## 2016-06-16 NOTE — Assessment & Plan Note (Signed)
lipid control important in reducing the progression of atherosclerotic disease. Continue statin therapy  

## 2016-06-19 ENCOUNTER — Ambulatory Visit
Admission: RE | Admit: 2016-06-19 | Discharge: 2016-06-19 | Disposition: A | Discharge status: Home / Self Care | Payer: Medicare Other | Source: Ambulatory Visit | Attending: Family Medicine | Admitting: Family Medicine

## 2016-06-19 ENCOUNTER — Other Ambulatory Visit: Payer: Self-pay | Admitting: Family Medicine

## 2016-06-19 DIAGNOSIS — M818 Other osteoporosis without current pathological fracture: Secondary | ICD-10-CM | POA: Diagnosis not present

## 2016-06-19 DIAGNOSIS — Z780 Asymptomatic menopausal state: Secondary | ICD-10-CM | POA: Diagnosis not present

## 2016-06-19 DIAGNOSIS — Z13820 Encounter for screening for osteoporosis: Secondary | ICD-10-CM | POA: Diagnosis not present

## 2016-06-19 DIAGNOSIS — R928 Other abnormal and inconclusive findings on diagnostic imaging of breast: Secondary | ICD-10-CM

## 2016-06-19 DIAGNOSIS — R921 Mammographic calcification found on diagnostic imaging of breast: Secondary | ICD-10-CM

## 2016-06-19 DIAGNOSIS — M810 Age-related osteoporosis without current pathological fracture: Secondary | ICD-10-CM | POA: Insufficient documentation

## 2016-06-19 DIAGNOSIS — Z1231 Encounter for screening mammogram for malignant neoplasm of breast: Secondary | ICD-10-CM | POA: Diagnosis not present

## 2016-06-21 ENCOUNTER — Other Ambulatory Visit: Payer: Self-pay | Admitting: Family Medicine

## 2016-06-21 DIAGNOSIS — M810 Age-related osteoporosis without current pathological fracture: Secondary | ICD-10-CM | POA: Insufficient documentation

## 2016-06-21 DIAGNOSIS — M818 Other osteoporosis without current pathological fracture: Secondary | ICD-10-CM

## 2016-06-21 NOTE — Progress Notes (Signed)
Refer to endo 

## 2016-06-21 NOTE — Assessment & Plan Note (Signed)
Discussed dx, refer to endo to consider treatment options

## 2016-06-26 ENCOUNTER — Emergency Department: Payer: Medicare Other

## 2016-06-26 ENCOUNTER — Emergency Department
Admission: EM | Admit: 2016-06-26 | Discharge: 2016-06-26 | Disposition: A | Discharge status: Home / Self Care | Payer: Medicare Other | Attending: Emergency Medicine | Admitting: Emergency Medicine

## 2016-06-26 DIAGNOSIS — Y929 Unspecified place or not applicable: Secondary | ICD-10-CM | POA: Diagnosis not present

## 2016-06-26 DIAGNOSIS — I10 Essential (primary) hypertension: Secondary | ICD-10-CM | POA: Diagnosis not present

## 2016-06-26 DIAGNOSIS — Z79899 Other long term (current) drug therapy: Secondary | ICD-10-CM | POA: Diagnosis not present

## 2016-06-26 DIAGNOSIS — Y999 Unspecified external cause status: Secondary | ICD-10-CM | POA: Insufficient documentation

## 2016-06-26 DIAGNOSIS — W19XXXA Unspecified fall, initial encounter: Secondary | ICD-10-CM | POA: Diagnosis not present

## 2016-06-26 DIAGNOSIS — Y939 Activity, unspecified: Secondary | ICD-10-CM | POA: Insufficient documentation

## 2016-06-26 DIAGNOSIS — E119 Type 2 diabetes mellitus without complications: Secondary | ICD-10-CM | POA: Diagnosis not present

## 2016-06-26 DIAGNOSIS — S99922A Unspecified injury of left foot, initial encounter: Secondary | ICD-10-CM | POA: Diagnosis not present

## 2016-06-26 DIAGNOSIS — S93602A Unspecified sprain of left foot, initial encounter: Secondary | ICD-10-CM | POA: Diagnosis not present

## 2016-06-26 DIAGNOSIS — M7989 Other specified soft tissue disorders: Secondary | ICD-10-CM | POA: Diagnosis not present

## 2016-06-26 NOTE — Discharge Instructions (Signed)
Your exam and x-ray are negative for fracture or dislocation. Follow-up with Dr. Sherie DonLada or your podiatrist for continued symptoms.

## 2016-06-26 NOTE — ED Provider Notes (Signed)
[facility] Emergency Department Provider Note ____________________________________________  Time seen: 2128  I have reviewed the triage vital signs and the nursing notes.  HISTORY  Chief Complaint  Foot Pain  HPI Sandy Salinas is a 66 y.o. female Presents to the ED for evaluation of left foot pain and swelling after fall today. Patient denies any head injury, loss of consciousness, or other injury. She reports pain with weightbearing to the left foot.  Past Medical History:  Diagnosis Date  . 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 Diabetes mellitus without complication (HCC)   . Family history of abdominal aortic aneurysm (AAA) 05/12/2016   father  . Hyperlipidemia   . Hypertension   . Obesity   . Primary osteoarthritis of left knee     Patient Active Problem List   Diagnosis Date Noted  . Osteoporosis 06/21/2016  . 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 Family history of abdominal aortic aneurysm (AAA) 05/12/2016  . Screening for osteoporosis 05/12/2016  . Abnormal EKG 05/12/2016  . Preventative health care 05/12/2016  . Need for vaccination with 13-polyvalent pneumococcal conjugate vaccine 05/12/2016  . Psoriatic arthritis (HCC) 11/18/2015  . Lumbar radiculopathy, acute 10/14/2015  . Problem of left ear 07/01/2015  . Medication monitoring encounter 02/18/2015  . Vitamin D deficiency 08/18/2014  . Hyperlipidemia   . Hypertension   . Primary osteoarthritis of left knee   . Diabetes mellitus without complication (HCC) 07/24/2014  . 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 Overweight (BMI 25.0-29.9) 07/24/2014    Past Surgical History:  Procedure Laterality Date  . ABDOMINAL HYSTERECTOMY  1975   total  . BREAST BIOPSY    . CYSTECTOMY  2003  . ROUX-EN-Y GASTRIC BYPASS  2009  . TONSILLECTOMY  1970    Prior to Admission  medications   Medication Sig Start Date End Date Taking? Authorizing Provider  atorvastatin (LIPITOR) 40 MG tablet TAKE 1 TABLET(40 MG) BY MOUTH AT BEDTIME 08/22/15   Lada, Janit BernMelinda P, MD  Calcium Carbonate-Vitamin D (CALTRATE 600+D PO) Take by mouth daily.    [provider]  chlorthalidone (HYGROTON) 25 MG tablet Take 1 tablet (25 mg total) by mouth daily. 05/14/16   Kerman PasseyLada, Melinda P, MD  lisinopril (PRINIVIL,ZESTRIL) 10 MG tablet TAKE 1 TABLET BY MOUTH EVERY DAY 08/22/15   Kerman PasseyLada, Melinda P, MD  Multiple Vitamin (MULTIVITAMIN) tablet Take 1 tablet by mouth daily.    [provider]  TURMERIC PO Take 1 capsule by mouth at bedtime.    [provider]  vitamin B-12 (CYANOCOBALAMIN) 100 MCG tablet Take by mouth daily.    [provider]  vitamin E 100 UNIT capsule Take by mouth daily. Reported on 08/19/2015    [provider]    Allergies Tramadol  Family History  Problem Relation Age of Onset  . Alzheimer's disease Mother   . Heart murmur Mother   . Dementia Mother   . AAA (abdominal aortic aneurysm) Father   . Heart disease Father   . Asthma Sister   . Hypertension Sister   . Cancer Sister        breast  . Breast cancer Sister        late 4640's  . Heart disease Brother   . Hypertension Brother   . Aneurysm Paternal Aunt   . Cancer Paternal Grandmother        possible, not  sure what kind    Social History Social History  Substance Use Topics  . Smoking status: Never Smoker  . Smokeless tobacco: Never Used  . Alcohol use No    Review of Systems  Constitutional: Negative for fever. Cardiovascular: Negative for chest pain. Respiratory: Negative for shortness of breath. Musculoskeletal: Negative for back pain. Left foot pain as above. Skin: Negative for rash. Neurological: Negative for headaches, focal weakness or numbness. ____________________________________________  PHYSICAL EXAM:  VITAL SIGNS: ED Triage Vitals  Enc Vitals  Group     BP 06/26/16 2050 (!) 153/79     Pulse Rate 06/26/16 2050 65     Resp 06/26/16 2050 16     Temp 06/26/16 2050 98.4 F (36.9 C)     Temp Source 06/26/16 2050 Oral     SpO2 06/26/16 2050 98 %     Weight 06/26/16 2044 170 lb (77.1 kg)     Height 06/26/16 2044 5\' 4"  (1.626 m)     Head Circumference --      Peak Flow --      Pain Score 06/26/16 2043 10     Pain Loc --      Pain Edu? --      Excl. in GC? --     Constitutional: Alert and oriented. Well appearing and in no distress. Head: Normocephalic and atraumatic. Cardiovascular: Normal rate, regular rhythm. Normal distal pulses. Respiratory: Normal respiratory effort. No wheezes/rales/rhonchi. Musculoskeletal: Left foot without obvious deformity, dislocation, or edema. There is some lateral erythema noted at the base of the fifth MTP. Patient reports some disability with fifth toe range of motion. Otherwise ankle is stable and negative drawer. No calf or achilles tenderness is noted .Nontender with normal range of motion in all extremities.  Neurologic:  Antalgic gait without ataxia. Normal speech and language. No gross focal neurologic deficits are appreciated. Skin:  Skin is warm, dry and intact. No rash noted. ____________________________________________   RADIOLOGY  Left Foot  FINDINGS: There is no evidence of fracture or dislocation. There is no evidence of arthropathy or other focal bone abnormality. Soft tissues are unremarkable. Small plantar spur of the calcaneus.  IMPRESSION: No acute bony abnormalities.  I, Saavi Mceachron, Charlesetta Ivory, personally viewed and evaluated these images (plain radiographs) as part of my medical decision making, as well as reviewing the written report by the radiologist. ____________________________________________  PROCEDURES  Ace bandage ____________________________________________  INITIAL IMPRESSION / ASSESSMENT AND PLAN / ED COURSE  Patient with the ED evaluation of a left  foot sprain status post mechanical fall. No radiologic evidence of fracture or dislocation. She is placed in Ace bandage for support and will follow up with her podiatrist for ongoing symptom management. ____________________________________________  FINAL CLINICAL IMPRESSION(S) / ED DIAGNOSES  Final diagnoses:  Sprain of left foot, initial encounter      Lissa Hoard, PA-C 06/26/16 2136    Minna Antis, MD 06/26/16 2235

## 2016-06-26 NOTE — ED Triage Notes (Signed)
Patient c/o left foot pain/swelling post fall today. Pt denies LOC and any other injury. Pt ambulatory to room , with slight limp

## 2016-06-30 NOTE — Telephone Encounter (Signed)
ERRENOUS °

## 2016-07-10 ENCOUNTER — Ambulatory Visit
Admission: RE | Admit: 2016-07-10 | Discharge: 2016-07-10 | Disposition: A | Discharge status: Home / Self Care | Payer: Medicare Other | Source: Ambulatory Visit | Attending: Family Medicine | Admitting: Family Medicine

## 2016-07-10 ENCOUNTER — Other Ambulatory Visit: Payer: Self-pay | Admitting: Family Medicine

## 2016-07-10 DIAGNOSIS — R928 Other abnormal and inconclusive findings on diagnostic imaging of breast: Secondary | ICD-10-CM

## 2016-07-10 DIAGNOSIS — R921 Mammographic calcification found on diagnostic imaging of breast: Secondary | ICD-10-CM

## 2016-07-20 ENCOUNTER — Other Ambulatory Visit: Payer: Self-pay | Admitting: Family Medicine

## 2016-07-20 NOTE — Telephone Encounter (Signed)
Last cr and K+ and sgpt reviewed; rxs approved

## 2016-07-25 ENCOUNTER — Other Ambulatory Visit: Payer: Self-pay | Admitting: Family Medicine

## 2016-07-25 NOTE — Telephone Encounter (Signed)
Reviewed May Cr, -lytes; Rx approved

## 2016-09-04 DIAGNOSIS — M810 Age-related osteoporosis without current pathological fracture: Secondary | ICD-10-CM | POA: Diagnosis not present

## 2016-09-11 ENCOUNTER — Encounter: Payer: Self-pay | Admitting: Family Medicine

## 2016-09-11 ENCOUNTER — Other Ambulatory Visit: Payer: Self-pay | Admitting: Family Medicine

## 2016-09-11 ENCOUNTER — Ambulatory Visit (INDEPENDENT_AMBULATORY_CARE_PROVIDER_SITE_OTHER): Payer: Medicare Other | Admitting: Family Medicine

## 2016-09-11 VITALS — BP 118/80 | HR 72 | Temp 98.0°F | Resp 16 | Wt 174.0 lb

## 2016-09-11 DIAGNOSIS — M25562 Pain in left knee: Secondary | ICD-10-CM | POA: Diagnosis not present

## 2016-09-11 DIAGNOSIS — M5432 Sciatica, left side: Secondary | ICD-10-CM | POA: Diagnosis not present

## 2016-09-11 DIAGNOSIS — Z9884 Bariatric surgery status: Secondary | ICD-10-CM

## 2016-09-11 DIAGNOSIS — E119 Type 2 diabetes mellitus without complications: Secondary | ICD-10-CM

## 2016-09-11 DIAGNOSIS — G5602 Carpal tunnel syndrome, left upper limb: Secondary | ICD-10-CM | POA: Diagnosis not present

## 2016-09-11 MED ORDER — TRAMADOL HCL 50 MG PO TABS: 50.0000 mg | ORAL_TABLET | Freq: Four times a day (QID) | ORAL | 0 refills | Status: DC | PRN

## 2016-09-11 MED ORDER — OMEPRAZOLE 20 MG PO CPDR: 20.0000 mg | DELAYED_RELEASE_CAPSULE | Freq: Two times a day (BID) | ORAL | 0 refills | Status: DC

## 2016-09-11 MED ORDER — PREDNISONE 10 MG (21) PO TBPK: ORAL_TABLET | ORAL | 0 refills | Status: DC

## 2016-09-11 NOTE — Progress Notes (Signed)
BP 118/80   Pulse 72   Temp 98 F (36.7 C) (Oral)   Resp 16   Wt 174 lb (78.9 kg)   SpO2 94%   BMI 29.87 kg/m    Subjective:    Patient ID: Sandy Salinas, female    DOB: 07/17/1950, 66 y.o.   MRN: [id number]  HPI: Sandy Salinas is a 66 y.o. female  Chief Complaint  Patient presents with  . Pain    Hip and Back pain, more her back then her hip  . Tingling    left fingers  . Leg Pain    Left leg pain    HPI Patient is here for an acute visit Going to [place] soon and having knee and hip pain; all along the left side Hip was bothering her along the left side, but okay now Main problem is the left knee No known knee injury; nothing recent and nothing old Left knee has hurt for 20 years Has to stretch the left knee out if sitting for a long time No back pain now; was hurting through the left buttock and all the left side No B/B Left leg feels tight and gives out No hx of ruptured discs Tried some tramadol and it didn't make her sick; has not been able to sleep from the pain; eases it a little bit  Diabetes has been okay; no significant dry mouth or blurred vision; fingers have been tingling in the left hand Has terrible dry skin on her hands; uses cream and gloves; saw dermatologist; nickel allergy  High blood pressure; monitors at home with wrist cuff; 120 to 125 over 77  She also describes numbness and tingling in her left hand, thumb through radial aspect of the ring finger  Depression screen [facility] 2/9 09/11/2016 06/12/2016 06/06/2016 05/12/2016 10/14/2015  Decreased Interest 0 0 0 0 0  Down, Depressed, Hopeless 0 0 0 0 0  PHQ - 2 Score 0 0 0 0 0   Relevant past medical, surgical, family and social history reviewed Past Medical History:  Diagnosis Date  . 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 Diabetes mellitus without complication (HCC)   . Family history of abdominal aortic aneurysm (AAA) 05/12/2016   father  . Hyperlipidemia   . Hypertension   . Obesity   . Osteoporosis   . Primary osteoarthritis of left knee    Past Surgical History:  Procedure Laterality Date  . ABDOMINAL HYSTERECTOMY  1975   total  . BREAST BIOPSY Right 04/09/2012   FIBROADENOMA WITH COARSE CALCIFICATIONS.done by byrnett  . CYSTECTOMY  2003  . ROUX-EN-Y GASTRIC BYPASS  2009  . TONSILLECTOMY  1970   Family History  Problem Relation Age of Onset  . Alzheimer's disease Mother   . Heart murmur Mother   . Dementia Mother   . AAA (abdominal aortic aneurysm) Father   . Heart disease Father   . Asthma Sister   . Hypertension Sister   . Cancer Sister        breast  . Breast cancer Sister        late 35's  . Heart disease Brother   . Hypertension Brother   . Aneurysm Paternal Aunt   . Cancer Paternal Grandmother        possible, not sure what kind   Social History   Social History  . Marital status: Married    Spouse name: N/A  .  Number of children: N/A  . Years of education: N/A   Occupational History  . Not on file.   Social History Main Topics  . Smoking status: Never Smoker  . Smokeless tobacco: Never Used  . Alcohol use No  . Drug use: No  . Sexual activity: No   Other Topics Concern  . Not on file   Social History Narrative  . No narrative on file    Interim medical history since last visit reviewed. Allergies and medications reviewed  Review of Systems Per HPI unless specifically indicated above     Objective:    BP 118/80   Pulse 72   Temp 98 F (36.7 C) (Oral)   Resp 16   Wt 174 lb (78.9 kg)   SpO2 94%   BMI 29.87 kg/m   Wt Readings from Last 3 Encounters:  09/11/16 174 lb (78.9 kg)  06/26/16 170 lb (77.1 kg)  06/16/16 172 lb (78 kg)    Physical Exam  Constitutional: She appears well-developed and well-nourished.  HENT:  Mouth/Throat: Mucous membranes are normal.  Eyes: EOM are normal. No scleral icterus.  Cardiovascular: Normal rate and regular rhythm.     Pulses:      Radial pulses are 2+ on the right side, and 2+ on the left side.       Dorsalis pedis pulses are 2+ on the right side, and 2+ on the left side.  Pulmonary/Chest: Effort normal and breath sounds normal.  Musculoskeletal:       Left hip: She exhibits tenderness.       Lumbar back: She exhibits normal range of motion, no tenderness, no bony tenderness, no edema and no deformity.       Legs: Neurological:  Reflex Scores:      Patellar reflexes are 2+ on the right side and 2+ on the left side. LE strength 5/5; unable to elicit positive Phalen's and Tinel's signs  Psychiatric: She has a normal mood and affect. Her behavior is normal.   Diabetic Foot Form - Detailed   Diabetic Foot Exam - detailed Diabetic Foot exam was performed with the following findings:  Yes 09/11/2016 10:05 AM  Visual Foot Exam completed.:  Yes  Pulse Foot Exam completed.:  Yes  Right Dorsalis Pedis:  Present Left Dorsalis Pedis:  Present  Sensory Foot Exam Completed.:  Yes Semmes-Weinstein Monofilament Test R Site 1-Great Toe:  Pos L Site 1-Great Toe:  Pos          Assessment & Plan:   Problem List Items Addressed This Visit      Endocrine   Diabetes mellitus without complication (HCC)    Low dose prednisone; she can monitor sugars      Relevant Medications   metFORMIN (GLUCOPHAGE) 1000 MG tablet     Other   Status post bariatric surgery    Discussed options; tough because she is going on an amazing trip, spent a lot of money, going with family, really wants to walk around; will use low dose steroid, plus PPI; also using tramadol; discussed risk of ulcers; I consulted UTD and found many articles regarding prednisone use in folks s/p bariatric surgery; I think if she is educated what to watch for, reasons to stop, starts PPI day before, takes prednisone with meal, should hopefully tolerate it well and be able to enjoy her trip; she agrees       Other Visit Diagnoses    Sciatica of left side     -  Primary   involvement too large an area for a cortisone shot; discussed treatment with steroid; low dose given hx of roux-en-y; start PPI today, then prednisone tomorrow   Acute pain of left knee       with sciatica going down leg; low dose steroid plus tramadol; risk of ulcer discussed; talked with another colleague about use of steroid; PPI, reasons to stop    Carpal tunnel syndrome of left wrist       will be using low dose steroid for six days, which may give some relief       Follow up plan: No Follow-up on file.  An after-visit summary was printed and given to the patient at check-out.  Please see the patient instructions which may contain other information and recommendations beyond what is mentioned above in the assessment and plan.  Meds ordered this encounter  Medications  . Cholecalciferol (VITAMIN D) 2000 units tablet    Sig: Take 1 tablet by mouth daily.  . metFORMIN (GLUCOPHAGE) 1000 MG tablet    Sig: Take 2 tablets by mouth 2 (two) times daily.  . traMADol (ULTRAM) 50 MG tablet    Sig: Take 1 tablet (50 mg total) by mouth every 6 (six) hours as needed.    Dispense:  20 tablet    Refill:  0  . predniSONE (STERAPRED UNI-PAK 21 TAB) 10 MG (21) TBPK tablet    Sig: Take 3 pills by mouth on day 1, then 3 on day 2, 2 on day 3, 2 on day 4, 1 on day 5, and 1 on day 6; take with food    Dispense:  12 tablet    Refill:  0  . omeprazole (PRILOSEC) 20 MG capsule    Sig: Take 1 capsule (20 mg total) by mouth 2 (two) times daily before a meal.    Dispense:  30 capsule    Refill:  0    No orders of the defined types were placed in this encounter.  NCCSRS web site reviewed, no red flags

## 2016-09-11 NOTE — Assessment & Plan Note (Signed)
Discussed options; tough because she is going on an amazing trip, spent a lot of money, going with family, really wants to walk around; will use low dose steroid, plus PPI; also using tramadol; discussed risk of ulcers; I consulted UTD and found many articles regarding prednisone use in folks s/p bariatric surgery; I think if she is educated what to watch for, reasons to stop, starts PPI day before, takes prednisone with meal, should hopefully tolerate it well and be able to enjoy her trip; she agrees

## 2016-09-11 NOTE — Patient Instructions (Signed)
Start the omeprazole today Start the prednisone tomorrow Use the tramadol if needed Stop the prednisone immediately if any stomach ache, dark stools, indigestion, reflux Always take the prednisone on a full stomach

## 2016-09-11 NOTE — Assessment & Plan Note (Signed)
Low dose prednisone; she can monitor sugars

## 2016-11-08 DIAGNOSIS — M810 Age-related osteoporosis without current pathological fracture: Secondary | ICD-10-CM | POA: Insufficient documentation

## 2016-11-08 DIAGNOSIS — M8580 Other specified disorders of bone density and structure, unspecified site: Secondary | ICD-10-CM | POA: Insufficient documentation

## 2016-11-14 ENCOUNTER — Encounter: Payer: Self-pay | Admitting: Family Medicine

## 2016-11-14 ENCOUNTER — Telehealth: Payer: Self-pay | Admitting: Family Medicine

## 2016-11-14 ENCOUNTER — Ambulatory Visit (INDEPENDENT_AMBULATORY_CARE_PROVIDER_SITE_OTHER): Payer: Medicare Other | Admitting: Family Medicine

## 2016-11-14 VITALS — BP 132/82 | HR 67 | Temp 98.2°F | Resp 14 | Wt 173.2 lb

## 2016-11-14 DIAGNOSIS — M818 Other osteoporosis without current pathological fracture: Secondary | ICD-10-CM

## 2016-11-14 DIAGNOSIS — T753XXA Motion sickness, initial encounter: Secondary | ICD-10-CM

## 2016-11-14 DIAGNOSIS — E119 Type 2 diabetes mellitus without complications: Secondary | ICD-10-CM

## 2016-11-14 DIAGNOSIS — E559 Vitamin D deficiency, unspecified: Secondary | ICD-10-CM | POA: Diagnosis not present

## 2016-11-14 DIAGNOSIS — Z23 Encounter for immunization: Secondary | ICD-10-CM | POA: Diagnosis not present

## 2016-11-14 DIAGNOSIS — Z5181 Encounter for therapeutic drug level monitoring: Secondary | ICD-10-CM | POA: Diagnosis not present

## 2016-11-14 DIAGNOSIS — I10 Essential (primary) hypertension: Secondary | ICD-10-CM | POA: Diagnosis not present

## 2016-11-14 DIAGNOSIS — H9392 Unspecified disorder of left ear: Secondary | ICD-10-CM | POA: Diagnosis not present

## 2016-11-14 DIAGNOSIS — R42 Dizziness and giddiness: Secondary | ICD-10-CM

## 2016-11-14 DIAGNOSIS — E782 Mixed hyperlipidemia: Secondary | ICD-10-CM | POA: Diagnosis not present

## 2016-11-14 MED ORDER — SCOPOLAMINE 1 MG/3DAYS TD PT72: 1.0000 | MEDICATED_PATCH | TRANSDERMAL | 0 refills | Status: DC

## 2016-11-14 NOTE — Assessment & Plan Note (Signed)
Mildly elev TG, low HDL; HDL may be genetic

## 2016-11-14 NOTE — Assessment & Plan Note (Signed)
Likely reaction to reclast; will check Cr, calcium, and vit D

## 2016-11-14 NOTE — Telephone Encounter (Signed)
Pt already had a 76m follow up appt scheduled for november and you asked her to schedule a 3 month follow up. Do you want the patient to keep the November appt or cancel it?

## 2016-11-14 NOTE — Progress Notes (Signed)
BP 132/82   Pulse 67   Temp 98.2 F (36.8 C) (Oral)   Resp 14   Wt 173 lb 3.2 oz (78.6 kg)   SpO2 97%   BMI 29.73 kg/m    Subjective:    Patient ID: Sandy Salinas, female    DOB: 02-Sep-1950, 66 y.o.   MRN: [id number]  HPI: Sandy Salinas is a 66 y.o. female  Chief Complaint  Patient presents with  . Back Pain    LL back also has seen ortho  . Medication Refill    needs motion sickness med  . Dizziness    off balance, ear pain, when she turns head a certain way  . Allergic Reaction    possibly to bone med injection    HPI Patient is here for an acute visit She had an ear ache before the infusion; still bothering her, balance is off; every time she washes her hair, gets an ear ache; either ear  Likely reaction to the Reclast; she is not going to do that again; she contacted the rheumatologist; she was sore for 2-3 days; pain along the sides of the ribs; felt like her ribs were broken; she did contact rheum  She is not taking metformin; just taking lisinopril; due for a1c Type 2 diabetes Lab Results  Component Value Date   HGBA1C 7.9 (H) 06/06/2016  normal urine microalb:Cr in May  High cholesterol Low HDL and high TG Lab Results  Component Value Date   CHOL 165 06/06/2016   HDL 37 (L) 06/06/2016   LDLCALC 93 06/06/2016   TRIG 173 (H) 06/06/2016   CHOLHDL 4.5 06/06/2016  she walks enough she thinks; hardly ever sits down; works up to good pace  She needs motion sickness medicine, going to the [place] and needs medicine, 8 day trip  No patience, short-fuse; not new; going on for years; can't sit still; always has to be doing something  Depression screen [facility] 2/9 11/14/2016 09/11/2016 06/12/2016 06/06/2016 05/12/2016  Decreased Interest 0 0 0 0 0  Down, Depressed, Hopeless 0 0 0 0 0  PHQ - 2 Score 0 0 0 0 0   She has been seeing the orthopaedist for her knee and leg and back; left side is really bad; went to [place], couldn't hardly move;   Relevant  past medical, surgical, family and social history reviewed Past Medical History:  Diagnosis Date  . 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 Diabetes mellitus without complication (HCC)   . Family history of abdominal aortic aneurysm (AAA) 05/12/2016   father  . Hyperlipidemia   . Hypertension   . Obesity   . Osteoporosis   . Primary osteoarthritis of left knee    Past Surgical History:  Procedure Laterality Date  . ABDOMINAL HYSTERECTOMY  1975   total  . BREAST BIOPSY Right 04/09/2012   FIBROADENOMA WITH COARSE CALCIFICATIONS.done by byrnett  . CYSTECTOMY  2003  . ROUX-EN-Y GASTRIC BYPASS  2009  . TONSILLECTOMY  1970   Family History  Problem Relation Age of Onset  . Alzheimer's disease Mother   . Heart murmur Mother   . Dementia Mother   . AAA (abdominal aortic aneurysm) Father   . Heart disease Father   . Asthma Sister   . Hypertension Sister   . Cancer Sister        breast  . Breast cancer Sister  late 61's  . Heart disease Brother   . Hypertension Brother   . Aneurysm Paternal Aunt   . Cancer Paternal Grandmother        possible, not sure what kind   Social History   Social History  . Marital status: Married    Spouse name: N/A  . Number of children: N/A  . Years of education: N/A   Occupational History  . Not on file.   Social History Main Topics  . Smoking status: Never Smoker  . Smokeless tobacco: Never Used  . Alcohol use No  . Drug use: No  . Sexual activity: No   Other Topics Concern  . Not on file   Social History Narrative  . No narrative on file    Interim medical history since last visit reviewed. Allergies and medications reviewed  Review of Systems Per HPI unless specifically indicated above     Objective:    BP 132/82   Pulse 67   Temp 98.2 F (36.8 C) (Oral)   Resp 14   Wt 173 lb 3.2 oz (78.6 kg)   SpO2 97%   BMI 29.73 kg/m   Wt Readings from Last 3  Encounters:  11/14/16 173 lb 3.2 oz (78.6 kg)  09/11/16 174 lb (78.9 kg)  06/26/16 170 lb (77.1 kg)    Physical Exam  Constitutional: She appears well-developed and well-nourished.  HENT:  Right Ear: Tympanic membrane and ear canal normal.  Left Ear: Tympanic membrane normal.  Mouth/Throat: Oropharynx is clear and moist and mucous membranes are normal.  No swelling  Neck: Carotid bruit is not present.  Pulmonary/Chest: Effort normal and breath sounds normal. She has no wheezes.  Musculoskeletal:       Left knee: She exhibits normal range of motion, no swelling and no deformity.  Psychiatric: Her mood appears not anxious. Her speech is not rapid and/or pressured. She is not agitated, not aggressive, not hyperactive and not combative.  Good eye contact with examiner   Diabetic Foot Form - Detailed   Diabetic Foot Exam - detailed Diabetic Foot exam was performed with the following findings:  Yes 11/14/2016 12:57 PM  Visual Foot Exam completed.:  Yes  Pulse Foot Exam completed.:  Yes  Right Dorsalis Pedis:  Present Left Dorsalis Pedis:  Present  Sensory Foot Exam Completed.:  Yes Semmes-Weinstein Monofilament Test R Site 1-Great Toe:  Pos L Site 1-Great Toe:  Pos        Results for orders placed or performed in visit on 06/06/16  COMPLETE METABOLIC PANEL WITH GFR  Result Value Ref Range   Sodium 140 135 - 146 mmol/L   Potassium 4.8 3.5 - 5.3 mmol/L   Chloride 101 98 - 110 mmol/L   CO2 26 20 - 31 mmol/L   Glucose, Bld 168 (H) 65 - 99 mg/dL   BUN 13 7 - 25 mg/dL   Creat 1.61 0.96 - 0.45 mg/dL   Total Bilirubin 0.3 0.2 - 1.2 mg/dL   Alkaline Phosphatase 76 33 - 130 U/L   AST 30 10 - 35 U/L   ALT 19 6 - 29 U/L   Total Protein 7.4 6.1 - 8.1 g/dL   Albumin 4.1 3.6 - 5.1 g/dL   Calcium 9.5 8.6 - 40.9 mg/dL   GFR, Est African American 83 >=60 mL/min   GFR, Est Non African American 72 >=60 mL/min  Hemoglobin A1c  Result Value Ref Range   Hgb A1c MFr Bld 7.9 (H) <5.7 %  Mean  Plasma Glucose 180 mg/dL  Lipid panel  Result Value Ref Range   Cholesterol 165 <200 mg/dL   Triglycerides 161 (H) <150 mg/dL   HDL 37 (L) >09 mg/dL   Total CHOL/HDL Ratio 4.5 <5.0 Ratio   VLDL 35 (H) <30 mg/dL   LDL Cholesterol 93 <604 mg/dL  Microalbumin / creatinine urine ratio  Result Value Ref Range   Creatinine, Urine 180 20 - 320 mg/dL   Microalb, Ur 0.9 Not estab mg/dL   Microalb Creat Ratio 5 <30 mcg/mg creat  VITAMIN D 25 Hydroxy (Vit-D Deficiency, Fractures)  Result Value Ref Range   Vit D, 25-Hydroxy 30 30 - 100 ng/mL  B12  Result Value Ref Range   Vitamin B-12 >2000 (H) 200 - 1100 pg/mL      Assessment & Plan:   Problem List Items Addressed This Visit      Cardiovascular and Mediastinum   Hypertension    Goal is less than 130, very very close; limit salt etc        Endocrine   Diabetes mellitus without complication (HCC)    Check A1c today; urine test UTD; foot exam by MD today; today's number of A1c is off of metformin since May      Relevant Orders   US Carotid Duplex Bilateral   Lipid panel   Hemoglobin A1c     Musculoskeletal and Integument   Osteoporosis    Likely reaction to reclast; will check Cr, calcium, and vit D        Other   Vitamin D deficiency    Continue 2,000 iu daily for now      Relevant Orders   VITAMIN D 25 Hydroxy (Vit-D Deficiency, Fractures)   Problem of left ear    No signs of infection, but may have had inner ear infection last week; swim ear or keep ear canal dry      Medication monitoring encounter    Check liver and kidneys      Relevant Orders   COMPLETE METABOLIC PANEL WITH GFR   Hyperlipidemia    Mildly elev TG, low HDL; HDL may be genetic       Other Visit Diagnoses    Dizzinesses    -  Primary   may have been inner issue, but she has type 2 diabetes, dyslipidemia, will get carotid scan to r/o atherosclerosis   Relevant Orders   US Carotid Duplex Bilateral   Needs flu shot       Relevant Orders     Flu vaccine HIGH DOSE PF (Fluzone High dose) (Completed)   Need for vaccination for pneumococcus       Relevant Orders   Pneumococcal conjugate vaccine 13-valent (Completed)   Motion sickness, initial encounter       rx for scopolamine to take on cruise       Follow up plan: Return in about 3 months (around 02/14/2017) for twenty minute follow-up with fasting labs.  An after-visit summary was printed and given to the patient at check-out.  Please see the patient instructions which may contain other information and recommendations beyond what is mentioned above in the assessment and plan.  Meds ordered this encounter  Medications  . scopolamine (TRANSDERM-SCOP, 1.5 MG,) 1 MG/3DAYS    Sig: Place 1 patch (1.5 mg total) onto the skin every 3 (three) days.    Dispense:  4 patch    Refill:  0    Orders Placed This Encounter  Procedures  .  US Carotid Duplex Bilateral  . Flu vaccine HIGH DOSE PF (Fluzone High dose)  . Pneumococcal conjugate vaccine 13-valent  . Lipid panel  . Hemoglobin A1c  . COMPLETE METABOLIC PANEL WITH GFR  . VITAMIN D 25 Hydroxy (Vit-D Deficiency, Fractures)

## 2016-11-14 NOTE — Patient Instructions (Addendum)
Let's get labs today If you have not heard anything from my staff in a week about any orders/referrals/studies from today, please contact us here to follow-up [phone]    Stress and Stress Management Stress is a normal reaction to life events. It is what you feel when life demands more than you are used to or more than you can handle. Some stress can be useful. For example, the stress reaction can help you catch the last bus of the day, study for a test, or meet a deadline at work. But stress that occurs too often or for too long can cause problems. It can affect your emotional health and interfere with relationships and normal daily activities. Too much stress can weaken your immune system and increase your risk for physical illness. If you already have a medical problem, stress can make it worse. What are the causes? All sorts of life events may cause stress. An event that causes stress for one person may not be stressful for another person. Major life events commonly cause stress. These may be positive or negative. Examples include losing your job, moving into a new home, getting married, having a baby, or losing a loved one. Less obvious life events may also cause stress, especially if they occur day after day or in combination. Examples include working long hours, driving in traffic, caring for children, being in debt, or being in a difficult relationship. What are the signs or symptoms? Stress may cause emotional symptoms including, the following:  Anxiety. This is feeling worried, afraid, on edge, overwhelmed, or out of control.  Anger. This is feeling irritated or impatient.  Depression. This is feeling sad, down, helpless, or guilty.  Difficulty focusing, remembering, or making decisions.  Stress may cause physical symptoms, including the following:  Aches and pains. These may affect your head, neck, back, stomach, or other areas of your body.  Tight muscles or clenched  jaw.  Low energy or trouble sleeping.  Stress may cause unhealthy behaviors, including the following:  Eating to feel better (overeating) or skipping meals.  Sleeping too little, too much, or both.  Working too much or putting off tasks (procrastination).  Smoking, drinking alcohol, or using drugs to feel better.  How is this diagnosed? Stress is diagnosed through an assessment by your health care provider. Your health care provider will ask questions about your symptoms and any stressful life events.Your health care provider will also ask about your medical history and may order blood tests or other tests. Certain medical conditions and medicine can cause physical symptoms similar to stress. Mental illness can cause emotional symptoms and unhealthy behaviors similar to stress. Your health care provider may refer you to a mental health professional for further evaluation. How is this treated? Stress management is the recommended treatment for stress.The goals of stress management are reducing stressful life events and coping with stress in healthy ways. Techniques for reducing stressful life events include the following:  Stress identification. Self-monitor for stress and identify what causes stress for you. These skills may help you to avoid some stressful events.  Time management. Set your priorities, keep a calendar of events, and learn to say "no." These tools can help you avoid making too many commitments.  Techniques for coping with stress include the following:  Rethinking the problem. Try to think realistically about stressful events rather than ignoring them or overreacting. Try to find the positives in a stressful situation rather than focusing on the negatives.  Exercise.  Physical exercise can release both physical and emotional tension. The key is to find a form of exercise you enjoy and do it regularly.  Relaxation techniques. These relax the body and mind. Examples  include yoga, meditation, tai chi, biofeedback, deep breathing, progressive muscle relaxation, listening to music, being out in nature, journaling, and other hobbies. Again, the key is to find one or more that you enjoy and can do regularly.  Healthy lifestyle. Eat a balanced diet, get plenty of sleep, and do not smoke. Avoid using alcohol or drugs to relax.  Strong support network. Spend time with family, friends, or other people you enjoy being around.Express your feelings and talk things over with someone you trust.  Counseling or talktherapy with a mental health professional may be helpful if you are having difficulty managing stress on your own. Medicine is typically not recommended for the treatment of stress.Talk to your health care provider if you think you need medicine for symptoms of stress. Follow these instructions at home:  Keep all follow-up visits as directed by your health care provider.  Take all medicines as directed by your health care provider. Contact a health care provider if:  Your symptoms get worse or you start having new symptoms.  You feel overwhelmed by your problems and can no longer manage them on your own. Get help right away if:  You feel like hurting yourself or someone else. This information is not intended to replace advice given to you by your health care provider. Make sure you discuss any questions you have with your health care provider. Document Released: 07/19/2000 Document Revised: 07/01/2015 Document Reviewed: 09/17/2012 Elsevier Interactive Patient Education  2017 [employer].

## 2016-11-14 NOTE — Assessment & Plan Note (Signed)
Check liver and kidneys 

## 2016-11-14 NOTE — Assessment & Plan Note (Signed)
Check A1c today; urine test UTD; foot exam by MD today; today's number of A1c is off of metformin since May

## 2016-11-14 NOTE — Assessment & Plan Note (Signed)
Continue 2,000 iu daily for now

## 2016-11-14 NOTE — Assessment & Plan Note (Signed)
Goal is less than 130, very very close; limit salt etc

## 2016-11-14 NOTE — Assessment & Plan Note (Signed)
No signs of infection, but may have had inner ear infection last week; swim ear or keep ear canal dry

## 2016-11-14 NOTE — Telephone Encounter (Signed)
Left voice message informing patient that the appointment for November has been cancelled per doctor lada

## 2016-11-14 NOTE — Telephone Encounter (Signed)
Thank you for checking She can cancel the November appointment Thank you

## 2016-11-15 ENCOUNTER — Telehealth: Payer: Self-pay | Admitting: Family Medicine

## 2016-11-15 ENCOUNTER — Other Ambulatory Visit: Payer: Self-pay | Admitting: Family Medicine

## 2016-11-15 LAB — COMPLETE METABOLIC PANEL WITH GFR
AG Ratio: 1.3 (calc) (ref 1.0–2.5)
ALBUMIN MSPROF: 4.2 g/dL (ref 3.6–5.1)
ALKALINE PHOSPHATASE (APISO): 71 U/L (ref 33–130)
ALT: 21 U/L (ref 6–29)
AST: 31 U/L (ref 10–35)
BILIRUBIN TOTAL: 0.5 mg/dL (ref 0.2–1.2)
BUN: 13 mg/dL (ref 7–25)
CHLORIDE: 98 mmol/L (ref 98–110)
CO2: 30 mmol/L (ref 20–32)
Calcium: 9.2 mg/dL (ref 8.6–10.4)
Creat: 0.75 mg/dL (ref 0.50–0.99)
GFR, Est African American: 97 mL/min/{1.73_m2} (ref >=60)
GFR, Est Non African American: 84 mL/min/{1.73_m2} (ref >=60)
GLUCOSE: 249 mg/dL — AB (ref 65–99)
Globulin: 3.3 g/dL (calc) (ref 1.9–3.7)
Potassium: 4.1 mmol/L (ref 3.5–5.3)
Sodium: 135 mmol/L (ref 135–146)
Total Protein: 7.5 g/dL (ref 6.1–8.1)

## 2016-11-15 LAB — LIPID PANEL
CHOL/HDL RATIO: 4.7 (calc) (ref <5.0)
Cholesterol: 159 mg/dL (ref <200)
HDL: 34 mg/dL — ABNORMAL LOW (ref >50)
LDL CHOLESTEROL (CALC): 99 mg/dL
NON-HDL CHOLESTEROL (CALC): 125 mg/dL (ref <130)
TRIGLYCERIDES: 165 mg/dL — AB (ref <150)

## 2016-11-15 LAB — VITAMIN D 25 HYDROXY (VIT D DEFICIENCY, FRACTURES): VIT D 25 HYDROXY: 45 ng/mL (ref 30–100)

## 2016-11-15 LAB — HEMOGLOBIN A1C
Hgb A1c MFr Bld: 11.1 % of total Hgb — ABNORMAL HIGH (ref <5.7)
Mean Plasma Glucose: 272 (calc)
eAG (mmol/L): 15.1 (calc)

## 2016-11-15 MED ORDER — PIOGLITAZONE HCL 15 MG PO TABS: 15.0000 mg | ORAL_TABLET | Freq: Every day | ORAL | 5 refills | Status: DC

## 2016-11-15 MED ORDER — EMPAGLIFLOZIN 10 MG PO TABS: 10.0000 mg | ORAL_TABLET | Freq: Every day | ORAL | 5 refills | Status: DC

## 2016-11-15 NOTE — Telephone Encounter (Signed)
I don't know the answer to the question about the infusion; my gut feeling is no, as this measures the average blood sugar over the last 3 months, and anything over just the last few weeks wouldn't drive it up this much This may just be progression of the disease; diabetes is progressive, so even if patients do every single thing right (eat healthy, maintain a healthy weight, exercise, etc.), the A1c will still go up and the diabetes will worsen over time

## 2016-11-15 NOTE — Telephone Encounter (Signed)
Pt said that she was called about her A1C being up. Says that she eats a lot of health food look un sweet tea, salads, and wants to know if the infusion that she got last week or the pain that she is feeling could make her sugar go up. She just can not understand why it is so high

## 2016-11-16 ENCOUNTER — Telehealth: Payer: Self-pay | Admitting: Family Medicine

## 2016-11-16 MED ORDER — GLIMEPIRIDE 2 MG PO TABS: 2.0000 mg | ORAL_TABLET | Freq: Every day | ORAL | 3 refills | Status: DC

## 2016-11-16 NOTE — Telephone Encounter (Signed)
Pt would like to inform you that she did not get the jardiance. The pharmacy was wanting $400 for it. Is it possible to send in something less expensive

## 2016-11-16 NOTE — Telephone Encounter (Signed)
New Rx sent to replace the Jardiance If her sugars start to come down and she feels like they are too low, stop the glimepiride; this is the one with potential for low sugars but it is really inexpensive (trade off) Call with any issues; thank you

## 2016-11-16 NOTE — Telephone Encounter (Signed)
Left a detail message with the new medicine instruction as well some side effects to look for and which pharmacy the new medication was sent to.

## 2016-11-16 NOTE — Telephone Encounter (Signed)
Patient notified

## 2016-11-20 ENCOUNTER — Ambulatory Visit
Admission: RE | Admit: 2016-11-20 | Discharge: 2016-11-20 | Disposition: A | Discharge status: Home / Self Care | Payer: Medicare Other | Source: Ambulatory Visit | Attending: Family Medicine | Admitting: Family Medicine

## 2016-11-20 DIAGNOSIS — E785 Hyperlipidemia, unspecified: Secondary | ICD-10-CM | POA: Insufficient documentation

## 2016-11-20 DIAGNOSIS — E119 Type 2 diabetes mellitus without complications: Secondary | ICD-10-CM

## 2016-11-20 DIAGNOSIS — R42 Dizziness and giddiness: Secondary | ICD-10-CM

## 2016-12-01 ENCOUNTER — Encounter: Payer: Self-pay | Admitting: Family Medicine

## 2016-12-01 ENCOUNTER — Telehealth: Payer: Self-pay | Admitting: Family Medicine

## 2016-12-01 DIAGNOSIS — R921 Mammographic calcification found on diagnostic imaging of breast: Secondary | ICD-10-CM | POA: Insufficient documentation

## 2016-12-01 HISTORY — DX: Mammographic calcification found on diagnostic imaging of breast: R92.1

## 2016-12-01 NOTE — Telephone Encounter (Signed)
Patient notified mammo has been ordered and she can call and setup an appt that works best for her.

## 2016-12-01 NOTE — Assessment & Plan Note (Signed)
Order diag mammo LEFT

## 2016-12-01 NOTE — Telephone Encounter (Signed)
Copied from CRM #[id number]. Topic: Referral - Request >> Nov 28, 2016  8:30 AM Windy KalataMichael, Taylor L, NT wrote: Reason for CRM: pt is stating she needs a request form for her to go to the breast center.  [facility] >> Nov 30, 2016  3:05 PM [place], [place], [place] wrote: Please place new order or decide if patient needs to come in for a visit. --------------------------- Due Dec 4th; will place order

## 2016-12-11 ENCOUNTER — Ambulatory Visit: Payer: Medicare Other | Admitting: Family Medicine

## 2016-12-15 DIAGNOSIS — G8929 Other chronic pain: Secondary | ICD-10-CM | POA: Diagnosis not present

## 2016-12-15 DIAGNOSIS — M1711 Unilateral primary osteoarthritis, right knee: Secondary | ICD-10-CM | POA: Diagnosis not present

## 2016-12-15 DIAGNOSIS — M25562 Pain in left knee: Secondary | ICD-10-CM | POA: Diagnosis not present

## 2016-12-26 ENCOUNTER — Telehealth: Payer: Self-pay | Admitting: Family Medicine

## 2016-12-26 DIAGNOSIS — R921 Mammographic calcification found on diagnostic imaging of breast: Secondary | ICD-10-CM

## 2016-12-26 NOTE — Assessment & Plan Note (Signed)
Order f/u breast imaging

## 2016-12-26 NOTE — Telephone Encounter (Signed)
-----   Message from Kerman PasseyMelinda P Aureliano Oshields, MD sent at 07/12/2016  3:42 PM EDT ----- Regarding: due in early December IMPRESSION: Probable benign calcifications in the left breast.  RECOMMENDATION: Short-term interval follow-up left mammogram in 6 months is recommended.

## 2016-12-26 NOTE — Telephone Encounter (Signed)
Reviewed chart; additional breast imaging already scheduled for Dec 11

## 2017-01-11 ENCOUNTER — Other Ambulatory Visit: Payer: Self-pay | Admitting: Family Medicine

## 2017-01-20 ENCOUNTER — Other Ambulatory Visit: Payer: Self-pay | Admitting: Family Medicine

## 2017-01-20 NOTE — Telephone Encounter (Signed)
Last K+ and Cr reviewed; Rx approved 

## 2017-02-13 DIAGNOSIS — H01003 Unspecified blepharitis right eye, unspecified eyelid: Secondary | ICD-10-CM | POA: Diagnosis not present

## 2017-02-16 ENCOUNTER — Encounter: Payer: Self-pay | Admitting: Family Medicine

## 2017-02-19 ENCOUNTER — Ambulatory Visit (INDEPENDENT_AMBULATORY_CARE_PROVIDER_SITE_OTHER): Payer: Medicare Other | Admitting: Family Medicine

## 2017-02-19 ENCOUNTER — Encounter: Payer: Self-pay | Admitting: Family Medicine

## 2017-02-19 VITALS — BP 150/92 | HR 68 | Temp 98.2°F | Ht 64.0 in | Wt 184.7 lb

## 2017-02-19 DIAGNOSIS — Z5181 Encounter for therapeutic drug level monitoring: Secondary | ICD-10-CM | POA: Diagnosis not present

## 2017-02-19 DIAGNOSIS — E669 Obesity, unspecified: Secondary | ICD-10-CM

## 2017-02-19 DIAGNOSIS — M2550 Pain in unspecified joint: Secondary | ICD-10-CM | POA: Diagnosis not present

## 2017-02-19 DIAGNOSIS — R143 Flatulence: Secondary | ICD-10-CM

## 2017-02-19 DIAGNOSIS — I77811 Abdominal aortic ectasia: Secondary | ICD-10-CM

## 2017-02-19 DIAGNOSIS — I10 Essential (primary) hypertension: Secondary | ICD-10-CM

## 2017-02-19 DIAGNOSIS — E782 Mixed hyperlipidemia: Secondary | ICD-10-CM

## 2017-02-19 DIAGNOSIS — E66811 Obesity, class 1: Secondary | ICD-10-CM

## 2017-02-19 DIAGNOSIS — E119 Type 2 diabetes mellitus without complications: Secondary | ICD-10-CM | POA: Diagnosis not present

## 2017-02-19 DIAGNOSIS — L4050 Arthropathic psoriasis, unspecified: Secondary | ICD-10-CM

## 2017-02-19 MED ORDER — PIOGLITAZONE HCL 15 MG PO TABS: 15.0000 mg | ORAL_TABLET | Freq: Every day | ORAL | 3 refills | Status: DC

## 2017-02-19 MED ORDER — LISINOPRIL 10 MG PO TABS: 10.0000 mg | ORAL_TABLET | Freq: Every day | ORAL | 3 refills | Status: DC

## 2017-02-19 MED ORDER — CHLORTHALIDONE 50 MG PO TABS: 50.0000 mg | ORAL_TABLET | Freq: Every day | ORAL | 3 refills | Status: DC

## 2017-02-19 MED ORDER — OMEPRAZOLE 20 MG PO CPDR
DELAYED_RELEASE_CAPSULE | ORAL | 0 refills | Status: DC
Start: 2017-02-19 — End: 2018-01-10

## 2017-02-19 NOTE — Assessment & Plan Note (Signed)
April 2020, rescan; goal LDL is less than 70

## 2017-02-19 NOTE — Progress Notes (Signed)
BP (!) 150/92 (BP Location: Right Arm, Patient Position: Sitting, Cuff Size: Normal)   Pulse 68   Temp 98.2 F (36.8 C) (Oral)   Ht 5\' 4"  (1.626 m)   Wt 184 lb 11.2 oz (83.8 kg)   SpO2 97%   BMI 31.70 kg/m    Subjective:    Patient ID: Sandy Salinas, female    DOB: 02/05/1951, [age] y.o.   MRN: [id number]  HPI: Sandy Salinas is a 67 y.o. female  Chief Complaint  Patient presents with  . Follow-up    HPI Type 2 diabetes; sugars have been dropping recently, going down since October; eating like a pig; she has not adjusted her medicines Checking sugars rarely, just when symptomatic Recently saw eye doctor, "fine", just some crusting left lid, doing baby shampoo lid scrubs; eye itches and feels like a little stye starting No problems with feet Lots of gas from below; no broccoli; beans occasionally; anything; different smell, stinks High blood pressure; checks BP occasionally; 140 at home High cholesterol; on statin; no muscle aches or pains, nothing out of the ordinary; just aches all the time; thinks it is old age; hard time walking; RA in the family Osteoporosis; sees rheum Cannot take aspirin secondary to hx of gastric bypass Just taking PPI once a day Has pain in the RUQ, one month ago; gone now; did not follow certain foods; felt like she pulled something, along lower costal margin  Depression screen [facility] 2/[facility] 11/14/2016 09/11/2016 06/12/2016 06/06/2016 05/12/2016  Decreased Interest 0 0 0 0 0  Down, Depressed, Hopeless 0 0 0 0 0  PHQ - 2 Score 0 0 0 0 0    Relevant past medical, surgical, family and social history reviewed Past Medical History:  Diagnosis Date  . 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 Breast calcification seen on mammogram 12/01/2016   LEFT  . Diabetes mellitus without complication (HCC)   . Family history of abdominal aortic aneurysm (AAA) 05/12/2016   father  . Hyperlipidemia   . Hypertension    . Obesity   . Osteoporosis   . Primary osteoarthritis of left knee    Past Surgical History:  Procedure Laterality Date  . ABDOMINAL HYSTERECTOMY  1975   total  . BREAST BIOPSY Right 04/09/2012   FIBROADENOMA WITH COARSE CALCIFICATIONS.done by byrnett  . CYSTECTOMY  2003  . ROUX-EN-Y GASTRIC BYPASS  2009  . TONSILLECTOMY  1970   Family History  Problem Relation Age of Onset  . Alzheimer's disease Mother   . Heart murmur Mother   . Dementia Mother   . AAA (abdominal aortic aneurysm) Father   . Heart disease Father   . Asthma Sister   . Hypertension Sister   . Cancer Sister        breast  . Breast cancer Sister        late 5540's  . Heart disease Brother   . Hypertension Brother   . Aneurysm Paternal Aunt   . Cancer Paternal Grandmother        possible, not sure what kind   Social History   Tobacco Use  . Smoking status: Never Smoker  . Smokeless tobacco: Never Used  Substance Use Topics  . Alcohol use: No  . Drug use: No   Interim medical history since last visit reviewed. Allergies and medications reviewed  Review of Systems Per HPI unless specifically indicated above  Objective:    BP (!) 150/92 (BP Location: Right Arm, Patient Position: Sitting, Cuff Size: Normal)   Pulse 68   Temp 98.2 F (36.8 C) (Oral)   Ht 5\' 4"  (1.626 m)   Wt 184 lb 11.2 oz (83.8 kg)   SpO2 97%   BMI 31.70 kg/m   Wt Readings from Last 3 Encounters:  02/19/17 184 lb 11.2 oz (83.8 kg)  11/14/16 173 lb 3.2 oz (78.6 kg)  09/11/16 174 lb (78.9 kg)    Physical Exam  Constitutional: She appears well-developed and well-nourished.  HENT:  Right Ear: Tympanic membrane and ear canal normal.  Left Ear: Tympanic membrane normal.  Mouth/Throat: Oropharynx is clear and moist and mucous membranes are normal. Mucous membranes are not dry.  No swelling  Eyes:  Slight edema of the upper left eyelid with scant amount of mattering  Neck: Carotid bruit is not present.  Pulmonary/Chest:  Effort normal and breath sounds normal. She has no wheezes.  Abdominal: Soft. Normal appearance and bowel sounds are normal. There is no tenderness. There is negative Murphy's sign.  Musculoskeletal:       Left knee: She exhibits normal range of motion, no swelling and no deformity.  Skin: She is not diaphoretic. No pallor.  Psychiatric: Her mood appears not anxious. Her speech is not rapid and/or pressured. She does not exhibit a depressed mood.  Good eye contact with examiner   Diabetic Foot Form - Detailed   Diabetic Foot Exam - detailed Diabetic Foot exam was performed with the following findings:  Yes 02/19/2017  8:51 AM  Visual Foot Exam completed.:  Yes  Pulse Foot Exam completed.:  Yes  Right Dorsalis Pedis:  Present Left Dorsalis Pedis:  Present  Sensory Foot Exam Completed.:  Yes Semmes-Weinstein Monofilament Test R Site 1-Great Toe:  Pos L Site 1-Great Toe:  Pos         Results for orders placed or performed in visit on 11/14/16  Lipid panel  Result Value Ref Range   Cholesterol 159 <200 mg/dL   HDL 34 (L) >16 mg/dL   Triglycerides 109 (H) <150 mg/dL   LDL Cholesterol (Calc) 99 mg/dL (calc)   Total CHOL/HDL Ratio 4.7 <5.0 (calc)   Non-HDL Cholesterol (Calc) 125 <130 mg/dL (calc)  Hemoglobin U0A  Result Value Ref Range   Hgb A1c MFr Bld 11.1 (H) <5.7 % of total Hgb   Mean Plasma Glucose 272 (calc)   eAG (mmol/L) 15.1 (calc)  COMPLETE METABOLIC PANEL WITH GFR  Result Value Ref Range   Glucose, Bld 249 (H) 65 - 99 mg/dL   BUN 13 7 - 25 mg/dL   Creat 5.40 9.81 - 1.91 mg/dL   GFR, Est Non African American 84 > OR = 60 mL/min/1.25m2   GFR, Est African American 97 > OR = 60 mL/min/1.55m2   BUN/Creatinine Ratio NOT APPLICABLE 6 - 22 (calc)   Sodium 135 135 - 146 mmol/L   Potassium 4.1 3.5 - 5.3 mmol/L   Chloride 98 98 - 110 mmol/L   CO2 30 20 - 32 mmol/L   Calcium 9.2 8.6 - 10.4 mg/dL   Total Protein 7.5 6.1 - 8.1 g/dL   Albumin 4.2 3.6 - 5.1 g/dL   Globulin 3.3  1.9 - 3.7 g/dL (calc)   AG Ratio 1.3 1.0 - 2.5 (calc)   Total Bilirubin 0.5 0.2 - 1.2 mg/dL   Alkaline phosphatase (APISO) 71 33 - 130 U/L   AST 31 10 - 35 U/L  ALT 21 6 - 29 U/L  VITAMIN D 25 Hydroxy (Vit-D Deficiency, Fractures)  Result Value Ref Range   Vit D, 25-Hydroxy 45 30 - 100 ng/mL      Assessment & Plan:   Problem List Items Addressed This Visit      Cardiovascular and Mediastinum   Hypertension    Increase chlorthalidone; return in 2 weeks for BMP; if not to goal, increase ACE-I; DASH guidelines      Relevant Medications   chlorthalidone (HYGROTON) 50 MG tablet   lisinopril (PRINIVIL,ZESTRIL) 10 MG tablet   Abdominal aortic ectasia (HCC)    April 2020, rescan; goal LDL is less than 70      Relevant Medications   chlorthalidone (HYGROTON) 50 MG tablet   lisinopril (PRINIVIL,ZESTRIL) 10 MG tablet     Endocrine   Diabetes mellitus without complication (HCC)    Foot exam UTD, eye exam UTD; stop the sulfonylurea; recent cortisone in November to knee; limit sugars      Relevant Medications   lisinopril (PRINIVIL,ZESTRIL) 10 MG tablet   pioglitazone (ACTOS) 15 MG tablet   Other Relevant Orders   COMPLETE METABOLIC PANEL WITH GFR   Hemoglobin A1c     Musculoskeletal and Integument   Psoriatic arthritis (HCC)    See rheum for that        Other   Obesity (BMI 30.0-34.9)    Encourage 6000 steps a day at least, build up slowly and gradually      Medication monitoring encounter    Check liver and kidneys      Relevant Orders   COMPLETE METABOLIC PANEL WITH GFR   Basic Metabolic Panel (BMET)   Hyperlipidemia    Check lipids, continue statin      Relevant Medications   chlorthalidone (HYGROTON) 50 MG tablet   lisinopril (PRINIVIL,ZESTRIL) 10 MG tablet   Other Relevant Orders   Lipid panel    Other Visit Diagnoses    Arthralgia, unspecified joint    -  Primary   Relevant Orders   ANA,IFA RA Diag Pnl w/rflx Tit/Patn   C-reactive protein   Cyclic  citrul peptide antibody, IgG   Excessive gas       avoid certain foods, see AVS; try probiotics       Follow up plan: Return in about 2 weeks (around 03/05/2017) for blood pressure recheck with CMA with a BMP.  An after-visit summary was printed and given to the patient at check-out.  Please see the patient instructions which may contain other information and recommendations beyond what is mentioned above in the assessment and plan.  Meds ordered this encounter  Medications  . chlorthalidone (HYGROTON) 50 MG tablet    Sig: Take 1 tablet (50 mg total) by mouth daily.    Dispense:  90 tablet    Refill:  3    Increasing dose  . lisinopril (PRINIVIL,ZESTRIL) 10 MG tablet    Sig: Take 1 tablet (10 mg total) by mouth daily.    Dispense:  90 tablet    Refill:  3  . omeprazole (PRILOSEC) 20 MG capsule    Sig: One by mouth five days per week, skip Wed and Sun    Dispense:  90 capsule    Refill:  0  . pioglitazone (ACTOS) 15 MG tablet    Sig: Take 1 tablet (15 mg total) by mouth daily.    Dispense:  90 tablet    Refill:  3    Orders Placed This Encounter  Procedures  . COMPLETE METABOLIC PANEL WITH GFR  . Hemoglobin A1c  . Lipid panel  . ANA,IFA RA Diag Pnl w/rflx Tit/Patn  . C-reactive protein  . Cyclic citrul peptide antibody, IgG  . Basic Metabolic Panel (BMET)

## 2017-02-19 NOTE — Assessment & Plan Note (Signed)
Check liver and kidneys 

## 2017-02-19 NOTE — Assessment & Plan Note (Signed)
See rheum for that

## 2017-02-19 NOTE — Assessment & Plan Note (Signed)
Foot exam UTD, eye exam UTD; stop the sulfonylurea; recent cortisone in November to knee; limit sugars

## 2017-02-19 NOTE — Assessment & Plan Note (Signed)
Increase chlorthalidone; return in 2 weeks for BMP; if not to goal, increase ACE-I; DASH guidelines

## 2017-02-19 NOTE — Assessment & Plan Note (Signed)
Check lipids, continue statin 

## 2017-02-19 NOTE — Assessment & Plan Note (Signed)
Encourage 6000 steps a day at least, build up slowly and gradually

## 2017-02-19 NOTE — Patient Instructions (Addendum)
STOP the glimepiride Increase the chlorthalidone to 50 mg daily Avoid bagels, bread, broccoli, beans (cauliflower, other related foods) Probiotics for a few weeks Pay attention to any new discomfort under the right ribs and call if symptomatic Avoid greasy foods Please do see your eye doctor regularly, and have your eyes examined every year (or more often per his or her recommendation) Check your feet every night and let me know right away of any sores, infections, numbness, etc. Try to limit sweets, white bread, white rice, white potatoes It is okay with me for you to not check your fingerstick blood sugars (per [employer] Best Practices), unless you are interested and feel it would be helpful for you  DASH Eating Plan DASH stands for "Dietary Approaches to Stop Hypertension." The DASH eating plan is a healthy eating plan that has been shown to reduce high blood pressure (hypertension). It may also reduce your risk for type 2 diabetes, heart disease, and stroke. The DASH eating plan may also help with weight loss. What are tips for following this plan? General guidelines  Avoid eating more than 2,300 mg (milligrams) of salt (sodium) a day. If you have hypertension, you may need to reduce your sodium intake to 1,500 mg a day.  Limit alcohol intake to no more than 1 drink a day for nonpregnant women and 2 drinks a day for men. One drink equals 12 oz of beer, 5 oz of wine, or 1 oz of hard liquor.  Work with your health care provider to maintain a healthy body weight or to lose weight. Ask what an ideal weight is for you.  Get at least 30 minutes of exercise that causes your heart to beat faster (aerobic exercise) most days of the week. Activities may include walking, swimming, or biking.  Work with your health care provider or diet and nutrition specialist (dietitian) to adjust your eating plan to your individual calorie needs. Reading food labels  Check food labels  for the amount of sodium per serving. Choose foods with less than 5 percent of the Daily Value of sodium. Generally, foods with less than 300 mg of sodium per serving fit into this eating plan.  To find whole grains, look for the word "whole" as the first word in the ingredient list. Shopping  Buy products labeled as "low-sodium" or "no salt added."  Buy fresh foods. Avoid canned foods and premade or frozen meals. Cooking  Avoid adding salt when cooking. Use salt-free seasonings or herbs instead of table salt or sea salt. Check with your health care provider or pharmacist before using salt substitutes.  Do not fry foods. Cook foods using healthy methods such as baking, boiling, grilling, and broiling instead.  Cook with heart-healthy oils, such as olive, canola, soybean, or sunflower oil. Meal planning   Eat a balanced diet that includes: ? 5 or more servings of fruits and vegetables each day. At each meal, try to fill half of your plate with fruits and vegetables. ? Up to 6-8 servings of whole grains each day. ? Less than 6 oz of lean meat, poultry, or fish each day. A 3-oz serving of meat is about the same size as a deck of cards. One egg equals 1 oz. ? 2 servings of low-fat dairy each day. ? A serving of nuts, seeds, or beans 5 times each week. ? Heart-healthy fats. Healthy fats called Omega-3 fatty acids are found in foods such as flaxseeds and coldwater fish, like sardines, salmon,  and mackerel.  Limit how much you eat of the following: ? Canned or prepackaged foods. ? Food that is high in trans fat, such as fried foods. ? Food that is high in saturated fat, such as fatty meat. ? Sweets, desserts, sugary drinks, and other foods with added sugar. ? Full-fat dairy products.  Do not salt foods before eating.  Try to eat at least 2 vegetarian meals each week.  Eat more home-cooked food and less restaurant, buffet, and fast food.  When eating at a restaurant, ask that your food  be prepared with less salt or no salt, if possible. What foods are recommended? The items listed may not be a complete list. Talk with your dietitian about what dietary choices are best for you. Grains Whole-grain or whole-wheat bread. Whole-grain or whole-wheat pasta. Brown rice. Orpah Cobbatmeal. Quinoa. Bulgur. Whole-grain and low-sodium cereals. Pita bread. Low-fat, low-sodium crackers. Whole-wheat flour tortillas. Vegetables Fresh or frozen vegetables (raw, steamed, roasted, or grilled). Low-sodium or reduced-sodium tomato and vegetable juice. Low-sodium or reduced-sodium tomato sauce and tomato paste. Low-sodium or reduced-sodium canned vegetables. Fruits All fresh, dried, or frozen fruit. Canned fruit in natural juice (without added sugar). Meat and other protein foods Skinless chicken or Malawiturkey. Ground chicken or Mal[place]urkey. Pork with fat trimmed off. Fish and seafood. Egg whites. Dried beans, peas, or lentils. Unsalted nuts, nut butters, and seeds. Unsalted canned beans. Lean cuts of beef with fat trimmed off. Low-sodium, lean deli meat. Dairy Low-fat (1%) or fat-free (skim) milk. Fat-free, low-fat, or reduced-fat cheeses. Nonfat, low-sodium ricotta or cottage cheese. Low-fat or nonfat yogurt. Low-fat, low-sodium cheese. Fats and oils Soft margarine without trans fats. Vegetable oil. Low-fat, reduced-fat, or light mayonnaise and salad dressings (reduced-sodium). Canola, safflower, olive, soybean, and sunflower oils. Avocado. Seasoning and other foods Herbs. Spices. Seasoning mixes without salt. Unsalted popcorn and pretzels. Fat-free sweets. What foods are not recommended? The items listed may not be a complete list. Talk with your dietitian about what dietary choices are best for you. Grains Baked goods made with fat, such as croissants, muffins, or some breads. Dry pasta or rice meal packs. Vegetables Creamed or fried vegetables. Vegetables in a cheese sauce. Regular canned vegetables (not  low-sodium or reduced-sodium). Regular canned tomato sauce and paste (not low-sodium or reduced-sodium). Regular tomato and vegetable juice (not low-sodium or reduced-sodium). Rosita FirePickles. Olives. Fruits Canned fruit in a light or heavy syrup. Fried fruit. Fruit in cream or butter sauce. Meat and other protein foods Fatty cuts of meat. Ribs. Fried meat. Tomasa BlaseBacon. Sausage. Bologna and other processed lunch meats. Salami. Fatback. Hotdogs. Bratwurst. Salted nuts and seeds. Canned beans with added salt. Canned or smoked fish. Whole eggs or egg yolks. Chicken or Malaw[place]urkey with skin. Dairy Whole or 2% milk, cream, and half-and-half. Whole or full-fat cream cheese. Whole-fat or sweetened yogurt. Full-fat cheese. Nondairy creamers. Whipped toppings. Processed cheese and cheese spreads. Fats and oils Butter. Stick margarine. Lard. Shortening. Ghee. Bacon fat. Tropical oils, such as coconut, palm kernel, or palm oil. Seasoning and other foods Salted popcorn and pretzels. Onion salt, garlic salt, seasoned salt, table salt, and sea salt. Worcestershire sauce. Tartar sauce. Barbecue sauce. Teriyaki sauce. Soy sauce, including reduced-sodium. Steak sauce. Canned and packaged gravies. Fish sauce. Oyster sauce. Cocktail sauce. Horseradish that you find on the shelf. Ketchup. Mustard. Meat flavorings and tenderizers. Bouillon cubes. Hot sauce and Tabasco sauce. Premade or packaged marinades. Premade or packaged taco seasonings. Relishes. Regular salad dressings. Where to find more information:  National Heart,  Lung, and Blood Institute: PopSteam.is  American Heart Association: www.heart.org Summary  The DASH eating plan is a healthy eating plan that has been shown to reduce high blood pressure (hypertension). It may also reduce your risk for type 2 diabetes, heart disease, and stroke.  With the DASH eating plan, you should limit salt (sodium) intake to 2,300 mg a day. If you have hypertension, you may need to reduce  your sodium intake to 1,500 mg a day.  When on the DASH eating plan, aim to eat more fresh fruits and vegetables, whole grains, lean proteins, low-fat dairy, and heart-healthy fats.  Work with your health care provider or diet and nutrition specialist (dietitian) to adjust your eating plan to your individual calorie needs. This information is not intended to replace advice given to you by your health care provider. Make sure you discuss any questions you have with your health care provider. Document Released: 01/12/2011 Document Revised: 01/17/2016 Document Reviewed: 01/17/2016 Elsevier Interactive Patient Education  [employer].

## 2017-02-20 ENCOUNTER — Telehealth: Payer: Self-pay | Admitting: Family Medicine

## 2017-02-20 LAB — ANA,IFA RA DIAG PNL W/RFLX TIT/PATN
Anti Nuclear Antibody(ANA): NEGATIVE
Cyclic Citrullin Peptide Ab: 16 UNITS
Rhuematoid fact SerPl-aCnc: <14 IU/mL (ref <14)

## 2017-02-20 LAB — COMPLETE METABOLIC PANEL WITH GFR
AG Ratio: 1.3 (calc) (ref 1.0–2.5)
ALBUMIN MSPROF: 4.2 g/dL (ref 3.6–5.1)
ALKALINE PHOSPHATASE (APISO): 39 U/L (ref 33–130)
ALT: 13 U/L (ref 6–29)
AST: 24 U/L (ref 10–35)
BILIRUBIN TOTAL: 0.5 mg/dL (ref 0.2–1.2)
BUN: 18 mg/dL (ref 7–25)
CHLORIDE: 101 mmol/L (ref 98–110)
CO2: 33 mmol/L — AB (ref 20–32)
Calcium: 9.5 mg/dL (ref 8.6–10.4)
Creat: 0.91 mg/dL (ref 0.50–0.99)
GFR, Est African American: 76 mL/min/{1.73_m2} (ref >=60)
GFR, Est Non African American: 66 mL/min/{1.73_m2} (ref >=60)
GLUCOSE: 101 mg/dL — AB (ref 65–99)
Globulin: 3.2 g/dL (calc) (ref 1.9–3.7)
Potassium: 4.4 mmol/L (ref 3.5–5.3)
SODIUM: 141 mmol/L (ref 135–146)
Total Protein: 7.4 g/dL (ref 6.1–8.1)

## 2017-02-20 LAB — HEMOGLOBIN A1C
HEMOGLOBIN A1C: 7.4 %{Hb} — AB (ref <5.7)
Mean Plasma Glucose: 166 (calc)
eAG (mmol/L): 9.2 (calc)

## 2017-02-20 LAB — LIPID PANEL
CHOLESTEROL: 149 mg/dL (ref <200)
HDL: 42 mg/dL — AB (ref >50)
LDL Cholesterol (Calc): 84 mg/dL (calc)
Non-HDL Cholesterol (Calc): 107 mg/dL (calc) (ref <130)
Total CHOL/HDL Ratio: 3.5 (calc) (ref <5.0)
Triglycerides: 135 mg/dL (ref <150)

## 2017-02-20 LAB — C-REACTIVE PROTEIN: CRP: 0.6 mg/L (ref <8.0)

## 2017-02-20 NOTE — Telephone Encounter (Signed)
Can you please clarify for me so that I can call pt. Thanks!

## 2017-02-20 NOTE — Telephone Encounter (Signed)
Encourage her to look at her after visit summary The very first thing I wrote was to stop the glimepiride

## 2017-02-20 NOTE — Telephone Encounter (Signed)
Copied from CRM [phone]. Topic: Quick Communication - See Telephone Encounter >> Feb 20, 2017  8:16 AM Diana EvesHoyt, Maryann B wrote: CRM for notification. See Telephone encounter for:  Pt was in the office yesterday 02/19/17 and has a question about the medication glimepride 2 mg. She is wondering if she needs to be taking this medication due to it making her sugar drop and that was the reason for her appt yesterday was low sugar. Also on her list of medications it shows she needs to be taking metformin she doesn't have that prescription.  02/20/17.

## 2017-02-20 NOTE — Telephone Encounter (Signed)
Called pt no answer. LM for pt informing her to 1.Review her AVS for medication instructions 2. Stop glimepiride  3. Also informed pt of lab results per Dr.Lada's result note.   CRM created. [facility]

## 2017-02-21 ENCOUNTER — Telehealth: Payer: Self-pay

## 2017-02-21 NOTE — Telephone Encounter (Signed)
-----   Message from Kerman PasseyMelinda P Lada, MD sent at 02/20/2017  5:09 PM EST ----- Please let pt know that her ANA and test for rheumatoid arthritis are negative. Thank you

## 2017-02-21 NOTE — Telephone Encounter (Signed)
Called pt no answer. LM for pt informing her of lab results. CRM created. [facility]

## 2017-02-27 ENCOUNTER — Ambulatory Visit
Admission: RE | Admit: 2017-02-27 | Discharge: 2017-02-27 | Disposition: A | Discharge status: Home / Self Care | Payer: Medicare Other | Source: Ambulatory Visit | Attending: Family Medicine | Admitting: Family Medicine

## 2017-02-27 DIAGNOSIS — R921 Mammographic calcification found on diagnostic imaging of breast: Secondary | ICD-10-CM | POA: Diagnosis not present

## 2017-03-05 ENCOUNTER — Ambulatory Visit (INDEPENDENT_AMBULATORY_CARE_PROVIDER_SITE_OTHER): Payer: Medicare Other

## 2017-03-05 VITALS — BP 124/78 | HR 58

## 2017-03-05 DIAGNOSIS — Z5181 Encounter for therapeutic drug level monitoring: Secondary | ICD-10-CM

## 2017-03-05 DIAGNOSIS — I10 Essential (primary) hypertension: Secondary | ICD-10-CM

## 2017-03-06 ENCOUNTER — Telehealth: Payer: Self-pay

## 2017-03-06 LAB — BASIC METABOLIC PANEL
BUN: 15 mg/dL (ref 7–25)
CALCIUM: 9.6 mg/dL (ref 8.6–10.4)
CO2: 32 mmol/L (ref 20–32)
Chloride: 99 mmol/L (ref 98–110)
Creat: 0.78 mg/dL (ref 0.50–0.99)
GLUCOSE: 142 mg/dL — AB (ref 65–99)
Potassium: 4.6 mmol/L (ref 3.5–5.3)
SODIUM: 140 mmol/L (ref 135–146)

## 2017-03-06 NOTE — Telephone Encounter (Signed)
-----   Message from Melinda P Lada, MD sent at 03/06/2017 11:25 AM EST ----- Please let pt know that her kidneys are very happy with the medicine, and her electrolytes are all normal; thank you 

## 2017-03-07 ENCOUNTER — Other Ambulatory Visit: Payer: Self-pay | Admitting: Family Medicine

## 2017-03-07 NOTE — Telephone Encounter (Signed)
Called pt informed her of lab results, pt gave verbal understanding

## 2017-03-07 NOTE — Telephone Encounter (Signed)
-----   Message from Kerman PasseyMelinda P Lada, MD sent at 03/06/2017 11:25 AM EST ----- Please let pt know that her kidneys are very happy with the medicine, and her electrolytes are all normal; thank you

## 2017-03-21 NOTE — Progress Notes (Signed)
Note reviewed.

## 2017-03-26 NOTE — Progress Notes (Signed)
Closing out lab/order note open since:  May 2018 

## 2017-03-26 NOTE — Progress Notes (Signed)
Closing out lab/order note open since:  June 2018 

## 2017-03-26 NOTE — Progress Notes (Signed)
Closing out scanned document note open since  Jan 2019 Already wet signed

## 2017-04-20 ENCOUNTER — Other Ambulatory Visit: Payer: Self-pay | Admitting: Family Medicine

## 2017-04-22 ENCOUNTER — Other Ambulatory Visit: Payer: Self-pay | Admitting: Family Medicine

## 2017-04-22 NOTE — Telephone Encounter (Signed)
I sent a year's worth of lisinopril and chlorthalidone to the pharmacy in January Refills should not be needed yet; denied

## 2017-04-25 DIAGNOSIS — Z114 Encounter for screening for human immunodeficiency virus [HIV]: Secondary | ICD-10-CM | POA: Diagnosis not present

## 2017-04-25 DIAGNOSIS — L408 Other psoriasis: Secondary | ICD-10-CM | POA: Diagnosis not present

## 2017-04-25 DIAGNOSIS — L986 Other infiltrative disorders of the skin and subcutaneous tissue: Secondary | ICD-10-CM | POA: Diagnosis not present

## 2017-04-25 DIAGNOSIS — L308 Other specified dermatitis: Secondary | ICD-10-CM | POA: Diagnosis not present

## 2017-04-25 DIAGNOSIS — Z1159 Encounter for screening for other viral diseases: Secondary | ICD-10-CM | POA: Diagnosis not present

## 2017-04-25 DIAGNOSIS — Z79899 Other long term (current) drug therapy: Secondary | ICD-10-CM | POA: Diagnosis not present

## 2017-04-25 DIAGNOSIS — L409 Psoriasis, unspecified: Secondary | ICD-10-CM | POA: Diagnosis not present

## 2017-05-06 ENCOUNTER — Other Ambulatory Visit: Payer: Self-pay | Admitting: Family Medicine

## 2017-05-06 NOTE — Telephone Encounter (Signed)
I prescribed one year of actos on 02/19/17; note to pharmacy; too soon for new refills; use the 02/19/17 Rx pleaes

## 2017-05-15 ENCOUNTER — Ambulatory Visit (INDEPENDENT_AMBULATORY_CARE_PROVIDER_SITE_OTHER): Payer: Medicare Other | Admitting: Family Medicine

## 2017-05-15 ENCOUNTER — Encounter: Payer: Self-pay | Admitting: Family Medicine

## 2017-05-15 ENCOUNTER — Telehealth: Payer: Self-pay | Admitting: Family Medicine

## 2017-05-15 VITALS — BP 124/82 | HR 66 | Temp 98.3°F | Resp 14 | Ht 64.0 in | Wt 186.2 lb

## 2017-05-15 DIAGNOSIS — Z1211 Encounter for screening for malignant neoplasm of colon: Secondary | ICD-10-CM | POA: Diagnosis not present

## 2017-05-15 DIAGNOSIS — Z0000 Encounter for general adult medical examination without abnormal findings: Secondary | ICD-10-CM | POA: Diagnosis not present

## 2017-05-15 NOTE — Assessment & Plan Note (Signed)
USPSTF grade A and B recommendations reviewed with patient; age-appropriate recommendations, preventive care, screening tests, etc discussed and encouraged; healthy living encouraged; see AVS for patient education given to patient  

## 2017-05-15 NOTE — Progress Notes (Signed)
Patient: Sandy Salinas, Female    DOB: 02/21/1950, 67 y.o.   MRN: [id number]  Visit Date: 05/15/2017  Today's Provider: Baruch Gouty, MD   Chief Complaint  Patient presents with  . Medicare Wellness    Subjective:   Sandy Salinas is a 67 y.o. female who presents today for her Subsequent Annual Wellness Visit.  USPSTF grade A and B recommendations Depression:  Depression screen [facility] 2/9 05/15/2017 11/14/2016 09/11/2016 06/12/2016 06/06/2016  Decreased Interest 0 0 0 0 0  Down, Depressed, Hopeless 0 0 0 0 0  PHQ - 2 Score 0 0 0 0 0   Hypertension: BP Readings from Last 3 Encounters:  05/15/17 124/82  03/05/17 124/78  02/19/17 (!) 150/92   Obesity: Wt Readings from Last 3 Encounters:  05/15/17 186 lb 3.2 oz (84.5 kg)  02/19/17 184 lb 11.2 oz (83.8 kg)  11/14/16 173 lb 3.2 oz (78.6 kg)   BMI Readings from Last 3 Encounters:  05/15/17 31.96 kg/m  02/19/17 31.70 kg/m  11/14/16 29.73 kg/m    Skin cancer: no worrisome moles; seeing derm now for psoriasis Lung cancer:  Never smoker Breast cancer: UTD Colorectal cancer: will do the cologuard Cervical cancer screening: hyst HIV, hep B, hep C: not intersted STD testing and prevention (chl/gon/syphilis): not interested Intimate partner violence: no abuse Contraception: n/a Osteoporosis: May 2018; next due after May 2020 Fall prevention/vitamin D: discussed; taking vit D Immunizations: pneumonia shot PPSV-23 due in October or later Diet: fruits and veggies; salads; dark green leafy veggies Exercise: stay active Alcohol: no Tobacco use: no AAA: father did; already screening Aspirin: not taking, because of gastric bypass Glucose:  Glucose, Bld  Date Value Ref Range Status  03/05/2017 142 (H) 65 - 99 mg/dL Final    Comment:    .            Fasting reference interval . For someone without known diabetes, a glucose value >125 mg/dL indicates that they may have diabetes and this should be confirmed with a follow-up  test. .   02/19/2017 101 (H) 65 - 99 mg/dL Final    Comment:    .            Fasting reference interval . For someone without known diabetes, a glucose value between 100 and 125 mg/dL is consistent with prediabetes and should be confirmed with a follow-up test. .   11/14/2016 249 (H) 65 - 99 mg/dL Final    Comment:    .            Fasting reference interval . For someone without known diabetes, a glucose value >125 mg/dL indicates that they may have diabetes and this should be confirmed with a follow-up test. .    Lipids:  Lab Results  Component Value Date   CHOL 149 02/19/2017   CHOL 159 11/14/2016   CHOL 165 06/06/2016   Lab Results  Component Value Date   HDL 42 (L) 02/19/2017   HDL 34 (L) 11/14/2016   HDL 37 (L) 06/06/2016   Lab Results  Component Value Date   LDLCALC 84 02/19/2017   LDLCALC 99 11/14/2016   LDLCALC 93 06/06/2016   Lab Results  Component Value Date   TRIG 135 02/19/2017   TRIG 165 (H) 11/14/2016   TRIG 173 (H) 06/06/2016   Lab Results  Component Value Date   CHOLHDL 3.5 02/19/2017   CHOLHDL 4.7 11/14/2016   CHOLHDL 4.5 06/06/2016   No results found for: LDLDIRECT  Review of Systems  Past Medical History:  Diagnosis Date  . 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 Breast calcification seen on mammogram 12/01/2016   LEFT  . Diabetes mellitus without complication (HCC)   . Family history of abdominal aortic aneurysm (AAA) 05/12/2016   father  . Hyperlipidemia   . Hypertension   . Obesity   . Osteoporosis   . Primary osteoarthritis of left knee   . Psoriasis, unspecified     Past Surgical History:  Procedure Laterality Date  . ABDOMINAL HYSTERECTOMY  1975   total  . BREAST BIOPSY Right 04/09/2012   x 2, FIBROADENOMA WITH COARSE CALCIFICATIONS.done by byrnett  . CYSTECTOMY  2003  . ROUX-EN-Y GASTRIC BYPASS  2009  . TONSILLECTOMY  1970    Family History   Problem Relation Age of Onset  . Alzheimer's disease Mother   . Heart murmur Mother   . Dementia Mother   . AAA (abdominal aortic aneurysm) Father   . Heart disease Father   . Asthma Sister   . Hypertension Sister   . Cancer Sister        breast  . Breast cancer Sister        late 6740's  . Heart disease Brother   . Hypertension Brother   . Aneurysm Paternal Aunt   . Cancer Paternal Grandmother        possible, not sure what kind    Social History   Tobacco Use  . Smoking status: Never Smoker  . Smokeless tobacco: Never Used  Substance Use Topics  . Alcohol use: No  . Drug use: No    Outpatient Encounter Medications as of 05/15/2017  Medication Sig Note  . atorvastatin (LIPITOR) 40 MG tablet TAKE 1 TABLET(40 MG) BY MOUTH AT BEDTIME   . Calcium Carbonate-Vitamin D (CALTRATE 600+D PO) Take by mouth daily. 08/18/2014: [facility] Received Sig: Take by mouth.  . chlorthalidone (HYGROTON) 50 MG tablet Take 1 tablet (50 mg total) by mouth daily.   . Cholecalciferol (VITAMIN D) 2000 units tablet One by mouth three or four days per week   . lisinopril (PRINIVIL,ZESTRIL) 10 MG tablet Take 1 tablet (10 mg total) by mouth daily.   . metFORMIN (GLUCOPHAGE) 1000 MG tablet Take 2 tablets by mouth 2 (two) times daily. 11/14/2016: Refused on last lab review  . Multiple Vitamin (MULTIVITAMIN) tablet Take 1 tablet by mouth daily.   Marland Kitchen. omeprazole (PRILOSEC) 20 MG capsule One by mouth five days per week, skip Wed and Sun   . pioglitazone (ACTOS) 15 MG tablet Take 1 tablet (15 mg total) by mouth daily.   . traMADol (ULTRAM) 50 MG tablet Take 1 tablet (50 mg total) by mouth every 6 (six) hours as needed.   . TRANSDERM-SCOP, 1.5 MG, 1 MG/3DAYS UNWRAP AND APPLY 1 PATCH ONTO THE SKIN EVERY 3 DAYS   . TURMERIC PO Take 1 capsule by mouth at bedtime. 08/18/2014: [facility]  . vitamin B-12 (CYANOCOBALAMIN) 100 MCG tablet Take by mouth daily.    . vitamin E 100 UNIT capsule Take by mouth daily. Reported on 08/19/2015    No facility-administered encounter medications on file as of 05/15/2017.     Functional Ability / Safety Screening 1.  Was the timed Get Up and Go test less than 12 seconds?  yes 2.  Does the patient need help with the phone, transportation,  shopping,      preparing meals, housework, laundry, medications, or managing money?  no 3.  Does the patient's home have:  loose throw rugs in the hallway?   no      Grab bars in the bathroom? yes      Handrails on the stairs?   no stairs      Good lighting?   yes 4.  Has the patient noticed any hearing difficulties?   "selective"  Advanced Directives Does patient have a HCPOA?    yes If yes, name and contact information: husband Loraine Leriche, [phone] Does patient have a living will or MOST form?  yes Full code if present state of health; does not want to be kept alive on machines if terminal or incurable  Fall Risk Assessment See under rooming  Depression Screen See under rooming Depression screen [facility] 2/9 05/15/2017 11/14/2016 09/11/2016 06/12/2016 06/06/2016  Decreased Interest 0 0 0 0 0  Down, Depressed, Hopeless 0 0 0 0 0  PHQ - 2 Score 0 0 0 0 0    Objective:   Vitals: BP 124/82   Pulse 66   Temp 98.3 F (36.8 C) (Oral)   Resp 14   Ht 5\' 4"  (1.626 m)   Wt 186 lb 3.2 oz (84.5 kg)   SpO2 95%   BMI 31.96 kg/m  Body mass index is 31.96 kg/m. No exam data present  Physical Exam  Constitutional: She appears well-developed and well-nourished. No distress.  Cardiovascular: Normal rate.  Pulmonary/Chest: Effort normal.  Psychiatric: She has a normal mood and affect.   Mood/affect:  euthymic Appearance:  Casually dressed  6CIT Screen 05/15/2017  What Year? 0 points  What month? 0 points  What time? 0 points  Count back from 20 0 points  Months in reverse 0 points  Repeat phrase 0 points  Total Score 0    Assessment & Plan:     Annual Wellness  Visit  Reviewed patient's Family Medical History Reviewed and updated list of patient's medical providers Assessment of cognitive impairment was done Assessed patient's functional ability Established a written schedule for health screening services Health Risk Assessent Completed and Reviewed  Exercise Activities and Dietary recommendations Goals    . Weight (lb) < 170 lb (77.1 kg)       Immunization History  Administered Date(s) Administered  . Influenza, High Dose Seasonal PF 11/14/2016  . Influenza, Seasonal, Injecte, Preservative Fre 01/10/2007  . Influenza,inj,Quad PF,6+ Mos 10/14/2015  . Influenza-Unspecified 10/15/2013, 02/07/2015  . Pneumococcal Conjugate-13 11/14/2016  . Td 02/07/2004  . Tdap 04/14/2016    Health Maintenance  Topic Date Due  . Fecal DNA (Cologuard)  01/02/2001  . Hepatitis C Screening  05/16/2018 (Originally 03-04-1950)  . MAMMOGRAM  07/10/2017  . HEMOGLOBIN A1C  08/19/2017  . INFLUENZA VACCINE  09/06/2017  . PNA vac Low Risk Adult (2 of 2 - PPSV23) 11/14/2017  . OPHTHALMOLOGY EXAM  02/13/2018  . FOOT EXAM  02/19/2018  . TETANUS/TDAP  04/15/2026  . DEXA SCAN  Completed    Discussed health benefits of physical activity, and encouraged her to engage in regular exercise appropriate for her age and condition.   No orders of the defined types were placed in this encounter.   Current Outpatient Medications:  .  atorvastatin (LIPITOR) 40 MG tablet, TAKE 1 TABLET(40 MG) BY MOUTH AT BEDTIME, Disp: 90 tablet, Rfl: 0 .  Calcium Carbonate-Vitamin D (CALTRATE 600+D PO), Take by mouth daily., Disp: , Rfl:  .  chlorthalidone (HYGROTON) 50 MG tablet, Take 1 tablet (50 mg total) by mouth daily., Disp: 90 tablet, Rfl: 3 .  Cholecalciferol (VITAMIN D) 2000 units tablet, One by mouth three or four days per week, Disp: , Rfl:  .  lisinopril (PRINIVIL,ZESTRIL) 10 MG tablet, Take 1 tablet (10 mg total) by mouth daily., Disp: 90 tablet, Rfl: 3 .  metFORMIN  (GLUCOPHAGE) 1000 MG tablet, Take 2 tablets by mouth 2 (two) times daily., Disp: , Rfl:  .  Multiple Vitamin (MULTIVITAMIN) tablet, Take 1 tablet by mouth daily., Disp: , Rfl:  .  omeprazole (PRILOSEC) 20 MG capsule, One by mouth five days per week, skip Wed and Sun, Disp: 90 capsule, Rfl: 0 .  pioglitazone (ACTOS) 15 MG tablet, Take 1 tablet (15 mg total) by mouth daily., Disp: 90 tablet, Rfl: 3 .  traMADol (ULTRAM) 50 MG tablet, Take 1 tablet (50 mg total) by mouth every 6 (six) hours as needed., Disp: 20 tablet, Rfl: 0 .  TRANSDERM-SCOP, 1.5 MG, 1 MG/3DAYS, UNWRAP AND APPLY 1 PATCH ONTO THE SKIN EVERY 3 DAYS, Disp: 4 patch, Rfl: 0 .  TURMERIC PO, Take 1 capsule by mouth at bedtime., Disp: , Rfl:  .  vitamin B-12 (CYANOCOBALAMIN) 100 MCG tablet, Take by mouth daily., Disp: , Rfl:  .  vitamin E 100 UNIT capsule, Take by mouth daily. Reported on 08/19/2015, Disp: , Rfl:  There are no discontinued medications.  Next Medicare Wellness Visit in 12+ months  Problem List Items Addressed This Visit      Other   Medicare annual wellness visit, subsequent - Primary    USPSTF grade A and B recommendations reviewed with patient; age-appropriate recommendations, preventive care, screening tests, etc discussed and encouraged; healthy living encouraged; see AVS for patient education given to patient       Other Visit Diagnoses    Screen for colon cancer       Relevant Orders   Cologuard

## 2017-05-15 NOTE — Telephone Encounter (Signed)
ERRENOUS °

## 2017-05-15 NOTE — Patient Instructions (Addendum)
You can receive the other pneumonia vaccine on or after November 14, 2017 (PPSV-23) and that will be the last pneumonia vaccine you'll need per the ACIP guidelines Try Miralax or colace for constipation Check out the information at familydoctor.org entitled "Nutrition for Weight Loss: What You Need to Know about Fad Diets" Try to lose between 1-2 pounds per week by taking in fewer calories and burning off more calories You can succeed by limiting portions, limiting foods dense in calories and fat, becoming more active, and drinking 8 glasses of water a day (64 ounces) Don't skip meals, especially breakfast, as skipping meals may alter your metabolism Do not use over-the-counter weight loss pills or gimmicks that claim rapid weight loss A healthy BMI (or body mass index) is between 18.5 and 24.9 You can calculate your ideal BMI at the NIH website JobE[employer]ics.huhttp://www.nhlbi.nih.gov/health/educational/lose_wt/BMI/bmicalc.htm  Health Maintenance  Topic Date Due  . Fecal DNA (Cologuard)  01/02/2001  . Hepatitis C Screening  05/16/2018 (Originally 1950-06-04)  . MAMMOGRAM  07/10/2017  . HEMOGLOBIN A1C  08/19/2017  . INFLUENZA VACCINE  09/06/2017  . PNA vac Low Risk Adult (2 of 2 - PPSV23) 11/14/2017  . OPHTHALMOLOGY EXAM  02/13/2018  . FOOT EXAM  02/19/2018  . TETANUS/TDAP  04/15/2026  . DEXA SCAN  Completed

## 2017-06-04 DIAGNOSIS — Z1212 Encounter for screening for malignant neoplasm of rectum: Secondary | ICD-10-CM | POA: Diagnosis not present

## 2017-06-04 DIAGNOSIS — Z1211 Encounter for screening for malignant neoplasm of colon: Secondary | ICD-10-CM | POA: Diagnosis not present

## 2017-06-14 LAB — FECAL OCCULT BLOOD, GUAIAC: Fecal Occult Blood: NEGATIVE

## 2017-06-14 LAB — COLOGUARD: Cologuard: NEGATIVE

## 2017-06-28 ENCOUNTER — Telehealth: Payer: Self-pay | Admitting: Family Medicine

## 2017-06-28 NOTE — Telephone Encounter (Signed)
Copied from CRM [id number]. Topic: Quick Communication - See Telephone Encounter >> Jun 28, 2017  8:50 AM Rudi Coco, NT wrote: CRM for notification. See Telephone encounter for: 06/28/17.  Pt. Calling to get her results from her color guard test pt. Can be reached at [phone]

## 2017-06-28 NOTE — Telephone Encounter (Signed)
Pt notified of negative/normal result

## 2017-07-18 ENCOUNTER — Other Ambulatory Visit: Payer: Self-pay | Admitting: Family Medicine

## 2017-07-18 NOTE — Telephone Encounter (Signed)
Reviewed last sgpt and lipids; Rx approved 

## 2017-08-16 ENCOUNTER — Telehealth: Payer: Self-pay | Admitting: Family Medicine

## 2017-08-16 DIAGNOSIS — R921 Mammographic calcification found on diagnostic imaging of breast: Secondary | ICD-10-CM

## 2017-08-16 NOTE — Telephone Encounter (Signed)
Please let patient know that it's time to schedule her follow-up mammogram; please give her instructions to call and schedule; thank you  Orders Placed This Encounter  Procedures  . MM DIAG BREAST TOMO BILATERAL    Standing Status:   Future    Standing Expiration Date:   11/16/2017    Order Specific Question:   Reason for Exam (SYMPTOM  OR DIAGNOSIS REQUIRED)    Answer:   abnormal mammo Jan 2019    Order Specific Question:   Preferred imaging location?    Answer:   [facility]

## 2017-08-16 NOTE — Telephone Encounter (Signed)
-----   Message from Kerman PasseyMelinda P Lada, MD sent at 02/27/2017 11:09 AM EST ----- Regarding: order 6 month f/u breast imaging for July IMPRESSION: Stable probably benign left breast calcifications, for which continued six-month follow-up is recommended.  RECOMMENDATION: Bilateral diagnostic mammogram in 6 months.

## 2017-08-17 NOTE — Telephone Encounter (Signed)
Pt.notified

## 2017-08-28 ENCOUNTER — Ambulatory Visit
Admission: RE | Admit: 2017-08-28 | Discharge: 2017-08-28 | Disposition: A | Discharge status: Home / Self Care | Payer: Medicare Other | Source: Ambulatory Visit | Attending: Family Medicine | Admitting: Family Medicine

## 2017-08-28 ENCOUNTER — Other Ambulatory Visit: Payer: Self-pay | Admitting: Family Medicine

## 2017-08-28 DIAGNOSIS — R928 Other abnormal and inconclusive findings on diagnostic imaging of breast: Secondary | ICD-10-CM

## 2017-08-28 DIAGNOSIS — R921 Mammographic calcification found on diagnostic imaging of breast: Secondary | ICD-10-CM | POA: Diagnosis not present

## 2017-09-04 HISTORY — PX: BREAST BIOPSY: SHX20

## 2017-09-05 ENCOUNTER — Ambulatory Visit
Admission: RE | Admit: 2017-09-05 | Discharge: 2017-09-05 | Disposition: A | Discharge status: Home / Self Care | Payer: Medicare Other | Source: Ambulatory Visit | Attending: Family Medicine | Admitting: Family Medicine

## 2017-09-05 DIAGNOSIS — R928 Other abnormal and inconclusive findings on diagnostic imaging of breast: Secondary | ICD-10-CM

## 2017-09-05 DIAGNOSIS — R921 Mammographic calcification found on diagnostic imaging of breast: Secondary | ICD-10-CM

## 2017-09-06 LAB — SURGICAL PATHOLOGY

## 2017-10-24 DIAGNOSIS — M810 Age-related osteoporosis without current pathological fracture: Secondary | ICD-10-CM | POA: Diagnosis not present

## 2017-10-26 DIAGNOSIS — Z23 Encounter for immunization: Secondary | ICD-10-CM | POA: Diagnosis not present

## 2017-10-30 DIAGNOSIS — M1711 Unilateral primary osteoarthritis, right knee: Secondary | ICD-10-CM | POA: Diagnosis not present

## 2017-10-30 DIAGNOSIS — M5442 Lumbago with sciatica, left side: Secondary | ICD-10-CM | POA: Diagnosis not present

## 2017-10-30 DIAGNOSIS — M47816 Spondylosis without myelopathy or radiculopathy, lumbar region: Secondary | ICD-10-CM | POA: Diagnosis not present

## 2017-10-30 DIAGNOSIS — G8929 Other chronic pain: Secondary | ICD-10-CM | POA: Diagnosis not present

## 2017-10-30 DIAGNOSIS — M810 Age-related osteoporosis without current pathological fracture: Secondary | ICD-10-CM | POA: Diagnosis not present

## 2017-10-31 ENCOUNTER — Other Ambulatory Visit: Payer: Self-pay | Admitting: Internal Medicine

## 2017-10-31 DIAGNOSIS — M5136 Other intervertebral disc degeneration, lumbar region: Secondary | ICD-10-CM | POA: Diagnosis not present

## 2017-10-31 DIAGNOSIS — M47816 Spondylosis without myelopathy or radiculopathy, lumbar region: Secondary | ICD-10-CM | POA: Diagnosis not present

## 2017-10-31 DIAGNOSIS — M5416 Radiculopathy, lumbar region: Secondary | ICD-10-CM | POA: Diagnosis not present

## 2017-10-31 DIAGNOSIS — M5442 Lumbago with sciatica, left side: Principal | ICD-10-CM

## 2017-10-31 DIAGNOSIS — G8929 Other chronic pain: Secondary | ICD-10-CM

## 2017-11-06 ENCOUNTER — Ambulatory Visit: Payer: Medicare Other | Attending: Internal Medicine

## 2017-11-14 DIAGNOSIS — M810 Age-related osteoporosis without current pathological fracture: Secondary | ICD-10-CM | POA: Diagnosis not present

## 2017-11-15 ENCOUNTER — Other Ambulatory Visit: Payer: Self-pay | Admitting: Family Medicine

## 2017-11-15 MED ORDER — METFORMIN HCL 500 MG PO TABS: 500.0000 mg | ORAL_TABLET | Freq: Two times a day (BID) | ORAL | 1 refills | Status: DC

## 2017-11-15 NOTE — Progress Notes (Signed)
appt in the next week for diabetes

## 2017-11-20 ENCOUNTER — Ambulatory Visit (INDEPENDENT_AMBULATORY_CARE_PROVIDER_SITE_OTHER): Payer: Medicare Other | Admitting: Nurse Practitioner

## 2017-11-20 ENCOUNTER — Encounter: Payer: Self-pay | Admitting: Nurse Practitioner

## 2017-11-20 VITALS — BP 124/80 | HR 71 | Temp 98.1°F | Resp 12 | Ht 64.0 in | Wt 184.1 lb

## 2017-11-20 DIAGNOSIS — Z23 Encounter for immunization: Secondary | ICD-10-CM

## 2017-11-20 DIAGNOSIS — E119 Type 2 diabetes mellitus without complications: Secondary | ICD-10-CM | POA: Diagnosis not present

## 2017-11-20 DIAGNOSIS — E782 Mixed hyperlipidemia: Secondary | ICD-10-CM | POA: Diagnosis not present

## 2017-11-20 DIAGNOSIS — E669 Obesity, unspecified: Secondary | ICD-10-CM

## 2017-11-20 DIAGNOSIS — I10 Essential (primary) hypertension: Secondary | ICD-10-CM

## 2017-11-20 NOTE — Patient Instructions (Signed)
Foods and drinks to limit include  . fried foods and other foods high in saturated fat and trans fat  . foods high in salt, also called sodium  . sweets, such as baked goods, candy, and ice cream  . beverages with added sugars, such as juice, regular soda, and regular sports or energy drinks  Drink water instead of sweetened beverages. Consider using a sugar substitute in your coffee or tea.   Instead, eat carbohydrates from fruit, vegetables, whole grains, beans, and low-fat or nonfat milk. Choose healthy carbohydrates, such as fruit, vegetables, whole grains, beans, and low-fat milk, as part of your diabetes meal plan.  

## 2017-11-20 NOTE — Progress Notes (Signed)
Name: Sandy Salinas   MRN: [id number]    DOB: 30-Jul-1950   Date:11/20/2017       Progress Note  Subjective  Chief Complaint  Chief Complaint  Patient presents with  . Follow-up  . Diabetes    was started on metformin 2 pills but dropped sugar so pt is now taking 1 tablet    HPI  Patient presents for follow-up on diabetes.    A1C last year was 11.1 (had received a steroid shot) dropped to 7.4 at last visit on 02/19/2017. Patient is rx actos 15mg  daily and metformin 500mg  BID states hadn't been taking the metformin for years because it made her sugar drop. She re-started taking it this past week was taking two a day and had another episode of hypoglycemia in the 60's so went back down to one pill a day.  CBG between 60-170 averages around 140.   Hypertension: taking ACEi, diuretic daily BP Readings from Last 3 Encounters:  11/20/17 124/80  05/15/17 124/82  03/05/17 124/78   Hyperlipidemia: taking atorvastatin 40mg  nightly no issues  Patient Active Problem List   Diagnosis Date Noted  . Medicare annual wellness visit, subsequent 05/15/2017  . Obesity (BMI 30.0-34.9) 02/19/2017  . Chronic pain of left knee 12/15/2016  . Breast calcification seen on mammogram 12/01/2016  . Age-related osteoporosis without current pathological fracture 11/08/2016  . Osteoporosis 06/21/2016  . 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 Family history of abdominal aortic aneurysm (AAA) 05/12/2016  . Abnormal EKG 05/12/2016  . Preventative health care 05/12/2016  . Need for vaccination with 13-polyvalent pneumococcal conjugate vaccine 05/12/2016  . 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 Xerosis of skin 12/13/2015  . Psoriatic arthritis (HCC) 11/18/2015  . Lumbar radiculopathy, acute 10/14/2015  . Problem of left ear 07/01/2015  . Medication monitoring encounter 02/18/2015  . Vitamin D deficiency 08/18/2014  . Hyperlipidemia   .  Hypertension   . Primary osteoarthritis of left knee   . Diabetes mellitus without complication (HCC) 07/24/2014  . Status post bariatric surgery 07/24/2014    Past Medical History:  Diagnosis Date  . 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 Breast calcification seen on mammogram 12/01/2016   LEFT  . Diabetes mellitus without complication (HCC)   . Family history of abdominal aortic aneurysm (AAA) 05/12/2016   father  . Hyperlipidemia   . Hypertension   . Obesity   . Osteoporosis   . Primary osteoarthritis of left knee   . Psoriasis, unspecified     Past Surgical History:  Procedure Laterality Date  . ABDOMINAL HYSTERECTOMY  1975   total  . BREAST BIOPSY Right 04/09/2012   x 2, FIBROADENOMA WITH COARSE CALCIFICATIONS.done by byrnett  . BREAST BIOPSY Right 09/04/2017   Affirm Bx- X clip path pending  . CYSTECTOMY  2003  . ROUX-EN-Y GASTRIC BYPASS  2009  . TONSILLECTOMY  1970    Social History   Tobacco Use  . Smoking status: Never Smoker  . Smokeless tobacco: Never Used  Substance Use Topics  . Alcohol use: No     Current Outpatient Medications:  .  atorvastatin (LIPITOR) 40 MG tablet, TAKE 1 TABLET(40 MG) BY MOUTH AT BEDTIME, Disp: 90 tablet, Rfl: 1 .  Calcium Carbonate-Vitamin D (CALTRATE 600+D PO), Take by mouth daily., Disp: , Rfl:  .  chlorthalidone (HYGROTON) 50 MG tablet, Take 1 tablet (50 mg total)  by mouth daily., Disp: 90 tablet, Rfl: 3 .  Cholecalciferol (VITAMIN D) 2000 units tablet, One by mouth three or four days per week, Disp: , Rfl:  .  lisinopril (PRINIVIL,ZESTRIL) 10 MG tablet, Take 1 tablet (10 mg total) by mouth daily., Disp: 90 tablet, Rfl: 3 .  metFORMIN (GLUCOPHAGE) 500 MG tablet, Take 1 tablet (500 mg total) by mouth 2 (two) times daily with a meal. (Patient taking differently: Take 500 mg by mouth daily. ), Disp: 180 tablet, Rfl: 1 .  Multiple Vitamin (MULTIVITAMIN) tablet, Take 1 tablet  by mouth daily., Disp: , Rfl:  .  omeprazole (PRILOSEC) 20 MG capsule, One by mouth five days per week, skip Wed and Sun, Disp: 90 capsule, Rfl: 0 .  pioglitazone (ACTOS) 15 MG tablet, Take 1 tablet (15 mg total) by mouth daily., Disp: 90 tablet, Rfl: 3 .  TRANSDERM-SCOP, 1.5 MG, 1 MG/3DAYS, UNWRAP AND APPLY 1 PATCH ONTO THE SKIN EVERY 3 DAYS, Disp: 4 patch, Rfl: 0 .  TURMERIC PO, Take 1 capsule by mouth at bedtime., Disp: , Rfl:  .  vitamin B-12 (CYANOCOBALAMIN) 100 MCG tablet, Take by mouth daily., Disp: , Rfl:  .  vitamin E 100 UNIT capsule, Take by mouth daily. Reported on 08/19/2015, Disp: , Rfl:  .  traMADol (ULTRAM) 50 MG tablet, Take 1 tablet (50 mg total) by mouth every 6 (six) hours as needed. (Patient not taking: Reported on 11/20/2017), Disp: 20 tablet, Rfl: 0  Allergies  Allergen Reactions  . Codeine Nausea Only    Review of Systems  Constitutional: Positive for malaise/fatigue. Negative for chills and fever.  HENT: Negative for congestion, sinus pain and sore throat.   Eyes: Negative for blurred vision (sees eye doctor annually) and double vision.  Respiratory: Negative for cough and shortness of breath.   Cardiovascular: Negative for chest pain, palpitations and leg swelling.  Gastrointestinal: Negative for abdominal pain, blood in stool, constipation, diarrhea, nausea and vomiting.  Genitourinary: Negative for dysuria and hematuria.  Musculoskeletal: Positive for joint pain. Negative for falls and myalgias. Neck pain: arthritis, sees Dr. Renard Matter.  Skin: Negative for rash.  Neurological: Positive for headaches (occasionally). Negative for dizziness, tingling and weakness.  Psychiatric/Behavioral: Negative for depression. The patient is not nervous/anxious and does not have insomnia.       No other specific complaints in a complete review of systems (except as listed in HPI above).  Objective  Vitals:   11/20/17 1441  BP: 124/80  Pulse: 71  Resp: 12  Temp: 98.1 F  (36.7 C)  TempSrc: Oral  SpO2: 98%  Weight: 184 lb 1.6 oz (83.5 kg)  Height: 5\' 4"  (1.626 m)    Body mass index is 31.6 kg/m.  Nursing Note and Vital Signs reviewed.  Physical Exam  Constitutional: She is oriented to person, place, and time. She appears well-developed and well-nourished.  HENT:  Head: Normocephalic and atraumatic.  Cardiovascular: Normal rate, regular rhythm, normal heart sounds and intact distal pulses.  Pulmonary/Chest: Effort normal and breath sounds normal.  Abdominal: Soft. Bowel sounds are normal.  Neurological: She is alert and oriented to person, place, and time. Coordination normal.  Skin: Skin is warm and dry. She is not diaphoretic. No erythema.  Psychiatric: She has a normal mood and affect. Her behavior is normal. Judgment and thought content normal.       No results found for this or any previous visit (from the past 48 hour(s)).  Assessment & Plan  1. Diabetes mellitus  without complication (HCC) From fasting readings appears to be well controlled, discussed diet and activity. On ACEi for nephroprotection.  - Hemoglobin A1C - Urine Microalbumin w/creat. ratio  2. Need for pneumococcal vaccination - Pneumococcal polysaccharide vaccine 23-valent greater than or equal to 2yo subcutaneous/IM  3. Essential hypertension Well controlled, continue medication   4. Mixed hyperlipidemia Continue medication regimen, check lipids in 3 months  5. Obesity (BMI 30.0-34.9) Had bypass surgery, eats small portions, work on increasing carbs from fruits, veggies whole grains and beans instead of sweets and fried foods  Follow up in 3 months

## 2017-11-21 LAB — HEMOGLOBIN A1C
Hgb A1c MFr Bld: 8.5 % of total Hgb — ABNORMAL HIGH (ref <5.7)
Mean Plasma Glucose: 197 (calc)
eAG (mmol/L): 10.9 (calc)

## 2017-11-21 LAB — MICROALBUMIN / CREATININE URINE RATIO
CREATININE, URINE: 26 mg/dL (ref 20–275)
MICROALB/CREAT RATIO: 408 ug/mg{creat} — AB (ref <30)
Microalb, Ur: 10.6 mg/dL

## 2017-11-22 ENCOUNTER — Other Ambulatory Visit: Payer: Self-pay | Admitting: Nurse Practitioner

## 2017-11-22 DIAGNOSIS — E11649 Type 2 diabetes mellitus with hypoglycemia without coma: Secondary | ICD-10-CM

## 2017-11-23 ENCOUNTER — Telehealth: Payer: Self-pay | Admitting: Nurse Practitioner

## 2017-11-23 NOTE — Telephone Encounter (Signed)
-----   Message from Sandy Salinas, [place] sent at 11/22/2017  1:31 PM EDT ----- Patient called.  Patient aware. Patient states that she drinks plenty of water and eats small frequent meals regularly because of the Gastric Bypass. She also states that she that she has arm pain and redness from vaccine that she got when she was here.

## 2017-11-23 NOTE — Telephone Encounter (Signed)
With any medicine, including vaccines, there is a chance of side effects. These are usually mild and go away on their own. Most vaccine reactions are not serious: tenderness, redness, or swelling where the shot was given; or a mild fever. These happen soon after the shot is given and go away within a day or two. You can apply ice to the area for about 10-20 minutes 3-4 times a day and utilize gentle massage of the area.

## 2017-12-06 DIAGNOSIS — M810 Age-related osteoporosis without current pathological fracture: Secondary | ICD-10-CM | POA: Diagnosis not present

## 2017-12-06 DIAGNOSIS — M7052 Other bursitis of knee, left knee: Secondary | ICD-10-CM | POA: Diagnosis not present

## 2017-12-06 DIAGNOSIS — M1711 Unilateral primary osteoarthritis, right knee: Secondary | ICD-10-CM | POA: Diagnosis not present

## 2017-12-11 DIAGNOSIS — M5416 Radiculopathy, lumbar region: Secondary | ICD-10-CM | POA: Diagnosis not present

## 2017-12-11 DIAGNOSIS — M5136 Other intervertebral disc degeneration, lumbar region: Secondary | ICD-10-CM | POA: Diagnosis not present

## 2017-12-11 DIAGNOSIS — M47816 Spondylosis without myelopathy or radiculopathy, lumbar region: Secondary | ICD-10-CM | POA: Diagnosis not present

## 2017-12-13 ENCOUNTER — Telehealth: Payer: Self-pay | Admitting: Family Medicine

## 2017-12-13 NOTE — Telephone Encounter (Signed)
Low sugars; she has stopped metformin because of hypoglycemia Monitor sugars and contact me; can increase piog if needed  Lab Results  Component Value Date   HGBA1C 8.5 (H) 11/20/2017

## 2018-01-04 ENCOUNTER — Other Ambulatory Visit: Payer: Self-pay | Admitting: Family Medicine

## 2018-01-08 ENCOUNTER — Other Ambulatory Visit: Payer: Self-pay | Admitting: Family Medicine

## 2018-01-09 NOTE — Telephone Encounter (Signed)
Please call patient She is on BID PPI therapy Does she have hx of barretts? Seeing a GI doctor? Have bad reflux or GERD? This medicine can worsen osteoporosis so I want to decrease or change if possible Thank you

## 2018-01-10 NOTE — Telephone Encounter (Signed)
Left detailed voicemail

## 2018-01-10 NOTE — Telephone Encounter (Signed)
Patient states she is not on this med?

## 2018-01-10 NOTE — Telephone Encounter (Signed)
Thank you I've removed from med list and denied the refill request

## 2018-01-24 ENCOUNTER — Ambulatory Visit: Payer: Medicare Other | Admitting: Endocrinology

## 2018-01-25 ENCOUNTER — Other Ambulatory Visit: Payer: Self-pay | Admitting: Family Medicine

## 2018-02-15 ENCOUNTER — Encounter: Payer: Self-pay | Admitting: Nurse Practitioner

## 2018-02-15 ENCOUNTER — Ambulatory Visit (INDEPENDENT_AMBULATORY_CARE_PROVIDER_SITE_OTHER): Payer: Medicare Other | Admitting: Nurse Practitioner

## 2018-02-15 VITALS — BP 138/76 | HR 78 | Temp 98.4°F | Resp 16 | Ht 64.0 in | Wt 181.4 lb

## 2018-02-15 DIAGNOSIS — T148XXA Other injury of unspecified body region, initial encounter: Secondary | ICD-10-CM | POA: Diagnosis not present

## 2018-02-15 MED ORDER — KETOROLAC TROMETHAMINE 60 MG/2ML IM SOLN
60.0000 mg | Freq: Once | INTRAMUSCULAR | Status: AC
  Administered 2018-02-15: 30 mg via INTRAMUSCULAR

## 2018-02-15 MED ORDER — KETOROLAC TROMETHAMINE 30 MG/ML IJ SOLN: 30.0000 mg | Freq: Once | INTRAMUSCULAR | Status: DC

## 2018-02-15 MED ORDER — LIDOCAINE 5 % EX PTCH: 1.0000 | MEDICATED_PATCH | CUTANEOUS | 0 refills | Status: DC

## 2018-02-15 NOTE — Patient Instructions (Signed)
- Continue to take tylenol 500-1000mg  every 8 hours ( no more than 3,000mg /24 hours) - Continue to use heat on the area 20 minutes 4-5 times a day - Use lidocaine patch for 12 hours on and 12 hours off - Refer to chiropractor should receive call about appointment call us if you don't.   Muscle Strain A muscle strain is an injury that happens when a muscle is stretched longer than normal. This can happen during a fall, sports, or lifting. This can tear some muscle fibers. Usually, recovery from muscle strain takes 1-2 weeks. Complete healing normally takes 5-6 weeks. This condition is first treated with PRICE therapy. This involves:  Protecting your muscle from being injured again.  Resting your injured muscle.  Icing your injured muscle.  Applying pressure (compression) to your injured muscle. This may be done with a splint or elastic bandage.  Raising (elevating) your injured muscle. Your doctor may also recommend medicine for pain. Follow these instructions at home: If you have a splint:  Wear the splint as told by your doctor. Take it off only as told by your doctor.  Loosen the splint if your fingers or toes tingle, get numb, or turn cold and blue.  Keep the splint clean.  If the splint is not waterproof: ? Do not let it get wet. ? Cover it with a watertight covering when you take a bath or a shower. Managing pain, stiffness, and swelling   If directed, put ice on your injured area. ? If you have a removable splint, take it off as told by your doctor. ? Put ice in a plastic bag. ? Place a towel between your skin and the bag. ? Leave the ice on for 20 minutes, 2-3 times a day.  Move your fingers or toes often. This helps to avoid stiffness and lessen swelling.  Raise your injured area above the level of your heart while you are sitting or lying down.  Wear an elastic bandage as told by your doctor. Make sure it is not too tight. General instructions  Take  over-the-counter and prescription medicines only as told by your doctor.  Limit your activity. Rest your injured muscle as told by your doctor. Your doctor may say that gentle movements are okay.  If physical therapy was prescribed, do exercises as told by your doctor.  Do not put pressure on any part of the splint until it is fully hardened. This may take many hours.  Do not use any products that contain nicotine or tobacco, such as cigarettes and e-cigarettes. These can delay bone healing. If you need help quitting, ask your doctor.  Warm up before you exercise. This helps to prevent more muscle strains.  Ask your doctor when it is safe to drive if you have a splint.  Keep all follow-up visits as told by your doctor. This is important. Contact a doctor if:  You have more pain or swelling in your injured area. Get help right away if:  You have any of these problems in your injured area: ? You have numbness. ? You have tingling. ? You lose a lot of strength. Summary  A muscle strain is an injury that happens when a muscle is stretched longer than normal.  This condition is first treated with PRICE therapy. This includes protecting, resting, icing, adding pressure, and raising your injury.  Limit your activity. Rest your injured muscle as told by your doctor. Your doctor may say that gentle movements are okay.  Warm up before you exercise. This helps to prevent more muscle strains. This information is not intended to replace advice given to you by your health care provider. Make sure you discuss any questions you have with your health care provider. Document Released: 11/02/2007 Document Revised: 03/01/2016 Document Reviewed: 03/01/2016 Elsevier Interactive Patient Education  2019 [employer].

## 2018-02-15 NOTE — Progress Notes (Signed)
Name: Sandy Salinas   MRN: [id number]    DOB: [birth year]   Date:02/15/2018       Progress Note  Subjective  Chief Complaint  Chief Complaint  Patient presents with  . Back Pain    patient tried to help a neighbor and picked up a 108lb 89 yr ol lady.     HPI  Last week patient tried to pick up neighbor and had severe back pain. States mid back pain not relieved by tylenol, heating pad or gabapentin. Patient took tizanidine before bedtime without relief- make her very drowsy. She can bend but its hard to get up out of a chair, but once she gets standing she is good.  Has had gastric bypass previous cannot tolerate NSAIDs.   Patient Active Problem List   Diagnosis Date Noted  . Medicare annual wellness visit, subsequent 05/15/2017  . Obesity (BMI 30.0-34.9) 02/19/2017  . Chronic pain of left knee 12/15/2016  . Breast calcification seen on mammogram 12/01/2016  . Age-related osteoporosis without current pathological fracture 11/08/2016  . Osteoporosis 06/21/2016  . 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 Family history of abdominal aortic aneurysm (AAA) 05/12/2016  . Abnormal EKG 05/12/2016  . Preventative health care 05/12/2016  . Need for vaccination with 13-polyvalent pneumococcal conjugate vaccine 05/12/2016  . 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 Xerosis of skin 12/13/2015  . Psoriatic arthritis (HCC) 11/18/2015  . Lumbar radiculopathy, acute 10/14/2015  . Problem of left ear 07/01/2015  . Medication monitoring encounter 02/18/2015  . Vitamin D deficiency 08/18/2014  . Hyperlipidemia   . Hypertension   . Primary osteoarthritis of left knee   . Diabetes mellitus without complication (HCC) 07/24/2014  . Status post bariatric surgery 07/24/2014    Past Medical History:  Diagnosis Date  . 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 Breast calcification  seen on mammogram 12/01/2016   LEFT  . Diabetes mellitus without complication (HCC)   . Family history of abdominal aortic aneurysm (AAA) 05/12/2016   father  . Hyperlipidemia   . Hypertension   . Obesity   . Osteoporosis   . Primary osteoarthritis of left knee   . Psoriasis, unspecified     Past Surgical History:  Procedure Laterality Date  . ABDOMINAL HYSTERECTOMY  1975   total  . BREAST BIOPSY Right 04/09/2012   x 2, FIBROADENOMA WITH COARSE CALCIFICATIONS.done by byrnett  . BREAST BIOPSY Right 09/04/2017   Affirm Bx- X clip path pending  . CYSTECTOMY  2003  . ROUX-EN-Y GASTRIC BYPASS  2009  . TONSILLECTOMY  1970    Social History   Tobacco Use  . Smoking status: Never Smoker  . Smokeless tobacco: Never Used  Substance Use Topics  . Alcohol use: No     Current Outpatient Medications:  .  gabapentin (NEURONTIN) 100 MG capsule, Take by mouth., Disp: , Rfl:  .  tiZANidine (ZANAFLEX) 2 MG tablet, 1 tab twice a day, 30 days, Disp: , Rfl:  .  atorvastatin (LIPITOR) 40 MG tablet, TAKE 1 TABLET(40 MG) BY MOUTH AT BEDTIME, Disp: 90 tablet, Rfl: 0 .  Calcium Carbonate-Vitamin D (CALTRATE 600+D PO), Take by mouth daily., Disp: , Rfl:  .  chlorthalidone (HYGROTON) 50 MG tablet, Take 1 tablet (50 mg total) by mouth daily., Disp: 90 tablet, Rfl: 3 .  Cholecalciferol (VITAMIN D) 2000 units tablet, One by mouth three or four  days per week, Disp: , Rfl:  .  Cholecalciferol 50 MCG (2000 UT) TABS, Take by mouth., Disp: , Rfl:  .  lisinopril (PRINIVIL,ZESTRIL) 10 MG tablet, Take 1 tablet (10 mg total) by mouth daily., Disp: 90 tablet, Rfl: 3 .  Multiple Vitamin (MULTIVITAMIN) tablet, Take 1 tablet by mouth daily., Disp: , Rfl:  .  pioglitazone (ACTOS) 15 MG tablet, Take 1 tablet (15 mg total) by mouth daily., Disp: 90 tablet, Rfl: 3 .  TRANSDERM-SCOP, 1.5 MG, 1 MG/3DAYS, UNWRAP AND APPLY 1 PATCH ONTO THE SKIN EVERY 3 DAYS, Disp: 4 patch, Rfl: 0 .  TURMERIC PO, Take 1 capsule by mouth at  bedtime., Disp: , Rfl:  .  vitamin B-12 (CYANOCOBALAMIN) 100 MCG tablet, Take by mouth daily., Disp: , Rfl:  .  vitamin E 100 UNIT capsule, Take by mouth daily. Reported on 08/19/2015, Disp: , Rfl:   Allergies  Allergen Reactions  . Codeine Nausea Only  . Secukinumab Other (See Comments)    Made her throat raw for 2 months so is not taking this now    ROS   No other specific complaints in a complete review of systems (except as listed in HPI above).  Objective  Vitals:   02/15/18 1453  BP: 138/76  Pulse: 78  Resp: 16  Temp: 98.4 F (36.9 C)  TempSrc: Oral  SpO2: 98%  Weight: 181 lb 6.4 oz (82.3 kg)  Height: 5\' 4"  (1.626 m)    Body mass index is 31.14 kg/m.  Nursing Note and Vital Signs reviewed.  Physical Exam HENT:     Head: Normocephalic and atraumatic.     Right Ear: Hearing normal.     Left Ear: Hearing normal.     Nose: Nose normal.     Right Sinus: No maxillary sinus tenderness or frontal sinus tenderness.     Left Sinus: No maxillary sinus tenderness or frontal sinus tenderness.     Mouth/Throat:     Mouth: Mucous membranes are moist.     Pharynx: Uvula midline.  Neck:     Musculoskeletal: Normal range of motion.  Cardiovascular:     Rate and Rhythm: Normal rate.  Pulmonary:     Effort: Pulmonary effort is normal.  Musculoskeletal:     Lumbar back: She exhibits decreased range of motion, tenderness, pain and spasm. She exhibits no bony tenderness, no swelling, no edema and no deformity.       Back:  Lymphadenopathy:     Cervical: No cervical adenopathy.  Skin:    General: Skin is warm and dry.     Findings: No rash.  Neurological:     Mental Status: She is alert.  Psychiatric:        Mood and Affect: Mood normal.        Thought Content: Thought content normal.      No results found for this or any previous visit (from the past 48 hour(s)).  Assessment & Plan  1. Muscle strain Continue tylenol, muscle relaxer, heat. Unable to tolerate  PO NSAIDs due to gastric bypass.  - lidocaine (LIDODERM) 5 %; Place 1 patch onto the skin daily. Remove & Discard patch within 12 hours or as directed by MD  Dispense: 30 patch; Refill: 0 - Ambulatory referral to Chiropractic - ketorolac (TORADOL) 30 MG/ML injection 30 mg

## 2018-02-20 ENCOUNTER — Ambulatory Visit (INDEPENDENT_AMBULATORY_CARE_PROVIDER_SITE_OTHER): Payer: Medicare Other | Admitting: Family Medicine

## 2018-02-20 ENCOUNTER — Encounter: Payer: Self-pay | Admitting: Family Medicine

## 2018-02-20 VITALS — BP 118/66 | HR 66 | Temp 97.9°F | Ht 64.0 in | Wt 182.6 lb

## 2018-02-20 DIAGNOSIS — M545 Low back pain, unspecified: Secondary | ICD-10-CM

## 2018-02-20 DIAGNOSIS — E782 Mixed hyperlipidemia: Secondary | ICD-10-CM

## 2018-02-20 DIAGNOSIS — R809 Proteinuria, unspecified: Secondary | ICD-10-CM | POA: Diagnosis not present

## 2018-02-20 DIAGNOSIS — Z5181 Encounter for therapeutic drug level monitoring: Secondary | ICD-10-CM

## 2018-02-20 DIAGNOSIS — E1169 Type 2 diabetes mellitus with other specified complication: Secondary | ICD-10-CM

## 2018-02-20 DIAGNOSIS — E1129 Type 2 diabetes mellitus with other diabetic kidney complication: Secondary | ICD-10-CM | POA: Diagnosis not present

## 2018-02-20 DIAGNOSIS — I77811 Abdominal aortic ectasia: Secondary | ICD-10-CM

## 2018-02-20 DIAGNOSIS — E119 Type 2 diabetes mellitus without complications: Secondary | ICD-10-CM

## 2018-02-20 DIAGNOSIS — I10 Essential (primary) hypertension: Secondary | ICD-10-CM | POA: Diagnosis not present

## 2018-02-20 DIAGNOSIS — E669 Obesity, unspecified: Secondary | ICD-10-CM

## 2018-02-20 DIAGNOSIS — R635 Abnormal weight gain: Secondary | ICD-10-CM

## 2018-02-20 MED ORDER — METHOCARBAMOL 500 MG PO TABS: 500.0000 mg | ORAL_TABLET | Freq: Three times a day (TID) | ORAL | 0 refills | Status: DC | PRN

## 2018-02-20 NOTE — Assessment & Plan Note (Signed)
Showed patient the rise in the urine microalb:Cr; improtant to get A1c under control

## 2018-02-20 NOTE — Progress Notes (Signed)
BP 118/66   Pulse 66   Temp 97.9 F (36.6 C)   Ht 5\' 4"  (1.626 m)   Wt 182 lb 9.6 oz (82.8 kg)   SpO2 96%   BMI 31.34 kg/m    Subjective:    Patient ID: Sandy Salinas, female    DOB: 04/15/1950, 68 y.o.   MRN: [id number]  HPI: Sandy LovelyVeronica A Vergara is a 68 y.o. female  Chief Complaint  Patient presents with  . Follow-up  . Back Pain    HPI   Here for f/u but also having back pain  Two weeks ago, lifted neighbor and felt a pop in her lower back; didn't hurt right away; few days later, pain developed and spasms; no loss of B/B or lower ext control; saw NP and referred to chiropractor and has not heard back yet; tried muscle relaxer but too strong; gave her a shot of ketoprofen; took a few aspirins; however, cannot take NSAIDs b/c of bariatric surgery  Type 2 diabetes; checking sugars almost every day This morning 105; having some lows; 60 was lowest in last 2 weeks; gets some cheese crackers and that brings it up Due for eye exam and she will call; no eye problems Avoids all sweet drinks, just unsweetened tea and diet Coke and water; some white bread, sometimes wheat Last urine microalb:Cr had really increased  Lab Results  Component Value Date   HGBA1C 8.5 (H) 11/20/2017   Breast biopsy done in August 2019: ADDENDUM REPORT: 09/11/2017 17:06  ADDENDUM: Pathology of the left breast biopsy revealed a. LEFT BREAST, UPPER CENTRAL; STEREOTACTIC BIOPSY: FIBROADENOMATOUS CHANGE WITH CALCIFICATIONS. NEGATIVE FOR ATYPIA AND MALIGNANCY.  Low HDL; not "exercising" but does a lot of household chores (that counts I explained)  Lab Results  Component Value Date   CHOL 149 02/19/2017   HDL 42 (L) 02/19/2017   LDLCALC 84 02/19/2017   TRIG 135 02/19/2017   CHOLHDL 3.5 02/19/2017    Depression screen PHQ 2/9 02/20/2018 02/15/2018 11/20/2017 05/15/2017 11/14/2016  Decreased Interest 0 0 0 0 0  Down, Depressed, Hopeless 0 0 0 0 0  PHQ - 2 Score 0 0 0 0 0  Altered sleeping 0 -  0 - -  Tired, decreased energy 0 - 0 - -  Change in appetite 0 - 0 - -  Feeling bad or failure about yourself  0 - 0 - -  Trouble concentrating 0 - 0 - -  Moving slowly or fidgety/restless 0 - 0 - -  Suicidal thoughts 0 - 0 - -  PHQ-9 Score 0 - 0 - -  Difficult doing work/chores Not difficult at all - Not difficult at all - -   Fall Risk  02/20/2018 02/15/2018 11/20/2017 05/15/2017 11/14/2016  Falls in the past year? 1 1 No No Yes  Number falls in past yr: 1 1 - - 1  Injury with Fall? 0 0 - - No    Relevant past medical, surgical, family and social history reviewed Past Medical History:  Diagnosis Date  . 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 Breast calcification seen on mammogram 12/01/2016   LEFT  . Diabetes mellitus without complication (HCC)   . Family history of abdominal aortic aneurysm (AAA) 05/12/2016   father  . Hyperlipidemia   . Hypertension   . Obesity   . Osteoporosis   . Primary osteoarthritis of left knee   . Psoriasis, unspecified  Past Surgical History:  Procedure Laterality Date  . ABDOMINAL HYSTERECTOMY  1975   total  . BREAST BIOPSY Right 04/09/2012   x 2, FIBROADENOMA WITH COARSE CALCIFICATIONS.done by byrnett  . BREAST BIOPSY Right 09/04/2017   Affirm Bx- X clip path pending  . CYSTECTOMY  2003  . ROUX-EN-Y GASTRIC BYPASS  2009  . TONSILLECTOMY  1970   Family History  Problem Relation Age of Onset  . Alzheimer's disease Mother   . Heart murmur Mother   . Dementia Mother   . AAA (abdominal aortic aneurysm) Father   . Heart disease Father   . Asthma Sister   . Hypertension Sister   . Cancer Sister        breast  . Breast cancer Sister        late 42's  . Heart disease Brother   . Hypertension Brother   . Aneurysm Paternal Aunt   . Cancer Paternal Grandmother        possible, not sure what kind  . Lung cancer Paternal Uncle   . Cancer Cousin    Social History   Tobacco Use  .  Smoking status: Never Smoker  . Smokeless tobacco: Never Used  Substance Use Topics  . Alcohol use: No  . Drug use: No     [facility] Visit from 02/20/2018 in [facility]  AUDIT-C Score  0      Interim medical history since last visit reviewed. Allergies and medications reviewed  Review of Systems Per HPI unless specifically indicated above     Objective:    BP 118/66   Pulse 66   Temp 97.9 F (36.6 C)   Ht 5\' 4"  (1.626 m)   Wt 182 lb 9.6 oz (82.8 kg)   SpO2 96%   BMI 31.34 kg/m   Wt Readings from Last 3 Encounters:  02/20/18 182 lb 9.6 oz (82.8 kg)  02/15/18 181 lb 6.4 oz (82.3 kg)  11/20/17 184 lb 1.6 oz (83.5 kg)    Physical Exam Constitutional:      General: She is not in acute distress.    Appearance: She is well-developed. She is not diaphoretic.  HENT:     Head: Normocephalic and atraumatic.  Eyes:     General: No scleral icterus. Neck:     Thyroid: No thyromegaly.  Cardiovascular:     Rate and Rhythm: Normal rate and regular rhythm.     Heart sounds: Normal heart sounds. No murmur.  Pulmonary:     Effort: Pulmonary effort is normal. No respiratory distress.     Breath sounds: Normal breath sounds. No wheezing.  Abdominal:     General: Bowel sounds are normal. There is no distension.     Palpations: Abdomen is soft.  Musculoskeletal:       Back:  Skin:    General: Skin is warm and dry.     Coloration: Skin is not pale.  Neurological:     Mental Status: She is alert.     Deep Tendon Reflexes:     Reflex Scores:      Patellar reflexes are 2+ on the right side and 2+ on the left side.    Comments: LE strength 5/5   Psychiatric:        Behavior: Behavior normal.        Thought Content: Thought content normal.        Judgment: Judgment normal.    Diabetic Foot Form - Detailed   Diabetic Foot Exam -  detailed Diabetic Foot exam was performed with the following findings:  Yes 02/20/2018  8:18 AM  Visual Foot Exam completed.:  Yes    Pulse Foot Exam completed.:  Yes   Left Dorsalis Pedis:  Present  Sensory Foot Exam Completed.:  Yes Semmes-Weinstein Monofilament Test R Site 1-Great Toe:  Pos L Site 1-Great Toe:  Pos       Results for orders placed or performed in visit on 11/20/17  Hemoglobin A1C  Result Value Ref Range   Hgb A1c MFr Bld 8.5 (H) <5.7 % of total Hgb   Mean Plasma Glucose 197 (calc)   eAG (mmol/L) 10.9 (calc)  Urine Microalbumin w/creat. ratio  Result Value Ref Range   Creatinine, Urine 26 20 - 275 mg/dL   Microalb, Ur 09.810.6 mg/dL   Microalb Creat Ratio 408 (H) <30 mcg/mg creat      Assessment & Plan:   Problem List Items Addressed This Visit      Cardiovascular and Mediastinum   Hypertension (Chronic)    controlled      Abdominal aortic ectasia (HCC) (Chronic)    Due for rescan in April of this year      Relevant Orders   US Aorta     Endocrine   Microalbuminuria due to type 2 diabetes mellitus (HCC)    Showed patient the rise in the urine microalb:Cr; improtant to get A1c under control      Diabetes mellitus type 2 in obese [facility](HCC)    Due for eye exam; she will call for appt; foot exam by MD today; check A1c        Other   Obesity (BMI 30.0-34.9)    Encouraged weight loss; see AVS      Medication monitoring encounter    Monitor liver and kidneys periodically      Relevant Orders   COMPLETE METABOLIC PANEL WITH GFR   Hyperlipidemia    Limit saturated fats; check lipids today; exercise with chores, etc; no chest pain      Relevant Orders   Lipid panel    Other Visit Diagnoses    Acute bilateral low back pain without sciatica    -  Primary   cannot take NSAIDs;    Relevant Medications   methocarbamol (ROBAXIN) 500 MG tablet   Weight gain       Relevant Orders   TSH       Follow up plan: Return in about 3 months (around 05/22/2018) for follow-up visit with Dr. Sherie DonLada.  An after-visit summary was printed and given to the patient at check-out.  Please see the  patient instructions which may contain other information and recommendations beyond what is mentioned above in the assessment and plan.  Meds ordered this encounter  Medications  . methocarbamol (ROBAXIN) 500 MG tablet    Sig: Take 1 tablet (500 mg total) by mouth every 8 (eight) hours as needed for muscle spasms.    Dispense:  21 tablet    Refill:  0    Orders Placed This Encounter  Procedures  . US Aorta  . Microalbumin / creatinine urine ratio  . Lipid panel  . Hemoglobin A1c  . COMPLETE METABOLIC PANEL WITH GFR  . TSH

## 2018-02-20 NOTE — Assessment & Plan Note (Signed)
Limit saturated fats; check lipids today; exercise with chores, etc; no chest pain

## 2018-02-20 NOTE — Assessment & Plan Note (Signed)
Due for rescan in April of this year

## 2018-02-20 NOTE — Assessment & Plan Note (Addendum)
Encouraged weight loss; see AVS 

## 2018-02-20 NOTE — Assessment & Plan Note (Signed)
Due for eye exam; she will call for appt; foot exam by MD today; check A1c

## 2018-02-20 NOTE — Assessment & Plan Note (Signed)
Monitor liver and kidneys periodically 

## 2018-02-20 NOTE — Patient Instructions (Addendum)
Check out the information at familydoctor.org entitled "Nutrition for Weight Loss: What You Need to Know about Fad Diets" Try to lose between 1-2 pounds per week by taking in fewer calories and burning off more calories You can succeed by limiting portions, limiting foods dense in calories and fat, becoming more active, and drinking 8 glasses of water a day (64 ounces) Don't skip meals, especially breakfast, as skipping meals may alter your metabolism Do not use over-the-counter weight loss pills or gimmicks that claim rapid weight loss A healthy BMI (or body mass index) is between 18.5 and 24.9 You can calculate your ideal BMI at the NIH website http://www.nhlbi.nih.gov/health/educational/lose_wt/BMI/bmicalc.htm Try to limit saturated fats in your diet (bologna, hot dogs, barbeque, cheeseburgers, hamburgers, steak, bacon, sausage, cheese, etc.) and get more fresh fruits, vegetables, and whole grains  Obesity, Adult Obesity is the condition of having too much total body fat. Being overweight or obese means that your weight is greater than what is considered healthy for your body size. Obesity is determined by a measurement called BMI. BMI is an estimate of body fat and is calculated from height and weight. For adults, a BMI of 30 or higher is considered obese. Obesity can eventually lead to other health concerns and major illnesses, including:  Stroke.  Coronary artery disease (CAD).  Type 2 diabetes.  Some types of cancer, including cancers of the colon, breast, uterus, and gallbladder.  Osteoarthritis.  High blood pressure (hypertension).  High cholesterol.  Sleep apnea.  Gallbladder stones.  Infertility problems. What are the causes? The main cause of obesity is taking in (consuming) more calories than your body uses for energy. Other factors that contribute to this condition may include:  Being born with genes that make you more likely to become obese.  Having a medical  condition that causes obesity. These conditions include: ? Hypothyroidism. ? Polycystic ovarian syndrome (PCOS). ? Binge-eating disorder. ? Cushing syndrome.  Taking certain medicines, such as steroids, antidepressants, and seizure medicines.  Not being physically active (sedentary lifestyle).  Living where there are limited places to exercise safely or buy healthy foods.  Not getting enough sleep. What increases the risk? The following factors may increase your risk of this condition:  Having a family history of obesity.  Being a woman of African-American descent.  Being a man of Hispanic descent. What are the signs or symptoms? Having excessive body fat is the main symptom of this condition. How is this diagnosed? This condition may be diagnosed based on:  Your symptoms.  Your medical history.  A physical exam. Your health care provider may measure: ? Your BMI. If you are an adult with a BMI between 25 and less than 30, you are considered overweight. If you are an adult with a BMI of 30 or higher, you are considered obese. ? The distances around your hips and your waist (circumferences). These may be compared to each other to help diagnose your condition. ? Your skinfold thickness. Your health care provider may gently pinch a fold of your skin and measure it. How is this treated? Treatment for this condition often includes changing your lifestyle. Treatment may include some or all of the following:  Dietary changes. Work with your health care provider and a dietitian to set a weight-loss goal that is healthy and reasonable for you. Dietary changes may include eating: ? Smaller portions. A portion size is the amount of a particular food that is healthy for you to eat at one time. This   varies from person to person. ? Low-calorie or low-fat options. ? More whole grains, fruits, and vegetables.  Regular physical activity. This may include aerobic activity (cardio) and strength  training.  Medicine to help you lose weight. Your health care provider may prescribe medicine if you are unable to lose 1 pound a week after 6 weeks of eating more healthily and doing more physical activity.  Surgery. Surgical options may include gastric banding and gastric bypass. Surgery may be done if: ? Other treatments have not helped to improve your condition. ? You have a BMI of 40 or higher. ? You have life-threatening health problems related to obesity. Follow these instructions at home:  Eating and drinking   Follow recommendations from your health care provider about what you eat and drink. Your health care provider may advise you to: ? Limit fast foods, sweets, and processed snack foods. ? Choose low-fat options, such as low-fat milk instead of whole milk. ? Eat 5 or more servings of fruits or vegetables every day. ? Eat at home more often. This gives you more control over what you eat. ? Choose healthy foods when you eat out. ? Learn what a healthy portion size is. ? Keep low-fat snacks on hand. ? Avoid sugary drinks, such as soda, fruit juice, iced tea sweetened with sugar, and flavored milk. ? Eat a healthy breakfast.  Drink enough water to keep your urine clear or pale yellow.  Do not go without eating for long periods of time (do not fast) or follow a fad diet. Fasting and fad diets can be unhealthy and even dangerous. Physical Activity  Exercise regularly, as told by your health care provider. Ask your health care provider what types of exercise are safe for you and how often you should exercise.  Warm up and stretch before being active.  Cool down and stretch after being active.  Rest between periods of activity. Lifestyle  Limit the time that you spend in front of your TV, computer, or video game system.  Find ways to reward yourself that do not involve food.  Limit alcohol intake to no more than 1 drink a day for nonpregnant women and 2 drinks a day for  men. One drink equals 12 oz of beer, 5 oz of wine, or 1 oz of hard liquor. General instructions  Keep a weight loss journal to keep track of the food you eat and how much you exercise you get.  Take over-the-counter and prescription medicines only as told by your health care provider.  Take vitamins and supplements only as told by your health care provider.  Consider joining a support group. Your health care provider may be able to recommend a support group.  Keep all follow-up visits as told by your health care provider. This is important. Contact a health care provider if:  You are unable to meet your weight loss goal after 6 weeks of dietary and lifestyle changes. This information is not intended to replace advice given to you by your health care provider. Make sure you discuss any questions you have with your health care provider. Document Released: 03/02/2004 Document Revised: 06/28/2015 Document Reviewed: 11/11/2014 Elsevier Interactive Patient Education  2019 Elsevier Inc.  Preventing Unhealthy Weight Gain, Adult Staying at a healthy weight is important to your overall health. When fat builds up in your body, you may become overweight or obese. Being overweight or obese increases your risk of developing certain health problems, such as heart disease, diabetes, sleeping problems,   joint problems, and some types of cancer. Unhealthy weight gain is often the result of making unhealthy food choices or not getting enough exercise. You can make changes to your lifestyle to prevent obesity and stay as healthy as possible. What nutrition changes can be made?   Eat only as much as your body needs. To do this: ? Pay attention to signs that you are hungry or full. Stop eating as soon as you feel full. ? If you feel hungry, try drinking water first before eating. Drink enough water so your urine is clear or pale yellow. ? Eat smaller portions. Pay attention to portion sizes when eating  out. ? Look at serving sizes on food labels. Most foods contain more than one serving per container. ? Eat the recommended number of calories for your gender and activity level. For most active people, a daily total of 2,000 calories is appropriate. If you are trying to lose weight or are not very active, you may need to eat fewer calories. Talk with your health care provider or a diet and nutrition specialist (dietitian) about how many calories you need each day.  Choose healthy foods, such as: ? Fruits and vegetables. At each meal, try to fill at least half of your plate with fruits and vegetables. ? Whole grains, such as whole-wheat bread, brown rice, and quinoa. ? Lean meats, such as chicken or fish. ? Other healthy proteins, such as beans, eggs, or tofu. ? Healthy fats, such as nuts, seeds, fatty fish, and olive oil. ? Low-fat or fat-free dairy products.  Check food labels, and avoid food and drinks that: ? Are high in calories. ? Have added sugar. ? Are high in sodium. ? Have saturated fats or trans fats.  Cook foods in healthier ways, such as by baking, broiling, or grilling.  Make a meal plan for the week, and shop with a grocery list to help you stay on track with your purchases. Try to avoid going to the grocery store when you are hungry.  When grocery shopping, try to shop around the outside of the store first, where the fresh foods are. Doing this helps you to avoid prepackaged foods, which can be high in sugar, salt (sodium), and fat. What lifestyle changes can be made?   Exercise for 30 or more minutes on 5 or more days each week. Exercising may include brisk walking, yard work, biking, running, swimming, and team sports like basketball and soccer. Ask your health care provider which exercises are safe for you.  Do muscle-strengthening activities, such as lifting weights or using resistance bands, on 2 or more days a week.  Do not use any products that contain nicotine or  tobacco, such as cigarettes and e-cigarettes. If you need help quitting, ask your health care provider.  Limit alcohol intake to no more than 1 drink a day for nonpregnant women and 2 drinks a day for men. One drink equals 12 oz of beer, 5 oz of wine, or 1 oz of hard liquor.  Try to get 7-9 hours of sleep each night. What other changes can be made?  Keep a food and activity journal to keep track of: ? What you ate and how many calories you had. Remember to count the calories in sauces, dressings, and side dishes. ? Whether you were active, and what exercises you did. ? Your calorie, weight, and activity goals.  Check your weight regularly. Track any changes. If you notice you have gained weight, make changes   to your diet or activity routine.  Avoid taking weight-loss medicines or supplements. Talk to your health care provider before starting any new medicine or supplement.  Talk to your health care provider before trying any new diet or exercise plan. Why are these changes important? Eating healthy, staying active, and having healthy habits can help you to prevent obesity. Those changes also:  Help you manage stress and emotions.  Help you connect with friends and family.  Improve your self-esteem.  Improve your sleep.  Prevent long-term health problems. What can happen if changes are not made? Being obese or overweight can cause you to develop joint or bone problems, which can make it hard for you to stay active or do activities you enjoy. Being obese or overweight also puts stress on your heart and lungs and can lead to health problems like diabetes, heart disease, and some cancers. Where to find more information Talk with your health care provider or a dietitian about healthy eating and healthy lifestyle choices. You may also find information from:  U.S. Department of Agriculture, MyPlate: www.choosemyplate.gov  American Heart Association: www.heart.org  Centers for Disease  Control and Prevention: www.cdc.gov Summary  Staying at a healthy weight is important to your overall health. It helps you to prevent certain diseases and health problems, such as heart disease, diabetes, joint problems, sleep disorders, and some types of cancer.  Being obese or overweight can cause you to develop joint or bone problems, which can make it hard for you to stay active or do activities you enjoy.  You can prevent unhealthy weight gain by eating a healthy diet, exercising regularly, not smoking, limiting alcohol, and getting enough sleep.  Talk with your health care provider or a dietitian for guidance about healthy eating and healthy lifestyle choices. This information is not intended to replace advice given to you by your health care provider. Make sure you discuss any questions you have with your health care provider. Document Released: 01/25/2016 Document Revised: 11/03/2016 Document Reviewed: 03/01/2016 Elsevier Interactive Patient Education  2019 Elsevier Inc.  

## 2018-02-20 NOTE — Assessment & Plan Note (Signed)
controlled 

## 2018-02-21 ENCOUNTER — Other Ambulatory Visit: Payer: Self-pay | Admitting: Family Medicine

## 2018-02-21 LAB — COMPLETE METABOLIC PANEL WITH GFR
AG Ratio: 1.6 (calc) (ref 1.0–2.5)
ALT: 7 U/L (ref 6–29)
AST: 15 U/L (ref 10–35)
Albumin: 4.4 g/dL (ref 3.6–5.1)
Alkaline phosphatase (APISO): 43 U/L (ref 33–130)
BILIRUBIN TOTAL: 0.5 mg/dL (ref 0.2–1.2)
BUN: 16 mg/dL (ref 7–25)
CALCIUM: 9.4 mg/dL (ref 8.6–10.4)
CHLORIDE: 100 mmol/L (ref 98–110)
CO2: 30 mmol/L (ref 20–32)
Creat: 0.87 mg/dL (ref 0.50–0.99)
GFR, EST AFRICAN AMERICAN: 80 mL/min/{1.73_m2} (ref >=60)
GFR, EST NON AFRICAN AMERICAN: 69 mL/min/{1.73_m2} (ref >=60)
GLUCOSE: 108 mg/dL — AB (ref 65–99)
Globulin: 2.7 g/dL (calc) (ref 1.9–3.7)
Potassium: 4.2 mmol/L (ref 3.5–5.3)
Sodium: 138 mmol/L (ref 135–146)
TOTAL PROTEIN: 7.1 g/dL (ref 6.1–8.1)

## 2018-02-21 LAB — LIPID PANEL
Cholesterol: 142 mg/dL (ref <200)
HDL: 31 mg/dL — ABNORMAL LOW (ref >50)
LDL Cholesterol (Calc): 82 mg/dL (calc)
NON-HDL CHOLESTEROL (CALC): 111 mg/dL (ref <130)
Total CHOL/HDL Ratio: 4.6 (calc) (ref <5.0)
Triglycerides: 202 mg/dL — ABNORMAL HIGH (ref <150)

## 2018-02-21 LAB — MICROALBUMIN / CREATININE URINE RATIO: CREATININE, URINE: 39 mg/dL (ref 20–275)

## 2018-02-21 LAB — HEMOGLOBIN A1C
Hgb A1c MFr Bld: 7.3 % of total Hgb — ABNORMAL HIGH (ref <5.7)
MEAN PLASMA GLUCOSE: 163 (calc)
eAG (mmol/L): 9 (calc)

## 2018-02-21 LAB — TSH: TSH: 2.83 mIU/L (ref 0.40–4.50)

## 2018-02-21 MED ORDER — ATORVASTATIN CALCIUM 80 MG PO TABS: 80.0000 mg | ORAL_TABLET | Freq: Every day | ORAL | 1 refills | Status: DC

## 2018-02-21 NOTE — Progress Notes (Signed)
Sandy Salinas, please let the patient know that her urine test has improved quite a bit; good news Her diabetes is under much better control too; she brought her A1c down from 8.5 to 7.3, and that's including the holidays! Great work We are so close to getting that A1c under 7... does she want to buckle down on diet and walking and weight loss, or does she want me to adjust her medicine? Her LDL is not quite to goal; let's increase her atorvastatin from 40 mg to 80 mg (I sent a new Rx) and recheck her lipids at f/u in 3 months Her TG are high and her HDL is low, and I'll encourage her to correct those on her own with weight loss and diet Her other labs are okay

## 2018-02-25 DIAGNOSIS — M9905 Segmental and somatic dysfunction of pelvic region: Secondary | ICD-10-CM | POA: Diagnosis not present

## 2018-02-25 DIAGNOSIS — M955 Acquired deformity of pelvis: Secondary | ICD-10-CM | POA: Diagnosis not present

## 2018-02-25 DIAGNOSIS — M4306 Spondylolysis, lumbar region: Secondary | ICD-10-CM | POA: Diagnosis not present

## 2018-02-25 DIAGNOSIS — M9903 Segmental and somatic dysfunction of lumbar region: Secondary | ICD-10-CM | POA: Diagnosis not present

## 2018-02-26 ENCOUNTER — Other Ambulatory Visit: Payer: Self-pay | Admitting: Family Medicine

## 2018-02-26 DIAGNOSIS — M4306 Spondylolysis, lumbar region: Secondary | ICD-10-CM | POA: Diagnosis not present

## 2018-02-26 DIAGNOSIS — M9905 Segmental and somatic dysfunction of pelvic region: Secondary | ICD-10-CM | POA: Diagnosis not present

## 2018-02-26 DIAGNOSIS — M9903 Segmental and somatic dysfunction of lumbar region: Secondary | ICD-10-CM | POA: Diagnosis not present

## 2018-02-26 DIAGNOSIS — M955 Acquired deformity of pelvis: Secondary | ICD-10-CM | POA: Diagnosis not present

## 2018-02-26 MED ORDER — PIOGLITAZONE HCL 30 MG PO TABS: 30.0000 mg | ORAL_TABLET | Freq: Every day | ORAL | 5 refills | Status: DC

## 2018-02-27 DIAGNOSIS — M955 Acquired deformity of pelvis: Secondary | ICD-10-CM | POA: Diagnosis not present

## 2018-02-27 DIAGNOSIS — M9903 Segmental and somatic dysfunction of lumbar region: Secondary | ICD-10-CM | POA: Diagnosis not present

## 2018-02-27 DIAGNOSIS — M9905 Segmental and somatic dysfunction of pelvic region: Secondary | ICD-10-CM | POA: Diagnosis not present

## 2018-02-27 DIAGNOSIS — M4306 Spondylolysis, lumbar region: Secondary | ICD-10-CM | POA: Diagnosis not present

## 2018-03-01 DIAGNOSIS — M9903 Segmental and somatic dysfunction of lumbar region: Secondary | ICD-10-CM | POA: Diagnosis not present

## 2018-03-01 DIAGNOSIS — M9905 Segmental and somatic dysfunction of pelvic region: Secondary | ICD-10-CM | POA: Diagnosis not present

## 2018-03-01 DIAGNOSIS — M955 Acquired deformity of pelvis: Secondary | ICD-10-CM | POA: Diagnosis not present

## 2018-03-01 DIAGNOSIS — M4306 Spondylolysis, lumbar region: Secondary | ICD-10-CM | POA: Diagnosis not present

## 2018-03-04 DIAGNOSIS — M9905 Segmental and somatic dysfunction of pelvic region: Secondary | ICD-10-CM | POA: Diagnosis not present

## 2018-03-04 DIAGNOSIS — M4306 Spondylolysis, lumbar region: Secondary | ICD-10-CM | POA: Diagnosis not present

## 2018-03-04 DIAGNOSIS — M9903 Segmental and somatic dysfunction of lumbar region: Secondary | ICD-10-CM | POA: Diagnosis not present

## 2018-03-04 DIAGNOSIS — M955 Acquired deformity of pelvis: Secondary | ICD-10-CM | POA: Diagnosis not present

## 2018-03-08 DIAGNOSIS — M4306 Spondylolysis, lumbar region: Secondary | ICD-10-CM | POA: Diagnosis not present

## 2018-03-08 DIAGNOSIS — M9903 Segmental and somatic dysfunction of lumbar region: Secondary | ICD-10-CM | POA: Diagnosis not present

## 2018-03-08 DIAGNOSIS — M955 Acquired deformity of pelvis: Secondary | ICD-10-CM | POA: Diagnosis not present

## 2018-03-08 DIAGNOSIS — M9905 Segmental and somatic dysfunction of pelvic region: Secondary | ICD-10-CM | POA: Diagnosis not present

## 2018-03-12 DIAGNOSIS — M4306 Spondylolysis, lumbar region: Secondary | ICD-10-CM | POA: Diagnosis not present

## 2018-03-12 DIAGNOSIS — M955 Acquired deformity of pelvis: Secondary | ICD-10-CM | POA: Diagnosis not present

## 2018-03-12 DIAGNOSIS — M9903 Segmental and somatic dysfunction of lumbar region: Secondary | ICD-10-CM | POA: Diagnosis not present

## 2018-03-12 DIAGNOSIS — M9905 Segmental and somatic dysfunction of pelvic region: Secondary | ICD-10-CM | POA: Diagnosis not present

## 2018-03-13 DIAGNOSIS — M4306 Spondylolysis, lumbar region: Secondary | ICD-10-CM | POA: Diagnosis not present

## 2018-03-13 DIAGNOSIS — M9903 Segmental and somatic dysfunction of lumbar region: Secondary | ICD-10-CM | POA: Diagnosis not present

## 2018-03-13 DIAGNOSIS — M955 Acquired deformity of pelvis: Secondary | ICD-10-CM | POA: Diagnosis not present

## 2018-03-13 DIAGNOSIS — M9905 Segmental and somatic dysfunction of pelvic region: Secondary | ICD-10-CM | POA: Diagnosis not present

## 2018-03-15 DIAGNOSIS — M9903 Segmental and somatic dysfunction of lumbar region: Secondary | ICD-10-CM | POA: Diagnosis not present

## 2018-03-15 DIAGNOSIS — M9905 Segmental and somatic dysfunction of pelvic region: Secondary | ICD-10-CM | POA: Diagnosis not present

## 2018-03-15 DIAGNOSIS — M955 Acquired deformity of pelvis: Secondary | ICD-10-CM | POA: Diagnosis not present

## 2018-03-15 DIAGNOSIS — M4306 Spondylolysis, lumbar region: Secondary | ICD-10-CM | POA: Diagnosis not present

## 2018-03-19 DIAGNOSIS — M955 Acquired deformity of pelvis: Secondary | ICD-10-CM | POA: Diagnosis not present

## 2018-03-19 DIAGNOSIS — M4306 Spondylolysis, lumbar region: Secondary | ICD-10-CM | POA: Diagnosis not present

## 2018-03-19 DIAGNOSIS — M9905 Segmental and somatic dysfunction of pelvic region: Secondary | ICD-10-CM | POA: Diagnosis not present

## 2018-03-19 DIAGNOSIS — M9903 Segmental and somatic dysfunction of lumbar region: Secondary | ICD-10-CM | POA: Diagnosis not present

## 2018-03-20 DIAGNOSIS — M4306 Spondylolysis, lumbar region: Secondary | ICD-10-CM | POA: Diagnosis not present

## 2018-03-20 DIAGNOSIS — M955 Acquired deformity of pelvis: Secondary | ICD-10-CM | POA: Diagnosis not present

## 2018-03-20 DIAGNOSIS — M9905 Segmental and somatic dysfunction of pelvic region: Secondary | ICD-10-CM | POA: Diagnosis not present

## 2018-03-20 DIAGNOSIS — M9903 Segmental and somatic dysfunction of lumbar region: Secondary | ICD-10-CM | POA: Diagnosis not present

## 2018-03-21 DIAGNOSIS — M9903 Segmental and somatic dysfunction of lumbar region: Secondary | ICD-10-CM | POA: Diagnosis not present

## 2018-03-21 DIAGNOSIS — M4306 Spondylolysis, lumbar region: Secondary | ICD-10-CM | POA: Diagnosis not present

## 2018-03-21 DIAGNOSIS — M955 Acquired deformity of pelvis: Secondary | ICD-10-CM | POA: Diagnosis not present

## 2018-03-21 DIAGNOSIS — M9905 Segmental and somatic dysfunction of pelvic region: Secondary | ICD-10-CM | POA: Diagnosis not present

## 2018-03-25 DIAGNOSIS — M4306 Spondylolysis, lumbar region: Secondary | ICD-10-CM | POA: Diagnosis not present

## 2018-03-25 DIAGNOSIS — M9905 Segmental and somatic dysfunction of pelvic region: Secondary | ICD-10-CM | POA: Diagnosis not present

## 2018-03-25 DIAGNOSIS — M955 Acquired deformity of pelvis: Secondary | ICD-10-CM | POA: Diagnosis not present

## 2018-03-25 DIAGNOSIS — M9903 Segmental and somatic dysfunction of lumbar region: Secondary | ICD-10-CM | POA: Diagnosis not present

## 2018-03-27 DIAGNOSIS — M9903 Segmental and somatic dysfunction of lumbar region: Secondary | ICD-10-CM | POA: Diagnosis not present

## 2018-03-27 DIAGNOSIS — M4306 Spondylolysis, lumbar region: Secondary | ICD-10-CM | POA: Diagnosis not present

## 2018-03-27 DIAGNOSIS — M955 Acquired deformity of pelvis: Secondary | ICD-10-CM | POA: Diagnosis not present

## 2018-03-27 DIAGNOSIS — M9905 Segmental and somatic dysfunction of pelvic region: Secondary | ICD-10-CM | POA: Diagnosis not present

## 2018-04-05 ENCOUNTER — Other Ambulatory Visit: Payer: Self-pay | Admitting: Family Medicine

## 2018-04-05 MED ORDER — CHLORTHALIDONE 50 MG PO TABS: 50.0000 mg | ORAL_TABLET | Freq: Every day | ORAL | 3 refills | Status: DC

## 2018-04-05 NOTE — Telephone Encounter (Signed)
Lab Results  Component Value Date   CREATININE 0.87 02/20/2018   Lab Results  Component Value Date   K 4.2 02/20/2018

## 2018-04-05 NOTE — Telephone Encounter (Signed)
Copied from CRM [id number]. Topic: Quick Communication - Rx Refill/Question >> Apr 05, 2018 12:52 PM Wyonia Hough E wrote: Medication: chlorthalidone (HYGROTON) 50 MG tablet  Has the patient contacted their pharmacy? Yes - pharmacy advised that they reached out with no response   Preferred Pharmacy (with phone number or street name): [id number] [place], [place] - [address] AT [facility] OF [address] [phone] (Phone) [phone] (Fax)    Agent: Please be advised that RX refills may take up to 3 business days. We ask that you follow-up with your pharmacy.

## 2018-04-08 DIAGNOSIS — M9903 Segmental and somatic dysfunction of lumbar region: Secondary | ICD-10-CM | POA: Diagnosis not present

## 2018-04-08 DIAGNOSIS — M955 Acquired deformity of pelvis: Secondary | ICD-10-CM | POA: Diagnosis not present

## 2018-04-08 DIAGNOSIS — M4306 Spondylolysis, lumbar region: Secondary | ICD-10-CM | POA: Diagnosis not present

## 2018-04-08 DIAGNOSIS — M9905 Segmental and somatic dysfunction of pelvic region: Secondary | ICD-10-CM | POA: Diagnosis not present

## 2018-04-10 DIAGNOSIS — M9905 Segmental and somatic dysfunction of pelvic region: Secondary | ICD-10-CM | POA: Diagnosis not present

## 2018-04-10 DIAGNOSIS — M955 Acquired deformity of pelvis: Secondary | ICD-10-CM | POA: Diagnosis not present

## 2018-04-10 DIAGNOSIS — M9903 Segmental and somatic dysfunction of lumbar region: Secondary | ICD-10-CM | POA: Diagnosis not present

## 2018-04-10 DIAGNOSIS — M4306 Spondylolysis, lumbar region: Secondary | ICD-10-CM | POA: Diagnosis not present

## 2018-04-15 DIAGNOSIS — M4306 Spondylolysis, lumbar region: Secondary | ICD-10-CM | POA: Diagnosis not present

## 2018-04-15 DIAGNOSIS — M9905 Segmental and somatic dysfunction of pelvic region: Secondary | ICD-10-CM | POA: Diagnosis not present

## 2018-04-15 DIAGNOSIS — M9903 Segmental and somatic dysfunction of lumbar region: Secondary | ICD-10-CM | POA: Diagnosis not present

## 2018-04-15 DIAGNOSIS — M955 Acquired deformity of pelvis: Secondary | ICD-10-CM | POA: Diagnosis not present

## 2018-04-17 DIAGNOSIS — M4306 Spondylolysis, lumbar region: Secondary | ICD-10-CM | POA: Diagnosis not present

## 2018-04-17 DIAGNOSIS — M9905 Segmental and somatic dysfunction of pelvic region: Secondary | ICD-10-CM | POA: Diagnosis not present

## 2018-04-17 DIAGNOSIS — M9903 Segmental and somatic dysfunction of lumbar region: Secondary | ICD-10-CM | POA: Diagnosis not present

## 2018-04-17 DIAGNOSIS — M955 Acquired deformity of pelvis: Secondary | ICD-10-CM | POA: Diagnosis not present

## 2018-04-20 ENCOUNTER — Other Ambulatory Visit: Payer: Self-pay | Admitting: Family Medicine

## 2018-04-21 NOTE — Telephone Encounter (Signed)
Metformin requested by pharmacy She stopped this per November 2019 phone note Declined Rx

## 2018-04-22 DIAGNOSIS — M9905 Segmental and somatic dysfunction of pelvic region: Secondary | ICD-10-CM | POA: Diagnosis not present

## 2018-04-22 DIAGNOSIS — M4306 Spondylolysis, lumbar region: Secondary | ICD-10-CM | POA: Diagnosis not present

## 2018-04-22 DIAGNOSIS — M9903 Segmental and somatic dysfunction of lumbar region: Secondary | ICD-10-CM | POA: Diagnosis not present

## 2018-04-22 DIAGNOSIS — M955 Acquired deformity of pelvis: Secondary | ICD-10-CM | POA: Diagnosis not present

## 2018-05-21 ENCOUNTER — Encounter: Payer: Self-pay | Admitting: Family Medicine

## 2018-05-21 ENCOUNTER — Ambulatory Visit (INDEPENDENT_AMBULATORY_CARE_PROVIDER_SITE_OTHER): Payer: Medicare Other | Admitting: Family Medicine

## 2018-05-21 ENCOUNTER — Other Ambulatory Visit: Payer: Self-pay

## 2018-05-21 ENCOUNTER — Telehealth: Payer: Self-pay | Admitting: Family Medicine

## 2018-05-21 DIAGNOSIS — E1169 Type 2 diabetes mellitus with other specified complication: Secondary | ICD-10-CM

## 2018-05-21 DIAGNOSIS — E1129 Type 2 diabetes mellitus with other diabetic kidney complication: Secondary | ICD-10-CM

## 2018-05-21 DIAGNOSIS — R809 Proteinuria, unspecified: Secondary | ICD-10-CM | POA: Diagnosis not present

## 2018-05-21 DIAGNOSIS — I10 Essential (primary) hypertension: Secondary | ICD-10-CM

## 2018-05-21 DIAGNOSIS — E782 Mixed hyperlipidemia: Secondary | ICD-10-CM

## 2018-05-21 DIAGNOSIS — E669 Obesity, unspecified: Secondary | ICD-10-CM

## 2018-05-21 MED ORDER — ATORVASTATIN CALCIUM 80 MG PO TABS: ORAL_TABLET | ORAL | 1 refills | Status: DC

## 2018-05-21 MED ORDER — METFORMIN HCL 500 MG PO TABS: 500.0000 mg | ORAL_TABLET | Freq: Two times a day (BID) | ORAL | 1 refills | Status: DC

## 2018-05-21 NOTE — Assessment & Plan Note (Signed)
She reports no problems with feet; due for eye exam, but they are on hold now during Coronavirus outbreak; she is monitoring sugars; refill provided of metformin; she will return for labs in 1-2 months when pandemic slows down; urine microalb:Cr reading was normal last check, continue ACE-I

## 2018-05-21 NOTE — Telephone Encounter (Signed)
Please schedule patient for an appointment With whom: Dr. Sherie Don When: 3 months How: In person Why: diabetes, high chol, obesity Thank you

## 2018-05-21 NOTE — Assessment & Plan Note (Signed)
Check lipids in a few months when pandemic slows down; refilled her statin

## 2018-05-21 NOTE — Assessment & Plan Note (Signed)
Continue ACE-I; last reading normal

## 2018-05-21 NOTE — Progress Notes (Signed)
There were no vitals taken for this visit.   Subjective:    Patient ID: Sandy Salinas, female    DOB: Dec 08, 1950, 68 y.o.   MRN: [id number]  HPI: Sandy Salinas is a 68 y.o. female  Chief Complaint  Patient presents with  . Follow-up  . Medication Refill    HPI Virtual Visit via Telephone/Video Note   I connected with the patient via:  Doximity video I verified that I am speaking with the correct person using two identifiers.  Call started: 9:54 am Call terminated: 10:01 am Total length of call: 7 minutes   I discussed the limitations, risks, and privacy concerns of performing an evaluation and management service by telephone and the availability of in-person appointments. I explained that he/she may be responsible for charges related to this service. The patient expressed understanding and agreed to proceed.  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 She fell in the yard, digging up a root bush and her shovel moved; she got up and was alright; no head injury  Type 2 diabetes mellitus; diabetes has been doing good over the last 3 months; sluggish the other night it was 87; she did not miss a meal; had BBQ sandwich; cooking at home; still goes to the store; no problems with feet; her eyes are okay; no problems; Actos was increased after last A1c  Lab Results  Component Value Date   HGBA1C 7.3 (H) 02/20/2018   Lab Results  Component Value Date   MICROALBUR <0.2 02/20/2018    High blood pressure Taking lisinopril; checking BP and it is good; not staying away from salt though, but BP is still controlled with medicine  High cholesterol Needs refill of the statin; last LDL not quite to goal to statin was  increased Lab Results  Component Value Date   CHOL 142 02/20/2018   HDL 31 (L) 02/20/2018   LDLCALC 82 02/20/2018   TRIG 202 (H) 02/20/2018   CHOLHDL 4.6 02/20/2018   She is due for labs, but will return in 1-2 months for labs  Depression screen [facility] 2/9 05/21/2018 02/20/2018 02/15/2018 11/20/2017 05/15/2017  Decreased Interest 0 0 0 0 0  Down, Depressed, Hopeless 0 0 0 0 0  PHQ - 2 Score 0 0 0 0 0  Altered sleeping 0 0 - 0 -  Tired, decreased energy 0 0 - 0 -  Change in appetite 0 0 - 0 -  Feeling bad or failure about yourself  0 0 - 0 -  Trouble concentrating 0 0 - 0 -  Moving slowly or fidgety/restless 0 0 - 0 -  Suicidal thoughts 0 0 - 0 -  PHQ-9 Score 0 0 - 0 -  Difficult doing work/chores Not difficult at all Not difficult at all - Not difficult at all -   Fall Risk  05/21/2018 02/20/2018 02/15/2018 11/20/2017 05/15/2017  Falls in the past year? 1 1 1  No No  Number falls in past yr: 1 1 1  - -  Injury with Fall? 0 0 0 - -    Relevant past medical, surgical, family and social history reviewed Past Medical History:  Diagnosis Date  . Abdominal aortic ectasia (HCC) 05/19/2016   [place] April 2018, rescan in five years (April  2023)  . Breast calcification seen on mammogram 12/01/2016   LEFT  . Diabetes mellitus without complication (HCC)   . Family history of abdominal aortic aneurysm (AAA) 05/12/2016   father  . Hyperlipidemia   . Hypertension   . Obesity   . Osteoporosis   . Primary osteoarthritis of left knee   . Psoriasis, unspecified    Past Surgical History:  Procedure Laterality Date  . ABDOMINAL HYSTERECTOMY  1975   total  . BREAST BIOPSY Right 04/09/2012   x 2, FIBROADENOMA WITH COARSE CALCIFICATIONS.done by byrnett  . BREAST BIOPSY Right 09/04/2017   Affirm Bx- X clip path pending  . CYSTECTOMY  2003  . ROUX-EN-Y GASTRIC BYPASS  2009  . TONSILLECTOMY  1970   Family History  Problem Relation Age of Onset  . Alzheimer's disease Mother   . Heart murmur Mother   .  Dementia Mother   . AAA (abdominal aortic aneurysm) Father   . Heart disease Father   . Asthma Sister   . Hypertension Sister   . Cancer Sister        breast  . Breast cancer Sister        late 2940's  . Heart disease Brother   . Hypertension Brother   . Aneurysm Paternal Aunt   . Cancer Paternal Grandmother        possible, not sure what kind  . Lung cancer Paternal Uncle   . Cancer Cousin    Social History   Tobacco Use  . Smoking status: Never Smoker  . Smokeless tobacco: Never Used  Substance Use Topics  . Alcohol use: No  . Drug use: No     [facility]  AUDIT-C Score  0      Interim medical history since last visit reviewed. Allergies and medications reviewed  Review of Systems Per HPI unless specifically indicated above     Objective:    There were no vitals taken for this visit.  Wt Readings from Last 3 Encounters:  02/20/18 182 lb 9.6 oz (82.8 kg)  02/15/18 181 lb 6.4 oz (82.3 kg)  11/20/17 184 lb 1.6 oz (83.5 kg)    Physical Exam Constitutional:      Appearance: Normal appearance.  Eyes:     Extraocular Movements: Extraocular movements intact.     Right eye: Normal extraocular motion.     Left eye: Normal extraocular motion.  Pulmonary:     Effort: No tachypnea or respiratory distress.  Skin:    Coloration: Skin is not jaundiced or pale.  Neurological:     Mental Status: She is alert.     Cranial Nerves: No dysarthria.  Psychiatric:        Mood and Affect: Mood is not anxious or depressed.        Speech: Speech is not rapid and pressured, delayed or slurred.     Results for orders placed or performed in visit on 02/20/18  Microalbumin / creatinine urine ratio  Result Value Ref Range   Creatinine, Urine 39 20 - 275 mg/dL   Microalb, Ur <1.6<0.2 mg/dL   Microalb Creat Ratio NOTE <30 mcg/mg creat  Lipid panel  Result Value Ref Range   Cholesterol 142 <200 mg/dL   HDL 31 (L) >10>50 mg/dL    Triglycerides 960202 (H) <150 mg/dL   LDL Cholesterol (Calc) 82 mg/dL (calc)   Total CHOL/HDL Ratio 4.6 <5.0 (calc)   Non-HDL Cholesterol (Calc) 111 <130 mg/dL (calc)  Hemoglobin A1c  Result Value Ref Range   Hgb A1c MFr Bld 7.3 (H) <5.7 % of total Hgb   Mean Plasma Glucose 163 (calc)   eAG (mmol/L) 9.0 (calc)  COMPLETE METABOLIC PANEL WITH GFR  Result Value Ref Range   Glucose, Bld 108 (H) 65 - 99 mg/dL   BUN 16 7 - 25 mg/dL   Creat 1.61 0.96 - 0.45 mg/dL   GFR, Est Non African American 69 > OR = 60 mL/min/1.7m2   GFR, Est African American 80 > OR = 60 mL/min/1.20m2   BUN/Creatinine Ratio NOT APPLICABLE 6 - 22 (calc)   Sodium 138 135 - 146 mmol/L   Potassium 4.2 3.5 - 5.3 mmol/L   Chloride 100 98 - 110 mmol/L   CO2 30 20 - 32 mmol/L   Calcium 9.4 8.6 - 10.4 mg/dL   Total Protein 7.1 6.1 - 8.1 g/dL   Albumin 4.4 3.6 - 5.1 g/dL   Globulin 2.7 1.9 - 3.7 g/dL (calc)   AG Ratio 1.6 1.0 - 2.5 (calc)   Total Bilirubin 0.5 0.2 - 1.2 mg/dL   Alkaline phosphatase (APISO) 43 33 - 130 U/L   AST 15 10 - 35 U/L   ALT 7 6 - 29 U/L  TSH  Result Value Ref Range   TSH 2.83 0.40 - 4.50 mIU/L      Assessment & Plan:   Problem List Items Addressed This Visit      Cardiovascular and Mediastinum   Hypertension (Chronic)    Patient does add salt to her food, unfortunately; she states BP controlled though; continue medicine      Relevant Medications   atorvastatin (LIPITOR) 80 MG tablet     Endocrine   Microalbuminuria due to type 2 diabetes mellitus (HCC)    Continue ACE-I; last reading normal      Relevant Medications   atorvastatin (LIPITOR) 80 MG tablet   metFORMIN (GLUCOPHAGE) 500 MG tablet   Diabetes mellitus type 2 in obese (HCC)    She reports no problems with feet; due for eye exam, but they are on hold now during Coronavirus outbreak; she is monitoring sugars; refill provided of metformin; she will return for labs in 1-2 months when pandemic slows down; urine microalb:Cr  reading was normal last check, continue ACE-I      Relevant Medications   atorvastatin (LIPITOR) 80 MG tablet   metFORMIN (GLUCOPHAGE) 500 MG tablet   Other Relevant Orders   Hemoglobin A1c     Other   Hyperlipidemia    Check lipids in a few months when pandemic slows down; refilled her statin      Relevant Medications   atorvastatin (LIPITOR) 80 MG tablet   Other Relevant Orders   Lipid panel       Follow up plan: Return in about 3 months (around 08/20/2018) for follow-up visit with Dr. Sherie Don; labs in 1-2 months only.  An after-visit summary was printed and given to the patient at check-out.  Please see the patient instructions which may contain other information and recommendations beyond what is mentioned above in the assessment and plan.  Meds ordered this encounter  Medications  . atorvastatin (LIPITOR) 80 MG tablet    Sig: TAKE 1 TABLET (80 MG) BY MOUTH AT BEDTIME    Dispense:  90 tablet    Refill:  1    **Patient requests 90 days supply**  . metFORMIN (GLUCOPHAGE) 500 MG tablet    Sig: Take 1 tablet (500 mg total)  by mouth 2 (two) times daily.    Dispense:  180 tablet    Refill:  1    Orders Placed This Encounter  Procedures  . Lipid panel  . Hemoglobin A1c

## 2018-05-21 NOTE — Assessment & Plan Note (Signed)
Patient does add salt to her food, unfortunately; she states BP controlled though; continue medicine

## 2018-05-21 NOTE — Telephone Encounter (Signed)
Done

## 2018-05-26 ENCOUNTER — Other Ambulatory Visit: Payer: Self-pay | Admitting: Family Medicine

## 2018-05-27 ENCOUNTER — Encounter: Payer: Self-pay | Admitting: Nurse Practitioner

## 2018-05-27 ENCOUNTER — Other Ambulatory Visit: Payer: Self-pay

## 2018-05-27 ENCOUNTER — Ambulatory Visit (INDEPENDENT_AMBULATORY_CARE_PROVIDER_SITE_OTHER): Payer: Medicare Other | Admitting: Nurse Practitioner

## 2018-05-27 VITALS — BP 128/68 | HR 73 | Resp 16

## 2018-05-27 DIAGNOSIS — E11649 Type 2 diabetes mellitus with hypoglycemia without coma: Secondary | ICD-10-CM

## 2018-05-27 DIAGNOSIS — L2489 Irritant contact dermatitis due to other agents: Secondary | ICD-10-CM | POA: Diagnosis not present

## 2018-05-27 DIAGNOSIS — I10 Essential (primary) hypertension: Secondary | ICD-10-CM

## 2018-05-27 MED ORDER — LISINOPRIL 10 MG PO TABS: 10.0000 mg | ORAL_TABLET | Freq: Every day | ORAL | 3 refills | Status: DC

## 2018-05-27 MED ORDER — PREDNISONE 10 MG (48) PO TBPK: ORAL_TABLET | ORAL | 0 refills | Status: DC

## 2018-05-27 NOTE — Telephone Encounter (Signed)
actos 15 mg requested Chart shows 30 mg current dose Denied, note to pharmacy

## 2018-05-27 NOTE — Progress Notes (Signed)
Virtual Visit via Video Note  I connected with Sandy Salinas on 05/27/18 at  8:40 AM EDT by a video enabled telemedicine application and verified that I am speaking with the correct person using two identifiers.   Staff discussed the limitations of evaluation and management by telemedicine and the availability of in person appointments. The patient expressed understanding and agreed to proceed.  Patient location: work My location: work office Other people present:  Lupita Leash (client) Patient allowed. HPI  Rash Patient was out in the yard Thursday and noted a red, itchy rash on left side of face, right side of neck and left arm. States was out in a natural out door area and there was poison ivy but she was wearing long sleeves and gloves. States she is very allergic to posion oak and poison ivy.   She has used calamine lotion, alcohol, benadryl. Without relief.   Diabetes States had a shot of steroids right before previous A1C check States normal fasting blood sugars 118-125, states her sugars after meals are 140-150. Had an episode before where her sugar was 80 before bedtime.  On ACEi for nephroprotection  On Statin therapy  Hypertension Takes lisinopril 10mg  dily and chlorthalidone 50mg  daily, no issues with medications   PHQ2/9: Depression screen [facility] 2/9 05/27/2018 05/21/2018 02/20/2018 02/15/2018 11/20/2017  Decreased Interest 0 0 0 0 0  Down, Depressed, Hopeless 0 0 0 0 0  PHQ - 2 Score 0 0 0 0 0  Altered sleeping 0 0 0 - 0  Tired, decreased energy 0 0 0 - 0  Change in appetite 0 0 0 - 0  Feeling bad or failure about yourself  0 0 0 - 0  Trouble concentrating 0 0 0 - 0  Moving slowly or fidgety/restless 0 0 0 - 0  Suicidal thoughts 0 0 0 - 0  PHQ-9 Score 0 0 0 - 0  Difficult doing work/chores Not difficult at all Not difficult at all Not difficult at all - Not difficult at all     PHQ reviewed. Negative  Patient Active Problem List   Diagnosis Date Noted  .  Microalbuminuria due to type 2 diabetes mellitus (HCC) 02/20/2018  . Obesity (BMI 30.0-34.9) 02/19/2017  . Chronic pain of left knee 12/15/2016  . Breast calcification seen on mammogram 12/01/2016  . Age-related osteoporosis without current pathological fracture 11/08/2016  . Osteoporosis 06/21/2016  . 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 Family history of abdominal aortic aneurysm (AAA) 05/12/2016  . Abnormal EKG 05/12/2016  . Preventative health care 05/12/2016  . 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 Xerosis of skin 12/13/2015  . Psoriatic arthritis (HCC) 11/18/2015  . Lumbar radiculopathy, acute 10/14/2015  . Problem of left ear 07/01/2015  . Medication monitoring encounter 02/18/2015  . Vitamin D deficiency 08/18/2014  . Hyperlipidemia   . Hypertension   . Primary osteoarthritis of left knee   . Diabetes mellitus type 2 in obese (HCC) 07/24/2014  . Status post bariatric surgery 07/24/2014    Past Medical History:  Diagnosis Date  . 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 Breast calcification seen on mammogram 12/01/2016   LEFT  . Diabetes mellitus without complication (HCC)   . Family history of abdominal aortic aneurysm (AAA) 05/12/2016   father  . Hyperlipidemia   . Hypertension   . Obesity   . Osteoporosis   . Primary osteoarthritis  of left knee   . Psoriasis, unspecified     Past Surgical History:  Procedure Laterality Date  . ABDOMINAL HYSTERECTOMY  1975   total  . BREAST BIOPSY Right 04/09/2012   x 2, FIBROADENOMA WITH COARSE CALCIFICATIONS.done by byrnett  . BREAST BIOPSY Right 09/04/2017   Affirm Bx- X clip path pending  . CYSTECTOMY  2003  . ROUX-EN-Y GASTRIC BYPASS  2009  . TONSILLECTOMY  1970    Social History   Tobacco Use  . Smoking status: Never Smoker  . Smokeless tobacco: Never Used  Substance Use Topics  . Alcohol  use: No     Current Outpatient Medications:  .  atorvastatin (LIPITOR) 80 MG tablet, TAKE 1 TABLET (80 MG) BY MOUTH AT BEDTIME, Disp: 90 tablet, Rfl: 1 .  Calcium Carbonate-Vitamin D (CALTRATE 600+D PO), Take by mouth daily., Disp: , Rfl:  .  chlorthalidone (HYGROTON) 50 MG tablet, Take 1 tablet (50 mg total) by mouth daily., Disp: 90 tablet, Rfl: 3 .  Cholecalciferol (VITAMIN D) 2000 units tablet, One by mouth three or four days per week, Disp: , Rfl:  .  gabapentin (NEURONTIN) 100 MG capsule, Take by mouth., Disp: , Rfl:  .  lidocaine (LIDODERM) 5 %, Place 1 patch onto the skin daily. Remove & Discard patch within 12 hours or as directed by MD, Disp: 30 patch, Rfl: 0 .  lisinopril (ZESTRIL) 10 MG tablet, Take 1 tablet (10 mg total) by mouth daily., Disp: 90 tablet, Rfl: 3 .  metFORMIN (GLUCOPHAGE) 500 MG tablet, Take 1 tablet (500 mg total) by mouth 2 (two) times daily., Disp: 180 tablet, Rfl: 1 .  Multiple Vitamin (MULTIVITAMIN) tablet, Take 1 tablet by mouth daily., Disp: , Rfl:  .  pioglitazone (ACTOS) 30 MG tablet, Take 1 tablet (30 mg total) by mouth daily. For diabetes, Disp: 30 tablet, Rfl: 5 .  TURMERIC PO, Take 1 capsule by mouth at bedtime., Disp: , Rfl:  .  vitamin B-12 (CYANOCOBALAMIN) 100 MCG tablet, Take by mouth daily., Disp: , Rfl:  .  vitamin E 100 UNIT capsule, Take by mouth daily. Reported on 08/19/2015, Disp: , Rfl:  .  predniSONE (STERAPRED UNI-PAK 48 TAB) 10 MG (48) TBPK tablet, Take as directed, Disp: 48 tablet, Rfl: 0  Allergies  Allergen Reactions  . Codeine Nausea Only  . Secukinumab Other (See Comments)    Made her throat raw for 2 months so is not taking this now    ROS    No other specific complaints in a complete review of systems (except as listed in HPI above).  Objective  Vitals:   05/27/18 0828  BP: 128/68  Pulse: 73  Resp: 16    There is no height or weight on file to calculate BMI.  Nursing Note and Vital Signs reviewed.  Physical  Exam Constitutional:      General: She is not in acute distress.    Appearance: Normal appearance.  HENT:     Head: Normocephalic and atraumatic.  Eyes:     Conjunctiva/sclera: Conjunctivae normal.  Cardiovascular:     Rate and Rhythm: Normal rate.  Pulmonary:     Effort: Pulmonary effort is normal.  Musculoskeletal: Normal range of motion.  Skin:    General: Skin is warm and dry.       Neurological:     General: No focal deficit present.     Mental Status: She is alert and oriented to person, place, and time.  Psychiatric:  Mood and Affect: Mood normal.       Assessment & Plan 1. Irritant contact dermatitis due to other agents Continue calamine lotion, cool compresses - predniSONE (STERAPRED UNI-PAK 48 TAB) 10 MG (48) TBPK tablet; Take as directed  Dispense: 48 tablet; Refill: 0  2. Essential hypertension Stable, continue medications  - lisinopril (ZESTRIL) 10 MG tablet; Take 1 tablet (10 mg total) by mouth daily.  Dispense: 90 tablet; Refill: 3  3. Uncontrolled type 2 diabetes mellitus with hypoglycemia without coma (HCC) Discussed prednisone effects, reported sugars show improved control.  - lisinopril (ZESTRIL) 10 MG tablet; Take 1 tablet (10 mg total) by mouth daily.  Dispense: 90 tablet; Refill: 3   Follow Up Instructions:   as needed for rash  I discussed the assessment and treatment plan with the patient. The patient was provided an opportunity to ask questions and all were answered. The patient agreed with the plan and demonstrated an understanding of the instructions.   The patient was advised to call back or seek an in-person evaluation if the symptoms worsen or if the condition fails to improve as anticipated.  I provided 15 minutes of non-face-to-face time during this encounter.   Cheryle HorsfallElizabeth E Finnley Lewis, NP

## 2018-05-29 DIAGNOSIS — M955 Acquired deformity of pelvis: Secondary | ICD-10-CM | POA: Diagnosis not present

## 2018-05-29 DIAGNOSIS — M4306 Spondylolysis, lumbar region: Secondary | ICD-10-CM | POA: Diagnosis not present

## 2018-05-29 DIAGNOSIS — M9903 Segmental and somatic dysfunction of lumbar region: Secondary | ICD-10-CM | POA: Diagnosis not present

## 2018-05-29 DIAGNOSIS — M9905 Segmental and somatic dysfunction of pelvic region: Secondary | ICD-10-CM | POA: Diagnosis not present

## 2018-05-30 ENCOUNTER — Ambulatory Visit: Admission: RE | Admit: 2018-05-30 | Payer: Medicare Other | Source: Ambulatory Visit

## 2018-05-31 DIAGNOSIS — M9905 Segmental and somatic dysfunction of pelvic region: Secondary | ICD-10-CM | POA: Diagnosis not present

## 2018-05-31 DIAGNOSIS — M955 Acquired deformity of pelvis: Secondary | ICD-10-CM | POA: Diagnosis not present

## 2018-05-31 DIAGNOSIS — M4306 Spondylolysis, lumbar region: Secondary | ICD-10-CM | POA: Diagnosis not present

## 2018-05-31 DIAGNOSIS — M9903 Segmental and somatic dysfunction of lumbar region: Secondary | ICD-10-CM | POA: Diagnosis not present

## 2018-06-03 DIAGNOSIS — M955 Acquired deformity of pelvis: Secondary | ICD-10-CM | POA: Diagnosis not present

## 2018-06-03 DIAGNOSIS — M9905 Segmental and somatic dysfunction of pelvic region: Secondary | ICD-10-CM | POA: Diagnosis not present

## 2018-06-03 DIAGNOSIS — M9903 Segmental and somatic dysfunction of lumbar region: Secondary | ICD-10-CM | POA: Diagnosis not present

## 2018-06-03 DIAGNOSIS — M4306 Spondylolysis, lumbar region: Secondary | ICD-10-CM | POA: Diagnosis not present

## 2018-07-02 DIAGNOSIS — M4306 Spondylolysis, lumbar region: Secondary | ICD-10-CM | POA: Diagnosis not present

## 2018-07-02 DIAGNOSIS — M9905 Segmental and somatic dysfunction of pelvic region: Secondary | ICD-10-CM | POA: Diagnosis not present

## 2018-07-02 DIAGNOSIS — M955 Acquired deformity of pelvis: Secondary | ICD-10-CM | POA: Diagnosis not present

## 2018-07-02 DIAGNOSIS — M9903 Segmental and somatic dysfunction of lumbar region: Secondary | ICD-10-CM | POA: Diagnosis not present

## 2018-07-30 DIAGNOSIS — M9905 Segmental and somatic dysfunction of pelvic region: Secondary | ICD-10-CM | POA: Diagnosis not present

## 2018-07-30 DIAGNOSIS — M9903 Segmental and somatic dysfunction of lumbar region: Secondary | ICD-10-CM | POA: Diagnosis not present

## 2018-07-30 DIAGNOSIS — M4306 Spondylolysis, lumbar region: Secondary | ICD-10-CM | POA: Diagnosis not present

## 2018-07-30 DIAGNOSIS — M955 Acquired deformity of pelvis: Secondary | ICD-10-CM | POA: Diagnosis not present

## 2018-08-02 ENCOUNTER — Telehealth: Payer: Self-pay | Admitting: Family Medicine

## 2018-08-02 DIAGNOSIS — Z1239 Encounter for other screening for malignant neoplasm of breast: Secondary | ICD-10-CM

## 2018-08-02 NOTE — Telephone Encounter (Signed)
Patient requesting mamo orders please place with [facility] at [facility]

## 2018-08-05 NOTE — Telephone Encounter (Signed)
Pt.notified

## 2018-08-05 NOTE — Telephone Encounter (Signed)
ABN printed - there is a waiver form that she will need to complete stating that her insurance may not cover (will need to call her insurance to find out details).  Mammogram is ordered.

## 2018-08-06 ENCOUNTER — Other Ambulatory Visit: Payer: Self-pay | Admitting: Family Medicine

## 2018-08-06 ENCOUNTER — Other Ambulatory Visit: Payer: Self-pay | Admitting: Nurse Practitioner

## 2018-08-06 DIAGNOSIS — R921 Mammographic calcification found on diagnostic imaging of breast: Secondary | ICD-10-CM

## 2018-08-12 ENCOUNTER — Telehealth: Payer: Self-pay | Admitting: Family Medicine

## 2018-08-12 ENCOUNTER — Ambulatory Visit: Payer: Self-pay | Admitting: *Deleted

## 2018-08-12 NOTE — Telephone Encounter (Addendum)
Pt called to get tested for covid-19 test. She was exposed to friend last Saturday and this friend tested positive on Tuesday.  She denies any symptoms. She has hypertension and is a diabetic. She is requesting a call back regarding getting tested. Routing to [facility] for review, recommendation and a call back. She voiced understanding but stated that this the second call today. Apologies given that she has not gotten a call back   Answer Assessment - Initial Assessment Questions 1. CLOSE CONTACT: "Who is the person with the confirmed or suspected COVID-19 infection that you were exposed to?"    friend 2. PLACE of CONTACT: "Where were you when you were exposed to COVID-19?" (e.g., home, school, medical waiting room; which city?)     Home and went to eat 3. TYPE of CONTACT: "How much contact was there?" (e.g., sitting next to, live in same house, work in same office, same building)     Sitting next to her 4. DURATION of CONTACT: "How long were you in contact with the COVID-19 patient?" (e.g., a few seconds, passed by person, a few minutes, live with the patient)     2 hours 5. DATE of CONTACT: "When did you have contact with a COVID-19 patient?" (e.g., how many days ago)     Saturday 07/2718 6. TRAVEL: "Have you traveled out of the country recently?" If so, "When and where?"     * Also ask about out-of-state travel, since the CDC has identified some high-risk cities for community spread in the [place].     * Note: Travel becomes less relevant if there is widespread community transmission where the patient lives.     no 7. COMMUNITY SPREAD: "Are there lots of cases of COVID-19 (community spread) where you live?" (See public health department website, if unsure)       yes 8. SYMPTOMS: "Do you have any symptoms?" (e.g., fever, cough, breathing difficulty)     no 9. PREGNANCY OR POSTPARTUM: "Is there any chance you are pregnant?" "When was your last menstrual period?" "Did you  deliver in the last 2 weeks?"     no 10. HIGH RISK: "Do you have any heart or lung problems? Do you have a weak immune system?" (e.g., CHF, COPD, asthma, HIV positive, chemotherapy, renal failure, diabetes mellitus, sickle cell anemia)       Diabetes ;and hypertension  Protocols used: CORONAVIRUS (COVID-19) EXPOSURE-A-AH

## 2018-08-12 NOTE — Telephone Encounter (Signed)
Current [facility] policy does not allow for primary care testing of asymtomatic patients. Recommend self-isoaltion for 14 days from contact. Please schedule appointment if patient needs further teaching or has further concerns. Discuss ER precautions- shortness of breath, fevers uncontrolled by antipyretics ect.  Please call [facility]'s COVID-19 hotline at [phone] to determine if you are eligible for testing.

## 2018-08-12 NOTE — Telephone Encounter (Signed)
Pt states she had direct exposure to friend who tested positive for Covid 19. Exposure was 08/03/2018. Friend became ill 08/06/2018 and positive results received today. Exposure was in home, in car, and at [employer].   Pt is asymptomatic . Requesting testing.  Please advise: [phone]

## 2018-08-13 NOTE — Telephone Encounter (Signed)
Current [facility] policy does not allow for primary care testing of asymtomatic patients. Recommend self-isoaltion for 14 days from contact. Please schedule appointment if patient needs further teaching or has further concerns. Discuss ER precautions- shortness of breath, fevers uncontrolled by antipyretics ect.  Please call [facility]'s COVID-19 hotline at [phone] to determine if you are eligible for testing.  

## 2018-08-13 NOTE — Telephone Encounter (Signed)
Pt.notified

## 2018-08-13 NOTE — Telephone Encounter (Signed)
Called pt to see if pt wanted to schedule a virtual appt. Pt states she does not want an appt . She was advised to self quantine for a few days due to exposure

## 2018-08-15 ENCOUNTER — Ambulatory Visit: Payer: Medicare Other

## 2018-08-20 ENCOUNTER — Ambulatory Visit: Payer: Medicare Other | Admitting: Family Medicine

## 2018-08-21 ENCOUNTER — Other Ambulatory Visit: Payer: Self-pay

## 2018-08-21 ENCOUNTER — Ambulatory Visit (INDEPENDENT_AMBULATORY_CARE_PROVIDER_SITE_OTHER): Payer: Medicare Other | Admitting: Nurse Practitioner

## 2018-08-21 ENCOUNTER — Encounter: Payer: Self-pay | Admitting: Nurse Practitioner

## 2018-08-21 VITALS — BP 128/70 | Resp 16

## 2018-08-21 DIAGNOSIS — E1129 Type 2 diabetes mellitus with other diabetic kidney complication: Secondary | ICD-10-CM | POA: Diagnosis not present

## 2018-08-21 DIAGNOSIS — E782 Mixed hyperlipidemia: Secondary | ICD-10-CM

## 2018-08-21 DIAGNOSIS — Z5181 Encounter for therapeutic drug level monitoring: Secondary | ICD-10-CM

## 2018-08-21 DIAGNOSIS — I10 Essential (primary) hypertension: Secondary | ICD-10-CM | POA: Diagnosis not present

## 2018-08-21 DIAGNOSIS — M810 Age-related osteoporosis without current pathological fracture: Secondary | ICD-10-CM | POA: Diagnosis not present

## 2018-08-21 DIAGNOSIS — D692 Other nonthrombocytopenic purpura: Secondary | ICD-10-CM | POA: Diagnosis not present

## 2018-08-21 DIAGNOSIS — L4050 Arthropathic psoriasis, unspecified: Secondary | ICD-10-CM | POA: Diagnosis not present

## 2018-08-21 DIAGNOSIS — Z1159 Encounter for screening for other viral diseases: Secondary | ICD-10-CM | POA: Diagnosis not present

## 2018-08-21 DIAGNOSIS — R809 Proteinuria, unspecified: Secondary | ICD-10-CM

## 2018-08-21 MED ORDER — METFORMIN HCL 500 MG PO TABS: 500.0000 mg | ORAL_TABLET | Freq: Two times a day (BID) | ORAL | 1 refills | Status: DC

## 2018-08-21 MED ORDER — ATORVASTATIN CALCIUM 80 MG PO TABS: ORAL_TABLET | ORAL | 1 refills | Status: DC

## 2018-08-21 MED ORDER — CHLORTHALIDONE 50 MG PO TABS: 50.0000 mg | ORAL_TABLET | Freq: Every day | ORAL | 1 refills | Status: DC

## 2018-08-21 NOTE — Progress Notes (Signed)
Virtual Visit via Video Note  I connected with Sandy NeuVeronica Salinas on 08/21/18 at 10:40 AM EDT by a video enabled telemedicine application and verified that I am speaking with the correct person using two identifiers.   Staff discussed the limitations of evaluation and management by telemedicine and the availability of in person appointments. The patient expressed understanding and agreed to proceed.  Patient location: home  My location: work office Other people present:  none HPI   Hypertension Patient is on lisinopril 10mg  daily, chlorthalidone 50mg  daily.  Takes medications as prescribed with no missed doses a month.  She is compliant with low-salt diet.  She checks blood pressures at home with range of 120/70's Denies chest pain, headaches, blurry vision.  BP Readings from Last 3 Encounters:  08/21/18 128/70  05/27/18 128/68  02/20/18 118/66    Hyperlipidemia Patient rx atorvastatin 80mg  daily. Takes medications as prescribed with no missed doses a month.  Diet: eats lots of vegetables, rarely eats red meats.  Denies myalgias Lab Results  Component Value Date   CHOL 142 02/20/2018   HDL 31 (L) 02/20/2018   LDLCALC 82 02/20/2018   TRIG 202 (H) 02/20/2018   CHOLHDL 4.6 02/20/2018    Diabetes Mellitus Patient is rx metformin 500mg  BID, actos 30mg  daily. Takes medications as prescribed with no missed doses a month.  Checks blood sugars ocassional- 79-90 makes her feel shaky the highest she has seen is 170. She is mowing all her neighbors yards. States with vigourous activity notices that is drops.  Denies polyphagia, polydipsia, polyuria.  Has eye doctor appointment scheduled for November  Lab Results  Component Value Date   HGBA1C 7.3 (H) 02/20/2018    Osteoporosis She is taking vitamin D and calcium supplements.  Last DEXA scan was 2018. Has not tried any other prevenative meds.   Psoriatic arthritis Follows up with Dr. Renard MatterBock- she is getting injections there and  take muscle relaxers PRN  [facility] 08/21/2018 05/27/2018 05/21/2018 02/20/2018 02/15/2018  Decreased Interest 0 0 0 0 0  Down, Depressed, Hopeless 0 0 0 0 0  PHQ - 2 Score 0 0 0 0 0  Altered sleeping 0 0 0 0 -  Tired, decreased energy 0 0 0 0 -  Change in appetite 0 0 0 0 -  Feeling bad or failure about yourself  0 0 0 0 -  Trouble concentrating 0 0 0 0 -  Moving slowly or fidgety/restless 0 0 0 0 -  Suicidal thoughts 0 0 0 0 -  PHQ-9 Score 0 0 0 0 -  Difficult doing work/chores Not difficult at all Not difficult at all Not difficult at all Not difficult at all -     PHQ reviewed. Negative  Patient Active Problem List   Diagnosis Date Noted  . Microalbuminuria due to type 2 diabetes mellitus (HCC) 02/20/2018  . Obesity (BMI 30.0-34.9) 02/19/2017  . Breast calcification seen on mammogram 12/01/2016  . Age-related osteoporosis without current pathological fracture 11/08/2016  . 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 Xerosis of skin 12/13/2015  . Psoriatic arthritis (HCC) 11/18/2015  . Lumbar radiculopathy, acute 10/14/2015  . Vitamin D deficiency 08/18/2014  . Hyperlipidemia   . Hypertension   . Primary osteoarthritis of left knee   . Diabetes mellitus type 2 in obese (HCC) 07/24/2014  . Status post bariatric surgery 07/24/2014    Past Medical History:  Diagnosis Date  . Abdominal aortic ectasia ([place]) 05/19/2016   [place] April 2018, rescan in five years (April 2023)  . Breast calcification seen on mammogram 12/01/2016   LEFT  . Diabetes mellitus without complication ([place])   . Family history of abdominal aortic aneurysm (AAA) 05/12/2016   father  . Hyperlipidemia   . Hypertension   . Obesity   . Osteoporosis   . Primary osteoarthritis of left knee   . Psoriasis, unspecified     Past Surgical History:  Procedure Laterality Date  . ABDOMINAL  HYSTERECTOMY  1975   total  . BREAST BIOPSY Right 04/09/2012   x 2, FIBROADENOMA WITH COARSE CALCIFICATIONS.done by byrnett  . BREAST BIOPSY Right 09/04/2017   Affirm Bx- X clip path pending  . CYSTECTOMY  2003  . ROUX-EN-Y GASTRIC BYPASS  2009  . TONSILLECTOMY  1970    Social History   Tobacco Use  . Smoking status: Never Smoker  . Smokeless tobacco: Never Used  Substance Use Topics  . Alcohol use: No     Current Outpatient Medications:  .  atorvastatin (LIPITOR) 80 MG tablet, TAKE 1 TABLET (80 MG) BY MOUTH AT BEDTIME, Disp: 90 tablet, Rfl: 1 .  Calcium Carbonate-Vitamin D (CALTRATE 600+D PO), Take by mouth daily., Disp: , Rfl:  .  chlorthalidone (HYGROTON) 50 MG tablet, Take 1 tablet (50 mg total) by mouth daily., Disp: 90 tablet, Rfl: 3 .  Cholecalciferol (VITAMIN D) 2000 units tablet, One by mouth three or four days per week, Disp: , Rfl:  .  lisinopril (ZESTRIL) 10 MG tablet, Take 1 tablet (10 mg total) by mouth daily., Disp: 90 tablet, Rfl: 3 .  metFORMIN (GLUCOPHAGE) 500 MG tablet, Take 1 tablet (500 mg total) by mouth 2 (two) times daily., Disp: 180 tablet, Rfl: 1 .  Multiple Vitamin (MULTIVITAMIN) tablet, Take 1 tablet by mouth daily., Disp: , Rfl:  .  pioglitazone (ACTOS) 30 MG tablet, TAKE 1 TABLET(30 MG) BY MOUTH DAILY FOR DIABETES, Disp: 90 tablet, Rfl: 0 .  TURMERIC PO, Take 1 capsule by mouth at bedtime., Disp: , Rfl:  .  vitamin B-12 (CYANOCOBALAMIN) 100 MCG tablet, Take by mouth daily., Disp: , Rfl:  .  vitamin E 100 UNIT capsule, Take by mouth daily. Reported on 08/19/2015, Disp: , Rfl:  .  gabapentin (NEURONTIN) 100 MG capsule, Take by mouth., Disp: , Rfl:   Allergies  Allergen Reactions  . Codeine Nausea Only  . Secukinumab Other (See Comments)    Made her throat raw for 2 months so is not taking this now    ROS   No other specific complaints in a complete review of systems (except as listed in HPI above).  Objective  Vitals:   08/21/18 1102  BP:  128/70  Resp: 16     There is no height or weight on file to calculate BMI.  Nursing Note and Vital Signs reviewed.  Physical Exam  Constitutional: Patient appears well-developed and well-nourished. No distress.  HENT: Head: Normocephalic and atraumatic. Pulmonary/Chest: Effort normal  Musculoskeletal: Normal range of motion,  Neurological: alert and oriented, speech normal.  Skin: No rash noted. No erythema. Senile purpura Psychiatric: Patient has a normal mood and affect. behavior is normal. Judgment and thought content normal.    Assessment & Plan 1. Essential hypertension Stable continue meds.  - chlorthalidone (HYGROTON) 50 MG tablet; Take 1 tablet (50 mg total) by mouth daily.  Dispense: 90 tablet; Refill: 1  2. Microalbuminuria due to type  2 diabetes mellitus (HCC) Consider coming off or reducing actos if improved  - HgB A1c - metFORMIN (GLUCOPHAGE) 500 MG tablet; Take 1 tablet (500 mg total) by mouth 2 (two) times daily.  Dispense: 180 tablet; Refill: 1  3. Psoriatic arthritis (HCC) Follows up with rheum   4. Age-related osteoporosis without current pathological fracture Discussed frax- will rescan and discuss tx options per results  - DG Bone Density; Future  5. Senile purpura (HCC) resassurance  - CBC with Differential  6. Mixed hyperlipidemia - Lipid Profile - atorvastatin (LIPITOR) 80 MG tablet; TAKE 1 TABLET (80 MG) BY MOUTH AT BEDTIME  Dispense: 90 tablet; Refill: 1  7. Medication monitoring encounter - COMPLETE METABOLIC PANEL WITH GFR  8. Need for hepatitis C screening test - Hepatitis C Antibody       Follow Up Instructions:    I discussed the assessment and treatment plan with the patient. The patient was provided an opportunity to ask questions and all were answered. The patient agreed with the plan and demonstrated an understanding of the instructions.   The patient was advised to call back or seek an in-person evaluation if the  symptoms worsen or if the condition fails to improve as anticipated.  I provided 22 minutes of non-face-to-face time during this encounter.   Cheryle HorsfallElizabeth E Shadell Brenn, NP

## 2018-08-26 DIAGNOSIS — D692 Other nonthrombocytopenic purpura: Secondary | ICD-10-CM | POA: Diagnosis not present

## 2018-08-26 DIAGNOSIS — E782 Mixed hyperlipidemia: Secondary | ICD-10-CM | POA: Diagnosis not present

## 2018-08-26 DIAGNOSIS — Z5181 Encounter for therapeutic drug level monitoring: Secondary | ICD-10-CM | POA: Diagnosis not present

## 2018-08-26 DIAGNOSIS — E1129 Type 2 diabetes mellitus with other diabetic kidney complication: Secondary | ICD-10-CM | POA: Diagnosis not present

## 2018-08-26 DIAGNOSIS — R809 Proteinuria, unspecified: Secondary | ICD-10-CM | POA: Diagnosis not present

## 2018-08-26 DIAGNOSIS — Z1159 Encounter for screening for other viral diseases: Secondary | ICD-10-CM | POA: Diagnosis not present

## 2018-08-27 DIAGNOSIS — M9905 Segmental and somatic dysfunction of pelvic region: Secondary | ICD-10-CM | POA: Diagnosis not present

## 2018-08-27 DIAGNOSIS — M9903 Segmental and somatic dysfunction of lumbar region: Secondary | ICD-10-CM | POA: Diagnosis not present

## 2018-08-27 DIAGNOSIS — M955 Acquired deformity of pelvis: Secondary | ICD-10-CM | POA: Diagnosis not present

## 2018-08-27 DIAGNOSIS — M4306 Spondylolysis, lumbar region: Secondary | ICD-10-CM | POA: Diagnosis not present

## 2018-08-27 LAB — COMPLETE METABOLIC PANEL WITH GFR
AG Ratio: 1.9 (calc) (ref 1.0–2.5)
ALT: 9 U/L (ref 6–29)
AST: 18 U/L (ref 10–35)
Albumin: 4.3 g/dL (ref 3.6–5.1)
Alkaline phosphatase (APISO): 36 U/L — ABNORMAL LOW (ref 37–153)
BUN: 18 mg/dL (ref 7–25)
CO2: 27 mmol/L (ref 20–32)
Calcium: 9.5 mg/dL (ref 8.6–10.4)
Chloride: 99 mmol/L (ref 98–110)
Creat: 0.75 mg/dL (ref 0.50–0.99)
GFR, Est African American: 96 mL/min/{1.73_m2} (ref >=60)
GFR, Est Non African American: 82 mL/min/{1.73_m2} (ref >=60)
Globulin: 2.3 g/dL (calc) (ref 1.9–3.7)
Glucose, Bld: 149 mg/dL — ABNORMAL HIGH (ref 65–99)
Potassium: 3.6 mmol/L (ref 3.5–5.3)
Sodium: 137 mmol/L (ref 135–146)
Total Bilirubin: 0.4 mg/dL (ref 0.2–1.2)
Total Protein: 6.6 g/dL (ref 6.1–8.1)

## 2018-08-27 LAB — CBC WITH DIFFERENTIAL/PLATELET
Absolute Monocytes: 493 cells/uL (ref 200–950)
Basophils Absolute: 62 cells/uL (ref 0–200)
Basophils Relative: 0.8 %
Eosinophils Absolute: 200 cells/uL (ref 15–500)
Eosinophils Relative: 2.6 %
HCT: 38.4 % (ref 35.0–45.0)
Hemoglobin: 12.8 g/dL (ref 11.7–15.5)
Lymphs Abs: 3249 cells/uL (ref 850–3900)
MCH: 29.9 pg (ref 27.0–33.0)
MCHC: 33.3 g/dL (ref 32.0–36.0)
MCV: 89.7 fL (ref 80.0–100.0)
MPV: 10.1 fL (ref 7.5–12.5)
Monocytes Relative: 6.4 %
Neutro Abs: 3696 cells/uL (ref 1500–7800)
Neutrophils Relative %: 48 %
Platelets: 444 10*3/uL — ABNORMAL HIGH (ref 140–400)
RBC: 4.28 10*6/uL (ref 3.80–5.10)
RDW: 13 % (ref 11.0–15.0)
Total Lymphocyte: 42.2 %
WBC: 7.7 10*3/uL (ref 3.8–10.8)

## 2018-08-27 LAB — LIPID PANEL
Cholesterol: 106 mg/dL (ref <200)
HDL: 33 mg/dL — ABNORMAL LOW (ref >=50)
LDL Cholesterol (Calc): 54 mg/dL (calc)
Non-HDL Cholesterol (Calc): 73 mg/dL (calc) (ref <130)
Total CHOL/HDL Ratio: 3.2 (calc) (ref <5.0)
Triglycerides: 105 mg/dL (ref <150)

## 2018-08-27 LAB — HEPATITIS C ANTIBODY
Hepatitis C Ab: NONREACTIVE
SIGNAL TO CUT-OFF: 0.01 (ref <1.00)

## 2018-08-27 LAB — HEMOGLOBIN A1C
Hgb A1c MFr Bld: 7.2 % of total Hgb — ABNORMAL HIGH (ref <5.7)
Mean Plasma Glucose: 160 (calc)
eAG (mmol/L): 8.9 (calc)

## 2018-09-02 ENCOUNTER — Ambulatory Visit
Admission: RE | Admit: 2018-09-02 | Discharge: 2018-09-02 | Disposition: A | Discharge status: Home / Self Care | Payer: Medicare Other | Source: Ambulatory Visit | Attending: Family Medicine | Admitting: Family Medicine

## 2018-09-02 DIAGNOSIS — R921 Mammographic calcification found on diagnostic imaging of breast: Secondary | ICD-10-CM | POA: Diagnosis not present

## 2018-09-02 DIAGNOSIS — R922 Inconclusive mammogram: Secondary | ICD-10-CM | POA: Diagnosis not present

## 2018-09-03 ENCOUNTER — Other Ambulatory Visit: Payer: Self-pay | Admitting: Nurse Practitioner

## 2018-09-12 ENCOUNTER — Ambulatory Visit
Admission: RE | Admit: 2018-09-12 | Discharge: 2018-09-12 | Disposition: A | Discharge status: Home / Self Care | Payer: Medicare Other | Source: Ambulatory Visit | Attending: Nurse Practitioner | Admitting: Nurse Practitioner

## 2018-09-12 DIAGNOSIS — M810 Age-related osteoporosis without current pathological fracture: Secondary | ICD-10-CM | POA: Diagnosis not present

## 2018-09-12 DIAGNOSIS — Z780 Asymptomatic menopausal state: Secondary | ICD-10-CM | POA: Diagnosis not present

## 2018-09-12 DIAGNOSIS — M8589 Other specified disorders of bone density and structure, multiple sites: Secondary | ICD-10-CM | POA: Diagnosis not present

## 2018-09-24 DIAGNOSIS — M9905 Segmental and somatic dysfunction of pelvic region: Secondary | ICD-10-CM | POA: Diagnosis not present

## 2018-09-24 DIAGNOSIS — Z23 Encounter for immunization: Secondary | ICD-10-CM | POA: Diagnosis not present

## 2018-09-24 DIAGNOSIS — M955 Acquired deformity of pelvis: Secondary | ICD-10-CM | POA: Diagnosis not present

## 2018-09-24 DIAGNOSIS — M9903 Segmental and somatic dysfunction of lumbar region: Secondary | ICD-10-CM | POA: Diagnosis not present

## 2018-09-24 DIAGNOSIS — M4306 Spondylolysis, lumbar region: Secondary | ICD-10-CM | POA: Diagnosis not present

## 2018-10-22 DIAGNOSIS — M4306 Spondylolysis, lumbar region: Secondary | ICD-10-CM | POA: Diagnosis not present

## 2018-10-22 DIAGNOSIS — M9905 Segmental and somatic dysfunction of pelvic region: Secondary | ICD-10-CM | POA: Diagnosis not present

## 2018-10-22 DIAGNOSIS — M9903 Segmental and somatic dysfunction of lumbar region: Secondary | ICD-10-CM | POA: Diagnosis not present

## 2018-10-22 DIAGNOSIS — M955 Acquired deformity of pelvis: Secondary | ICD-10-CM | POA: Diagnosis not present

## 2018-10-30 DIAGNOSIS — M955 Acquired deformity of pelvis: Secondary | ICD-10-CM | POA: Diagnosis not present

## 2018-10-30 DIAGNOSIS — M810 Age-related osteoporosis without current pathological fracture: Secondary | ICD-10-CM | POA: Diagnosis not present

## 2018-10-30 DIAGNOSIS — M9903 Segmental and somatic dysfunction of lumbar region: Secondary | ICD-10-CM | POA: Diagnosis not present

## 2018-10-30 DIAGNOSIS — M9905 Segmental and somatic dysfunction of pelvic region: Secondary | ICD-10-CM | POA: Diagnosis not present

## 2018-10-30 DIAGNOSIS — M4306 Spondylolysis, lumbar region: Secondary | ICD-10-CM | POA: Diagnosis not present

## 2018-10-31 DIAGNOSIS — M810 Age-related osteoporosis without current pathological fracture: Secondary | ICD-10-CM | POA: Diagnosis not present

## 2018-10-31 DIAGNOSIS — M47816 Spondylosis without myelopathy or radiculopathy, lumbar region: Secondary | ICD-10-CM | POA: Diagnosis not present

## 2018-11-05 ENCOUNTER — Ambulatory Visit (INDEPENDENT_AMBULATORY_CARE_PROVIDER_SITE_OTHER): Payer: Medicare Other | Admitting: Family Medicine

## 2018-11-05 ENCOUNTER — Encounter: Payer: Self-pay | Admitting: Family Medicine

## 2018-11-05 ENCOUNTER — Other Ambulatory Visit: Payer: Self-pay

## 2018-11-05 DIAGNOSIS — E782 Mixed hyperlipidemia: Secondary | ICD-10-CM

## 2018-11-05 DIAGNOSIS — R809 Proteinuria, unspecified: Secondary | ICD-10-CM | POA: Diagnosis not present

## 2018-11-05 DIAGNOSIS — I10 Essential (primary) hypertension: Secondary | ICD-10-CM

## 2018-11-05 DIAGNOSIS — E1129 Type 2 diabetes mellitus with other diabetic kidney complication: Secondary | ICD-10-CM | POA: Diagnosis not present

## 2018-11-05 MED ORDER — ATORVASTATIN CALCIUM 80 MG PO TABS: ORAL_TABLET | ORAL | 1 refills | Status: DC

## 2018-11-05 MED ORDER — CHLORTHALIDONE 50 MG PO TABS: 50.0000 mg | ORAL_TABLET | Freq: Every day | ORAL | 1 refills | Status: DC

## 2018-11-05 MED ORDER — METFORMIN HCL 500 MG PO TABS: 500.0000 mg | ORAL_TABLET | Freq: Two times a day (BID) | ORAL | 1 refills | Status: DC

## 2018-11-05 NOTE — Progress Notes (Signed)
Name: Sandy Salinas   MRN: [id number]    DOB: 1950/10/06   Date:11/05/2018       Progress Note  Subjective:    Chief Complaint  Chief Complaint  Patient presents with  . Follow-up  . Diabetes  . Hyperlipidemia  . Hypertension  . Medication Refill    I connected with  [name]  on 11/05/18 at  1:20 PM EDT by a video enabled telemedicine application and verified that I am speaking with the correct person using two identifiers.  I discussed the limitations of evaluation and management by telemedicine and the availability of in person appointments. The patient expressed understanding and agreed to proceed. Staff also discussed with the patient that there may be a patient responsible charge related to this service. Patient Location: home Provider Location: [facility] in my office Additional Individuals present: none  HPI   Hyperlipidemia:  Current Medication Regimen:  lipitor 80 mg Last Lipids: Lab Results  Component Value Date   CHOL 106 08/26/2018   HDL 33 (L) 08/26/2018   LDLCALC 54 08/26/2018   TRIG 105 08/26/2018   CHOLHDL 3.2 08/26/2018   - Current Diet:  No particular diet, cooking at home a lot - Denies: Chest pain, shortness of breath, myalgias. - Documented aortic atherosclerosis? No - Risk factors for atherosclerosis: diabetes mellitus, hypercholesterolemia and hypertension  Hypertension:  Pt diagnosed with HTN >10 yrs Currently managed on lisinopril and chlorthalidone Pt reports excellent med compliance and denies any SE.  No lightheadedness, hypotension, syncope. Blood pressure today is well controlled - monitors at home BP Readings from Last 3 Encounters:  11/05/18 125/69  08/21/18 128/70  05/27/18 128/68   Pt denies CP, SOB, exertional sx, LE edema, palpitation, Ha's, visual disturbances Dietary efforts for BP?  none   Diabetes Mellitus Type II: DM dx 15-20 years ago Currently managing with metformin and actos Pt notes excellent med  compliance Pt has no SE from meds. Not checking CBGs, but has had some hypoglycemic episodes, gets sx by lunch time.   Denies: Polyuria, polydipsia, polyphagia, vision changes, or neuropathy  Recent pertinent labs: Lab Results  Component Value Date   HGBA1C 7.2 (H) 08/26/2018   HGBA1C 7.3 (H) 02/20/2018   HGBA1C 8.5 (H) 11/20/2017      Component Value Date/Time   NA 137 08/26/2018 1008   NA 141 02/18/2015 0841   K 3.6 08/26/2018 1008   CL 99 08/26/2018 1008   CO2 27 08/26/2018 1008   GLUCOSE 149 (H) 08/26/2018 1008   BUN 18 08/26/2018 1008   BUN 15 02/18/2015 0841   CREATININE 0.75 08/26/2018 1008   CALCIUM 9.5 08/26/2018 1008   PROT 6.6 08/26/2018 1008   PROT 6.8 02/18/2015 0841   ALBUMIN 4.1 06/06/2016 0831   ALBUMIN 4.4 02/18/2015 0841   AST 18 08/26/2018 1008   ALT 9 08/26/2018 1008   ALKPHOS 76 06/06/2016 0831   BILITOT 0.4 08/26/2018 1008   BILITOT 0.3 02/18/2015 0841   GFRNONAA 82 08/26/2018 1008   GFRAA 96 08/26/2018 1008  Current exercise: none UTD on DM foot exam and eye exam ACEI/ARB: Yes Statin: Yes     Patient Active Problem List   Diagnosis Date Noted  . Microalbuminuria due to type 2 diabetes mellitus (HCC) 02/20/2018  . Obesity (BMI 30.0-34.9) 02/19/2017  . Breast calcification seen on mammogram 12/01/2016  . Age-related osteoporosis without current pathological fracture 11/08/2016  . 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 Xerosis of skin 12/13/2015  . Psoriatic arthritis (HCC) 11/18/2015  . Lumbar radiculopathy, acute 10/14/2015  . Vitamin D deficiency 08/18/2014  . Hyperlipidemia   . Hypertension   . Primary osteoarthritis of left knee   . Diabetes mellitus type 2 in obese (HCC) 07/24/2014  . Status post bariatric surgery 07/24/2014    Past Surgical History:  Procedure Laterality Date  . ABDOMINAL HYSTERECTOMY  1975   total  .  BREAST BIOPSY Right 04/09/2012   x 2, FIBROADENOMA WITH COARSE CALCIFICATIONS.done by byrnett  . BREAST BIOPSY Right 09/04/2017   Affirm Bx- X clip path pending  . CYSTECTOMY  2003  . ROUX-EN-Y GASTRIC BYPASS  2009  . TONSILLECTOMY  1970    Family History  Problem Relation Age of Onset  . Alzheimer's disease Mother   . Heart murmur Mother   . Dementia Mother   . AAA (abdominal aortic aneurysm) Father   . Heart disease Father   . Asthma Sister   . Hypertension Sister   . Cancer Sister        breast  . Breast cancer Sister        late 2440's  . Heart disease Brother   . Hypertension Brother   . Aneurysm Paternal Aunt   . Cancer Paternal Grandmother        possible, not sure what kind  . Lung cancer Paternal Uncle   . Cancer Cousin     Social History   Socioeconomic History  . Marital status: Married    Spouse name: Loraine LericheMark  . Number of children: 2  . Years of education: Not on file  . Highest education level: Some college, no degree  Occupational History  . Not on file  Social Needs  . Financial resource strain: Very hard  . Food insecurity    Worry: Never true    Inability: Never true  . Transportation needs    Medical: No    Non-medical: No  Tobacco Use  . Smoking status: Never Smoker  . Smokeless tobacco: Never Used  Substance and Sexual Activity  . Alcohol use: No  . Drug use: No  . Sexual activity: Not Currently  Lifestyle  . Physical activity    Days per week: 5 days    Minutes per session: 40 min  . Stress: Not at all  Relationships  . Social connections    Talks on phone: More than three times a week    Gets together: More than three times a week    Attends religious service: Never    Active member of club or organization: No    Attends meetings of clubs or organizations: Never    Relationship status: Married  . Intimate partner violence    Fear of current or ex partner: No    Emotionally abused: No    Physically abused: No    Forced sexual  activity: No  Other Topics Concern  . Not on file  Social History Narrative  . Not on file     Current Outpatient Medications:  .  atorvastatin (LIPITOR) 80 MG tablet, TAKE 1 TABLET (80 MG) BY MOUTH AT BEDTIME, Disp: 90 tablet, Rfl: 1 .  Calcium Carbonate-Vitamin D (CALTRATE 600+D PO), Take by mouth daily., Disp: , Rfl:  .  chlorthalidone (HYGROTON) 50 MG tablet, Take 1 tablet (50 mg total) by mouth daily., Disp: 90 tablet, Rfl: 1 .  Cholecalciferol (VITAMIN D) 2000 units tablet, One by  mouth three or four days per week, Disp: , Rfl:  .  lisinopril (ZESTRIL) 10 MG tablet, Take 1 tablet (10 mg total) by mouth daily., Disp: 90 tablet, Rfl: 3 .  metFORMIN (GLUCOPHAGE) 500 MG tablet, Take 1 tablet (500 mg total) by mouth 2 (two) times daily., Disp: 180 tablet, Rfl: 1 .  Multiple Vitamin (MULTIVITAMIN) tablet, Take 1 tablet by mouth daily., Disp: , Rfl:  .  pioglitazone (ACTOS) 30 MG tablet, TAKE 1 TABLET(30 MG) BY MOUTH DAILY FOR DIABETES, Disp: 90 tablet, Rfl: 0 .  Potassium (GNP POTASSIUM) 99 MG TABS, Take by mouth., Disp: , Rfl:  .  TURMERIC PO, Take 1 capsule by mouth at bedtime., Disp: , Rfl:  .  vitamin B-12 (CYANOCOBALAMIN) 100 MCG tablet, Take by mouth daily., Disp: , Rfl:  .  vitamin E 100 UNIT capsule, Take by mouth daily. Reported on 08/19/2015, Disp: , Rfl:  .  gabapentin (NEURONTIN) 100 MG capsule, Take by mouth., Disp: , Rfl:   Allergies  Allergen Reactions  . Codeine Nausea Only  . Secukinumab Other (See Comments)    Made her throat raw for 2 months so is not taking this now    I personally reviewed active problem list, medication list, allergies, family history, social history, health maintenance, notes from last encounter, lab results with the patient/caregiver today.  Review of Systems  Constitutional: Negative.   HENT: Negative.   Eyes: Negative.   Respiratory: Negative.   Cardiovascular: Negative.   Gastrointestinal: Negative.   Endocrine: Negative.    Genitourinary: Negative.   Musculoskeletal: Negative.   Skin: Negative.   Allergic/Immunologic: Negative.   Neurological: Negative.   Hematological: Negative.   Psychiatric/Behavioral: Negative.   All other systems reviewed and are negative.     Objective:    Virtual encounter, vitals limited, only able to obtain the following Today's Vitals   11/05/18 1318  BP: 125/69  Weight: 173 lb (78.5 kg)   Body mass index is 29.7 kg/m. Nursing Note and Vital Signs reviewed.  Physical Exam Vitals signs and nursing note reviewed.  Constitutional:      General: She is not in acute distress.    Appearance: Normal appearance. She is well-developed. She is not ill-appearing, toxic-appearing or diaphoretic.  HENT:     Head: Normocephalic and atraumatic.     Nose: Nose normal.  Eyes:     General: No scleral icterus.       Right eye: No discharge.        Left eye: No discharge.     Conjunctiva/sclera: Conjunctivae normal.  Neck:     Trachea: No tracheal deviation.  Cardiovascular:     Rate and Rhythm: Normal rate.  Pulmonary:     Effort: Pulmonary effort is normal. No respiratory distress.     Breath sounds: No stridor.  Musculoskeletal: Normal range of motion.     Right lower leg: No edema.     Left lower leg: No edema.  Skin:    Coloration: Skin is not jaundiced or pale.     Findings: No rash.  Neurological:     Mental Status: She is alert.     Motor: No abnormal muscle tone.     Coordination: Coordination normal.  Psychiatric:        Mood and Affect: Mood normal.        Behavior: Behavior normal.    PE limited by telephone encounter  Recent labs reviewed during encounter Recent Results (from the past 2160 hour(s))  CBC with Differential     Status: Abnormal   Collection Time: 08/26/18 10:08 AM  Result Value Ref Range   WBC 7.7 3.8 - 10.8 Thousand/uL   RBC 4.28 3.80 - 5.10 Million/uL   Hemoglobin 12.8 11.7 - 15.5 g/dL   HCT 40.9 81.1 - 91.4 %   MCV 89.7 80.0 -  100.0 fL   MCH 29.9 27.0 - 33.0 pg   MCHC 33.3 32.0 - 36.0 g/dL   RDW 78.2 95.6 - 21.3 %   Platelets 444 (H) 140 - 400 Thousand/uL   MPV 10.1 7.5 - 12.5 fL   Neutro Abs 3,696 1,500 - 7,800 cells/uL   Lymphs Abs 3,249 850 - 3,900 cells/uL   Absolute Monocytes 493 200 - 950 cells/uL   Eosinophils Absolute 200 15 - 500 cells/uL   Basophils Absolute 62 0 - 200 cells/uL   Neutrophils Relative % 48 %   Total Lymphocyte 42.2 %   Monocytes Relative 6.4 %   Eosinophils Relative 2.6 %   Basophils Relative 0.8 %  HgB A1c     Status: Abnormal   Collection Time: 08/26/18 10:08 AM  Result Value Ref Range   Hgb A1c MFr Bld 7.2 (H) <5.7 % of total Hgb    Comment: For someone without known diabetes, a hemoglobin A1c value of 6.5% or greater indicates that they may have  diabetes and this should be confirmed with a follow-up  test. . For someone with known diabetes, a value <7% indicates  that their diabetes is well controlled and a value  greater than or equal to 7% indicates suboptimal  control. A1c targets should be individualized based on  duration of diabetes, age, comorbid conditions, and  other considerations. . Currently, no consensus exists regarding use of hemoglobin A1c for diagnosis of diabetes for children. .    Mean Plasma Glucose 160 (calc)   eAG (mmol/L) 8.9 (calc)  Lipid Profile     Status: Abnormal   Collection Time: 08/26/18 10:08 AM  Result Value Ref Range   Cholesterol 106 <200 mg/dL   HDL 33 (L) > OR = 50 mg/dL   Triglycerides 086 <578 mg/dL   LDL Cholesterol (Calc) 54 mg/dL (calc)    Comment: Reference range: <100 . Desirable range <100 mg/dL for primary prevention;   <70 mg/dL for patients with CHD or diabetic patients  with > or = 2 CHD risk factors. Marland Kitchen LDL-C is now calculated using the Martin-Hopkins  calculation, which is a validated novel method providing  better accuracy than the Friedewald equation in the  estimation of LDL-C.  Horald Pollen et al. Lenox Ahr.  4696;295(28): 2061-2068  (http://education.[facility].com/faq/FAQ164)    Total CHOL/HDL Ratio 3.2 <5.0 (calc)   Non-HDL Cholesterol (Calc) 73 <413 mg/dL (calc)    Comment: For patients with diabetes plus 1 major ASCVD risk  factor, treating to a non-HDL-C goal of <100 mg/dL  (LDL-C of <24 mg/dL) is considered a therapeutic  option.   COMPLETE METABOLIC PANEL WITH GFR     Status: Abnormal   Collection Time: 08/26/18 10:08 AM  Result Value Ref Range   Glucose, Bld 149 (H) 65 - 99 mg/dL    Comment: .            Fasting reference interval . For someone without known diabetes, a glucose value >125 mg/dL indicates that they may have diabetes and this should be confirmed with a follow-up test. .    BUN 18 7 - 25 mg/dL   Creat 4.01  0.50 - 0.99 mg/dL    Comment: For patients >76 years of age, the reference limit for Creatinine is approximately 13% higher for people identified as African-American. .    GFR, Est Non African American 82 > OR = 60 mL/min/1.2m2   GFR, Est African American 96 > OR = 60 mL/min/1.78m2   BUN/Creatinine Ratio NOT APPLICABLE 6 - 22 (calc)   Sodium 137 135 - 146 mmol/L   Potassium 3.6 3.5 - 5.3 mmol/L   Chloride 99 98 - 110 mmol/L   CO2 27 20 - 32 mmol/L   Calcium 9.5 8.6 - 10.4 mg/dL   Total Protein 6.6 6.1 - 8.1 g/dL   Albumin 4.3 3.6 - 5.1 g/dL   Globulin 2.3 1.9 - 3.7 g/dL (calc)   AG Ratio 1.9 1.0 - 2.5 (calc)   Total Bilirubin 0.4 0.2 - 1.2 mg/dL   Alkaline phosphatase (APISO) 36 (L) 37 - 153 U/L   AST 18 10 - 35 U/L   ALT 9 6 - 29 U/L  Hepatitis C antibody     Status: None   Collection Time: 08/26/18 10:08 AM  Result Value Ref Range   Hepatitis C Ab NON-REACTIVE NON-REACTI   SIGNAL TO CUT-OFF 0.01 <1.00    Comment: . HCV antibody was non-reactive. There is no laboratory  evidence of HCV infection. . In most cases, no further action is required. However, if recent HCV exposure is suspected, a test for HCV RNA (test code 96045) is  suggested. . For additional information please refer to http://education.[facility].com/faq/FAQ22v1 (This link is being provided for informational/ educational purposes only.) .     PHQ2/9: Depression screen [facility] 2/9 11/05/2018 08/21/2018 05/27/2018 05/21/2018 02/20/2018  Decreased Interest 0 0 0 0 0  Down, Depressed, Hopeless 0 0 0 0 0  PHQ - 2 Score 0 0 0 0 0  Altered sleeping 0 0 0 0 0  Tired, decreased energy 0 0 0 0 0  Change in appetite 0 0 0 0 0  Feeling bad or failure about yourself  0 0 0 0 0  Trouble concentrating 0 0 0 0 0  Moving slowly or fidgety/restless 0 0 0 0 0  Suicidal thoughts 0 0 0 0 0  PHQ-9 Score 0 0 0 0 0  Difficult doing work/chores Not difficult at all Not difficult at all Not difficult at all Not difficult at all Not difficult at all   PHQ-2/9 Result is negative.    Fall Risk: Fall Risk  11/05/2018 08/21/2018 05/27/2018 05/21/2018 02/20/2018  Falls in the past year? 0 Number falls in past yr: 0 Injury with Fall? 0 0 1 0 0     Assessment and Plan:   68 y/o female presents for routine f/up and med refills on her chronic conditions as outlined below   1. Mixed hyperlipidemia Labs done 2 months ago, cholesterol well controlled, no concerns or side effects from medication Encouraged to work on diet and lifestyle Four-month follow-up - atorvastatin (LIPITOR) 80 MG tablet; TAKE 1 TABLET (80 MG) BY MOUTH AT BEDTIME  Dispense: 90 tablet; Refill: 1  2. Essential hypertension Well-controlled on chlorthalidone and lisinopril, she has normal renal function and electrolytes No side effects or concerns 57-month follow-up - chlorthalidone (HYGROTON) 50 MG tablet; Take 1 tablet (50 mg total) by mouth daily.  Dispense: 90 tablet; Refill: 1  3. Microalbuminuria due to type 2 diabetes mellitus (HCC) Patient's A1c has improved to 7.2  She is on metformin and Actos, she feels like she may be having hypoglycemic episodes prior to lunchtime, taking  all meds at bedtime and she is not monitoring her blood sugar Encouraged to check her blood sugar and blood pressure when she has episodes before lunch, notify me of the readings Start taking Actos medicine in the morning Four-month follow-up Next appointment will need to do foot exam and urine microalbumin   - metFORMIN (GLUCOPHAGE) 500 MG tablet; Take 1 tablet (500 mg total) by mouth 2 (two) times daily.  Dispense: 180 tablet; Refill: 1    I discussed the assessment and treatment plan with the patient. The patient was provided an opportunity to ask questions and all were answered. The patient agreed with the plan and demonstrated an understanding of the instructions.  The patient was advised to call back or seek an in-person evaluation if the symptoms worsen or if the condition fails to improve as anticipated.  I provided 23 minutes of non-face-to-face time during this encounter.  Delsa Grana, PA-C 09/29/201:29 PM

## 2018-11-06 DIAGNOSIS — M9903 Segmental and somatic dysfunction of lumbar region: Secondary | ICD-10-CM | POA: Diagnosis not present

## 2018-11-06 DIAGNOSIS — M955 Acquired deformity of pelvis: Secondary | ICD-10-CM | POA: Diagnosis not present

## 2018-11-06 DIAGNOSIS — M4306 Spondylolysis, lumbar region: Secondary | ICD-10-CM | POA: Diagnosis not present

## 2018-11-06 DIAGNOSIS — M9905 Segmental and somatic dysfunction of pelvic region: Secondary | ICD-10-CM | POA: Diagnosis not present

## 2018-11-20 DIAGNOSIS — M4306 Spondylolysis, lumbar region: Secondary | ICD-10-CM | POA: Diagnosis not present

## 2018-11-20 DIAGNOSIS — M9903 Segmental and somatic dysfunction of lumbar region: Secondary | ICD-10-CM | POA: Diagnosis not present

## 2018-11-20 DIAGNOSIS — M9905 Segmental and somatic dysfunction of pelvic region: Secondary | ICD-10-CM | POA: Diagnosis not present

## 2018-11-20 DIAGNOSIS — M955 Acquired deformity of pelvis: Secondary | ICD-10-CM | POA: Diagnosis not present

## 2018-11-26 DIAGNOSIS — M810 Age-related osteoporosis without current pathological fracture: Secondary | ICD-10-CM | POA: Diagnosis not present

## 2018-12-18 DIAGNOSIS — M9905 Segmental and somatic dysfunction of pelvic region: Secondary | ICD-10-CM | POA: Diagnosis not present

## 2018-12-18 DIAGNOSIS — M4306 Spondylolysis, lumbar region: Secondary | ICD-10-CM | POA: Diagnosis not present

## 2018-12-18 DIAGNOSIS — M9903 Segmental and somatic dysfunction of lumbar region: Secondary | ICD-10-CM | POA: Diagnosis not present

## 2018-12-18 DIAGNOSIS — M955 Acquired deformity of pelvis: Secondary | ICD-10-CM | POA: Diagnosis not present

## 2019-01-15 DIAGNOSIS — M9903 Segmental and somatic dysfunction of lumbar region: Secondary | ICD-10-CM | POA: Diagnosis not present

## 2019-01-15 DIAGNOSIS — M9905 Segmental and somatic dysfunction of pelvic region: Secondary | ICD-10-CM | POA: Diagnosis not present

## 2019-01-15 DIAGNOSIS — M955 Acquired deformity of pelvis: Secondary | ICD-10-CM | POA: Diagnosis not present

## 2019-01-15 DIAGNOSIS — M4306 Spondylolysis, lumbar region: Secondary | ICD-10-CM | POA: Diagnosis not present

## 2019-01-29 DIAGNOSIS — M955 Acquired deformity of pelvis: Secondary | ICD-10-CM | POA: Diagnosis not present

## 2019-01-29 DIAGNOSIS — M4306 Spondylolysis, lumbar region: Secondary | ICD-10-CM | POA: Diagnosis not present

## 2019-01-29 DIAGNOSIS — M9905 Segmental and somatic dysfunction of pelvic region: Secondary | ICD-10-CM | POA: Diagnosis not present

## 2019-01-29 DIAGNOSIS — M9903 Segmental and somatic dysfunction of lumbar region: Secondary | ICD-10-CM | POA: Diagnosis not present

## 2019-02-05 DIAGNOSIS — M9905 Segmental and somatic dysfunction of pelvic region: Secondary | ICD-10-CM | POA: Diagnosis not present

## 2019-02-05 DIAGNOSIS — M955 Acquired deformity of pelvis: Secondary | ICD-10-CM | POA: Diagnosis not present

## 2019-02-05 DIAGNOSIS — M4306 Spondylolysis, lumbar region: Secondary | ICD-10-CM | POA: Diagnosis not present

## 2019-02-05 DIAGNOSIS — M9903 Segmental and somatic dysfunction of lumbar region: Secondary | ICD-10-CM | POA: Diagnosis not present

## 2019-03-07 ENCOUNTER — Ambulatory Visit: Payer: Medicare Other | Admitting: Family Medicine

## 2019-03-18 DIAGNOSIS — M4306 Spondylolysis, lumbar region: Secondary | ICD-10-CM | POA: Diagnosis not present

## 2019-03-18 DIAGNOSIS — M955 Acquired deformity of pelvis: Secondary | ICD-10-CM | POA: Diagnosis not present

## 2019-03-18 DIAGNOSIS — M9903 Segmental and somatic dysfunction of lumbar region: Secondary | ICD-10-CM | POA: Diagnosis not present

## 2019-03-18 DIAGNOSIS — M9905 Segmental and somatic dysfunction of pelvic region: Secondary | ICD-10-CM | POA: Diagnosis not present

## 2019-03-25 DIAGNOSIS — R208 Other disturbances of skin sensation: Secondary | ICD-10-CM | POA: Diagnosis not present

## 2019-03-25 DIAGNOSIS — R42 Dizziness and giddiness: Secondary | ICD-10-CM | POA: Insufficient documentation

## 2019-03-25 DIAGNOSIS — R269 Unspecified abnormalities of gait and mobility: Secondary | ICD-10-CM | POA: Insufficient documentation

## 2019-04-15 DIAGNOSIS — M9903 Segmental and somatic dysfunction of lumbar region: Secondary | ICD-10-CM | POA: Diagnosis not present

## 2019-04-15 DIAGNOSIS — M9905 Segmental and somatic dysfunction of pelvic region: Secondary | ICD-10-CM | POA: Diagnosis not present

## 2019-04-15 DIAGNOSIS — M955 Acquired deformity of pelvis: Secondary | ICD-10-CM | POA: Diagnosis not present

## 2019-04-15 DIAGNOSIS — M4306 Spondylolysis, lumbar region: Secondary | ICD-10-CM | POA: Diagnosis not present

## 2019-04-16 DIAGNOSIS — M9905 Segmental and somatic dysfunction of pelvic region: Secondary | ICD-10-CM | POA: Diagnosis not present

## 2019-04-16 DIAGNOSIS — M9903 Segmental and somatic dysfunction of lumbar region: Secondary | ICD-10-CM | POA: Diagnosis not present

## 2019-04-16 DIAGNOSIS — M4306 Spondylolysis, lumbar region: Secondary | ICD-10-CM | POA: Diagnosis not present

## 2019-04-16 DIAGNOSIS — M955 Acquired deformity of pelvis: Secondary | ICD-10-CM | POA: Diagnosis not present

## 2019-04-18 DIAGNOSIS — M4306 Spondylolysis, lumbar region: Secondary | ICD-10-CM | POA: Diagnosis not present

## 2019-04-18 DIAGNOSIS — M955 Acquired deformity of pelvis: Secondary | ICD-10-CM | POA: Diagnosis not present

## 2019-04-18 DIAGNOSIS — M9905 Segmental and somatic dysfunction of pelvic region: Secondary | ICD-10-CM | POA: Diagnosis not present

## 2019-04-18 DIAGNOSIS — M9903 Segmental and somatic dysfunction of lumbar region: Secondary | ICD-10-CM | POA: Diagnosis not present

## 2019-04-21 DIAGNOSIS — M9903 Segmental and somatic dysfunction of lumbar region: Secondary | ICD-10-CM | POA: Diagnosis not present

## 2019-04-21 DIAGNOSIS — M9905 Segmental and somatic dysfunction of pelvic region: Secondary | ICD-10-CM | POA: Diagnosis not present

## 2019-04-21 DIAGNOSIS — M810 Age-related osteoporosis without current pathological fracture: Secondary | ICD-10-CM | POA: Diagnosis not present

## 2019-04-21 DIAGNOSIS — M955 Acquired deformity of pelvis: Secondary | ICD-10-CM | POA: Diagnosis not present

## 2019-04-21 DIAGNOSIS — M4306 Spondylolysis, lumbar region: Secondary | ICD-10-CM | POA: Diagnosis not present

## 2019-04-23 DIAGNOSIS — M4306 Spondylolysis, lumbar region: Secondary | ICD-10-CM | POA: Diagnosis not present

## 2019-04-23 DIAGNOSIS — M9905 Segmental and somatic dysfunction of pelvic region: Secondary | ICD-10-CM | POA: Diagnosis not present

## 2019-04-23 DIAGNOSIS — M9903 Segmental and somatic dysfunction of lumbar region: Secondary | ICD-10-CM | POA: Diagnosis not present

## 2019-04-23 DIAGNOSIS — M955 Acquired deformity of pelvis: Secondary | ICD-10-CM | POA: Diagnosis not present

## 2019-04-28 ENCOUNTER — Other Ambulatory Visit: Payer: Self-pay

## 2019-04-28 ENCOUNTER — Encounter: Payer: Medicare Other | Admitting: Family Medicine

## 2019-04-28 DIAGNOSIS — M955 Acquired deformity of pelvis: Secondary | ICD-10-CM | POA: Diagnosis not present

## 2019-04-28 DIAGNOSIS — M9903 Segmental and somatic dysfunction of lumbar region: Secondary | ICD-10-CM | POA: Diagnosis not present

## 2019-04-28 DIAGNOSIS — E11649 Type 2 diabetes mellitus with hypoglycemia without coma: Secondary | ICD-10-CM

## 2019-04-28 DIAGNOSIS — M4306 Spondylolysis, lumbar region: Secondary | ICD-10-CM | POA: Diagnosis not present

## 2019-04-28 DIAGNOSIS — I10 Essential (primary) hypertension: Secondary | ICD-10-CM

## 2019-04-28 DIAGNOSIS — M9905 Segmental and somatic dysfunction of pelvic region: Secondary | ICD-10-CM | POA: Diagnosis not present

## 2019-04-28 NOTE — Telephone Encounter (Signed)
Hypertension medication request:[id number]  Last office visit pertaining to hypertension:  BP Readings from Last 3 Encounters:  11/05/18 125/69  08/21/18 128/70  05/27/18 128/68    Lab Results  Component Value Date   CREATININE 0.75 08/26/2018   BUN 18 08/26/2018   NA 137 08/26/2018   K 3.6 08/26/2018   CL 99 08/26/2018   CO2 27 08/26/2018     No follow-ups on file.

## 2019-04-30 DIAGNOSIS — M9903 Segmental and somatic dysfunction of lumbar region: Secondary | ICD-10-CM | POA: Diagnosis not present

## 2019-04-30 DIAGNOSIS — M9905 Segmental and somatic dysfunction of pelvic region: Secondary | ICD-10-CM | POA: Diagnosis not present

## 2019-04-30 DIAGNOSIS — M955 Acquired deformity of pelvis: Secondary | ICD-10-CM | POA: Diagnosis not present

## 2019-04-30 DIAGNOSIS — M4306 Spondylolysis, lumbar region: Secondary | ICD-10-CM | POA: Diagnosis not present

## 2019-04-30 MED ORDER — LISINOPRIL 10 MG PO TABS: 10.0000 mg | ORAL_TABLET | Freq: Every day | ORAL | 0 refills | Status: DC

## 2019-04-30 NOTE — Telephone Encounter (Signed)
Patient is due for an in person office visit and due for labs -she is currently scheduled we will put in 90-day supply and refills once office visit labs are completed

## 2019-05-01 NOTE — Telephone Encounter (Signed)
Pt has appt scheduled for April with Sheliah Mends

## 2019-05-02 DIAGNOSIS — R42 Dizziness and giddiness: Secondary | ICD-10-CM | POA: Diagnosis not present

## 2019-05-05 ENCOUNTER — Other Ambulatory Visit: Payer: Self-pay | Admitting: Neurology

## 2019-05-05 DIAGNOSIS — M9905 Segmental and somatic dysfunction of pelvic region: Secondary | ICD-10-CM | POA: Diagnosis not present

## 2019-05-05 DIAGNOSIS — R42 Dizziness and giddiness: Secondary | ICD-10-CM

## 2019-05-05 DIAGNOSIS — M955 Acquired deformity of pelvis: Secondary | ICD-10-CM | POA: Diagnosis not present

## 2019-05-05 DIAGNOSIS — M9903 Segmental and somatic dysfunction of lumbar region: Secondary | ICD-10-CM | POA: Diagnosis not present

## 2019-05-05 DIAGNOSIS — M4306 Spondylolysis, lumbar region: Secondary | ICD-10-CM | POA: Diagnosis not present

## 2019-05-06 DIAGNOSIS — E538 Deficiency of other specified B group vitamins: Secondary | ICD-10-CM | POA: Diagnosis not present

## 2019-05-06 DIAGNOSIS — R42 Dizziness and giddiness: Secondary | ICD-10-CM | POA: Diagnosis not present

## 2019-05-06 DIAGNOSIS — Z79899 Other long term (current) drug therapy: Secondary | ICD-10-CM | POA: Diagnosis not present

## 2019-05-09 ENCOUNTER — Ambulatory Visit: Payer: Medicare Other | Attending: Neurology | Admitting: Physical Therapy

## 2019-05-09 ENCOUNTER — Encounter: Payer: Self-pay | Admitting: Physical Therapy

## 2019-05-09 DIAGNOSIS — R2681 Unsteadiness on feet: Secondary | ICD-10-CM | POA: Diagnosis not present

## 2019-05-09 DIAGNOSIS — R42 Dizziness and giddiness: Secondary | ICD-10-CM | POA: Insufficient documentation

## 2019-05-09 NOTE — Therapy (Signed)
[facility] [address]. [place], [place], [address] Phone: [phone]   Fax:  [phone]  Physical Therapy Evaluation  Patient Details  Name: Sandy Salinas MRN: [id number] Date of Birth: 10/09/1950 Referring Provider (PT): Dr. Steele Sizer   Encounter Date: 05/09/2019  PT End of Session - 05/09/19 1033    Visit Number  1    Number of Visits  9    Date for PT Re-Evaluation  07/04/19    PT Start Time  0947    PT Stop Time  1052    PT Time Calculation (min)  65 min    Equipment Utilized During Treatment  Gait belt    Activity Tolerance  Patient tolerated treatment well    Behavior During Therapy  [facility] for tasks assessed/performed       Past Medical History:  Diagnosis Date  . 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 Breast calcification seen on mammogram 12/01/2016   LEFT  . Diabetes mellitus without complication (HCC)   . Family history of abdominal aortic aneurysm (AAA) 05/12/2016   father  . Hyperlipidemia   . Hypertension   . Obesity   . Osteoporosis   . Primary osteoarthritis of left knee   . Psoriasis, unspecified     Past Surgical History:  Procedure Laterality Date  . ABDOMINAL HYSTERECTOMY  1975   total  . BREAST BIOPSY Right 04/09/2012   x 2, FIBROADENOMA WITH COARSE CALCIFICATIONS.done by byrnett  . BREAST BIOPSY Right 09/04/2017   Affirm Bx- X clip path pending  . CYSTECTOMY  2003  . ROUX-EN-Y GASTRIC BYPASS  2009  . TONSILLECTOMY  1970    There were no vitals filed for this visit.   Subjective Assessment - 05/09/19 1115    Subjective  Patient reports that the dizziness and imbalance have been going on for a few months. Patient states she is most concerned about her balance and states she does not want to fall.    Pertinent History  Patient was referred by Dr. Steele Sizer, neurologist. Patient reports she first started getting dizziness a few months  ago. Patient reports the dizziness last minutes. Patient reports bending over and coming back up and turning bring on the dizziness and reports standing still makes it better. Patient reports her "balance has been horrible" and states she has veering when walking. Patient reports she has not fallen, but states she has come close. Patient reports that she has multiple episodes of reaching for support to regain her balance. Patient reports that she is concerned about her balance. Patient reports she is getting dizziness a few times a week. Patient reports some lightheadedness when she stand up after bending forward. Patient reports vertigo and right ear aural fullness. Patients symptoms are motion provoked, positional and intermittent in nature. Patient reports that she has tried to continue to be active and states she mowed 4 yards and weeded 3 yards last week.    Patient Stated Goals  to decrease dizziness and improve her balance    Currently in Pain?  --   none stated        [facility] PT Assessment - 05/09/19 1028      Assessment   Medical Diagnosis  dizziness and giddiness    Referring Provider (PT)  Dr. Steele Sizer    Onset Date/Surgical Date  --   Jan 2021   Prior Therapy  no prior  vestibular therapy      Precautions   Precautions  Fall      Restrictions   Weight Bearing Restrictions  No      Balance Screen   Has the patient fallen in the past 6 months  Yes    How many times?  4    Has the patient had a decrease in activity level because of a fear of falling?   No    Is the patient reluctant to leave their home because of a fear of falling?   No      Home Film/video editor residence    Living Arrangements  Spouse/significant other    Available Help at Discharge  Family;Friend(s)    Type of Home  House      Prior Function   Level of [place] with community mobility without device      Cognition   Overall Cognitive Status  Within Functional  Limits for tasks assessed      Standardized Balance Assessment   Standardized Balance Assessment  Dynamic Gait Index      Dynamic Gait Index   Level Surface  Normal    Change in Gait Speed  Normal    Gait with Horizontal Head Turns  Moderate Impairment    Gait with Vertical Head Turns  Mild Impairment    Gait and Pivot Turn  Normal   increased sway with right turns and this recreated dizziness   Step Over Obstacle  Normal    Step Around Obstacles  Normal    Steps  Normal    Total Score  21        VESTIBULAR AND BALANCE EVALUATION  HISTORY:  Subjective history of current problem: Patient was referred by Dr. Joselyn Arrow, neurologist. Patient reports she first started getting dizziness a few months ago. Patient reports the dizziness last minutes. Patient reports bending over and coming back up and turning bring on the dizziness and reports standing still makes it better. Patient reports her "balance has been horrible" and states she has veering when walking. Patient reports she has not fallen, but states she has come close. Patient reports that she has multiple episodes of reaching for support to regain her balance. Patient reports that she is concerned about her balance. Patient reports she is getting dizziness a few times a week. Patient reports some lightheadedness when she stand up after bending forward. Patient reports vertigo and right ear aural fullness. Patients symptoms are motion provoked, positional and intermittent in nature. Patient reports that she has tried to continue to be active and states she mowed 4 yards and weeded 3 yards last week.   Description of dizziness: vertigo, unsteadiness, lightheadedness, falling, aural fullness Symptom nature: motion provoked, positional, intermittent  Progression of symptoms: no change since onset History of similar episodes: denies  Falls (yes/no): yes Number of falls in past 6 months: 3 or 4 and near misses where she reaches to regain  balance. One fall was a mechanical fall tripping over items.   Auditory complaints (tinnitus, pain, drainage): denies Vision (last eye exam, diplopia, recent changes): wears glasses; reports goes next month for her yearly eye exam; denies changes in vision    EXAMINATION  POSTURE:  Rounded shoulders, forward head  SOMATOSENSORY:  Any N & T in extremities or weakness: denies      COORDINATION: Finger to Nose: Normal Past Pointing:  Left and  Right  Normal  MUSCULOSKELETAL SCREEN: Cervical Spine  ROM: Cervical AROM WFL without pain flexion, left and right rotation and extension   Gait: Patient arrives ambulating without AD. Patient ambulates with fair cadence with step through gait pattern with veering at times when turning her head.  Scanning of visual environment with gait is: fair  Balance: Patient is challenged by narrow BOS, uneven surfaces, EC, ambulation with head turns and quick turns.  POSTURAL CONTROL TESTS:  Clinical Test of Sensory Interaction for Balance (CTSIB):  CONDITION TIME STRATEGY SWAY  Eyes open, firm surface 30 seconds ankle +2  Eyes closed, firm surface 24 seconds ankle +4  Eyes open, foam surface 30 seconds ankle +2  Eyes closed, foam surface 3 seconds ankle +4    OCULOMOTOR / VESTIBULAR TESTING:  Oculomotor Exam- Room Light  Normal Abnormal Comments  Ocular Alignment N    Ocular ROM N    Spontaneous Nystagmus N    Gaze evoked Nystagmus  Abn End gaze nystagmus noted at left and right end fields  Smooth Pursuit N    Saccades  Abn Hypometric saccades especially with left field vision  VOR  Abn Blurring of target; denies dizziness  VOR Cancellation N    Left Head Impulse N  Did note one or two saccades   Right Head Impulse N     BPPV TESTS:  Symptoms Duration Intensity Nystagmus  Left Dix-Hallpike None  N/A None observed  Right Dix-Hallpike Nausea but denies dizziness  N/A None observed  Left Head Roll None  N/A None observed  Right Head  Roll None  N/A None observed    FUNCTIONAL OUTCOME MEASURES:  Results Comments  DHI   10/100 low perception of handicap  ABC Scale    52.5% High Falls risk; in need of intervention  DGI   21/24 Mild impairment; in need of intervention  FOTO  93/100 Given the patient's risk adjustment variables, like-patients nationally had a FS score of 68/100 at intake     Neuromuscular Re-education:  VOR X 1 exercise:  Demonstrated and educated as to VOR X1.  Patient performed VOR X 1 horizontal in sitting 2 reps of 1 minute each with verbal cues for technique.  Patient reports the target is blurring, but denies dizziness.  Issued for HEP  Non-compliant/ Firm Surface: On firm surface, patient performed feet together progressions and semi-tandem progressions with alternating lead leg with and without horizontal and vertical head turns with CGA. Noted ankle reactions and increased sway.   Patient reports this activity is challenging for her balance.  Issued for HEP   PT Education - 05/09/19 1117    Education Details  discussed plan of care and goals; issued VOR X1 in sitting and in standing on firm surface feet together and semi-tandem progressions with vertical and horizontal head turns    Person(s) Educated  Patient    Methods  Explanation;Demonstration;Handout;Verbal cues    Comprehension  Returned demonstration;Verbalized understanding       PT Short Term Goals - 05/09/19 1121      PT SHORT TERM GOAL #1   Title  Pt will be independent with HEP in order to improve balance and decrease dizziness symptoms in order to decrease fall risk and improve function at home and in the community.    Time  4    Period  Weeks    Status  New    Target Date  06/06/19        PT Long Term Goals - 05/09/19 1121      PT  LONG TERM GOAL #1   Title  Patient will reduce falls risk as indicated by Activities Specific Balance Confidence Scale (ABC) >67%.    Baseline  05/09/19 socred 52.5%    Time  8     Period  Weeks    Status  New    Target Date  07/04/19      PT LONG TERM GOAL #2   Title  Patient will report 50% or greater improvement in her symptoms of dizziness and imbalance with provoking motions or positions    Time  8    Period  Weeks    Status  New    Target Date  07/04/19      PT LONG TERM GOAL #3   Title  Patient will be able to ambulate 200' or greater without veering while being able to turn her head in order to improve her balance and decrease her falls risk.    Baseline  demonstrated veering with horizontal head turns on 05/09/19    Time  8    Period  Weeks    Status  New    Target Date  07/04/19             Plan - 05/09/19 1119    Clinical Impression Statement  Patient reports onset of dizziness and imbalance symptoms began several months ago. Patient with negative Dix-Hallpike and horizontal canal testing despite her symptoms sounding similar to symptoms of BPPV. Patient noted to have abnormal saccades, VOR, gaze evoked nystagmus with vestibular testing. Patient with imbalance and dizziness noted with ambulation with head turning activities and demonstrated veering when walking. Patient scored 52.5% on the ABC scale indicating high falls risk. Patient scored 21/24 on the DGI indicating mild impairment. Patient would benefit from PT services in order to address goals and functional deficits and in order to try to improve patient's balance and decrease her falls risk.    Personal Factors and Comorbidities  Comorbidity 3+    Comorbidities  DM, HTN, hyperlipidemia    Examination-Activity Limitations  Bend    Stability/Clinical Decision Making  Evolving/Moderate complexity    Clinical Decision Making  Moderate    Rehab Potential  Good    PT Frequency  1x / week    PT Duration  8 weeks    PT Treatment/Interventions  Canalith Repostioning;Neuromuscular re-education;Balance training;Gait training;Stair training;Therapeutic activities;Therapeutic exercise;Patient/family  education;Vestibular    PT Next Visit Plan  review HEP and work on progressions; amb with head turns    PT Home Exercise Plan  VOR X 1 in sitting, feet together and semi-tandem stance with horizontal and vertical head turns on firm surface    Consulted and Agree with Plan of Care  Patient       Patient will benefit from skilled therapeutic intervention in order to improve the following deficits and impairments:  Dizziness, Decreased balance, Difficulty walking  Visit Diagnosis: Dizziness and giddiness  Unsteadiness on feet     Problem List Patient Active Problem List   Diagnosis Date Noted  . Microalbuminuria due to type 2 diabetes mellitus (HCC) 02/20/2018  . Obesity (BMI 30.0-34.9) 02/19/2017  . Breast calcification seen on mammogram 12/01/2016  . Age-related osteoporosis without current pathological fracture 11/08/2016  . 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 Xerosis of skin 12/13/2015  . Psoriatic arthritis (HCC) 11/18/2015  . Lumbar radiculopathy, acute 10/14/2015  . Vitamin D deficiency 08/18/2014  . Hyperlipidemia   .  Hypertension   . Primary osteoarthritis of left knee   . Diabetes mellitus type 2 in obese (HCC) 07/24/2014  . Status post bariatric surgery 07/24/2014   Mardelle Matte PT, DPT [phone] Mardelle Matte 05/09/2019, 11:33 AM  [facility] [address] [place], [place], [address] Phone: [phone]   Fax:  [phone]  Name: JASHAYLA GLATFELTER MRN: [id number] Date of Birth: [birth year]

## 2019-05-14 ENCOUNTER — Ambulatory Visit: Payer: Medicare Other

## 2019-05-15 ENCOUNTER — Encounter: Payer: Self-pay | Admitting: Family Medicine

## 2019-05-15 ENCOUNTER — Ambulatory Visit (INDEPENDENT_AMBULATORY_CARE_PROVIDER_SITE_OTHER): Payer: Medicare Other | Admitting: Family Medicine

## 2019-05-15 ENCOUNTER — Other Ambulatory Visit: Payer: Self-pay

## 2019-05-15 VITALS — BP 116/62 | HR 77 | Temp 97.9°F | Resp 14 | Ht 64.0 in | Wt 168.2 lb

## 2019-05-15 DIAGNOSIS — M25552 Pain in left hip: Secondary | ICD-10-CM

## 2019-05-15 DIAGNOSIS — E1129 Type 2 diabetes mellitus with other diabetic kidney complication: Secondary | ICD-10-CM | POA: Diagnosis not present

## 2019-05-15 DIAGNOSIS — R809 Proteinuria, unspecified: Secondary | ICD-10-CM | POA: Diagnosis not present

## 2019-05-15 DIAGNOSIS — M25562 Pain in left knee: Secondary | ICD-10-CM | POA: Diagnosis not present

## 2019-05-15 DIAGNOSIS — E669 Obesity, unspecified: Secondary | ICD-10-CM

## 2019-05-15 DIAGNOSIS — I10 Essential (primary) hypertension: Secondary | ICD-10-CM

## 2019-05-15 DIAGNOSIS — E782 Mixed hyperlipidemia: Secondary | ICD-10-CM

## 2019-05-15 DIAGNOSIS — E1169 Type 2 diabetes mellitus with other specified complication: Secondary | ICD-10-CM

## 2019-05-15 NOTE — Patient Instructions (Signed)
Iliotibial Band Syndrome  Iliotibial band syndrome (ITBS) is a condition that often causes knee pain. It can also cause pain in the outside of your hip, thigh, and knee. The iliotibial band is a strip of tissue that runs from the outside of your hip and down your thigh to the outside of your knee. Repeatedly bending and straightening your knee can irritate the iliotibial band. What are the causes? This condition is caused by inflammation and irritation from the friction of the iliotibial band moving over the thigh bone (femur) when you repeatedly bend and straighten your knee. What increases the risk? This condition is more likely to develop in people who:  Frequently change elevation during their workouts.  Run very long distances.  Recently increased the length or intensity of their workouts.  Run downhill often, or just started running downhill.  Ride a bike very far or often. You may also be at greater risk if you start a new workout routine without first warming up or if you have a job that requires you to bend, squat, or climb frequently. What are the signs or symptoms? Symptoms of this condition include:  Pain along the outside of your knee that may be worse with activity, especially running or going up and down stairs.  A "snapping" sensation over your knee.  Swelling on the outside of your knee.  Pain or a feeling of tightness in your hip. How is this diagnosed? This condition is diagnosed based on your symptoms, medical history, and physical exam. You may also see a health care provider who specializes in reducing pain and increasing mobility (physical therapist). A physical therapist may do an exam to check your balance, movement, and way of walking or running (gait) to see whether the way you move could contribute to your injury. You may also have tests to measure your strength, flexibility, and range of motion. How is this treated? Treatment for this condition  includes:  Resting and limiting exercise.  Returning to activities gradually.  Doing range-of-motion and strengthening exercises (physical therapy) as told by your health care provider.  Including low-impact activities, such as swimming, in your exercise routine. Follow these instructions at home:  If directed, apply ice to the injured area. ? Put ice in a plastic bag. ? Place a towel between your skin and the bag. ? Leave the ice on for 20 minutes, 2-3 times per day.  Return to your normal activities as told by your health care provider. Ask your health care provider what activities are safe for you.  Keep all follow-up visits with your health care provider. This is important. Contact a health care provider if:  Your pain does not improve or gets worse despite treatment. This information is not intended to replace advice given to you by your health care provider. Make sure you discuss any questions you have with your health care provider. Document Revised: 01/05/2017 Document Reviewed: 02/25/2016 Elsevier Patient Education  2020 Else[employer] Inc.   Iliotibial Bursitis  Iliotibial bursitis is inflammation of the bursa on the outside of the knee. A bursa is a fluid-filled sac that is often found near a joint. Bursas act as cushions to help tendons glide smoothly over bony surfaces during joint movement. The iliotibial bursa is located beneath a long tendon (iliotibial band) that connects muscles of the buttock, hip, and upper leg to the outside of the shin bone. This condition is also called iliotibial band friction syndrome. What are the causes? This condition is caused  by:  Repeated rubbing of the tendon over the bursa, which occurs when you do activities over and over. This friction causes fluid to build up inside the bursa.  The buildup of fluid inside the bursa causes it to swell. The swollen bursa causes pain in the area where it is located. What increases the risk? The  following factors may make you more likely to develop this condition:  Doing athletic activities that involve repetitive squatting, running, cutting, and side-to-side movements.  Overtraining, or starting a new athletic activity without gradually increasing your time and distance.  Participating in certain sports, such as: ? Basketball. ? Cross-country running. ? Football. ? Rugby. ? Racquet sports. ? Soccer. ? Volleyball. ? Cycling.  Being 84-42 years old and having knee arthritis.  Being a middle-aged woman who is overweight.  Having flat feet or knee deformities.  Having diabetes. What are the signs or symptoms? Symptoms of this condition include:  Pain on the outside of your knee. The pain may also be felt on the outside of your leg near the knee.  Tenderness when pressing on the side of your knee.  Knee swelling that may or may not include increased warmth or redness.  Pain that gets worse with activity such as: ? Kneeling. ? Walking down the stairs. ? Prolonged walking or running. How is this diagnosed? This condition is usually diagnosed based on your symptoms, your medical history, and a physical exam. During the exam, your health care provider will check your:  Knee motion.  Knee strength.  Amount of pain when the outside of your knee is touched or pressed on.  Ability to do activities such as walking or climbing stairs. Rarely, other tests may be done to rule out other causes of your symptoms. These tests may include:  MRI.  Ultrasound. How is this treated? Treatment for this condition may include:  Avoiding activities that cause pain and swelling.  Icing your knee.  Applying heat to your knee.  Wearing an elastic wrap or sleeve to support your knee.  Keeping your knee raised (elevated) when resting.  Taking medicine to reduce pain and swelling.  Having an injection of numbing medicine or anti-inflammatory medicine (steroid) into the bursa  to see if the pain will go away.  Doing stretching and strengthening exercises. Treatment usually improves the pain in 6-8 weeks. Surgery is sometimes needed to drain or remove the bursa. Follow these instructions at home: If you have a compression wrap or sleeve:  Wear it as told by your health care provider. Remove it only as told by your health care provider.  Loosen the wrap or sleeve if your foot or toes tingle, become numb, or turn cold and blue.  Keep the wrap or sleeve clean.  If the wrap or sleeve is not waterproof: ? Do not let it get wet. ? Cover it with a watertight covering when you take a bath or shower. Managing pain, stiffness, and swelling      If directed, put ice on the knee. ? If you have a removable wrap or sleeve, remove it as told by your health care provider. ? Put ice in a plastic bag. ? Place a towel between your skin and the bag. ? Leave the ice on for 20 minutes, 2-3 times a day.  Move your toes often to avoid stiffness and to lessen swelling.  Raise (elevate) your leg above the level of your heart while you are sitting or lying down.  If directed,  apply heat to the affected area. Use the heat source that your health care provider recommends, such as a moist heat pack or a heating pad. ? Place a towel between your skin and the heat source. ? Leave the heat on for 20-30 minutes. ? Remove the heat if your skin turns bright red. This is especially important if you are unable to feel pain, heat, or cold. You may have a greater risk of getting burned. Activity  Return to your normal activities as told by your health care provider. Ask your health care provider what activities are safe for you.  Do exercises as told by your health care provider. General instructions  Take over-the-counter and prescription medicines only as told by your health care provider.  Keep all follow-up visits as told by your health care provider. This is important. How is  this prevented?  Warm up and stretch before being active.  Cool down and stretch after being active.  Give your body time to rest between periods of activity.  Make sure to use equipment that fits you.  Maintain physical fitness, including: ? Strength. ? Flexibility. Contact a health care provider if:  You have pain that is not relieved by rest or treatment.  Your symptoms get worse or do not improve with home care. Summary  Iliotibial bursitis is inflammation of the bursa on the outside of the knee.  Symptoms of this condition include pain on the outside of the knee that gets worse with activity.  Treatment includes resting the knee, ice, compression, and sometimes pain medicines. This information is not intended to replace advice given to you by your health care provider. Make sure you discuss any questions you have with your health care provider. Document Revised: 05/16/2018 Document Reviewed: 07/11/2017 Elsevier Patient Education  [place].   Hip Bursitis  Hip bursitis is swelling of a fluid-filled sac (bursa) in your hip joint. This swelling (inflammation) can be painful. This condition may come and go over time. What are the causes?  Injury to the hip.  Overuse of the muscles that surround the hip joint.  An earlier injury or surgery of the hip.  Arthritis or gout.  Diabetes.  Thyroid disease.  Infection.  In some cases, the cause may not be known. What are the signs or symptoms?  Mild or moderate pain in the hip area. Pain may get worse with movement.  Tenderness and swelling of the hip, especially on the outer side of the hip.  In rare cases, the bursa may become infected. This may cause: ? A fever. ? Warmth and redness in the area. Symptoms may come and go. How is this treated? This condition is treated by resting, icing, applying pressure (compression), and raising (elevating) the injured area. You may hear this called the RICE  treatment. Treatment may also include:  Using crutches.  Draining fluid out of the bursa to help relieve swelling.  Giving a shot of (injecting) medicine that helps to reduce swelling (cortisone).  Other medicines if the bursa is infected. Follow these instructions at home: Managing pain, stiffness, and swelling   If told, put ice on the painful area. ? Put ice in a plastic bag. ? Place a towel between your skin and the bag. ? Leave the ice on for 20 minutes, 2-3 times a day. ? Raise (elevate) your hip above the level of your heart as much as you can without pain. To do this, try putting a pillow under your hips  while you lie down. Stop if this causes pain. Activity  Return to your normal activities as told by your doctor. Ask your doctor what activities are safe for you.  Rest and protect your hip as much as you can until you feel better. General instructions  Take over-the-counter and prescription medicines only as told by your doctor.  Wear wraps that put pressure on your hip (compression wraps) only as told by your doctor.  Do not use your hip to support your body weight until your doctor says that you can.  Use crutches as told by your doctor.  Gently rub and stretch your injured area as often as is comfortable.  Keep all follow-up visits as told by your doctor. This is important. How is this prevented?  Exercise regularly, as told by your doctor.  Warm up and stretch before being active.  Cool down and stretch after being active.  Avoid activities that bother your hip or cause pain.  Avoid sitting down for long periods at a time. Contact a doctor if:  You have a fever.  You get new symptoms.  You have trouble walking.  You have trouble doing everyday activities.  You have pain that gets worse.  You have pain that does not get better with medicine.  You get red skin on your hip area.  You get a feeling of warmth in your hip area. Get help right  away if:  You cannot move your hip.  You have very bad pain. Summary  Hip bursitis is swelling of a fluid-filled sac (bursa) in your hip.  Hip bursitis can be painful.  Symptoms often come and go over time.  This condition is treated with rest, ice, compression, elevation, and medicines. This information is not intended to replace advice given to you by your health care provider. Make sure you discuss any questions you have with your health care provider. Document Revised: 10/01/2017 Document Reviewed: 10/01/2017 Elsevier Patient Education  2020 [employer].

## 2019-05-15 NOTE — Progress Notes (Signed)
Patient ID: Sandy Salinas, female    DOB: 06-Mar-1950, 69 y.o.   MRN: [id number]  PCP: Delsa Grana, PA-C  Chief Complaint  Patient presents with  . Leg Pain    left from thigh down onset 1-2 months saw Dr. Meda Coffee rheamotologist and she did not do anything?    Subjective:   Sandy Salinas is a 69 y.o. female, presents to clinic with CC of the following:  She is overdue for routine follow-up and blood work, she scheduled this appointment today for a physical but she has multiple acute complaints and prefers to address that and reschedule at a later time for her physical and routine follow-up  Left leg, hip pain radiating to knee, started a month and a half ago Pain worse with palpation, no worse with rotation of the left leg/hip, ice and heat don't help, worse when she sleeps it worse, and sitting its worse with radiation to behind left knee. Pain is constant, fairly unchanged since onset, doesn't know what started it, no injury or strain.  She had chiropractor work on her low back pain over the past couple months that has gotten better and she doesn't feel like its radiating from back to leg         Patient Active Problem List   Diagnosis Date Noted  . Microalbuminuria due to type 2 diabetes mellitus ([place]) 02/20/2018  . Obesity (BMI 30.0-34.9) 02/19/2017  . Breast calcification seen on mammogram 12/01/2016  . Age-related osteoporosis without current pathological fracture 11/08/2016  . 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 Abdominal aortic ectasia ([place]) 05/19/2016  . 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 Xerosis of skin 12/13/2015  . Psoriatic arthritis ([place]) 11/18/2015  . Lumbar radiculopathy, acute 10/14/2015  . Vitamin D deficiency 08/18/2014  . Hyperlipidemia   . Hypertension   . Primary osteoarthritis of left knee   . Diabetes mellitus type 2 in obese ([place]) 07/24/2014  . Status post bariatric surgery 07/24/2014      Current  Outpatient Medications:  .  atorvastatin (LIPITOR) 80 MG tablet, TAKE 1 TABLET (80 MG) BY MOUTH AT BEDTIME, Disp: 90 tablet, Rfl: 1 .  Calcium Carbonate-Vitamin D (CALTRATE 600+D PO), Take by mouth daily., Disp: , Rfl:  .  chlorthalidone (HYGROTON) 50 MG tablet, Take 1 tablet (50 mg total) by mouth daily., Disp: 90 tablet, Rfl: 1 .  Cholecalciferol (VITAMIN D) 2000 units tablet, One by mouth three or four days per week, Disp: , Rfl:  .  gabapentin (NEURONTIN) 100 MG capsule, Take by mouth., Disp: , Rfl:  .  lisinopril (ZESTRIL) 10 MG tablet, Take 1 tablet (10 mg total) by mouth daily., Disp: 30 tablet, Rfl: 0 .  metFORMIN (GLUCOPHAGE) 500 MG tablet, Take 1 tablet (500 mg total) by mouth 2 (two) times daily., Disp: 180 tablet, Rfl: 1 .  Multiple Vitamin (MULTIVITAMIN) tablet, Take 1 tablet by mouth daily., Disp: , Rfl:  .  pioglitazone (ACTOS) 30 MG tablet, TAKE 1 TABLET(30 MG) BY MOUTH DAILY FOR DIABETES, Disp: 90 tablet, Rfl: 0 .  Potassium (GNP POTASSIUM) 99 MG TABS, Take by mouth., Disp: , Rfl:  .  TURMERIC PO, Take 1 capsule by mouth at bedtime., Disp: , Rfl:  .  vitamin B-12 (CYANOCOBALAMIN) 100 MCG tablet, Take by mouth daily., Disp: , Rfl:  .  vitamin E 100 UNIT capsule, Take by mouth daily. Reported on 08/19/2015, Disp: , Rfl:    Allergies  Allergen Reactions  . Codeine Nausea Only  .  Secukinumab Other (See Comments)    Made her throat raw for 2 months so is not taking this now     Family History  Problem Relation Age of Onset  . Alzheimer's disease Mother   . Heart murmur Mother   . Dementia Mother   . AAA (abdominal aortic aneurysm) Father   . Heart disease Father   . Asthma Sister   . Hypertension Sister   . Cancer Sister        breast  . Breast cancer Sister        late 18's  . Heart disease Brother   . Hypertension Brother   . Aneurysm Paternal Aunt   . Cancer Paternal Grandmother        possible, not sure what kind  . Lung cancer Paternal Uncle   . Cancer  Cousin      Social History   Socioeconomic History  . Marital status: Married    Spouse name: Loraine Leriche  . Number of children: 2  . Years of education: Not on file  . Highest education level: Some college, no degree  Occupational History  . Not on file  Tobacco Use  . Smoking status: Never Smoker  . Smokeless tobacco: Never Used  Substance and Sexual Activity  . Alcohol use: No  . Drug use: No  . Sexual activity: Not Currently  Other Topics Concern  . Not on file  Social History Narrative  . Not on file   Social Determinants of Health   Financial Resource Strain:   . Difficulty of Paying Living Expenses:   Food Insecurity:   . Worried About Programme researcher, broadcasting/film/video in the Last Year:   . Barista in the Last Year:   Transportation Needs:   . Freight forwarder (Medical):   Marland Kitchen Lack of Transportation (Non-Medical):   Physical Activity:   . Days of Exercise per Week:   . Minutes of Exercise per Session:   Stress:   . Feeling of Stress :   Social Connections:   . Frequency of Communication with Friends and Family:   . Frequency of Social Gatherings with Friends and Family:   . Attends Religious Services:   . Active Member of Clubs or Organizations:   . Attends Banker Meetings:   Marland Kitchen Marital Status:   Intimate Partner Violence:   . Fear of Current or Ex-Partner:   . Emotionally Abused:   Marland Kitchen Physically Abused:   . Sexually Abused:     Chart Review Today: I personally reviewed active problem list, medication list, allergies, family history, social history, health maintenance, notes from last encounter, lab results, imaging with the patient/caregiver today.   Review of Systems 10 Systems reviewed and are negative for acute change except as noted in the HPI.     Objective:   Vitals:   05/15/19 1043  BP: 116/62  Pulse: 77  Resp: 14  Temp: 97.9 F (36.6 C)  SpO2: 99%  Weight: 168 lb 3.2 oz (76.3 kg)  Height: 5\' 4"  (1.626 m)    Body mass index  is 28.87 kg/m.  Physical Exam Vitals and nursing note reviewed.  Constitutional:      General: She is not in acute distress.    Appearance: Normal appearance. She is well-developed. She is not ill-appearing, toxic-appearing or diaphoretic.  HENT:     Head: Normocephalic and atraumatic.  Eyes:     General:        Right eye: No  discharge.        Left eye: No discharge.     Conjunctiva/sclera: Conjunctivae normal.  Neck:     Trachea: No tracheal deviation.  Cardiovascular:     Rate and Rhythm: Normal rate and regular rhythm.     Pulses: Normal pulses.     Heart sounds: Normal heart sounds. No murmur. No friction rub. No gallop.   Pulmonary:     Effort: Pulmonary effort is normal. No respiratory distress.     Breath sounds: Normal breath sounds. No stridor.  Abdominal:     General: Bowel sounds are normal. There is no distension.     Tenderness: There is no abdominal tenderness.  Musculoskeletal:     Left knee: No swelling, deformity, effusion, erythema, bony tenderness or crepitus. Normal range of motion. No LCL laxity or MCL laxity.Normal alignment, normal meniscus and normal patellar mobility.     Right lower leg: No swelling. No edema.     Left lower leg: No swelling. No edema.     Comments: Left hip normal flexion extension, internal rotation and external rotation, no crepitus, mild discomfort with palpation over greater trochanter area no other bony tenderness to left hip   Skin:    General: Skin is warm and dry.     Findings: No erythema or rash.  Neurological:     Mental Status: She is alert.     Sensory: No sensory deficit.     Motor: No weakness or abnormal muscle tone.     Coordination: Coordination normal.     Gait: Gait normal.  Psychiatric:        Mood and Affect: Mood normal.        Behavior: Behavior normal.      Results for orders placed or performed in visit on 08/21/18  CBC with Differential  Result Value Ref Range   WBC 7.7 3.8 - 10.8 Thousand/uL    RBC 4.28 3.80 - 5.10 Million/uL   Hemoglobin 12.8 11.7 - 15.5 g/dL   HCT 94.8 01.6 - 55.3 %   MCV 89.7 80.0 - 100.0 fL   MCH 29.9 27.0 - 33.0 pg   MCHC 33.3 32.0 - 36.0 g/dL   RDW 74.8 27.0 - 78.6 %   Platelets 444 (H) 140 - 400 Thousand/uL   MPV 10.1 7.5 - 12.5 fL   Neutro Abs 3,696 1,500 - 7,800 cells/uL   Lymphs Abs 3,249 850 - 3,900 cells/uL   Absolute Monocytes 493 200 - 950 cells/uL   Eosinophils Absolute 200 15 - 500 cells/uL   Basophils Absolute 62 0 - 200 cells/uL   Neutrophils Relative % 48 %   Total Lymphocyte 42.2 %   Monocytes Relative 6.4 %   Eosinophils Relative 2.6 %   Basophils Relative 0.8 %  HgB A1c  Result Value Ref Range   Hgb A1c MFr Bld 7.2 (H) <5.7 % of total Hgb   Mean Plasma Glucose 160 (calc)   eAG (mmol/L) 8.9 (calc)  Lipid Profile  Result Value Ref Range   Cholesterol 106 <200 mg/dL   HDL 33 (L) > OR = 50 mg/dL   Triglycerides 754 <492 mg/dL   LDL Cholesterol (Calc) 54 mg/dL (calc)   Total CHOL/HDL Ratio 3.2 <5.0 (calc)   Non-HDL Cholesterol (Calc) 73 <010 mg/dL (calc)  COMPLETE METABOLIC PANEL WITH GFR  Result Value Ref Range   Glucose, Bld 149 (H) 65 - 99 mg/dL   BUN 18 7 - 25 mg/dL   Creat 0.71 2.19 -  0.99 mg/dL   GFR, Est Non African American 82 > OR = 60 mL/min/1.39m2   GFR, Est African American 96 > OR = 60 mL/min/1.60m2   BUN/Creatinine Ratio NOT APPLICABLE 6 - 22 (calc)   Sodium 137 135 - 146 mmol/L   Potassium 3.6 3.5 - 5.3 mmol/L   Chloride 99 98 - 110 mmol/L   CO2 27 20 - 32 mmol/L   Calcium 9.5 8.6 - 10.4 mg/dL   Total Protein 6.6 6.1 - 8.1 g/dL   Albumin 4.3 3.6 - 5.1 g/dL   Globulin 2.3 1.9 - 3.7 g/dL (calc)   AG Ratio 1.9 1.0 - 2.5 (calc)   Total Bilirubin 0.4 0.2 - 1.2 mg/dL   Alkaline phosphatase (APISO) 36 (L) 37 - 153 U/L   AST 18 10 - 35 U/L   ALT 9 6 - 29 U/L  Hepatitis C antibody  Result Value Ref Range   Hepatitis C Ab NON-REACTIVE NON-REACTI   SIGNAL TO CUT-OFF 0.01 <1.00        Assessment & Plan:       ICD-10-CM   1. Acute pain of left knee   Pain from left outer hip does seem to refer to her knee she does not have any focal knee pain elicited on exam no other abnormalities concerning for instability -pain may be from patellofemoral syndrome or something similar no effusion instability, erythema, crepitus good range of motion -patient does want to see orthopedic for further evaluation M25.562 Ambulatory referral to Orthopedic Surgery  2. Left hip pain  M25.552 Ambulatory referral to Orthopedic Surgery   suspect greater trochanter bursitis or pain syndrome, IT band syndrome?  She has good hip range of motion sensation and strength does have tenderness right over the greater trochanter area may need a injection we did discuss some IT band PT and I gave her a handout but when it severely inflamed she may want to wait until it calms down or she sees orthopedics for further evaluation.- refer to ortho  3. Mixed hyperlipidemia  E78.2 Lipid panel  4. Essential hypertension  I10 COMPLETE METABOLIC PANEL WITH GFR  5. Microalbuminuria due to type 2 diabetes mellitus ([facility])  E11.29 COMPLETE METABOLIC PANEL WITH GFR   R80.9 Hemoglobin A1c    Microalbumin, urine  6. Diabetes mellitus type 2 in obese ([facility])  E11.69 CBC with Differential/Platelet   E66.9 COMPLETE METABOLIC PANEL WITH GFR    Lipid panel    Hemoglobin A1c    Microalbumin, urine     Reviewed patient's medication list, past blood work really quick she is going to get her labs done today for her other chronic conditions since she has been requesting refills but she has been overdue for routine follow-up for the past 4 months.  She was asked to schedule her routine follow-up visit when she leaves today and we will review her labs and chronic conditions then. Return for in the next month needs virtual OV for HTN, HLD, DM, Medicare wellness with kasey.    Danelle Berry, PA-C 05/15/19 11:01 AM

## 2019-05-16 LAB — CBC WITH DIFFERENTIAL/PLATELET
Absolute Monocytes: 712 cells/uL (ref 200–950)
Basophils Absolute: 80 cells/uL (ref 0–200)
Basophils Relative: 0.9 %
Eosinophils Absolute: 160 cells/uL (ref 15–500)
Eosinophils Relative: 1.8 %
HCT: 41.2 % (ref 35.0–45.0)
Hemoglobin: 13.3 g/dL (ref 11.7–15.5)
Lymphs Abs: 3524 cells/uL (ref 850–3900)
MCH: 28.6 pg (ref 27.0–33.0)
MCHC: 32.3 g/dL (ref 32.0–36.0)
MCV: 88.6 fL (ref 80.0–100.0)
MPV: 9.6 fL (ref 7.5–12.5)
Monocytes Relative: 8 %
Neutro Abs: 4423 cells/uL (ref 1500–7800)
Neutrophils Relative %: 49.7 %
Platelets: 487 10*3/uL — ABNORMAL HIGH (ref 140–400)
RBC: 4.65 10*6/uL (ref 3.80–5.10)
RDW: 12.6 % (ref 11.0–15.0)
Total Lymphocyte: 39.6 %
WBC: 8.9 10*3/uL (ref 3.8–10.8)

## 2019-05-16 LAB — COMPLETE METABOLIC PANEL WITH GFR
AG Ratio: 1.5 (calc) (ref 1.0–2.5)
ALT: 32 U/L — ABNORMAL HIGH (ref 6–29)
AST: 53 U/L — ABNORMAL HIGH (ref 10–35)
Albumin: 4.5 g/dL (ref 3.6–5.1)
Alkaline phosphatase (APISO): 63 U/L (ref 37–153)
BUN: 18 mg/dL (ref 7–25)
CO2: 32 mmol/L (ref 20–32)
Calcium: 10 mg/dL (ref 8.6–10.4)
Chloride: 100 mmol/L (ref 98–110)
Creat: 0.74 mg/dL (ref 0.50–0.99)
GFR, Est African American: 96 mL/min/{1.73_m2} (ref >=60)
GFR, Est Non African American: 83 mL/min/{1.73_m2} (ref >=60)
Globulin: 3 g/dL (calc) (ref 1.9–3.7)
Glucose, Bld: 103 mg/dL — ABNORMAL HIGH (ref 65–99)
Potassium: 4.1 mmol/L (ref 3.5–5.3)
Sodium: 138 mmol/L (ref 135–146)
Total Bilirubin: 0.4 mg/dL (ref 0.2–1.2)
Total Protein: 7.5 g/dL (ref 6.1–8.1)

## 2019-05-16 LAB — LIPID PANEL
Cholesterol: 127 mg/dL (ref <200)
HDL: 37 mg/dL — ABNORMAL LOW (ref >=50)
LDL Cholesterol (Calc): 68 mg/dL (calc)
Non-HDL Cholesterol (Calc): 90 mg/dL (calc) (ref <130)
Total CHOL/HDL Ratio: 3.4 (calc) (ref <5.0)
Triglycerides: 139 mg/dL (ref <150)

## 2019-05-16 LAB — MICROALBUMIN, URINE: Microalb, Ur: <0.2 mg/dL

## 2019-05-16 LAB — HEMOGLOBIN A1C
Hgb A1c MFr Bld: 7.1 % of total Hgb — ABNORMAL HIGH (ref <5.7)
Mean Plasma Glucose: 157 (calc)
eAG (mmol/L): 8.7 (calc)

## 2019-05-19 DIAGNOSIS — M9903 Segmental and somatic dysfunction of lumbar region: Secondary | ICD-10-CM | POA: Diagnosis not present

## 2019-05-19 DIAGNOSIS — M4306 Spondylolysis, lumbar region: Secondary | ICD-10-CM | POA: Diagnosis not present

## 2019-05-19 DIAGNOSIS — M9905 Segmental and somatic dysfunction of pelvic region: Secondary | ICD-10-CM | POA: Diagnosis not present

## 2019-05-19 DIAGNOSIS — M955 Acquired deformity of pelvis: Secondary | ICD-10-CM | POA: Diagnosis not present

## 2019-05-20 DIAGNOSIS — S86912A Strain of unspecified muscle(s) and tendon(s) at lower leg level, left leg, initial encounter: Secondary | ICD-10-CM | POA: Diagnosis not present

## 2019-05-20 DIAGNOSIS — M1712 Unilateral primary osteoarthritis, left knee: Secondary | ICD-10-CM | POA: Diagnosis not present

## 2019-05-20 DIAGNOSIS — M7062 Trochanteric bursitis, left hip: Secondary | ICD-10-CM | POA: Insufficient documentation

## 2019-05-21 ENCOUNTER — Ambulatory Visit: Payer: Medicare Other

## 2019-05-28 ENCOUNTER — Ambulatory Visit
Admission: RE | Admit: 2019-05-28 | Discharge: 2019-05-28 | Disposition: A | Discharge status: Home / Self Care | Payer: Medicare Other | Source: Ambulatory Visit | Attending: Neurology | Admitting: Neurology

## 2019-05-28 ENCOUNTER — Other Ambulatory Visit: Payer: Self-pay

## 2019-05-28 DIAGNOSIS — R42 Dizziness and giddiness: Secondary | ICD-10-CM

## 2019-05-28 DIAGNOSIS — H9191 Unspecified hearing loss, right ear: Secondary | ICD-10-CM | POA: Diagnosis not present

## 2019-05-28 MED ORDER — GADOBENATE DIMEGLUMINE 529 MG/ML IV SOLN
15.0000 mL | Freq: Once | INTRAVENOUS | Status: AC | PRN
  Administered 2019-05-28: 15 mL via INTRAVENOUS

## 2019-05-30 ENCOUNTER — Encounter: Payer: Self-pay | Admitting: Physical Therapy

## 2019-05-30 ENCOUNTER — Ambulatory Visit: Payer: Medicare Other | Admitting: Physical Therapy

## 2019-05-30 ENCOUNTER — Other Ambulatory Visit: Payer: Self-pay

## 2019-05-30 DIAGNOSIS — R42 Dizziness and giddiness: Secondary | ICD-10-CM

## 2019-05-30 DIAGNOSIS — R2681 Unsteadiness on feet: Secondary | ICD-10-CM

## 2019-05-30 NOTE — Therapy (Signed)
[facility] [address] [place], [place], [address] Phone: [phone]   Fax:  [phone]  Physical Therapy Treatment  Patient Details  Name: Sandy Salinas MRN: [id number] Date of Birth: [birth year] Referring Provider (PT): Dr. Steele Sizer   Encounter Date: 05/30/2019  PT End of Session - 05/30/19 0949    Visit Number  2    Number of Visits  9    Date for PT Re-Evaluation  07/04/19    PT Start Time  0948    PT Stop Time  1029    PT Time Calculation (min)  41 min    Equipment Utilized During Treatment  Gait belt    Activity Tolerance  Patient tolerated treatment well    Behavior During Therapy  [facility] for tasks assessed/performed       Past Medical History:  Diagnosis Date  . 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 Breast calcification seen on mammogram 12/01/2016   LEFT  . Diabetes mellitus without complication (HCC)   . Family history of abdominal aortic aneurysm (AAA) 05/12/2016   father  . Hyperlipidemia   . Hypertension   . Obesity   . Osteoporosis   . Primary osteoarthritis of left knee   . Psoriasis, unspecified     Past Surgical History:  Procedure Laterality Date  . ABDOMINAL HYSTERECTOMY  1975   total  . BREAST BIOPSY Right 04/09/2012   x 2, FIBROADENOMA WITH COARSE CALCIFICATIONS.done by byrnett  . BREAST BIOPSY Right 09/04/2017   Affirm Bx- X clip path pending  . CYSTECTOMY  2003  . ROUX-EN-Y GASTRIC BYPASS  2009  . TONSILLECTOMY  1970    There were no vitals filed for this visit.  Subjective Assessment - 05/30/19 0950    Subjective  Patient reports she mowed and weeded her creek bed without dizziness issues this past week. Patient states she had a cortisone injection in left hip and knee and she states this has helped a lot. Patient also had brain MRI which she states was good. Patient states she has not had any dizziness this past week. Patient  states had been doing the VOR exercise in sitting and states she has not been having any dizziness or blurring.    Pertinent History  Patient was referred by Dr. Steele Sizer, neurologist. Patient reports she first started getting dizziness a few months ago. Patient reports the dizziness last minutes. Patient reports bending over and coming back up and turning bring on the dizziness and reports standing still makes it better. Patient reports her "balance has been horrible" and states she has veering when walking. Patient reports she has not fallen, but states she has come close. Patient reports that she has multiple episodes of reaching for support to regain her balance. Patient reports that she is concerned about her balance. Patient reports she is getting dizziness a few times a week. Patient reports some lightheadedness when she stand up after bending forward. Patient reports vertigo and right ear aural fullness. Patients symptoms are motion provoked, positional and intermittent in nature. Patient reports that she has tried to continue to be active and states she mowed 4 yards and weeded 3 yards last week.       Neuromuscular Re-education:  VOR x 1 exercise: Patient performed VOR X 1 horizontal in standing with feet together 2 reps of 1 minute on firm surface with verbal cues initially for technique.  Patient reports no dizziness symptoms with this exercise, but patient with mild sway noted. Added standing progression to HEP.  Airex pad:  On firm surface and then on Airex pad, patient performed feet together progressions and semi-tandem progressions with alternating lead leg with and without body turns and horizontal and vertical head turns with CGA.  Patient reports no dizziness this date. Patient demonstrates ankle and hip strategies and kept arms out away from body at waist level as strategy to help maintain balance. Added uneven surface progression and body turns to HEP.   Slow Marching: Patient  performed on firm surface slow marching with 5 second holds 2 sets of 10 reps.  Added this exercise to HEP.  Ball circles: Worked on standing with one foot on ball while doing clockwise and counterclockwise rolls multiple reps each foot without UEs support with CGA.   Ball toss over shoulder: Patient performed multiple 175' trials of forward ambulation while tossing ball over one shoulder with return catch over opposite shoulder with CGA.  Patient reports no dizziness but a few mild veering episodes noted.  Ambulation with head turns:  Patient performed 175' forwards and retro ambulation while scanning for targets in hallway with CGA.   Patient reports no dizziness with this activity.   Step Ups: Performed side stepping up onto 6" box, over and return 10 reps without UEs support with SBA. Patient had no loss of balance.     Patient denies dizziness during session. Patient challenged by narrow base of support and activities on Airex pad. In addition, patient had difficulty with performing 5 second holds with slow marching activity and patient would benefit from continued practice with progressions of single leg stance activities. Patient encouraged to follow-up as indicated to further address goals and balance deficits.    PT Education - 05/30/19 2042    Education Details  reviewed HEP and added standing on pillow progression to feet together and semi-tandem stance with head turns and added body turns as well as slow marching with 5 second holds.    Person(s) Educated  Patient    Methods  Explanation    Comprehension  Verbalized understanding       PT Short Term Goals - 05/09/19 1121      PT SHORT TERM GOAL #1   Title  Pt will be independent with HEP in order to improve balance and decrease dizziness symptoms in order to decrease fall risk and improve function at home and in the community.    Time  4    Period  Weeks    Status  New    Target Date  06/06/19        PT Long Term  Goals - 05/09/19 1121      PT LONG TERM GOAL #1   Title  Patient will reduce falls risk as indicated by Activities Specific Balance Confidence Scale (ABC) >67%.    Baseline  05/09/19 socred 52.5%    Time  8    Period  Weeks    Status  New    Target Date  07/04/19      PT LONG TERM GOAL #2   Title  Patient will report 50% or greater improvement in her symptoms of dizziness and imbalance with provoking motions or positions    Time  8    Period  Weeks    Status  New    Target Date  07/04/19      PT LONG TERM GOAL #3   Title  Patient will be able to ambulate 200' or greater without veering while being able to turn her head in order to improve her balance and decrease her falls risk.    Baseline  demonstrated veering with horizontal head turns on 05/09/19    Time  8    Period  Weeks    Status  New    Target Date  07/04/19            Plan - 05/30/19 2043    Clinical Impression Statement  Patient denies dizziness during session. Patient challenged by narrow base of support and activities on Airex pad. In addition, patient had difficulty with performing 5 second holds with slow marching activity and patient would benefit from continued practice with progressions of single leg stance activities. Patient encouraged to follow-up as indicated to further address goals and balance deficits.    Personal Factors and Comorbidities  Comorbidity 3+    Comorbidities  DM, HTN, hyperlipidemia    Examination-Activity Limitations  Bend    Stability/Clinical Decision Making  Evolving/Moderate complexity    Rehab Potential  Good    PT Frequency  1x / week    PT Duration  8 weeks    PT Treatment/Interventions  Canalith Repostioning;Neuromuscular re-education;Balance training;Gait training;Stair training;Therapeutic activities;Therapeutic exercise;Patient/family education;Vestibular    PT Next Visit Plan  review HEP and work on progressions; amb with head turns    PT Home Exercise Plan  VOR X 1 in  sitting, feet together and semi-tandem stance with horizontal and vertical head turns on firm surface    Consulted and Agree with Plan of Care  Patient       Patient will benefit from skilled therapeutic intervention in order to improve the following deficits and impairments:  Dizziness, Decreased balance, Difficulty walking  Visit Diagnosis: Dizziness and giddiness  Unsteadiness on feet     Problem List Patient Active Problem List   Diagnosis Date Noted  . Microalbuminuria due to type 2 diabetes mellitus ([place]) 02/20/2018  . Obesity (BMI 30.0-34.9) 02/19/2017  . Breast calcification seen on mammogram 12/01/2016  . Age-related osteoporosis without current pathological fracture 11/08/2016  . 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 Abdominal aortic ectasia ([place]) 05/19/2016  . 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 Xerosis of skin 12/13/2015  . Psoriatic arthritis ([place]) 11/18/2015  . Lumbar radiculopathy, acute 10/14/2015  . Vitamin D deficiency 08/18/2014  . Hyperlipidemia   . Hypertension   . Primary osteoarthritis of left knee   . Diabetes mellitus type 2 in obese ([place]) 07/24/2014  . Status post bariatric surgery 07/24/2014   Lady Deutscher PT, DPT [phone] Lady Deutscher 05/30/2019, 9:04 PM  [place] [address] [place], [place], [address] Phone: [phone]   Fax:  [phone]  Name: SHERELLE CASTELLI MRN: [id number] Date of Birth: 1950/04/27

## 2019-06-02 ENCOUNTER — Other Ambulatory Visit: Payer: Self-pay

## 2019-06-02 DIAGNOSIS — E11649 Type 2 diabetes mellitus with hypoglycemia without coma: Secondary | ICD-10-CM

## 2019-06-02 DIAGNOSIS — I10 Essential (primary) hypertension: Secondary | ICD-10-CM

## 2019-06-02 MED ORDER — LISINOPRIL 10 MG PO TABS: 10.0000 mg | ORAL_TABLET | Freq: Every day | ORAL | 0 refills | Status: DC

## 2019-06-09 ENCOUNTER — Telehealth: Payer: Self-pay | Admitting: Family Medicine

## 2019-06-09 NOTE — Chronic Care Management (AMB) (Signed)
  Chronic Care Management   Note  06/09/2019 Name: Sandy Salinas MRN: [id number] DOB: 08/29/1950  Sandy Salinas is a 69 y.o. year old female who is a primary care patient of Delsa Grana, [place]. I reached out to Sandy Salinas by phone today in response to a referral sent by [name]'s health plan.     Ms. Dacey was given information about Chronic Care Management services today including:  1. CCM service includes personalized support from designated clinical staff supervised by her physician, including individualized plan of care and coordination with other care providers 2. 24/7 contact phone numbers for assistance for urgent and routine care needs. 3. Service will only be billed when office clinical staff spend 20 minutes or more in a month to coordinate care. 4. Only one practitioner may furnish and bill the service in a calendar month. 5. The patient may stop CCM services at any time (effective at the end of the month) by phone call to the office staff. 6. The patient will be responsible for cost sharing (co-pay) of up to 20% of the service fee (after annual deductible is met).  Patient did not agree to enrollment in care management services and does not wish to consider at this time.  Follow up plan: The patient has been provided with contact information for the care management team and has been advised to call with any health related questions or concerns.   Noreene Larsson, [place], [place], [place] [address] Direct Dial: [phone] [email or web address] Website: [email or web address]

## 2019-06-11 ENCOUNTER — Ambulatory Visit: Payer: Medicare Other

## 2019-06-13 ENCOUNTER — Other Ambulatory Visit: Payer: Self-pay

## 2019-06-13 ENCOUNTER — Telehealth (INDEPENDENT_AMBULATORY_CARE_PROVIDER_SITE_OTHER): Payer: Medicare Other | Admitting: Family Medicine

## 2019-06-13 ENCOUNTER — Encounter: Payer: Self-pay | Admitting: Family Medicine

## 2019-06-13 VITALS — BP 123/70 | Ht 64.0 in | Wt 162.0 lb

## 2019-06-13 DIAGNOSIS — E669 Obesity, unspecified: Secondary | ICD-10-CM | POA: Diagnosis not present

## 2019-06-13 DIAGNOSIS — E1169 Type 2 diabetes mellitus with other specified complication: Secondary | ICD-10-CM | POA: Diagnosis not present

## 2019-06-13 DIAGNOSIS — R809 Proteinuria, unspecified: Secondary | ICD-10-CM | POA: Diagnosis not present

## 2019-06-13 DIAGNOSIS — R42 Dizziness and giddiness: Secondary | ICD-10-CM

## 2019-06-13 DIAGNOSIS — M1712 Unilateral primary osteoarthritis, left knee: Secondary | ICD-10-CM

## 2019-06-13 DIAGNOSIS — E1129 Type 2 diabetes mellitus with other diabetic kidney complication: Secondary | ICD-10-CM | POA: Diagnosis not present

## 2019-06-13 DIAGNOSIS — R7989 Other specified abnormal findings of blood chemistry: Secondary | ICD-10-CM

## 2019-06-13 DIAGNOSIS — E782 Mixed hyperlipidemia: Secondary | ICD-10-CM | POA: Diagnosis not present

## 2019-06-13 DIAGNOSIS — I10 Essential (primary) hypertension: Secondary | ICD-10-CM

## 2019-06-13 DIAGNOSIS — R269 Unspecified abnormalities of gait and mobility: Secondary | ICD-10-CM

## 2019-06-13 DIAGNOSIS — R350 Frequency of micturition: Secondary | ICD-10-CM | POA: Diagnosis not present

## 2019-06-13 DIAGNOSIS — M7062 Trochanteric bursitis, left hip: Secondary | ICD-10-CM | POA: Diagnosis not present

## 2019-06-13 NOTE — Progress Notes (Signed)
Name: Sandy Salinas   MRN: [id number]    DOB: Dec 22, 1950   Date:06/13/2019       Progress Note  Subjective:    I connected with  Sandy Salinas  on 06/13/19 at  9:40 AM EDT by a video enabled telemedicine application and verified that I am speaking with the correct person using two identifiers.  I discussed the limitations of evaluation and management by telemedicine and the availability of in person appointments. The patient expressed understanding and agreed to proceed. Staff also discussed with the patient that there may be a patient responsible charge related to this service. Patient Location:  Provider Location[facility]  Additional Individuals present: none  Chief Complaint  Patient presents with  . Follow-up  . Hyperlipidemia  . Diabetes  . Hypertension    Sandy Salinas is a 69 y.o. female, presents for virtual visit for routine follow up on the conditions listed above.  Pt was able to get into ortho for knee and hip and she got shots in both and her pain feels much better   Recent fall- she fell in her yard when doing yard work dragging limbs of tree to the curb and she was bent over and went to far and fell forward did a summersault, no injury, her vertigo balance and mobility are better and joint pain to knee and hpi are better, she would like to be done with PT   Hyperlipidemia: Current Medication Regimen:  Atorvastatin 80 mg, good compliance, no SE concerns or complaints Last Lipids: Lab Results  Component Value Date   CHOL 127 05/15/2019   HDL 37 (L) 05/15/2019   LDLCALC 68 05/15/2019   TRIG 139 05/15/2019   CHOLHDL 3.4 05/15/2019   - Denies: Chest pain, shortness of breath, myalgias. - Documented aortic atherosclerosis? No - Risk factors for atherosclerosis: diabetes mellitus, hypercholesterolemia and hypertension  Hypertension:  Currently managed on chlorthalidone 50 mg and lisinopril 10 mg Pt reports good med compliance and denies any SE.   Urinary frequency - urge and incontinence No lightheadedness, hypotension, syncope. Blood pressure today is well controlled. BP Readings from Last 3 Encounters:  06/13/19 123/70  05/15/19 116/62  11/05/18 125/69   Pt denies CP, SOB, exertional sx, LE edema, palpitation, Ha's, visual disturbances  Diabetes Mellitus Type II: Currently managing with metformin 500 mg BID Pt notes good  med compliance Pt has no SE from meds. Not checking cbgs No hypoglycemic episodes Denies: Polyuria, polydipsia, polyphagia, vision changes, or neuropathy Recent pertinent labs: Lab Results  Component Value Date   HGBA1C 7.1 (H) 05/15/2019   HGBA1C 7.2 (H) 08/26/2018   HGBA1C 7.3 (H) 02/20/2018    Pt I UTD on DM foot exam and due for eye exam ACEI/ARB: Yes Statin: Yes  Complains of some intermittent leg cramps left lower leg sometimes wakes her up at night, not daily and not with walking or exertion  LFTs - mildly elevated from baseline No tylenol, no ETOH -she denies any over-the-counter supplements or any other meds other than listed on her med list.  She denies any recent illness, no history of hepatitis or international travel or recent illness   Patient Active Problem List   Diagnosis Date Noted  . Microalbuminuria due to type 2 diabetes mellitus ([place]) 02/20/2018  . Obesity (BMI 30.0-34.9) 02/19/2017  . Breast calcification seen on mammogram 12/01/2016  . Age-related osteoporosis without current pathological fracture 11/08/2016  . 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 Abdominal aortic ectasia (HCC)  05/19/2016  . 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 Xerosis of skin 12/13/2015  . Psoriatic arthritis (HCC) 11/18/2015  . Lumbar radiculopathy, acute 10/14/2015  . Vitamin D deficiency 08/18/2014  . Hyperlipidemia   . Hypertension   . Primary osteoarthritis of left knee   . Diabetes mellitus type 2 in obese (HCC) 07/24/2014  . Status post bariatric surgery 07/24/2014      Current Outpatient Medications:  .  atorvastatin (LIPITOR) 80 MG tablet, TAKE 1 TABLET (80 MG) BY MOUTH AT BEDTIME, Disp: 90 tablet, Rfl: 1 .  Calcium Carbonate-Vitamin D (CALTRATE 600+D PO), Take by mouth daily., Disp: , Rfl:  .  chlorthalidone (HYGROTON) 50 MG tablet, Take 1 tablet (50 mg total) by mouth daily., Disp: 90 tablet, Rfl: 1 .  Cholecalciferol (VITAMIN D) 2000 units tablet, One by mouth three or four days per week, Disp: , Rfl:  .  gabapentin (NEURONTIN) 100 MG capsule, Take by mouth., Disp: , Rfl:  .  lisinopril (ZESTRIL) 10 MG tablet, Take 1 tablet (10 mg total) by mouth daily., Disp: 90 tablet, Rfl: 0 .  metFORMIN (GLUCOPHAGE) 500 MG tablet, Take 1 tablet (500 mg total) by mouth 2 (two) times daily., Disp: 180 tablet, Rfl: 1 .  Multiple Vitamin (MULTIVITAMIN) tablet, Take 1 tablet by mouth daily., Disp: , Rfl:  .  pioglitazone (ACTOS) 30 MG tablet, TAKE 1 TABLET(30 MG) BY MOUTH DAILY FOR DIABETES, Disp: 90 tablet, Rfl: 0 .  Potassium (GNP POTASSIUM) 99 MG TABS, Take by mouth., Disp: , Rfl:  .  TURMERIC PO, Take 1 capsule by mouth at bedtime., Disp: , Rfl:  .  vitamin B-12 (CYANOCOBALAMIN) 100 MCG tablet, Take by mouth daily., Disp: , Rfl:  .  vitamin E 100 UNIT capsule, Take by mouth daily. Reported on 08/19/2015, Disp: , Rfl:  Allergies  Allergen Reactions  . Codeine Nausea Only  . Secukinumab Other (See Comments)    Made her throat raw for 2 months so is not taking this now    Past Surgical History:  Procedure Laterality Date  . ABDOMINAL HYSTERECTOMY  1975   total  . BREAST BIOPSY Right 04/09/2012   x 2, FIBROADENOMA WITH COARSE CALCIFICATIONS.done by byrnett  . BREAST BIOPSY Right 09/04/2017   Affirm Bx- X clip path pending  . CYSTECTOMY  2003  . ROUX-EN-Y GASTRIC BYPASS  2009  . TONSILLECTOMY  1970   Family History  Problem Relation Age of Onset  . Alzheimer's disease Mother   . Heart murmur Mother   . Dementia Mother   . AAA (abdominal aortic aneurysm)  Father   . Heart disease Father   . Asthma Sister   . Hypertension Sister   . Cancer Sister        breast  . Breast cancer Sister        late 86's  . Heart disease Brother   . Hypertension Brother   . Aneurysm Paternal Aunt   . Cancer Paternal Grandmother        possible, not sure what kind  . Lung cancer Paternal Uncle   . Cancer Cousin    Social History   Socioeconomic History  . Marital status: Married    Spouse name: Loraine Leriche  . Number of children: 2  . Years of education: Not on file  . Highest education level: Some college, no degree  Occupational History  . Not on file  Tobacco Use  . Smoking status: Never Smoker  . Smokeless tobacco: Never Used  Substance and Sexual Activity  . Alcohol use: No  . Drug use: No  . Sexual activity: Not Currently  Other Topics Concern  . Not on file  Social History Narrative  . Not on file   Social Determinants of Health   Financial Resource Strain:   . Difficulty of Paying Living Expenses:   Food Insecurity:   . Worried About Programme researcher, broadcasting/film/video in the Last Year:   . Barista in the Last Year:   Transportation Needs:   . Freight forwarder (Medical):   Marland Kitchen Lack of Transportation (Non-Medical):   Physical Activity:   . Days of Exercise per Week:   . Minutes of Exercise per Session:   Stress:   . Feeling of Stress :   Social Connections:   . Frequency of Communication with Friends and Family:   . Frequency of Social Gatherings with Friends and Family:   . Attends Religious Services:   . Active Member of Clubs or Organizations:   . Attends Banker Meetings:   Marland Kitchen Marital Status:   Intimate Partner Violence:   . Fear of Current or Ex-Partner:   . Emotionally Abused:   Marland Kitchen Physically Abused:   . Sexually Abused:     Chart Review Today: I personally reviewed active problem list, medication list, allergies, family history, social history, health maintenance, notes from last encounter, lab results,  imaging with the patient/caregiver today.   Review of Systems  10 Systems reviewed and are negative for acute change except as noted in the HPI.   Objective:    Virtual encounter, vitals limited, only able to obtain the following Today's Vitals   06/13/19 0950  BP: 123/70  Weight: 162 lb (73.5 kg)  Height: 5\' 4"  (1.626 m)   Body mass index is 27.81 kg/m. Nursing Note and Vital Signs reviewed.  Physical Exam Vitals and nursing note reviewed.  Constitutional:      General: She is not in acute distress.    Appearance: Normal appearance.  Pulmonary:     Effort: No respiratory distress.  Neurological:     Mental Status: She is alert.  Psychiatric:        Mood and Affect: Mood normal.        Behavior: Behavior normal.     PE limited by telephone encounter  No results found for this or any previous visit (from the past 72 hour(s)).  PHQ2/9: Depression screen [facility] 2/9 06/13/2019 05/15/2019 11/05/2018 08/21/2018 05/27/2018  Decreased Interest 0 0 0 0 0  Down, Depressed, Hopeless 0 0 0 0 0  PHQ - 2 Score 0 0 0 0 0  Altered sleeping 0 0 0 0 0  Tired, decreased energy 0 0 0 0 0  Change in appetite 0 0 0 0 0  Feeling bad or failure about yourself  0 0 0 0 0  Trouble concentrating 0 0 0 0 0  Moving slowly or fidgety/restless 0 0 0 0 0  Suicidal thoughts 0 0 0 0 0  PHQ-9 Score 0 0 0 0 0  Difficult doing work/chores Not difficult at all Not difficult at all Not difficult at all Not difficult at all Not difficult at all  Some recent data might be hidden   PHQ-2/9 Result is neg, reviewed  Fall Risk: Fall Risk  06/13/2019 05/15/2019 11/05/2018 08/21/2018 05/27/2018  Falls in the past year? 1 0 0 1 1  Number falls in past yr: 1 0 0 1 1  Injury with Fall? 0 0 0 0 1     Assessment and Plan:     ICD-10-CM   1. Diabetes mellitus type 2 in obese (HCC)  E11.69    E66.9    7.1 A1C - very close to being controlled, keep meds the same, increased effort in diet/exercise -cut out some  carbs/sweets  2. Microalbuminuria due to type 2 diabetes mellitus (HCC)  E11.29    R80.9    see #1  3. Primary osteoarthritis of left knee  M17.12    improving after seeing ortho  4. Mixed hyperlipidemia  E78.2    stable, well controlled, compliant with statin, recent lipid reviewed  5. Essential hypertension  I10    well controlled, urinary sx, decrease chlorthalidone to 25 mg a day and continue lisinopril 10 mg daily   6. Elevated LFTs  R79.89    new, no etoh, no tylenol, no recent illness - will recheck in the next 1-2 months, have pt f/up for further eval if still elevated  7. Urinary frequency  R35.0    work on lifestyle changes/modification, see handout, and f/up in clinic, possible OAB, pelvic floor dysfunction?    8. Vertigo  R42    sx resolved after seeing PT  9. Gait abnormality  R26.9    improved with seeing ortho and PT  10. Trochanteric bursitis of left hip  M70.62    improved after ortho injection    I discussed the assessment and treatment plan with the patient. The patient was provided an opportunity to ask questions and all were answered. The patient agreed with the plan and demonstrated an understanding of the instructions.  The patient was advised to call back or seek an in-person evaluation if the symptoms worsen or if the condition fails to improve as anticipated.  I provided 30+ minutes of non-face-to-face time during this encounter.  Danelle Berry, PA-C 06/13/19 10:05 AM

## 2019-06-13 NOTE — Patient Instructions (Signed)
Cut back on chlorthalidone to 25 mg daily and take lisinopril 10 mg daily - montior your blood pressure and urinary symptoms, get a follow up appointment in 3 months to recheck.  You have mildly elevated liver enzymes - if they do not return to normal we will need to work that up to figure out why they are elevated.  Get labs done in the next month to recheck   Urinary Frequency, Adult Urinary frequency means urinating more often than usual. You may urinate every 1-2 hours even though you drink a normal amount of fluid and do not have a bladder infection or condition. Although you urinate more often than normal, the total amount of urine produced in a day is normal. With urinary frequency, you may have an urgent need to urinate often. The stress and anxiety of needing to find a bathroom quickly can make this urge worse. This condition may go away on its own or you may need treatment at home. Home treatment may include bladder training, exercises, taking medicines, or making changes to your diet. Follow these instructions at home: Bladder health   Keep a bladder diary if told by your health care provider. Keep track of: ? What you eat and drink. ? How often you urinate. ? How much you urinate.  Follow a bladder training program if told by your health care provider. This may include: ? Learning to delay going to the bathroom. ? Double urinating (voiding). This helps if you are not completely emptying your bladder. ? Scheduled voiding.  Do Kegel exercises as told by your health care provider. Kegel exercises strengthen the muscles that help control urination, which may help the condition. Eating and drinking  If told by your health care provider, make diet changes, such as: ? Avoiding caffeine. ? Drinking fewer fluids, especially alcohol. ? Not drinking in the evening. ? Avoiding foods or drinks that may irritate the bladder. These include coffee, tea, soda, artificial sweeteners, citrus,  tomato-based foods, and chocolate. ? Eating foods that help prevent or ease constipation. Constipation can make this condition worse. Your health care provider may recommend that you:  Drink enough fluid to keep your urine pale yellow.  Take over-the-counter or prescription medicines.  Eat foods that are high in fiber, such as beans, whole grains, and fresh fruits and vegetables.  Limit foods that are high in fat and processed sugars, such as fried or sweet foods. General instructions  Take over-the-counter and prescription medicines only as told by your health care provider.  Keep all follow-up visits as told by your health care provider. This is important. Contact a health care provider if:  You start urinating more often.  You feel pain or irritation when you urinate.  You notice blood in your urine.  Your urine looks cloudy.  You develop a fever.  You begin vomiting. Get help right away if:  You are unable to urinate. Summary  Urinary frequency means urinating more often than usual. With urinary frequency, you may urinate every 1-2 hours even though you drink a normal amount of fluid and do not have a bladder infection or other bladder condition.  Your health care provider may recommend that you keep a bladder diary, follow a bladder training program, or make dietary changes.  If told by your health care provider, do Kegel exercises to strengthen the muscles that help control urination.  Take over-the-counter and prescription medicines only as told by your health care provider.  Contact a health care  provider if your symptoms do not improve or get worse. This information is not intended to replace advice given to you by your health care provider. Make sure you discuss any questions you have with your health care provider. Document Revised: 08/02/2017 Document Reviewed: 08/02/2017 Elsevier Patient Education  [place].

## 2019-07-01 DIAGNOSIS — M1712 Unilateral primary osteoarthritis, left knee: Secondary | ICD-10-CM | POA: Diagnosis not present

## 2019-07-15 DIAGNOSIS — M1712 Unilateral primary osteoarthritis, left knee: Secondary | ICD-10-CM | POA: Diagnosis not present

## 2019-07-20 ENCOUNTER — Other Ambulatory Visit: Payer: Self-pay | Admitting: Family Medicine

## 2019-07-20 DIAGNOSIS — E11649 Type 2 diabetes mellitus with hypoglycemia without coma: Secondary | ICD-10-CM

## 2019-07-20 DIAGNOSIS — I10 Essential (primary) hypertension: Secondary | ICD-10-CM

## 2019-07-31 DIAGNOSIS — E119 Type 2 diabetes mellitus without complications: Secondary | ICD-10-CM | POA: Diagnosis not present

## 2019-08-19 ENCOUNTER — Other Ambulatory Visit: Payer: Self-pay

## 2019-08-19 ENCOUNTER — Ambulatory Visit (INDEPENDENT_AMBULATORY_CARE_PROVIDER_SITE_OTHER): Payer: Medicare Other

## 2019-08-19 VITALS — BP 148/86 | HR 78 | Temp 97.3°F | Resp 16 | Ht 64.0 in | Wt 167.8 lb

## 2019-08-19 DIAGNOSIS — Z1231 Encounter for screening mammogram for malignant neoplasm of breast: Secondary | ICD-10-CM

## 2019-08-19 DIAGNOSIS — Z0000 Encounter for general adult medical examination without abnormal findings: Secondary | ICD-10-CM | POA: Diagnosis not present

## 2019-08-19 NOTE — Progress Notes (Addendum)
Subjective:   Sandy Salinas is a 69 y.o. female who presents for Medicare Annual (Subsequent) preventive examination.  Review of Systems     Cardiac Risk Factors include: advanced age (>66men, >69 women);dyslipidemia;hypertension;diabetes mellitus     Objective:    Today's Vitals   08/19/19 1458  BP: (!) 148/86  Pulse: 78  Resp: 16  Temp: (!) 97.3 F (36.3 C)  TempSrc: Temporal  SpO2: 100%  Weight: 167 lb 12.8 oz (76.1 kg)  Height: 5\' 4"  (1.626 m)   Body mass index is 28.8 kg/m.  Advanced Directives 08/19/2019 11/14/2016 09/11/2016 06/26/2016 06/16/2016 06/12/2016 06/06/2016  Does Patient Have a Medical Advance Directive? No Yes No No;Yes Yes Yes Yes  Type of Advance Directive - - 08/06/2016;Living will Healthcare Power of [place];Living will - -  Does patient want to make changes to medical advance directive? - - - No - Patient declined - - -  Copy of Healthcare Power of Attorney in Chart? - - - No - copy requested - - -  Would patient like information on creating a medical advance directive? Yes (MAU/Ambulatory/Procedural Areas - Information given) - - No - Patient declined - - -    Current Medications (verified) Outpatient Encounter Medications as of 08/19/2019  Medication Sig  . atorvastatin (LIPITOR) 80 MG tablet TAKE 1 TABLET (80 MG) BY MOUTH AT BEDTIME  . Calcium Carbonate-Vitamin D (CALTRATE 600+D PO) Take by mouth daily.  . Cholecalciferol (VITAMIN D) 2000 units tablet One by mouth three or four days per week  . gabapentin (NEURONTIN) 100 MG capsule Take by mouth.  08/21/2019 lisinopril (ZESTRIL) 10 MG tablet Take 1 tablet (10 mg total) by mouth daily.  . Magnesium 100 MG TABS Take by mouth.  . metFORMIN (GLUCOPHAGE) 500 MG tablet Take 1 tablet (500 mg total) by mouth 2 (two) times daily.  . Multiple Vitamin (MULTIVITAMIN) tablet Take 1 tablet by mouth daily.  . Potassium (GNP POTASSIUM) 99 MG TABS Take by mouth.  . TURMERIC PO Take 1 capsule by mouth at  bedtime.  . vitamin B-12 (CYANOCOBALAMIN) 100 MCG tablet Take by mouth daily.  . vitamin E 100 UNIT capsule Take by mouth daily. Reported on 08/19/2015  . chlorthalidone (HYGROTON) 50 MG tablet Take 1 tablet (50 mg total) by mouth daily. (Patient not taking: Reported on 08/19/2019)   No facility-administered encounter medications on file as of 08/19/2019.    Allergies (verified) Codeine and Secukinumab   History: Past Medical History:  Diagnosis Date  . Abdominal aortic ectasia (HCC) 05/19/2016   05/21/2016 April 2018, rescan in five years (April 2023)  . Breast calcification seen on mammogram 12/01/2016   LEFT  . Diabetes mellitus without complication (HCC)   . Family history of abdominal aortic aneurysm (AAA) 05/12/2016   father  . Hyperlipidemia   . Hypertension   . Obesity   . Osteoporosis   . Primary osteoarthritis of left knee   . Psoriasis, unspecified    Past Surgical History:  Procedure Laterality Date  . ABDOMINAL HYSTERECTOMY  1975   total  . BREAST BIOPSY Right 04/09/2012   x 2, FIBROADENOMA WITH COARSE CALCIFICATIONS.done by byrnett  . BREAST BIOPSY Right 09/04/2017   Affirm Bx- X clip path pending  . CYSTECTOMY  2003  . ROUX-EN-Y GASTRIC BYPASS  2009  . TONSILLECTOMY  1970   Family History  Problem Relation Age of Onset  . Alzheimer's disease Mother   . Heart murmur Mother   .  Dementia Mother   . AAA (abdominal aortic aneurysm) Father   . Heart disease Father   . Asthma Sister   . Hypertension Sister   . Cancer Sister        breast  . Breast cancer Sister        late 72's  . Heart disease Brother   . Hypertension Brother   . Aneurysm Paternal Aunt   . Cancer Paternal Grandmother        possible, not sure what kind  . Lung cancer Paternal Uncle   . Cancer Cousin    Social History   Socioeconomic History  . Marital status: Married    Spouse name: Loraine Leriche  . Number of children: 2  . Years of education: Not on file  . Highest education level: Some college,  no degree  Occupational History  . Occupation: retired  Tobacco Use  . Smoking status: Never Smoker  . Smokeless tobacco: Never Used  Vaping Use  . Vaping Use: Never used  Substance and Sexual Activity  . Alcohol use: No  . Drug use: No  . Sexual activity: Not Currently  Other Topics Concern  . 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    Social Determinants of Health   Financial Resource Strain: Low Risk   . Difficulty of Paying Living Expenses: Not very hard  Food Insecurity: No Food Insecurity  . Worried About Programme researcher, broadcasting/film/video in the Last Year: Never true  . Ran Out of Food in the Last Year: Never true  Transportation Needs: No Transportation Needs  . Lack of Transportation (Medical): No  . Lack of Transportation (Non-Medical): No  Physical Activity: Inactive  . Days of Exercise per Week: 0 days  . Minutes of Exercise per Session: 0 min  Stress: No Stress Concern Present  . Feeling of Stress : Not at all  Social Connections: Moderately Isolated  . Frequency of Communication with Friends and Family: More than three times a week  . Frequency of Social Gatherings with Friends and Family: More than three times a week  . Attends Religious Services: Never  . Active Member of Clubs or Organizations: No  . Attends Banker Meetings: Never  . Marital Status: Married    Tobacco Counseling Counseling given: Not Answered   Clinical Intake:  Pre-visit preparation completed: Yes  Pain : No/denies pain     BMI - recorded: 28.8 Nutritional Risks: None Diabetes: Yes CBG done?: No Did pt. bring in CBG monitor from home?: No  How often do you need to have someone help you when you read instructions, pamphlets, or other written materials from your doctor or pharmacy?: 1 - Never  Nutrition Risk Assessment:  Has the patient had any N/V/D within the last 2 months?  No  Does the patient have any non-healing  wounds?  No  Has the patient had any unintentional weight loss or weight gain?  No   Diabetes:  Is the patient diabetic?  Yes  If diabetic, was a CBG obtained today?  No  Did the patient bring in their glucometer from home?  No  How often do you monitor your CBG's? Twice daily .   Financial Strains and Diabetes Management:  Are you having any financial strains with the device, your supplies or your medication? No .  Does the patient want to be seen by Chronic Care Management for management of their diabetes?  No  Would the patient like to be referred to a Nutritionist or for Diabetic Management?  No   Diabetic Exams:  Diabetic Eye Exam: Completed per patient, need records from [facility].  Diabetic Foot Exam: Completed 05/15/19.    Interpreter Needed?: No  Information entered by :: Reather Littler LPN   Activities of Daily Living In your present state of health, do you have any difficulty performing the following activities: 08/19/2019 06/13/2019  Hearing? Y Y  Comment declines hearing aids -  Vision? N N  Difficulty concentrating or making decisions? N N  Walking or climbing stairs? N N  Dressing or bathing? N N  Doing errands, shopping? N N  Using the Toilet? N -  In the past six months, have you accidently leaked urine? N -  Do you have problems with loss of bowel control? N -  Managing your Medications? N -  Managing your Finances? N -  Housekeeping or managing your Housekeeping? N -  Some recent data might be hidden    Patient Care Team: Danelle Berry, PA-C as PCP - General (Family Medicine) Everette Rank, MD (Bariatrics) Sherle Poe, Vashti Hey, MD (Dermatology)  Indicate any recent Medical Services you may have received from other than [facility] providers in the past year (date may be approximate).     Assessment:   This is a routine wellness examination for [place].  Hearing/Vision screen  Hearing Screening              Right ear:           Left ear:           Comments: Pt c/o mild hearing difficulty; declines hearing aids   Vision Screening Comments: [facility] screenings [facility] Dr. Melanie Crazier  Dietary issues and exercise activities discussed: Current Exercise Habits: Home exercise routine, Type of exercise: Other - see comments (house and yard work), Intensity: Moderate, Exercise limited by: None identified  Goals    . Weight (lb) < 170 lb (77.1 kg)      Depression Screen PHQ 2/9 Scores 08/19/2019 06/13/2019 05/15/2019 11/05/2018 08/21/2018 05/27/2018 05/21/2018  PHQ - 2 Score 0 0 0 0 0 0 0  PHQ- 9 Score - 0 0 0 0 0 0    Fall Risk Fall Risk  08/19/2019 06/13/2019 06/13/2019 05/15/2019 11/05/2018  Falls in the past year? 0 1 1 0 0  Number falls in past yr: 0 0 1 0 0  Injury with Fall? 0 0 0 0 0  Risk for fall due to : No Fall Risks History of fall(s) - - -  Follow up Falls prevention discussed Falls evaluation completed;Education provided - - -    Any stairs in or around the home? Yes  If so, are there any without handrails? Yes  Home free of loose throw rugs in walkways, pet beds, electrical cords, etc? Yes  Adequate lighting in your home to reduce risk of falls? Yes   ASSISTIVE DEVICES UTILIZED TO PREVENT FALLS:  Life alert? No  Use of a cane, walker or w/c? No  Grab bars in the bathroom? Yes  Shower chair or bench in shower? No  Elevated toilet seat or a handicapped toilet? Yes   TIMED UP AND GO:  Was the test performed? Yes .  Length of time to ambulate 10 feet: 5 sec.   Gait steady and fast without use of assistive device  Cognitive Function:     6CIT Screen 08/19/2019 05/15/2017  What Year? 0 points 0 points  What month? 0 points 0 points  What time? 0 points 0 points  Count back from 20 0 points 0 points  Months in reverse 0 points 0 points  Repeat phrase 0 points 0 points  Total Score 0 0    Immunizations Immunization History  Administered  Date(s) Administered  . Influenza, High Dose Seasonal PF 11/14/2016, 09/24/2018  . Influenza, Seasonal, Injecte, Preservative Fre 01/10/2007  . Influenza,inj,Quad PF,6+ Mos 10/14/2015  . Influenza-Unspecified 10/15/2013, 02/07/2015  . PFIZER SARS-COV-2 Vaccination 02/27/2019, 03/20/2019  . Pneumococcal Conjugate-13 11/14/2016  . Pneumococcal Polysaccharide-23 11/20/2017  . Td 02/07/2004  . Tdap 04/14/2016    TDAP status: Up to date   Flu Vaccine status: Up to date   Pneumococcal vaccine status: Up to date   Covid-19 vaccine status: Completed vaccines  Qualifies for Shingles Vaccine? Yes   Zostavax completed No   Shingrix Completed?: No.    Education has been provided regarding the importance of this vaccine. Patient has been advised to call insurance company to determine out of pocket expense if they have not yet received this vaccine. Advised may also receive vaccine at local pharmacy or Health Dept. Verbalized acceptance and understanding.  Screening Tests Health Maintenance  Topic Date Due  . OPHTHALMOLOGY EXAM  02/13/2018  . MAMMOGRAM  09/02/2019  . INFLUENZA VACCINE  09/07/2019  . HEMOGLOBIN A1C  11/14/2019  . FOOT EXAM  05/14/2020  . Fecal DNA (Cologuard)  06/14/2020  . TETANUS/TDAP  04/15/2026  . DEXA SCAN  Completed  . COVID-19 Vaccine  Completed  . Hepatitis C Screening  Completed  . PNA vac Low Risk Adult  Completed    Health Maintenance  Health Maintenance Due  Topic Date Due  . OPHTHALMOLOGY EXAM  02/13/2018   Colorectal cancer screening status: Cologuard completed 06/14/17; repeat in 2022.   Mammogram status: Completed 09/02/18. Repeat every year. Ordered today.   Bone Density status: Completed 09/12/18. Results reflect: Bone density results: OSTEOPENIA. Repeat every 2 years.  Lung Cancer Screening: (Low Dose CT Chest recommended if Age 35-80 years, 30 pack-year currently smoking OR have quit w/in 15years.) does not qualify.   Additional  Screening:  Hepatitis C Screening: does qualify; Completed 08/27/18  Vision Screening: Recommended annual ophthalmology exams for early detection of glaucoma and other disorders of the eye. Is the patient up to date with their annual eye exam?  Yes  Who is the provider or what is the name of the office in which the patient attends annual eye exams? [facility]  Dental Screening: Recommended annual dental exams for proper oral hygiene  Community Resource Referral / Chronic Care Management: CRR required this visit?  No   CCM required this visit?  No      Plan:     I have personally reviewed and noted the following in the patient's chart:   . Medical and social history . Use of alcohol, tobacco or illicit drugs  . Current medications and supplements . Functional ability and status . Nutritional status . Physical activity . Advanced directives . List of other physicians . Hospitalizations, surgeries, and ER visits in previous 12 months . Vitals . Screenings to include cognitive, depression, and falls . Referrals and appointments  In addition, I have reviewed and discussed with patient certain preventive protocols, quality metrics, and best practice recommendations. A written personalized care plan for preventive services as well as general preventive health recommendations were provided to patient.  Reather LittlerKasey Jacquiline Zurcher, LPN   [id number]/61/09607/13/2021   Nurse Notes: pt doing well and appreciative of visit today. Patient states she has not been taking her chlorthalidone for the past week. BP slightly elevated today at 148/86. Per MyChart video visit notes instructions to decrease chlorthalidone to 25 mg daily and continue lisinopril 10 mg due to pt c/o urinary frequency and urge incontinence. Pt denies fluid retention or swelling.   Please send new [facility] due to cost of 3 month supply of 50 mg dose $59. Per pharmacist the 50 mg is more expensive than  the 25 mg which is preferred.   Patient also needs rx for Accu Chek Guide Me strips and Accu Chek Softclix lancets with testing directions. She has been checking blood sugars BID and buying OTC strips from [facility]. Patient given sample meter today.   Patient advised to schedule 3 month [facility] per last note.

## 2019-08-19 NOTE — Patient Instructions (Signed)
Sandy Salinas , Thank you for taking time to come for your Medicare Wellness Visit. I appreciate your ongoing commitment to your health goals. Please review the following plan we discussed and let me know if I can assist you in the future.   Screening recommendations/referrals: Colonoscopy: Cologuard completed 06/14/17. Repeat in 2022. Mammogram: done 09/02/18. Please call [phone] to schedule your mammogram.  Bone Density: done 09/12/18 Recommended yearly ophthalmology/optometry visit for glaucoma screening and checkup Recommended yearly dental visit for hygiene and checkup  Vaccinations: Influenza vaccine: done 09/24/18 Pneumococcal vaccine: done 11/20/17 Tdap vaccine: done 04/14/16 Shingles vaccine: Shingrix discussed. Please contact your pharmacy for coverage information.  Covid-19: done 02/27/19 & 03/20/19  Advanced directives: Advance directive discussed with you today. I have provided a copy for you to complete at home and have notarized. Once this is complete please bring a copy in to our office so we can scan it into your chart.  Conditions/risks identified: Keep up the great work!  Next appointment: Follow up in one year for your annual wellness visit    Preventive Care 65 Years and Older, Female Preventive care refers to lifestyle choices and visits with your health care provider that can promote health and wellness. What does preventive care include?  A yearly physical exam. This is also called an annual well check.  Dental exams once or twice a year.  Routine eye exams. Ask your health care provider how often you should have your eyes checked.  Personal lifestyle choices, including:  Daily care of your teeth and gums.  Regular physical activity.  Eating a healthy diet.  Avoiding tobacco and drug use.  Limiting alcohol use.  Practicing safe sex.  Taking low-dose aspirin every day.  Taking vitamin and mineral supplements as recommended by your health care  provider. What happens during an annual well check? The services and screenings done by your health care provider during your annual well check will depend on your age, overall health, lifestyle risk factors, and family history of disease. Counseling  Your health care provider may ask you questions about your:  Alcohol use.  Tobacco use.  Drug use.  Emotional well-being.  Home and relationship well-being.  Sexual activity.  Eating habits.  History of falls.  Memory and ability to understand (cognition).  Work and work Astronomer.  Reproductive health. Screening  You may have the following tests or measurements:  Height, weight, and BMI.  Blood pressure.  Lipid and cholesterol levels. These may be checked every 5 years, or more frequently if you are over 65 years old.  Skin check.  Lung cancer screening. You may have this screening every year starting at age 109 if you have a 30-pack-year history of smoking and currently smoke or have quit within the past 15 years.  Fecal occult blood test (FOBT) of the stool. You may have this test every year starting at age 6.  Flexible sigmoidoscopy or colonoscopy. You may have a sigmoidoscopy every 5 years or a colonoscopy every 10 years starting at age 83.  Hepatitis C blood test.  Hepatitis B blood test.  Sexually transmitted disease (STD) testing.  Diabetes screening. This is done by checking your blood sugar (glucose) after you have not eaten for a while (fasting). You may have this done every 1-3 years.  Bone density scan. This is done to screen for osteoporosis. You may have this done starting at age 64.  Mammogram. This may be done every 1-2 years. Talk to your health care provider  about how often you should have regular mammograms. Talk with your health care provider about your test results, treatment options, and if necessary, the need for more tests. Vaccines  Your health care provider may recommend certain  vaccines, such as:  Influenza vaccine. This is recommended every year.  Tetanus, diphtheria, and acellular pertussis (Tdap, Td) vaccine. You may need a Td booster every 10 years.  Zoster vaccine. You may need this after age 46.  Pneumococcal 13-valent conjugate (PCV13) vaccine. One dose is recommended after age 37.  Pneumococcal polysaccharide (PPSV23) vaccine. One dose is recommended after age 69. Talk to your health care provider about which screenings and vaccines you need and how often you need them. This information is not intended to replace advice given to you by your health care provider. Make sure you discuss any questions you have with your health care provider. Document Released: 02/19/2015 Document Revised: 10/13/2015 Document Reviewed: 11/24/2014 Elsevier Interactive Patient Education  2017 [employer] Prevention in the Home Falls can cause injuries. They can happen to people of all ages. There are many things you can do to make your home safe and to help prevent falls. What can I do on the outside of my home?  Regularly fix the edges of walkways and driveways and fix any cracks.  Remove anything that might make you trip as you walk through a door, such as a raised step or threshold.  Trim any bushes or trees on the path to your home.  Use bright outdoor lighting.  Clear any walking paths of anything that might make someone trip, such as rocks or tools.  Regularly check to see if handrails are loose or broken. Make sure that both sides of any steps have handrails.  Any raised decks and porches should have guardrails on the edges.  Have any leaves, snow, or ice cleared regularly.  Use sand or salt on walking paths during winter.  Clean up any spills in your garage right away. This includes oil or grease spills. What can I do in the bathroom?  Use night lights.  Install grab bars by the toilet and in the tub and shower. Do not use towel bars as grab  bars.  Use non-skid mats or decals in the tub or shower.  If you need to sit down in the shower, use a plastic, non-slip stool.  Keep the floor dry. Clean up any water that spills on the floor as soon as it happens.  Remove soap buildup in the tub or shower regularly.  Attach bath mats securely with double-sided non-slip rug tape.  Do not have throw rugs and other things on the floor that can make you trip. What can I do in the bedroom?  Use night lights.  Make sure that you have a light by your bed that is easy to reach.  Do not use any sheets or blankets that are too big for your bed. They should not hang down onto the floor.  Have a firm chair that has side arms. You can use this for support while you get dressed.  Do not have throw rugs and other things on the floor that can make you trip. What can I do in the kitchen?  Clean up any spills right away.  Avoid walking on wet floors.  Keep items that you use a lot in easy-to-reach places.  If you need to reach something above you, use a strong step stool that has a grab bar.  Keep electrical cords out of the way.  Do not use floor polish or wax that makes floors slippery. If you must use wax, use non-skid floor wax.  Do not have throw rugs and other things on the floor that can make you trip. What can I do with my stairs?  Do not leave any items on the stairs.  Make sure that there are handrails on both sides of the stairs and use them. Fix handrails that are broken or loose. Make sure that handrails are as long as the stairways.  Check any carpeting to make sure that it is firmly attached to the stairs. Fix any carpet that is loose or worn.  Avoid having throw rugs at the top or bottom of the stairs. If you do have throw rugs, attach them to the floor with carpet tape.  Make sure that you have a light switch at the top of the stairs and the bottom of the stairs. If you do not have them, ask someone to add them for  you. What else can I do to help prevent falls?  Wear shoes that:  Do not have high heels.  Have rubber bottoms.  Are comfortable and fit you well.  Are closed at the toe. Do not wear sandals.  If you use a stepladder:  Make sure that it is fully opened. Do not climb a closed stepladder.  Make sure that both sides of the stepladder are locked into place.  Ask someone to hold it for you, if possible.  Clearly mark and make sure that you can see:  Any grab bars or handrails.  First and last steps.  Where the edge of each step is.  Use tools that help you move around (mobility aids) if they are needed. These include:  Canes.  Walkers.  Scooters.  Crutches.  Turn on the lights when you go into a dark area. Replace any light bulbs as soon as they burn out.  Set up your furniture so you have a clear path. Avoid moving your furniture around.  If any of your floors are uneven, fix them.  If there are any pets around you, be aware of where they are.  Review your medicines with your doctor. Some medicines can make you feel dizzy. This can increase your chance of falling. Ask your doctor what other things that you can do to help prevent falls. This information is not intended to replace advice given to you by your health care provider. Make sure you discuss any questions you have with your health care provider. Document Released: 11/19/2008 Document Revised: 07/01/2015 Document Reviewed: 02/27/2014 Elsevier Interactive Patient Education  2017 [employer].

## 2019-08-21 ENCOUNTER — Telehealth: Payer: Self-pay

## 2019-08-21 MED ORDER — CHLORTHALIDONE 25 MG PO TABS: 25.0000 mg | ORAL_TABLET | Freq: Every day | ORAL | 3 refills | Status: DC

## 2019-08-21 MED ORDER — ACCU-CHEK SOFTCLIX LANCETS MISC
12 refills | Status: DC
Start: 2019-08-21 — End: 2019-09-23

## 2019-08-21 MED ORDER — ACCU-CHEK GUIDE VI STRP: ORAL_STRIP | 12 refills | Status: DC

## 2019-08-21 NOTE — Telephone Encounter (Signed)
Patient called stating that [facility] was running her chlorthalidone rx for the 50 mg. New rx sent for chlorthalidone 25 mg and accu chek guide strips and softclix lancets per Danelle Berry PAC.   Patient aware new rx sent to [facility].

## 2019-09-02 ENCOUNTER — Other Ambulatory Visit: Payer: Self-pay

## 2019-09-02 DIAGNOSIS — E11649 Type 2 diabetes mellitus with hypoglycemia without coma: Secondary | ICD-10-CM

## 2019-09-02 DIAGNOSIS — I10 Essential (primary) hypertension: Secondary | ICD-10-CM

## 2019-09-02 DIAGNOSIS — E782 Mixed hyperlipidemia: Secondary | ICD-10-CM

## 2019-09-02 MED ORDER — LISINOPRIL 10 MG PO TABS: 10.0000 mg | ORAL_TABLET | Freq: Every day | ORAL | 3 refills | Status: DC

## 2019-09-02 MED ORDER — ATORVASTATIN CALCIUM 80 MG PO TABS: ORAL_TABLET | ORAL | 3 refills | Status: DC

## 2019-09-22 DIAGNOSIS — M25552 Pain in left hip: Secondary | ICD-10-CM | POA: Diagnosis not present

## 2019-09-22 DIAGNOSIS — M19041 Primary osteoarthritis, right hand: Secondary | ICD-10-CM | POA: Diagnosis not present

## 2019-09-22 DIAGNOSIS — M170 Bilateral primary osteoarthritis of knee: Secondary | ICD-10-CM | POA: Diagnosis not present

## 2019-09-22 DIAGNOSIS — M19042 Primary osteoarthritis, left hand: Secondary | ICD-10-CM | POA: Diagnosis not present

## 2019-09-22 DIAGNOSIS — M47816 Spondylosis without myelopathy or radiculopathy, lumbar region: Secondary | ICD-10-CM | POA: Diagnosis not present

## 2019-09-22 DIAGNOSIS — M1612 Unilateral primary osteoarthritis, left hip: Secondary | ICD-10-CM | POA: Diagnosis not present

## 2019-09-23 ENCOUNTER — Ambulatory Visit (INDEPENDENT_AMBULATORY_CARE_PROVIDER_SITE_OTHER): Payer: Medicare Other | Admitting: Family Medicine

## 2019-09-23 ENCOUNTER — Encounter: Payer: Self-pay | Admitting: Family Medicine

## 2019-09-23 ENCOUNTER — Other Ambulatory Visit (HOSPITAL_COMMUNITY)
Admission: RE | Admit: 2019-09-23 | Discharge: 2019-09-23 | Disposition: A | Discharge status: Home / Self Care | Payer: Medicare Other | Source: Ambulatory Visit | Attending: Family Medicine | Admitting: Family Medicine

## 2019-09-23 ENCOUNTER — Other Ambulatory Visit: Payer: Self-pay

## 2019-09-23 ENCOUNTER — Telehealth: Payer: Self-pay

## 2019-09-23 VITALS — BP 128/80 | HR 76 | Temp 98.1°F | Resp 16 | Ht 64.0 in | Wt 164.1 lb

## 2019-09-23 DIAGNOSIS — R350 Frequency of micturition: Secondary | ICD-10-CM | POA: Diagnosis not present

## 2019-09-23 DIAGNOSIS — E1169 Type 2 diabetes mellitus with other specified complication: Secondary | ICD-10-CM

## 2019-09-23 DIAGNOSIS — E669 Obesity, unspecified: Secondary | ICD-10-CM | POA: Diagnosis not present

## 2019-09-23 DIAGNOSIS — N3941 Urge incontinence: Secondary | ICD-10-CM

## 2019-09-23 DIAGNOSIS — Z5181 Encounter for therapeutic drug level monitoring: Secondary | ICD-10-CM

## 2019-09-23 DIAGNOSIS — I10 Essential (primary) hypertension: Secondary | ICD-10-CM

## 2019-09-23 DIAGNOSIS — L4050 Arthropathic psoriasis, unspecified: Secondary | ICD-10-CM | POA: Diagnosis not present

## 2019-09-23 DIAGNOSIS — I77811 Abdominal aortic ectasia: Secondary | ICD-10-CM

## 2019-09-23 DIAGNOSIS — R7989 Other specified abnormal findings of blood chemistry: Secondary | ICD-10-CM | POA: Diagnosis not present

## 2019-09-23 MED ORDER — MIRABEGRON ER 25 MG PO TB24: 25.0000 mg | ORAL_TABLET | Freq: Every day | ORAL | 5 refills | Status: DC

## 2019-09-23 MED ORDER — OXYBUTYNIN CHLORIDE ER 5 MG PO TB24: 5.0000 mg | ORAL_TABLET | Freq: Every day | ORAL | 2 refills | Status: DC

## 2019-09-23 NOTE — Progress Notes (Signed)
Name: Sandy Salinas   MRN: [id number]    DOB: [birth year]   Date:09/23/2019       Progress Note  Chief Complaint  Patient presents with  . Urinary Frequency    unable to hold bladder,      Subjective:   Sandy Salinas is a 70 y.o. female, presents to clinic for urinary frequency and routine f/up   Follow up Diabetes, Hypertension, Hyperlipedemia  and new onset of bladder issues -   Urinary Frequency  This is a new problem. The current episode started more than 1 month ago. The problem occurs every urination. The problem has been gradually worsening. The patient is experiencing no pain. There has been no fever. She is not sexually active. There is no history of pyelonephritis. Associated symptoms include frequency and urgency. Pertinent negatives include no chills, discharge, flank pain, hematuria, hesitancy, nausea or possible pregnancy. She has tried nothing for the symptoms. The treatment provided no relief.   Hypertension:  Currently managed on chlorthalidone 25 and lisinopril 10 mg  Pt reports good med compliance and denies any SE.  No lightheadedness, hypotension, syncope. Blood pressure today is well controlled. BP Readings from Last 3 Encounters:  09/23/19 128/80  08/19/19 (!) 148/86  06/13/19 123/70   Pt denies CP, SOB, exertional sx, LE edema, palpitation, Ha's, visual disturbances  DM - has been well controlled on 500 mg metformin BID, no SE or concerns UTD on foot exam, going to get eye exam done this month On ACEI and statin Due for urine micro and repeat A1C Not monitoring blood sugars Lab Results  Component Value Date   HGBA1C 7.1 (H) 05/15/2019   HLD - on atorvastatin 80 mg, good compliance Last lipids well controlled Will repeat annually Lab Results  Component Value Date   CHOL 127 05/15/2019   HDL 37 (L) 05/15/2019   LDLCALC 68 05/15/2019   TRIG 139 05/15/2019   CHOLHDL 3.4 05/15/2019   - Denies: Chest pain, shortness of breath, myalgias,  claudication     Current Outpatient Medications:  .  atorvastatin (LIPITOR) 80 MG tablet, TAKE 1 TABLET (80 MG) BY MOUTH AT BEDTIME, Disp: 90 tablet, Rfl: 3 .  Calcium Carbonate-Vitamin D (CALTRATE 600+D PO), Take by mouth daily., Disp: , Rfl:  .  chlorthalidone (HYGROTON) 25 MG tablet, Take 1 tablet (25 mg total) by mouth daily., Disp: 90 tablet, Rfl: 3 .  Cholecalciferol (VITAMIN D) 2000 units tablet, One by mouth three or four days per week, Disp: , Rfl:  .  DULoxetine (CYMBALTA) 30 MG capsule, Take by mouth., Disp: , Rfl:  .  gabapentin (NEURONTIN) 100 MG capsule, Take by mouth., Disp: , Rfl:  .  lisinopril (ZESTRIL) 10 MG tablet, Take 1 tablet (10 mg total) by mouth daily., Disp: 90 tablet, Rfl: 3 .  Magnesium 100 MG TABS, Take by mouth., Disp: , Rfl:  .  metFORMIN (GLUCOPHAGE) 500 MG tablet, Take 1 tablet (500 mg total) by mouth 2 (two) times daily., Disp: 180 tablet, Rfl: 1 .  Multiple Vitamin (MULTIVITAMIN) tablet, Take 1 tablet by mouth daily., Disp: , Rfl:  .  Potassium (GNP POTASSIUM) 99 MG TABS, Take by mouth., Disp: , Rfl:  .  TURMERIC PO, Take 1 capsule by mouth at bedtime., Disp: , Rfl:  .  vitamin B-12 (CYANOCOBALAMIN) 100 MCG tablet, Take by mouth daily., Disp: , Rfl:  .  vitamin E 100 UNIT capsule, Take by mouth daily. Reported on 08/19/2015, Disp: , Rfl:  .  Accu-Chek Softclix Lancets lancets, Use as instructed to check blood sugar up to 3 times per day, Disp: 100 each, Rfl: 12 .  glucose blood (ACCU-CHEK GUIDE) test strip, Use as instructed to check blood sugar up to 3 times per day (Patient not taking: Reported on 09/23/2019), Disp: 100 each, Rfl: 12 .  mirabegron ER (MYRBETRIQ) 25 MG TB24 tablet, Take 1 tablet (25 mg total) by mouth daily., Disp: 30 tablet, Rfl: 5  Patient Active Problem List   Diagnosis Date Noted  . Trochanteric bursitis of left hip 05/20/2019  . Gait abnormality 03/25/2019  . Vertigo 03/25/2019  . Microalbuminuria due to type 2 diabetes mellitus  (HCC) 02/20/2018  . Obesity (BMI 30.0-34.9) 02/19/2017  . Breast calcification seen on mammogram 12/01/2016  . Age-related osteoporosis without current pathological fracture 11/08/2016  . 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 Abnormal EKG 05/12/2016  . 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 Xerosis of skin 12/13/2015  . Psoriatic arthritis (HCC) 11/18/2015  . Lumbar radiculopathy, acute 10/14/2015  . Vitamin D deficiency 08/18/2014  . Hyperlipidemia   . Hypertension   . Primary osteoarthritis of left knee   . Diabetes mellitus type 2 in obese (HCC) 07/24/2014  . Status post bariatric surgery 07/24/2014    Past Surgical History:  Procedure Laterality Date  . ABDOMINAL HYSTERECTOMY  1975   total  . BREAST BIOPSY Right 04/09/2012   x 2, FIBROADENOMA WITH COARSE CALCIFICATIONS.done by byrnett  . BREAST BIOPSY Right 09/04/2017   Affirm Bx- X clip path pending  . CYSTECTOMY  2003  . ROUX-EN-Y GASTRIC BYPASS  2009  . TONSILLECTOMY  1970    Family History  Problem Relation Age of Onset  . Alzheimer's disease Mother   . Heart murmur Mother   . Dementia Mother   . AAA (abdominal aortic aneurysm) Father   . Heart disease Father   . Asthma Sister   . Hypertension Sister   . Cancer Sister        breast  . Breast cancer Sister        late 2240's  . Heart disease Brother   . Hypertension Brother   . Aneurysm Paternal Aunt   . Cancer Paternal Grandmother        possible, not sure what kind  . Lung cancer Paternal Uncle   . Cancer Cousin     Social History   Tobacco Use  . Smoking status: Never Smoker  . Smokeless tobacco: Never Used  Vaping Use  . Vaping Use: Never used  Substance Use Topics  . Alcohol use: No  . Drug use: No     Allergies  Allergen Reactions  . Codeine Nausea Only  . Secukinumab Other (See Comments)    Made her throat raw for 2 months so is not taking this now    Health  Maintenance  Topic Date Due  . MAMMOGRAM  09/02/2019  . INFLUENZA VACCINE  09/07/2019  . HEMOGLOBIN A1C  11/14/2019  . FOOT EXAM  05/14/2020  . Fecal DNA (Cologuard)  06/14/2020  . OPHTHALMOLOGY EXAM  08/19/2020  . TETANUS/TDAP  04/15/2026  . DEXA SCAN  Completed  . COVID-19 Vaccine  Completed  . Hepatitis C Screening  Completed  . PNA vac Low Risk Adult  Completed    Chart Review Today:  I personally reviewed active problem list, medication list, allergies, family history, social history, health maintenance, notes from last encounter, lab results, imaging with the  patient/caregiver today.   Review of Systems  Constitutional: Negative.  Negative for chills.  HENT: Negative.   Eyes: Negative.   Respiratory: Negative.   Cardiovascular: Negative.   Gastrointestinal: Negative.  Negative for nausea.  Endocrine: Negative.   Genitourinary: Positive for frequency and urgency. Negative for flank pain, hematuria and hesitancy.  Musculoskeletal: Negative.   Skin: Negative.   Allergic/Immunologic: Negative.   Neurological: Negative.   Hematological: Negative.   Psychiatric/Behavioral: Negative.   All other systems reviewed and are negative.   10 Systems reviewed and are negative for acute change except as noted in the HPI.    Objective:   Vitals:   09/23/19 1039  BP: 128/80  Pulse: 76  Resp: 16  Temp: 98.1 F (36.7 C)  TempSrc: Oral  SpO2: 98%  Weight: 164 lb 1.6 oz (74.4 kg)  Height: 5\' 4"  (1.626 m)    Body mass index is 28.17 kg/m.  Physical Exam Vitals and nursing note reviewed. Exam conducted with a chaperone present.  Constitutional:      General: She is not in acute distress.    Appearance: Normal appearance. She is well-developed. She is not ill-appearing, toxic-appearing or diaphoretic.     Interventions: Face mask in place.  HENT:     Head: Normocephalic and atraumatic.     Right Ear: External ear normal.     Left Ear: External ear normal.  Eyes:      General: Lids are normal. No scleral icterus.       Right eye: No discharge.        Left eye: No discharge.     Conjunctiva/sclera: Conjunctivae normal.  Neck:     Trachea: Phonation normal. No tracheal deviation.  Cardiovascular:     Rate and Rhythm: Normal rate and regular rhythm.     Pulses: Normal pulses.          Radial pulses are 2+ on the right side and 2+ on the left side.       Posterior tibial pulses are 2+ on the right side and 2+ on the left side.     Heart sounds: Normal heart sounds. No murmur heard.  No friction rub. No gallop.   Pulmonary:     Effort: Pulmonary effort is normal. No respiratory distress.     Breath sounds: Normal breath sounds. No stridor. No wheezing, rhonchi or rales.  Chest:     Chest wall: No tenderness.  Abdominal:     General: Bowel sounds are normal. There is no distension.     Palpations: Abdomen is soft.     Tenderness: There is no abdominal tenderness. There is no right CVA tenderness, left CVA tenderness or guarding.  Genitourinary:    General: Normal vulva.     Labia:        Right: No rash or tenderness.        Left: No rash or tenderness.      Urethra: No prolapse or urethral swelling.     Comments: Some vaginal wall relaxation, no discharge Musculoskeletal:     Right lower leg: No edema.     Left lower leg: No edema.  Skin:    General: Skin is warm and dry.     Coloration: Skin is not jaundiced or pale.     Findings: No rash.  Neurological:     Mental Status: She is alert.     Motor: No abnormal muscle tone.     Gait: Gait normal.  Psychiatric:  Mood and Affect: Mood normal.        Speech: Speech normal.        Behavior: Behavior normal.         Assessment & Plan:     ICD-10-CM   1. Urinary frequency  R35.0 Hemoglobin A1C    COMPLETE METABOLIC PANEL WITH GFR    Cervicovaginal ancillary only    Urinalysis, Routine w reflex microscopic    Urine Culture   r/o infections, does not appear to have atrophic  vaginitis, some suspected pelvic floor dysfunction, continued dietary lifestyle changes and journal   2. Diabetes mellitus type 2 in obese (HCC)  E11.69 Hemoglobin A1C   E66.9 COMPLETE METABOLIC PANEL WITH GFR   Diabetes has been stable she has her eye exam scheduled, will recheck urine testing and urine micro today  3. Elevated LFTs  R79.89    last labs elevated- no ETOH, no tylenol use in high amounts (sometimes tylenol PM), statins and lipids were stable with last labs, recheck - work up still abn  4. Essential hypertension  I10    BP well controlled today  5. Psoriatic arthritis (HCC) Chronic L40.50    an OA with sx noted to spine, hips, knees - she is seeing orthopedic specialists  6. Abdominal aortic ectasia (HCC) Chronic I77.811    on statin, monitoring, no signs or sx of claudication or PAD, medicare [place] screen in 2018 - recommended recheck in 5 years - due 2023  7. Medication monitoring encounter  Z51.81 Hemoglobin A1C    COMPLETE METABOLIC PANEL WITH GFR   Patient presents for routine follow-up but also has urinary frequency which has not been investigated or worked up in the office in person.  We decided together to focus on that and do basic labs -her blood pressure is stable today, cholesterol was well controlled at her last office visit with last labs, we will do those routine follow-up at her next appointment. We did a very quick exam checking her GU area and external genitalia, did not do a speculum exam He is status post total hysterectomy in 1975, she is s/p Roux-en-Y gastric bypass -she is not currently sexually active, she is not having any dysuria, hematuria, and she denies any change in urine color or odor.  She has been having gradual progression of urinary urgency with some urge incontinence and urinary frequency going at least once an hour.  She reports that she has not changed her fluid intake or amount of caffeine intake, she does take several over-the-counter vitamins and  supplements that that also has not changed and her urinary frequency has gradually worsened over the past several months Blood sugar is well controlled I do not suspect that its due to hypoglycemia or auto diuresing She agreed to broad testing to rule out all types of infections including urinalysis, urine culture and vaginal swab and testing Likely to be more pelvic floor dysfunction or overactive bladder  She was encouraged to decrease caffeine amounts, pay attention to over-the-counter medications and supplements and to keep a journal of such.  She was given a handout on urinary frequency and suggestions on lifestyle diet and supplement changes that may be affecting her bladder.  Explained that even though some of her use of caffeine or supplements may not have changed her body may be changing how they processed them over time.  Not on any allergy medications and does not use Benadryl I did not see anything else on her chart which would  be a anticholinergic which could be contributing towards urinary frequency or bladder problems.  I did suggest going to urologist or a urogynecologist for follow-up if all testing is unremarkable today.  Does want to wait on that referral. She wanted to see if she could afford medications for overactive bladder-Myrbetriq 25 mg was prescribed -explained other medications which may be required to try first including Ditropan etc. on did explain the side effects and mechanism of action.  She preferred to see if Myrbetriq was affordable first  Abdominal aortic ectasia -reviewed past ultrasound in 2018 recommendations from the radiology report suggested follow-up in 5 years, patient had consulted with [facility] vascular specialist Dr. Wyn Quaker, in May 2018 he suggested recheck every 2 to 3 years due to a strong genetic predisposition for the patient.  I do not see any follow-up she would be overdue for this visit is just over 3 years    Health Maintenance  Topic Date Due  .  MAMMOGRAM  09/02/2019  . INFLUENZA VACCINE  09/07/2019  . HEMOGLOBIN A1C  11/14/2019  . FOOT EXAM  05/14/2020  . Fecal DNA (Cologuard)  06/14/2020  . OPHTHALMOLOGY EXAM  08/19/2020  . TETANUS/TDAP  04/15/2026  . DEXA SCAN  Completed  . COVID-19 Vaccine  Completed  . Hepatitis C Screening  Completed  . PNA vac Low Risk Adult  Completed   Return for flu shot when in stock  Mammogram is scheduled  Return in about 6 months (around 03/25/2020) for Routine follow-up.   Danelle Berry, PA-C 09/23/19 3:04 PM

## 2019-09-23 NOTE — Patient Instructions (Addendum)
Urinary Frequency, Adult Urinary frequency means urinating more often than usual. You may urinate every 1-2 hours even though you drink a normal amount of fluid and do not have a bladder infection or condition. Although you urinate more often than normal, the total amount of urine produced in a day is normal. With urinary frequency, you may have an urgent need to urinate often. The stress and anxiety of needing to find a bathroom quickly can make this urge worse. This condition may go away on its own or you may need treatment at home. Home treatment may include bladder training, exercises, taking medicines, or making changes to your diet. Follow these instructions at home: Bladder health   Keep a bladder diary if told by your health care provider. Keep track of: ? What you eat and drink. ? How often you urinate. ? How much you urinate.  Follow a bladder training program if told by your health care provider. This may include: ? Learning to delay going to the bathroom. ? Double urinating (voiding). This helps if you are not completely emptying your bladder. ? Scheduled voiding.  Do Kegel exercises as told by your health care provider. Kegel exercises strengthen the muscles that help control urination, which may help the condition. Eating and drinking  If told by your health care provider, make diet changes, such as: ? Avoiding caffeine. ? Drinking fewer fluids, especially alcohol. ? Not drinking in the evening. ? Avoiding foods or drinks that may irritate the bladder. These include coffee, tea, soda, artificial sweeteners, citrus, tomato-based foods, and chocolate. ? Eating foods that help prevent or ease constipation. Constipation can make this condition worse. Your health care provider may recommend that you:  Drink enough fluid to keep your urine pale yellow.  Take over-the-counter or prescription medicines.  Eat foods that are high in fiber, such as beans, whole grains, and fresh  fruits and vegetables.  Limit foods that are high in fat and processed sugars, such as fried or sweet foods. General instructions  Take over-the-counter and prescription medicines only as told by your health care provider.  Keep all follow-up visits as told by your health care provider. This is important. Contact a health care provider if:  You start urinating more often.  You feel pain or irritation when you urinate.  You notice blood in your urine.  Your urine looks cloudy.  You develop a fever.  You begin vomiting. Get help right away if:  You are unable to urinate. Summary  Urinary frequency means urinating more often than usual. With urinary frequency, you may urinate every 1-2 hours even though you drink a normal amount of fluid and do not have a bladder infection or other bladder condition.  Your health care provider may recommend that you keep a bladder diary, follow a bladder training program, or make dietary changes.  If told by your health care provider, do Kegel exercises to strengthen the muscles that help control urination.  Take over-the-counter and prescription medicines only as told by your health care provider.  Contact a health care provider if your symptoms do not improve or get worse. This information is not intended to replace advice given to you by your health care provider. Make sure you discuss any questions you have with your health care provider. Document Revised: 08/02/2017 Document Reviewed: 08/02/2017 Elsevier Patient Education  2020 [employer]  

## 2019-09-23 NOTE — Telephone Encounter (Signed)
Copied from CRM [id number]. Topic: General - Call Back - No Documentation >> Sep 23, 2019  2:46 PM Reuben Likes D wrote: Reason for CRM: Patient states she was seen today and PCP prescribed bladder control medicine that costs $300. Patient would like to know if PCP can prescribe something else.

## 2019-09-24 LAB — COMPLETE METABOLIC PANEL WITH GFR
AG Ratio: 1.7 (calc) (ref 1.0–2.5)
ALT: 31 U/L — ABNORMAL HIGH (ref 6–29)
AST: 47 U/L — ABNORMAL HIGH (ref 10–35)
Albumin: 4.4 g/dL (ref 3.6–5.1)
Alkaline phosphatase (APISO): 74 U/L (ref 37–153)
BUN: 13 mg/dL (ref 7–25)
CO2: 31 mmol/L (ref 20–32)
Calcium: 9.6 mg/dL (ref 8.6–10.4)
Chloride: 98 mmol/L (ref 98–110)
Creat: 0.68 mg/dL (ref 0.50–0.99)
GFR, Est African American: 104 mL/min/{1.73_m2} (ref >=60)
GFR, Est Non African American: 90 mL/min/{1.73_m2} (ref >=60)
Globulin: 2.6 g/dL (calc) (ref 1.9–3.7)
Glucose, Bld: 101 mg/dL — ABNORMAL HIGH (ref 65–99)
Potassium: 3.9 mmol/L (ref 3.5–5.3)
Sodium: 137 mmol/L (ref 135–146)
Total Bilirubin: 0.5 mg/dL (ref 0.2–1.2)
Total Protein: 7 g/dL (ref 6.1–8.1)

## 2019-09-24 LAB — URINE CULTURE
MICRO NUMBER:: 10837934
Result:: NO GROWTH
SPECIMEN QUALITY:: ADEQUATE

## 2019-09-24 LAB — CERVICOVAGINAL ANCILLARY ONLY
Bacterial Vaginitis (gardnerella): POSITIVE — AB
Candida Glabrata: NEGATIVE
Candida Vaginitis: NEGATIVE
Chlamydia: NEGATIVE
Comment: NEGATIVE
Comment: NEGATIVE
Comment: NEGATIVE
Comment: NEGATIVE
Comment: NEGATIVE
Comment: NORMAL
Neisseria Gonorrhea: NEGATIVE
Trichomonas: NEGATIVE

## 2019-09-24 LAB — URINALYSIS, ROUTINE W REFLEX MICROSCOPIC
Bilirubin Urine: NEGATIVE
Glucose, UA: NEGATIVE
Hgb urine dipstick: NEGATIVE
Ketones, ur: NEGATIVE
Leukocytes,Ua: NEGATIVE
Nitrite: NEGATIVE
Protein, ur: NEGATIVE
Specific Gravity, Urine: 1.008 (ref 1.001–1.03)
pH: 6.5 (ref 5.0–8.0)

## 2019-09-24 LAB — HEMOGLOBIN A1C
Hgb A1c MFr Bld: 7.2 % of total Hgb — ABNORMAL HIGH (ref <5.7)
Mean Plasma Glucose: 160 (calc)
eAG (mmol/L): 8.9 (calc)

## 2019-09-24 NOTE — Telephone Encounter (Signed)
Pt.notified

## 2019-09-26 ENCOUNTER — Ambulatory Visit
Admission: RE | Admit: 2019-09-26 | Discharge: 2019-09-26 | Disposition: A | Discharge status: Home / Self Care | Payer: Medicare Other | Source: Ambulatory Visit | Attending: Family Medicine | Admitting: Family Medicine

## 2019-09-26 ENCOUNTER — Other Ambulatory Visit: Payer: Self-pay

## 2019-09-26 DIAGNOSIS — Z1231 Encounter for screening mammogram for malignant neoplasm of breast: Secondary | ICD-10-CM | POA: Diagnosis not present

## 2019-09-26 DIAGNOSIS — Z23 Encounter for immunization: Secondary | ICD-10-CM | POA: Diagnosis not present

## 2019-09-27 ENCOUNTER — Encounter: Payer: Self-pay | Admitting: Family Medicine

## 2019-10-27 ENCOUNTER — Ambulatory Visit: Payer: Self-pay | Admitting: Family Medicine

## 2019-10-27 NOTE — Telephone Encounter (Signed)
Pt reports one brief episode of disorientation yesterday when out driving. States episode lasted 30 minutes, "Drove around not recognizing where I was and I know those roads well." States resolved and was able to drive home. Denies any headache, no dizziness, no unilateral weakness or speech difficulties. "Noting going on with me at the time." States started 2 new meds mid August, Oxybutynin 5mg  and Cymbalta 30mg . No narcotic or alcohol use. Has not had any other episodes or symptoms, Alert and oriented x 3.  Pt has appt 11/24/2019 with Leisa.  NT called practice, , for any earlier appt availability. Permission to schedule with Dr. 11/26/2019 tomorrow. Care advise given. Go to ED if symptoms reoccur. Pt verbalizes understanding.   Reason for Disposition . [1] Acting confused (e.g., disoriented, slurred speech) AND [2] brief (now gone)  Answer Assessment - Initial Assessment Questions 1. LEVEL OF CONSCIOUSNESS: "How is he (she, the patient) acting right now?" (e.g., alert-oriented, confused, lethargic, stuporous, comatose)     Episode yesterday 2. ONSET: "When did the confusion start?"  (minutes, hours, days)     Since resolved, lasted 30 minutes. 3. PATTERN "Does this come and go, or has it been constant since it started?"  "Is it present now?"     Not present now. 4. ALCOHOL or DRUGS: "Has he been drinking alcohol or taking any drugs?"      no 5. NARCOTIC MEDICATIONS: "Has he been receiving any narcotic medications?" (e.g., morphine, Vicodin)     No 6. CAUSE: "What do you think is causing the confusion?"      Unsure 7. OTHER SYMPTOMS: "Are there any other symptoms?" (e.g., difficulty breathing, headache, fever, weakness)   None  Protocols used: CONFUSION - DELIRIUM-A-AH

## 2019-10-27 NOTE — Progress Notes (Signed)
Patient ID: Sandy Salinas, female    DOB: October 09, 1950, 69 y.o.   MRN: [id number]  PCP: Danelle Berry, PA-C  Chief Complaint  Patient presents with  . Disoriented    This Sunday 9/19 she was driving down the road and then next thing she knew she was 30 minutes away from where she had last been "awake" and her lip was kind of numb    Subjective:   Sandy Salinas is a 69 y.o. female, presents to clinic with CC of the following:  Chief Complaint  Patient presents with  . Disoriented    This Sunday 9/19 she was driving down the road and then next thing she knew she was 30 minutes away from where she had last been "awake" and her lip was kind of numb    HPI:  Patient is a 69 year old female patient of Danelle Berry Called the office yesterday with the following message noted:  Pt reports one brief episode of disorientation yesterday when out driving. States episode lasted 30 minutes, "Drove around not recognizing where I was and I know those roads well." States resolved and was able to drive home. Denies any headache, no dizziness, no unilateral weakness or speech difficulties. "Noting going on with me at the time." States started 2 new meds mid August, Oxybutynin XR - 5mg  and Cymbalta 30mg . No narcotic or alcohol use. Has not had any other episodes or symptoms, Alert and oriented x 3.  Pt has appt 11/24/2019 with Leisa.  NT called practice, , for any earlier appt availability. Permission to schedule with Dr. 11/26/2019 tomorrow. Care advise given. Go to ED if symptoms reoccur. Pt verbalizes understanding.  Follows up today.  Last visit with Bjorn Loser therapy was 09/23/2019 with the following issues addressed during that visit: Urinary Frequency  This is a new problem. The current episode started more than 1 month ago. The problem occurs every urination. The problem has been gradually worsening. The patient is experiencing no pain. There has been no fever. She is not sexually  active. There is no history of pyelonephritis. Associated symptoms include frequency and urgency. Pertinent negatives include no chills, discharge, flank pain, hematuria, hesitancy, nausea or possible pregnancy. She has tried nothing for the symptoms. The treatment provided no relief.   Hypertension:  Currently managed on chlorthalidone 25 and lisinopril 10 mg  Pt reports good med compliance and denies any SE.  No lightheadedness, hypotension, syncope. Blood pressure today is well controlled. BP Readings from Last 3 Encounters:  10/28/19 118/68  09/23/19 128/80  08/19/19 (!) 148/86    Pt denies CP, SOB, exertional sx, LE edema, palpitation, Ha's, visual disturbances  DM - has been well controlled on 500 mg metformin BID, no SE or concerns UTD on foot exam, going to get eye exam done this month On ACEI and statin Due for urine micro and repeat A1C Not monitoring blood sugars Recent Labs       Lab Results  Component Value Date   HGBA1C 7.1 (H) 05/15/2019     HLD - on atorvastatin 80 mg, good compliance Last lipids well controlled Will repeat annually Recent Labs       Lab Results  Component Value Date   CHOL 127 05/15/2019   HDL 37 (L) 05/15/2019   LDLCALC 68 05/15/2019   TRIG 139 05/15/2019   CHOLHDL 3.4 05/15/2019     - Denies: Chest pain, shortness of breath, myalgias, claudication  Last labs from Aug 2021 reviewed  Last metabolic panel Lab Results  Component Value Date   GLUCOSE 101 (H) 09/23/2019   NA 137 09/23/2019   K 3.9 09/23/2019   CL 98 09/23/2019   CO2 31 09/23/2019   BUN 13 09/23/2019   CREATININE 0.68 09/23/2019   GFRNONAA 90 09/23/2019   GFRAA 104 09/23/2019   CALCIUM 9.6 09/23/2019   PROT 7.0 09/23/2019   ALBUMIN 4.1 06/06/2016   LABGLOB 2.4 02/18/2015   AGRATIO 1.8 02/18/2015   BILITOT 0.5 09/23/2019   ALKPHOS 76 06/06/2016   AST 47 (H) 09/23/2019   ALT 31 (H) 09/23/2019  ALT and AST slightly better from check 5 months  prior  Lab Results  Component Value Date   HGBA1C 7.2 (H) 09/23/2019   HGBA1C 7.1 (H) 05/15/2019   HGBA1C 7.2 (H) 08/26/2018    Lab Results  Component Value Date   MICROALBUR <0.2 05/15/2019   LDLCALC 68 05/15/2019   CREATININE 0.68 09/23/2019    Has not had further episodes since the one reported two days ago while driving.  She noted today that while she was driving to [place], she seemed to lose recognition of where she was, the things around her, and felt more confused at times, but did make it to [place] driving.  When arrived, stated she did not remember how she got there, she noted her lips felt a little numb, but denied any other symptoms, with no vision concerns, no headaches, no facial droop, no one-sided symptoms.  She was able to go to [place] and get her shopping done, and return home.  She states when she returned home, she did not feel good, not any specific symptoms, just did not feel right as she described, and took a couple naps that day.  Again denies any headaches, vision changes, one-sided symptoms, no numbness or tingling in her face or extremities, no facial droop, and denying any chest pains, palpitations, shortness of breath at any point in time.  Denies any increased lower extremity swelling.  No fevers or nausea or vomiting. She took her blood sugar when she got home and it was 130.  She states she ate breakfast that morning before heading to [employer], and does not always stay well-hydrated.  She did not check her blood pressure that day. No seizure history in her past. Of note, she did see neurology earlier this year, with some dizziness, some imbalance symptoms, and question of hearing loss of her right ear which she noted as more decreased hearing at times, and an MRI was done in April with the following impression:   IMPRESSION: 1. No acute intracranial abnormality or mass. 2. Unremarkable internal auditory canal imaging. 3. Mild-to-moderate chronic small vessel  ischemic disease  She notes she has not had symptoms like this in her past.  She notes her symptoms of urinary frequency have been much improved with the addition of oxybutynin, and noted to her today that possible adverse reactions of oxybutynin include hallucinations, seizures, psychosis, somnolence among many others listed. She also noted that she has been better with her arthritis symptoms of her back and hip since starting the Cymbalta, and noted to her today that possible adverse reactions of cymbalta include seizures, hypotension, syncope, among many others listed She has not taken any other new medicines or supplements in the recent past.  Patient Active Problem List   Diagnosis Date Noted  . Trochanteric bursitis of left hip 05/20/2019  . Gait abnormality 03/25/2019  . Vertigo 03/25/2019  . Microalbuminuria due  to type 2 diabetes mellitus (HCC) 02/20/2018  . Obesity (BMI 30.0-34.9) 02/19/2017  . Breast calcification seen on mammogram 12/01/2016  . Age-related osteoporosis without current pathological fracture 11/08/2016  . 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 Abnormal EKG 05/12/2016  . 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 Xerosis of skin 12/13/2015  . Psoriatic arthritis (HCC) 11/18/2015  . Lumbar radiculopathy, acute 10/14/2015  . Vitamin D deficiency 08/18/2014  . Hyperlipidemia   . Hypertension   . Primary osteoarthritis of left knee   . Diabetes mellitus type 2 in obese (HCC) 07/24/2014  . Status post bariatric surgery 07/24/2014      Current Outpatient Medications:  .  atorvastatin (LIPITOR) 80 MG tablet, TAKE 1 TABLET (80 MG) BY MOUTH AT BEDTIME, Disp: 90 tablet, Rfl: 3 .  Calcium Carbonate-Vitamin D (CALTRATE 600+D PO), Take by mouth daily., Disp: , Rfl:  .  chlorthalidone (HYGROTON) 25 MG tablet, Take 1 tablet (25 mg total) by mouth daily., Disp: 90 tablet, Rfl: 3 .  Cholecalciferol (VITAMIN D)  2000 units tablet, One by mouth three or four days per week, Disp: , Rfl:  .  DULoxetine (CYMBALTA) 30 MG capsule, Take by mouth., Disp: , Rfl:  .  gabapentin (NEURONTIN) 100 MG capsule, Take by mouth., Disp: , Rfl:  .  lisinopril (ZESTRIL) 10 MG tablet, Take 1 tablet (10 mg total) by mouth daily., Disp: 90 tablet, Rfl: 3 .  Magnesium 100 MG TABS, Take by mouth., Disp: , Rfl:  .  metFORMIN (GLUCOPHAGE) 500 MG tablet, Take 1 tablet (500 mg total) by mouth 2 (two) times daily., Disp: 180 tablet, Rfl: 1 .  mirabegron ER (MYRBETRIQ) 25 MG TB24 tablet, Take 1 tablet (25 mg total) by mouth daily., Disp: 30 tablet, Rfl: 5 .  Multiple Vitamin (MULTIVITAMIN) tablet, Take 1 tablet by mouth daily., Disp: , Rfl:  .  oxybutynin (DITROPAN-XL) 5 MG 24 hr tablet, Take 1 tablet (5 mg total) by mouth at bedtime., Disp: 60 tablet, Rfl: 2 .  Potassium (GNP POTASSIUM) 99 MG TABS, Take by mouth., Disp: , Rfl:  .  TURMERIC PO, Take 1 capsule by mouth at bedtime., Disp: , Rfl:  .  vitamin B-12 (CYANOCOBALAMIN) 100 MCG tablet, Take by mouth daily., Disp: , Rfl:  .  vitamin E 100 UNIT capsule, Take by mouth daily. Reported on 08/19/2015, Disp: , Rfl:    Allergies  Allergen Reactions  . Codeine Nausea Only  . Secukinumab Other (See Comments)    Made her throat raw for 2 months so is not taking this now     Past Surgical History:  Procedure Laterality Date  . ABDOMINAL HYSTERECTOMY  1975   total  . BREAST BIOPSY Right 04/09/2012   x 2, FIBROADENOMA WITH COARSE CALCIFICATIONS.done by byrnett  . BREAST BIOPSY Right 09/04/2017   Affirm Bx- X clip/neg  . CYSTECTOMY  2003  . ROUX-EN-Y GASTRIC BYPASS  2009  . TONSILLECTOMY  1970     Family History  Problem Relation Age of Onset  . Alzheimer's disease Mother   . Heart murmur Mother   . Dementia Mother   . AAA (abdominal aortic aneurysm) Father   . Heart disease Father   . Asthma Sister   . Hypertension Sister   . Cancer Sister        breast  . Breast  cancer Sister        late 17's  . Heart disease Brother   .  Hypertension Brother   . Aneurysm Paternal Aunt   . Cancer Paternal Grandmother        possible, not sure what kind  . Lung cancer Paternal Uncle   . Cancer Cousin      Social History   Tobacco Use  . Smoking status: Never Smoker  . Smokeless tobacco: Never Used  Substance Use Topics  . Alcohol use: No    With staff assistance, above reviewed with the patient today.  ROS: As per HPI, she denied any history of anemia or thyroid disease, no recent black or dark stools or bleeding per rectum, otherwise no specific complaints on a limited and focused system review   No results found for this or any previous visit (from the past 72 hour(s)).   PHQ2/9: Depression screen [facility] 2/9 10/28/2019 09/23/2019 08/19/2019 06/13/2019 05/15/2019  Decreased Interest 0 0 0 0 0  Down, Depressed, Hopeless 0 0 0 0 0  PHQ - 2 Score 0 0 0 0 0  Altered sleeping - 0 - 0 0  Tired, decreased energy - 0 - 0 0  Change in appetite - 0 - 0 0  Feeling bad or failure about yourself  - 0 - 0 0  Trouble concentrating - 0 - 0 0  Moving slowly or fidgety/restless - 0 - 0 0  Suicidal thoughts - 0 - 0 0  PHQ-9 Score - 0 - 0 0  Difficult doing work/chores - Not difficult at all - Not difficult at all Not difficult at all  Some recent data might be hidden   PHQ-2/9 Result is neg  Fall Risk: Fall Risk  10/28/2019 09/23/2019 08/19/2019 06/13/2019 06/13/2019  Falls in the past year? 0 0 0 1 1  Number falls in past yr: 0 0 0 0 1  Injury with Fall? 0 0 0 0 0  Risk for fall due to : - - No Fall Risks History of fall(s) -  Follow up Falls evaluation completed Falls evaluation completed Falls prevention discussed Falls evaluation completed;Education provided -      Objective:   Vitals:   10/28/19 1420  BP: 118/68  Pulse: 91  Resp: 16  Temp: 98 F (36.7 C)  TempSrc: Oral  SpO2: 97%  Weight: 164 lb 6.4 oz (74.6 kg)  Height: 5\' 4"  (1.626 m)    Body mass  index is 28.22 kg/m.  Physical Exam   NAD, masked, pleasant HEENT - [place]/AT, sclera anicteric, PERRL, EOMI without nystagmus, conj - non-inj'ed, TM's and canals clear, pharynx clear Neck - supple, no adenopathy, no TM, carotids 2+ and = without bruits bilat Car - RRR without m/g/r Pulm- RR and effort normal at rest, CTA without wheeze or rales Abd - soft, NT diffusely, ND,  Back - no CVA tenderness Skin- no rash noted on exposed areas, she had several bruises scattered on her upper extremities distal, and noted this was caused by limbs hitting her arms and not from bruising easily. Ext - no LE edema,  Neuro/psychiatric - affect was not flat, appropriate with conversation  Alert and oriented  Cranial nerves II through XII intact with acuity not tested in the office, and hearing grossly intact with rubbing fingers together detected equally bilateral, no tongue deviation, no facial droop, good muscle strength testing facial muscles, sensation intact to light touch in the face  Grossly non-focal - good strength on testing extremities, sensation intact to LT in distal extremities, Romberg was negative, no pronator drift, good finger-to-nose, gait was grossly  normal walking in the office today, with a little deviation noted on tandem walk.  She struggled more balancing on her right foot than her left, but was able to balance on each foot for several seconds  Speech  normal   Results for orders placed or performed in visit on 09/23/19  Urine Culture   Specimen: Urine  Result Value Ref Range   MICRO NUMBER: [id number]    SPECIMEN QUALITY: Adequate    Sample Source URINE    STATUS: FINAL    Result: No Growth   Hemoglobin A1C  Result Value Ref Range   Hgb A1c MFr Bld 7.2 (H) <5.7 % of total Hgb   Mean Plasma Glucose 160 (calc)   eAG (mmol/L) 8.9 (calc)  COMPLETE METABOLIC PANEL WITH GFR  Result Value Ref Range   Glucose, Bld 101 (H) 65 - 99 mg/dL   BUN 13 7 - 25 mg/dL   Creat 5.400.68 9.810.50 - 1.910.99  mg/dL   GFR, Est Non African American 90 > OR = 60 mL/min/1.2373m2   GFR, Est African American 104 > OR = 60 mL/min/1.2173m2   BUN/Creatinine Ratio NOT APPLICABLE 6 - 22 (calc)   Sodium 137 135 - 146 mmol/L   Potassium 3.9 3.5 - 5.3 mmol/L   Chloride 98 98 - 110 mmol/L   CO2 31 20 - 32 mmol/L   Calcium 9.6 8.6 - 10.4 mg/dL   Total Protein 7.0 6.1 - 8.1 g/dL   Albumin 4.4 3.6 - 5.1 g/dL   Globulin 2.6 1.9 - 3.7 g/dL (calc)   AG Ratio 1.7 1.0 - 2.5 (calc)   Total Bilirubin 0.5 0.2 - 1.2 mg/dL   Alkaline phosphatase (APISO) 74 37 - 153 U/L   AST 47 (H) 10 - 35 U/L   ALT 31 (H) 6 - 29 U/L  Urinalysis, Routine w reflex microscopic  Result Value Ref Range   Color, Urine YELLOW YELLOW   APPearance CLEAR CLEAR   Specific Gravity, Urine 1.008 1.001 - 1.03   pH 6.5 5.0 - 8.0   Glucose, UA NEGATIVE NEGATIVE   Bilirubin Urine NEGATIVE NEGATIVE   Ketones, ur NEGATIVE NEGATIVE   Hgb urine dipstick NEGATIVE NEGATIVE   Protein, ur NEGATIVE NEGATIVE   Nitrite NEGATIVE NEGATIVE   Leukocytes,Ua NEGATIVE NEGATIVE  Cervicovaginal ancillary only  Result Value Ref Range   Neisseria Gonorrhea Negative    Chlamydia Negative    Trichomonas Negative    Bacterial Vaginitis (gardnerella) Positive (A)    Candida Vaginitis Negative    Candida Glabrata Negative    Comment      Normal Reference Range Bacterial Vaginosis - Negative   Comment Normal Reference Range Candida Species - Negative    Comment Normal Reference Range Candida Galbrata - Negative    Comment Normal Reference Range Trichomonas - Negative    Comment Normal Reference Ranger Chlamydia - Negative    Comment      Normal Reference Range Neisseria Gonorrhea - Negative   Last labs reviewed, with the complete metabolic panel, A1c just completed on August 17. ECG check today was a sinus rhythm, with no concerning acute changes, and did compared to the previous EKG from 05/2016. Assessment & Plan:   1. Spell of decreased attentiveness 2.  Imbalance history with some prior dizzy episodes and questioned decreased hearing in her past Discussed concerns for possible sources of her symptoms, and there were many.  She has had no recurrence since this happened, nor she had any concerning symptoms develop  since.  Does not seem to be a cardiac source, and ECG done today was without concerning changes.  Her blood sugar check when she got home was normal, and she did have a normal breakfast that morning.  She also notes she stays well-hydrated.  Her blood pressure has been well controlled in the recent past.  She denied any one-sided symptoms of concern, with no more acute concerns for a CVA event, with a possible TIA noted.  Seizure is also in the differential.  More an absence seizure than a tonic-clonic event.  Discussed potential options, and she did see neurology in the not too distant past, and noting this occurring in conjunction with some of the prior symptoms noted that prompted an MRI evaluation, do feel returning to neurology and getting their opinion is appropriate. She was asked to call the office and make an appointment, and can provide a referral if needed as well. Emphasized if she has recurrence of these types of symptoms, having a more immediate follow-up, and certainly if has any concerning symptoms in association such as increased headaches, nausea or vomiting, one-sided symptoms of concern, she needs to head to the emergency room immediately and be seen and she was understanding of that.  3. Urinary frequency She notes the symptoms have improved with the oxybutynin, although did note the potential side effects of that medication.  Seems less likely as the source, and will continue the medicine presently given it has been helpful.  We will continue to monitor. Also doubt the likelihood that Cymbalta is the source, and continue to monitor  4. Diabetes mellitus type 2 in obese (HCC) Her blood sugar on arrival home was good, and it  seems to have been well controlled in the recent past.  Doubt hypoglycemic episode as a source.  5. Essential hypertension Blood pressures also have been fairly well controlled recent past.   Await further input from neurology, and continue to monitor presently. Also would keep her planned follow-up with Lavada Mesi in October for further follow-up.  And follow-up sooner as needed.     Jamelle Haring, MD 10/28/19 3:11 PM

## 2019-10-28 ENCOUNTER — Ambulatory Visit (INDEPENDENT_AMBULATORY_CARE_PROVIDER_SITE_OTHER): Payer: Medicare Other | Admitting: Internal Medicine

## 2019-10-28 ENCOUNTER — Encounter: Payer: Self-pay | Admitting: Internal Medicine

## 2019-10-28 ENCOUNTER — Other Ambulatory Visit: Payer: Self-pay

## 2019-10-28 VITALS — BP 118/68 | HR 91 | Temp 98.0°F | Resp 16 | Ht 64.0 in | Wt 164.4 lb

## 2019-10-28 DIAGNOSIS — R2689 Other abnormalities of gait and mobility: Secondary | ICD-10-CM

## 2019-10-28 DIAGNOSIS — I10 Essential (primary) hypertension: Secondary | ICD-10-CM

## 2019-10-28 DIAGNOSIS — E669 Obesity, unspecified: Secondary | ICD-10-CM

## 2019-10-28 DIAGNOSIS — E1169 Type 2 diabetes mellitus with other specified complication: Secondary | ICD-10-CM | POA: Diagnosis not present

## 2019-10-28 DIAGNOSIS — R6889 Other general symptoms and signs: Secondary | ICD-10-CM

## 2019-10-28 DIAGNOSIS — R350 Frequency of micturition: Secondary | ICD-10-CM | POA: Diagnosis not present

## 2019-10-28 NOTE — Patient Instructions (Addendum)
Please contact neurology, Dr. Sherryll Burger who has seen you previously to schedule follow-up appointment.   Stay well hydrated as you are doing also is important  I would keep the planned follow-up with Danelle Berry in October as well

## 2019-10-29 ENCOUNTER — Encounter: Payer: Self-pay | Admitting: Family Medicine

## 2019-10-31 DIAGNOSIS — R42 Dizziness and giddiness: Secondary | ICD-10-CM | POA: Diagnosis not present

## 2019-10-31 DIAGNOSIS — Z79899 Other long term (current) drug therapy: Secondary | ICD-10-CM | POA: Diagnosis not present

## 2019-10-31 DIAGNOSIS — R569 Unspecified convulsions: Secondary | ICD-10-CM | POA: Diagnosis not present

## 2019-10-31 DIAGNOSIS — R4020 Unspecified coma: Secondary | ICD-10-CM | POA: Diagnosis not present

## 2019-11-03 ENCOUNTER — Other Ambulatory Visit (HOSPITAL_COMMUNITY): Payer: Self-pay | Admitting: Neurology

## 2019-11-03 ENCOUNTER — Other Ambulatory Visit: Payer: Self-pay | Admitting: Neurology

## 2019-11-03 DIAGNOSIS — R4020 Unspecified coma: Secondary | ICD-10-CM

## 2019-11-03 DIAGNOSIS — R42 Dizziness and giddiness: Secondary | ICD-10-CM

## 2019-11-03 DIAGNOSIS — Z23 Encounter for immunization: Secondary | ICD-10-CM | POA: Diagnosis not present

## 2019-11-07 DIAGNOSIS — G459 Transient cerebral ischemic attack, unspecified: Secondary | ICD-10-CM | POA: Diagnosis not present

## 2019-11-07 DIAGNOSIS — R410 Disorientation, unspecified: Secondary | ICD-10-CM | POA: Diagnosis not present

## 2019-11-07 DIAGNOSIS — E119 Type 2 diabetes mellitus without complications: Secondary | ICD-10-CM | POA: Diagnosis not present

## 2019-11-07 DIAGNOSIS — R42 Dizziness and giddiness: Secondary | ICD-10-CM | POA: Diagnosis not present

## 2019-11-07 DIAGNOSIS — R0602 Shortness of breath: Secondary | ICD-10-CM | POA: Diagnosis not present

## 2019-11-07 DIAGNOSIS — I10 Essential (primary) hypertension: Secondary | ICD-10-CM | POA: Diagnosis not present

## 2019-11-07 DIAGNOSIS — E782 Mixed hyperlipidemia: Secondary | ICD-10-CM | POA: Diagnosis not present

## 2019-11-07 DIAGNOSIS — Z8673 Personal history of transient ischemic attack (TIA), and cerebral infarction without residual deficits: Secondary | ICD-10-CM | POA: Diagnosis not present

## 2019-11-10 ENCOUNTER — Other Ambulatory Visit: Payer: Self-pay | Admitting: Neurology

## 2019-11-10 DIAGNOSIS — R4020 Unspecified coma: Secondary | ICD-10-CM

## 2019-11-10 DIAGNOSIS — H93A3 Pulsatile tinnitus, bilateral: Secondary | ICD-10-CM

## 2019-11-11 NOTE — Progress Notes (Signed)
Patient ID: Sandy Salinas, female    DOB: Jan 27, 1951, 69 y.o.   MRN: [id number]  PCP: Danelle Berry, PA-C  Chief Complaint  Patient presents with  . Memory Loss    having blackout spells? already seen Cardio and Neuro waiting test results    Subjective:   Sandy Salinas is a 69 y.o. female, presents to clinic with CC of episode that happens about 2 weeks ago.  She states that [place] she became very disoriented and confused did not know where she was going.  She states she did not pass out and did not become dizzy.  She has followed up with cardiology and with neurology since this episode happened.  She saw Dr. Rexene Edison in clinic right after this episode happened: 10/28/2019 OV per Dr. Dorris Fetch documentation: Chief Complaint  Patient presents with  . Disoriented    This Sunday 9/19 she was driving down the road and then next thing she knew she was 30 minutes away from where she had last been "awake" and her lip was kind of numb    HPI:  Patient is a 69 year old female patient of Danelle Berry Called the office yesterday with the following message noted:             Pt reports one brief episode of disorientation yesterday when out driving. States episode lasted 30 minutes, "Drove around not recognizing where I was and I know those roads well." States resolved and was able to drive home. Denies any headache, no dizziness, no unilateral weakness or speech difficulties. "Noting going on with me at the time." States started 2 new meds mid August, Oxybutynin XR - 5mg  and Cymbalta 30mg . No narcotic or alcohol use. Has not had any other episodes or symptoms, Alert and oriented x 3.  Pt has appt 11/24/2019 with Francisca Harbuck. NT called practice, , for any earlier appt availability. Permission to schedule with Dr. 11/26/2019 tomorrow. Care advise given. Go to ED if symptoms reoccur. Pt verbalizes understanding.  Follows up today.  Patient has not had  any recurrent episodes.  She denies vertigo, passing out episodes, palpitations, shortness of breath, numbness, tingling, seizure-like activity.  Neurology has done a sleep deprived EEG and has scheduled her for MRI/MRA She previously had MRI about 6 months ago which did show ischemic disease -she was put on Plavix by Dr. Bjorn Loser of [facility] clinic She does have a family history of dementia/Alzheimer's in her mother and multiple aunts and uncles -  She also consulted with cardiology at [facility] for TIA and she was given a Holter monitor which she returned to cardiology 2 days ago she has not yet heard back from them but she has a follow-up appointment  Patient states that she could not get a refill of her Metformin she is taking 500 mg Metformin and twice daily She is current with her routine follow-up for diabetes last was done in August, rechecked with the pharmacy and with the chart and medications were refilled on September 29  Of note patient did check her sugar around the time of her episode and it was normal  No other new over-the-counter medications or prescription medicines other than Plavix.  For overactive bladder symptoms and urinary frequency she is on oxybutynin and it has been effective and she was unable to get Myrbetriq from the pharmacy   Patient Active Problem List   Diagnosis Date Noted  . Spells of decreased attentiveness 10/28/2019  .  Trochanteric bursitis of left hip 05/20/2019  . Gait abnormality 03/25/2019  . Vertigo 03/25/2019  . Microalbuminuria due to type 2 diabetes mellitus (HCC) 02/20/2018  . Obesity (BMI 30.0-34.9) 02/19/2017  . Breast calcification seen on mammogram 12/01/2016  . Age-related osteoporosis without current pathological fracture 11/08/2016  . 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 Abnormal EKG 05/12/2016  . 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 Xerosis of skin 12/13/2015    . Psoriatic arthritis (HCC) 11/18/2015  . Lumbar radiculopathy, acute 10/14/2015  . Vitamin D deficiency 08/18/2014  . Hyperlipidemia   . Hypertension   . Primary osteoarthritis of left knee   . Diabetes mellitus type 2 in obese (HCC) 07/24/2014  . Status post bariatric surgery 07/24/2014      Current Outpatient Medications:  .  atorvastatin (LIPITOR) 80 MG tablet, TAKE 1 TABLET (80 MG) BY MOUTH AT BEDTIME, Disp: 90 tablet, Rfl: 3 .  Calcium Carbonate-Vitamin D (CALTRATE 600+D PO), Take by mouth daily., Disp: , Rfl:  .  chlorthalidone (HYGROTON) 25 MG tablet, Take 1 tablet (25 mg total) by mouth daily., Disp: 90 tablet, Rfl: 3 .  Cholecalciferol (VITAMIN D) 2000 units tablet, One by mouth three or four days per week, Disp: , Rfl:  .  DULoxetine (CYMBALTA) 30 MG capsule, Take by mouth., Disp: , Rfl:  .  gabapentin (NEURONTIN) 100 MG capsule, Take by mouth., Disp: , Rfl:  .  lisinopril (ZESTRIL) 10 MG tablet, Take 1 tablet (10 mg total) by mouth daily., Disp: 90 tablet, Rfl: 3 .  Magnesium 100 MG TABS, Take by mouth., Disp: , Rfl:  .  metFORMIN (GLUCOPHAGE) 500 MG tablet, Take 1 tablet (500 mg total) by mouth 2 (two) times daily., Disp: 180 tablet, Rfl: 1 .  mirabegron ER (MYRBETRIQ) 25 MG TB24 tablet, Take 1 tablet (25 mg total) by mouth daily., Disp: 30 tablet, Rfl: 5 .  Multiple Vitamin (MULTIVITAMIN) tablet, Take 1 tablet by mouth daily., Disp: , Rfl:  .  oxybutynin (DITROPAN-XL) 5 MG 24 hr tablet, Take 1 tablet (5 mg total) by mouth at bedtime., Disp: 60 tablet, Rfl: 2 .  Potassium (GNP POTASSIUM) 99 MG TABS, Take by mouth., Disp: , Rfl:  .  TURMERIC PO, Take 1 capsule by mouth at bedtime., Disp: , Rfl:  .  vitamin B-12 (CYANOCOBALAMIN) 100 MCG tablet, Take by mouth daily., Disp: , Rfl:  .  vitamin E 100 UNIT capsule, Take by mouth daily. Reported on 08/19/2015, Disp: , Rfl:    Allergies  Allergen Reactions  . Codeine Nausea Only  . Secukinumab Other (See Comments)    Made her  throat raw for 2 months so is not taking this now     Social History   Tobacco Use  . Smoking status: Never Smoker  . Smokeless tobacco: Never Used  Vaping Use  . Vaping Use: Never used  Substance Use Topics  . Alcohol use: No  . Drug use: No      Chart Review Today: I personally reviewed active problem list, medication list, allergies, family history, social history, health maintenance, notes from last encounter, lab results, imaging with the patient/caregiver today.   Review of Systems 10 Systems reviewed and are negative for acute change except as noted in the HPI.     Objective:   Vitals:   11/12/19 1040  BP: 114/82  Pulse: 80  Resp: 16  Temp: 98.3 F (36.8 C)  SpO2: 99%  Weight: 166  lb 1.6 oz (75.3 kg)  Height: 5\' 4"  (1.626 m)    Body mass index is 28.51 kg/m.  Physical Exam Vitals and nursing note reviewed.  Constitutional:      General: She is not in acute distress.    Appearance: Normal appearance. She is not toxic-appearing or diaphoretic.  HENT:     Head: Normocephalic and atraumatic.     Right Ear: External ear normal.     Left Ear: External ear normal.  Eyes:     General:        Right eye: No discharge.        Left eye: No discharge.     Conjunctiva/sclera: Conjunctivae normal.  Cardiovascular:     Rate and Rhythm: Normal rate and regular rhythm.     Pulses: Normal pulses.     Heart sounds: Normal heart sounds. No murmur heard.  No friction rub. No gallop.   Pulmonary:     Effort: Pulmonary effort is normal. No respiratory distress.     Breath sounds: Normal breath sounds. No stridor. No wheezing or rales.  Musculoskeletal:     Cervical back: Normal range of motion.     Right lower leg: No edema.     Left lower leg: No edema.  Skin:    General: Skin is warm and dry.     Capillary Refill: Capillary refill takes less than 2 seconds.     Coloration: Skin is not jaundiced or pale.     Comments: Purpura to b/l forearms  Neurological:      Mental Status: She is alert.     Gait: Gait normal.  Psychiatric:        Mood and Affect: Mood normal.        Behavior: Behavior normal.      Results for orders placed or performed in visit on 09/23/19  Urine Culture   Specimen: Urine  Result Value Ref Range   MICRO NUMBER: [id number]    SPECIMEN QUALITY: Adequate    Sample Source URINE    STATUS: FINAL    Result: No Growth   Hemoglobin A1C  Result Value Ref Range   Hgb A1c MFr Bld 7.2 (H) <5.7 % of total Hgb   Mean Plasma Glucose 160 (calc)   eAG (mmol/L) 8.9 (calc)  COMPLETE METABOLIC PANEL WITH GFR  Result Value Ref Range   Glucose, Bld 101 (H) 65 - 99 mg/dL   BUN 13 7 - 25 mg/dL   Creat 5.400.68 9.810.50 - 1.910.99 mg/dL   GFR, Est Non African American 90 > OR = 60 mL/min/1.7873m2   GFR, Est African American 104 > OR = 60 mL/min/1.5573m2   BUN/Creatinine Ratio NOT APPLICABLE 6 - 22 (calc)   Sodium 137 135 - 146 mmol/L   Potassium 3.9 3.5 - 5.3 mmol/L   Chloride 98 98 - 110 mmol/L   CO2 31 20 - 32 mmol/L   Calcium 9.6 8.6 - 10.4 mg/dL   Total Protein 7.0 6.1 - 8.1 g/dL   Albumin 4.4 3.6 - 5.1 g/dL   Globulin 2.6 1.9 - 3.7 g/dL (calc)   AG Ratio 1.7 1.0 - 2.5 (calc)   Total Bilirubin 0.5 0.2 - 1.2 mg/dL   Alkaline phosphatase (APISO) 74 37 - 153 U/L   AST 47 (H) 10 - 35 U/L   ALT 31 (H) 6 - 29 U/L  Urinalysis, Routine w reflex microscopic  Result Value Ref Range   Color, Urine YELLOW YELLOW   APPearance CLEAR CLEAR  Specific Gravity, Urine 1.008 1.001 - 1.03   pH 6.5 5.0 - 8.0   Glucose, UA NEGATIVE NEGATIVE   Bilirubin Urine NEGATIVE NEGATIVE   Ketones, ur NEGATIVE NEGATIVE   Hgb urine dipstick NEGATIVE NEGATIVE   Protein, ur NEGATIVE NEGATIVE   Nitrite NEGATIVE NEGATIVE   Leukocytes,Ua NEGATIVE NEGATIVE  Cervicovaginal ancillary only  Result Value Ref Range   Neisseria Gonorrhea Negative    Chlamydia Negative    Trichomonas Negative    Bacterial Vaginitis (gardnerella) Positive (A)    Candida Vaginitis Negative     Candida Glabrata Negative    Comment      Normal Reference Range Bacterial Vaginosis - Negative   Comment Normal Reference Range Candida Species - Negative    Comment Normal Reference Range Candida Galbrata - Negative    Comment Normal Reference Range Trichomonas - Negative    Comment Normal Reference Ranger Chlamydia - Negative    Comment      Normal Reference Range Neisseria Gonorrhea - Negative       Assessment & Plan:     ICD-10-CM   1. Spells of decreased attentiveness  R68.89    Patient presents for follow-up on 1 episode of decreased attentiveness or altered mental status -work-up per neurology  2. Ischemic cerebrovascular disease  I67.82    per neuro- pending MRI and sleep deprived EEG, started plavix  3. Senile purpura (HCC)  D69.2    reassured pt, no concerns  4. Urinary frequency  R35.0    Improving symptoms on oxybutynin  5. Urge incontinence  N39.41    Improving symptoms on oxybutynin  6. Essential hypertension  I10    Blood pressure at goal, stable and well-controlled on lisinopril  7. Diabetes mellitus type 2 in obese [facility])  E11.69    E66.9    Well-controlled, ensured that patient have refills of her Metformin today    Encourage patient to ask neurology about possible neurocognitive evaluation with her family history of dementia/Alzheimer's I reviewed the chart thoroughly she is scheduled for MRI MRA, and there is extensive lab work done by neurology  If there are no findings per neurology or cardiology would consider careful scrutiny of her med list and to avoid medications which could cause neurocognitive side effects    Danelle Berry, PA-C 11/12/19 1:21 PM

## 2019-11-12 ENCOUNTER — Other Ambulatory Visit: Payer: Self-pay

## 2019-11-12 ENCOUNTER — Encounter: Payer: Self-pay | Admitting: Family Medicine

## 2019-11-12 ENCOUNTER — Ambulatory Visit (INDEPENDENT_AMBULATORY_CARE_PROVIDER_SITE_OTHER): Payer: Medicare Other | Admitting: Family Medicine

## 2019-11-12 VITALS — BP 114/82 | HR 80 | Temp 98.3°F | Resp 16 | Ht 64.0 in | Wt 166.1 lb

## 2019-11-12 DIAGNOSIS — W19XXXA Unspecified fall, initial encounter: Secondary | ICD-10-CM

## 2019-11-12 DIAGNOSIS — R350 Frequency of micturition: Secondary | ICD-10-CM | POA: Diagnosis not present

## 2019-11-12 DIAGNOSIS — I6782 Cerebral ischemia: Secondary | ICD-10-CM

## 2019-11-12 DIAGNOSIS — F0280 Dementia in other diseases classified elsewhere without behavioral disturbance: Secondary | ICD-10-CM

## 2019-11-12 DIAGNOSIS — N3941 Urge incontinence: Secondary | ICD-10-CM

## 2019-11-12 DIAGNOSIS — E669 Obesity, unspecified: Secondary | ICD-10-CM

## 2019-11-12 DIAGNOSIS — Z820 Family history of epilepsy and other diseases of the nervous system: Secondary | ICD-10-CM

## 2019-11-12 DIAGNOSIS — I10 Essential (primary) hypertension: Secondary | ICD-10-CM | POA: Diagnosis not present

## 2019-11-12 DIAGNOSIS — R6889 Other general symptoms and signs: Secondary | ICD-10-CM | POA: Diagnosis not present

## 2019-11-12 DIAGNOSIS — R0789 Other chest pain: Secondary | ICD-10-CM

## 2019-11-12 DIAGNOSIS — D692 Other nonthrombocytopenic purpura: Secondary | ICD-10-CM

## 2019-11-12 DIAGNOSIS — E1169 Type 2 diabetes mellitus with other specified complication: Secondary | ICD-10-CM

## 2019-11-12 NOTE — Patient Instructions (Addendum)
Metformin was refilled last week: Check with your pharmacy- they should have it [facility]: [address], [place]  They confirmed it on 9/29   Disp Refills Start End   metFORMIN (GLUCOPHAGE) 500 MG tablet 180 tablet 1 11/05/2018    Sig - Route: Take 1 tablet (500 mg total) by mouth 2 (two) times daily. - Oral   Sent to pharmacy as: metFORMIN (GLUCOPHAGE) 500 MG tablet   E-Prescribing Status: Receipt confirmed by pharmacy (11/05/2018 1:48 PM EDT)    I recommend you do not drive until cleared by neuro and cardiology

## 2019-11-17 DIAGNOSIS — G309 Alzheimer's disease, unspecified: Secondary | ICD-10-CM | POA: Insufficient documentation

## 2019-11-17 DIAGNOSIS — R350 Frequency of micturition: Secondary | ICD-10-CM | POA: Insufficient documentation

## 2019-11-17 DIAGNOSIS — Z820 Family history of epilepsy and other diseases of the nervous system: Secondary | ICD-10-CM | POA: Insufficient documentation

## 2019-11-17 DIAGNOSIS — N3941 Urge incontinence: Secondary | ICD-10-CM | POA: Insufficient documentation

## 2019-11-17 DIAGNOSIS — F0280 Dementia in other diseases classified elsewhere without behavioral disturbance: Secondary | ICD-10-CM | POA: Insufficient documentation

## 2019-11-17 MED ORDER — MIRABEGRON ER 25 MG PO TB24: 25.0000 mg | ORAL_TABLET | Freq: Every day | ORAL | 11 refills | Status: DC

## 2019-11-21 ENCOUNTER — Emergency Department
Admission: EM | Admit: 2019-11-21 | Discharge: 2019-11-21 | Disposition: A | Discharge status: Home / Self Care | Payer: Medicare Other | Attending: Student in an Organized Health Care Education/Training Program | Admitting: Student in an Organized Health Care Education/Training Program

## 2019-11-21 ENCOUNTER — Telehealth: Payer: Self-pay

## 2019-11-21 ENCOUNTER — Encounter: Payer: Self-pay | Admitting: Emergency Medicine

## 2019-11-21 ENCOUNTER — Other Ambulatory Visit: Payer: Self-pay

## 2019-11-21 ENCOUNTER — Ambulatory Visit: Payer: Self-pay | Admitting: *Deleted

## 2019-11-21 ENCOUNTER — Emergency Department: Payer: Medicare Other

## 2019-11-21 DIAGNOSIS — I10 Essential (primary) hypertension: Secondary | ICD-10-CM | POA: Diagnosis not present

## 2019-11-21 DIAGNOSIS — M47812 Spondylosis without myelopathy or radiculopathy, cervical region: Secondary | ICD-10-CM | POA: Diagnosis not present

## 2019-11-21 DIAGNOSIS — Z79899 Other long term (current) drug therapy: Secondary | ICD-10-CM | POA: Insufficient documentation

## 2019-11-21 DIAGNOSIS — M2578 Osteophyte, vertebrae: Secondary | ICD-10-CM | POA: Diagnosis not present

## 2019-11-21 DIAGNOSIS — M50223 Other cervical disc displacement at C6-C7 level: Secondary | ICD-10-CM | POA: Diagnosis not present

## 2019-11-21 DIAGNOSIS — S0003XA Contusion of scalp, initial encounter: Secondary | ICD-10-CM | POA: Diagnosis not present

## 2019-11-21 DIAGNOSIS — S0990XA Unspecified injury of head, initial encounter: Secondary | ICD-10-CM

## 2019-11-21 DIAGNOSIS — W108XXA Fall (on) (from) other stairs and steps, initial encounter: Secondary | ICD-10-CM | POA: Insufficient documentation

## 2019-11-21 DIAGNOSIS — S199XXA Unspecified injury of neck, initial encounter: Secondary | ICD-10-CM | POA: Diagnosis not present

## 2019-11-21 DIAGNOSIS — E119 Type 2 diabetes mellitus without complications: Secondary | ICD-10-CM | POA: Insufficient documentation

## 2019-11-21 DIAGNOSIS — Z7984 Long term (current) use of oral hypoglycemic drugs: Secondary | ICD-10-CM | POA: Diagnosis not present

## 2019-11-21 DIAGNOSIS — W19XXXA Unspecified fall, initial encounter: Secondary | ICD-10-CM

## 2019-11-21 NOTE — Telephone Encounter (Signed)
  Patient is calling to report she fell backwards on step and hit her head. She has swelling the size of golfball. Advised ED per protocol. Reason for Disposition . Large swelling or bruise > 2 inches (5 cm)  Answer Assessment - Initial Assessment Questions 1. MECHANISM: "How did the injury happen?" For falls, ask: "What height did you fall from?" and "What surface did you fall against?"      Fell on steps- standing, wooden floor 2. ONSET: "When did the injury happen?" (Minutes or hours ago)      12:10 3. NEUROLOGIC SYMPTOMS: "Was there any loss of consciousness?" "Are there any other neurological symptoms?"      No symptoms 4. MENTAL STATUS: "Does the person know who he is, who you are, and where he is?"      alert 5. LOCATION: "What part of the head was hit?"      Top of R side of haed 6. SCALP APPEARANCE: "What does the scalp look like? Is it bleeding now?" If Yes, ask: "Is it difficult to stop?"      No cuts- swelling 7. SIZE: For cuts, bruises, or swelling, ask: "How large is it?" (e.g., inches or centimeters)      Swelling- size of baseball 8. PAIN: "Is there any pain?" If Yes, ask: "How bad is it?"  (e.g., Scale 1-10; or mild, moderate, severe)     severe 9. TETANUS: For any breaks in the skin, ask: "When was the last tetanus booster?"     No breaks in skin 10. OTHER SYMPTOMS: "Do you have any other symptoms?" (e.g., neck pain, vomiting)     R  Hand pain, lip swollen 11. PREGNANCY: "Is there any chance you are pregnant?" "When was your last menstrual period?"       n/a  Protocols used: HEAD INJURY-A-AH

## 2019-11-21 NOTE — ED Notes (Signed)
See triage note. Pt c/o buttock, right side, head, and right hand pain. Swelling to back of head on left side noted. Pt denies blood thinners, denies LOC.

## 2019-11-21 NOTE — ED Triage Notes (Signed)
Pt to ER states she was getting something from the stairs and missed the last step falling to her buttock and then backwards hitting her head.  Denies LOC, no blood thinners.  Also states pain to right side.

## 2019-11-21 NOTE — Telephone Encounter (Signed)
I called patient and left message on voicemail to see if she went to Er and advise her to go to a UC or ER for injury

## 2019-11-21 NOTE — Telephone Encounter (Signed)
Copied from CRM [id number]. Topic: General - Call Back - No Documentation >> Nov 21, 2019  2:10 PM Randol Kern wrote: Dr. Alver Fisher cell [phone], he is requesting a call back from Dr. Dorris Fetch specifically at his earliest convenience. Dr. Clelia Croft is a neurologist at the [facility]

## 2019-11-21 NOTE — ED Provider Notes (Signed)
[facility] Emergency Department Provider Note  ____________________________________________  Time seen: Approximately 4:04 PM  I have reviewed the triage vital signs and the nursing notes.   HISTORY  Chief Complaint Fall    HPI Sandy Salinas is a 69 y.o. female that presents to the emergency department for evaluation after a mechanical fall today.  Patient was backwards on the last step of a staircase trying to pick up something when she fell backwards and landed on the floor.  She landed on her buttocks and hit the back of her head.  She did not lose consciousness.  She has a knot to the back of her head and a headache.  She is also sore to her left thumb, left rib cage, buttocks.  She does not feel that anything is broken.  She just overall feels a little sore.  She is not on any blood thinners.  She has been up walking.  No shortness of breath, chest pain, abdominal pain.   Past Medical History:  Diagnosis Date  . 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 Breast calcification seen on mammogram 12/01/2016   LEFT  . Diabetes mellitus without complication (HCC)   . Family history of abdominal aortic aneurysm (AAA) 05/12/2016   father  . Hyperlipidemia   . Hypertension   . Obesity   . Osteoporosis   . Primary osteoarthritis of left knee   . Psoriasis, unspecified     Patient Active Problem List   Diagnosis Date Noted  . Alzheimer's dementia without behavioral disturbance (HCC) 11/17/2019  . Family history of Alzheimer's disease 11/17/2019  . Urge incontinence 11/17/2019  . Urinary frequency 11/17/2019  . Spells of decreased attentiveness 10/28/2019  . Trochanteric bursitis of left hip 05/20/2019  . Gait abnormality 03/25/2019  . Vertigo 03/25/2019  . Microalbuminuria due to type 2 diabetes mellitus (HCC) 02/20/2018  . Obesity (BMI 30.0-34.9) 02/19/2017  . Breast calcification seen on  mammogram 12/01/2016  . Age-related osteoporosis without current pathological fracture 11/08/2016  . 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 Abnormal EKG 05/12/2016  . 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 Xerosis of skin 12/13/2015  . Psoriatic arthritis (HCC) 11/18/2015  . Lumbar radiculopathy, acute 10/14/2015  . Vitamin D deficiency 08/18/2014  . Hyperlipidemia   . Hypertension   . Primary osteoarthritis of left knee   . Diabetes mellitus type 2 in obese (HCC) 07/24/2014  . Status post bariatric surgery 07/24/2014    Past Surgical History:  Procedure Laterality Date  . ABDOMINAL HYSTERECTOMY  1975   total  . BREAST BIOPSY Right 04/09/2012   x 2, FIBROADENOMA WITH COARSE CALCIFICATIONS.done by byrnett  . BREAST BIOPSY Right 09/04/2017   Affirm Bx- X clip/neg  . CYSTECTOMY  2003  . ROUX-EN-Y GASTRIC BYPASS  2009  . TONSILLECTOMY  1970    Prior to Admission medications   Medication Sig Start Date End Date Taking? Authorizing Provider  atorvastatin (LIPITOR) 80 MG tablet TAKE 1 TABLET (80 MG) BY MOUTH AT BEDTIME 09/02/19   Danelle Berry, PA-C  Calcium Carbonate-Vitamin D (CALTRATE 600+D PO) Take by mouth daily.    [provider]  chlorthalidone (HYGROTON) 25 MG tablet Take 1 tablet (25 mg total) by mouth daily. 08/21/19   Danelle Berry, PA-C  Cholecalciferol (VITAMIN D) 2000 units tablet One by mouth three or four days per week 02/19/17   Baruch Gouty  P, MD  DULoxetine (CYMBALTA) 30 MG capsule Take by mouth. 09/22/19 09/21/20  [provider]  gabapentin (NEURONTIN) 100 MG capsule Take by mouth. 12/11/17   [provider]  lisinopril (ZESTRIL) 10 MG tablet Take 1 tablet (10 mg total) by mouth daily. 09/02/19   Danelle Berry, PA-C  Magnesium 100 MG TABS Take by mouth.    [provider]  metFORMIN (GLUCOPHAGE) 500 MG tablet Take 1 tablet (500 mg total) by mouth 2 (two) times daily.  11/05/18   Danelle Berry, PA-C  mirabegron ER (MYRBETRIQ) 25 MG TB24 tablet Take 1 tablet (25 mg total) by mouth daily. 11/17/19   Danelle Berry, PA-C  Multiple Vitamin (MULTIVITAMIN) tablet Take 1 tablet by mouth daily.    [provider]  Potassium (GNP POTASSIUM) 99 MG TABS Take by mouth.    [provider]  TURMERIC PO Take 1 capsule by mouth at bedtime.    [provider]  vitamin B-12 (CYANOCOBALAMIN) 100 MCG tablet Take by mouth daily.    [provider]  vitamin E 100 UNIT capsule Take by mouth daily. Reported on 08/19/2015    [provider]    Allergies Codeine and Secukinumab  Family History  Problem Relation Age of Onset  . Alzheimer's disease Mother   . Heart murmur Mother   . Dementia Mother   . AAA (abdominal aortic aneurysm) Father   . Heart disease Father   . Asthma Sister   . Hypertension Sister   . Cancer Sister        breast  . Breast cancer Sister        late 61's  . Heart disease Brother   . Hypertension Brother   . Aneurysm Paternal Aunt   . Cancer Paternal Grandmother        possible, not sure what kind  . Lung cancer Paternal Uncle   . Cancer Cousin     Social History Social History   Tobacco Use  . Smoking status: Never Smoker  . Smokeless tobacco: Never Used  Vaping Use  . Vaping Use: Never used  Substance Use Topics  . Alcohol use: No  . Drug use: No     Review of Systems  Cardiovascular: No chest pain. Respiratory: No SOB. Gastrointestinal: No abdominal pain.  No nausea, no vomiting.  Musculoskeletal: See HPI. Skin: Negative for rash, abrasions, lacerations, ecchymosis. Neurological: Negative for numbness or tingling. Positive for headache.   ____________________________________________   PHYSICAL EXAM:  VITAL SIGNS: ED Triage Vitals  Enc Vitals Group     BP 11/21/19 1401 (!) 166/69     Pulse Rate 11/21/19 1401 65     Resp 11/21/19 1401 18     Temp 11/21/19 1401 98.4 F (36.9 C)      Temp src --      SpO2 11/21/19 1401 98 %     Weight 11/21/19 1402 160 lb (72.6 kg)     Height 11/21/19 1402 5\' 4"  (1.626 m)     Head Circumference --      Peak Flow --      Pain Score 11/21/19 1402 8     Pain Loc --      Pain Edu? --      Excl. in GC? --      Constitutional: Alert and oriented. Well appearing and in no acute distress. Eyes: Conjunctivae are normal. PERRL. EOMI. Head: Mild swelling to right parietal scalp. ENT:      Ears:  Nose: No congestion/rhinnorhea.      Mouth/Throat: Mucous membranes are moist.  Neck: No stridor. No cervical spine tenderness to palpation. Cardiovascular: Normal rate, regular rhythm.  Good peripheral circulation. Respiratory: Normal respiratory effort without tachypnea or retractions. Lungs CTAB. Good air entry to the bases with no decreased or absent breath sounds. Gastrointestinal: Bowel sounds 4 quadrants. Soft and nontender to palpation. No guarding or rigidity. No palpable masses. No distention. Musculoskeletal: Full range of motion to all extremities. No gross deformities appreciated. Normal gait. Neurologic:  Normal speech and language. No gross focal neurologic deficits are appreciated.  Skin:  Skin is warm, dry and intact. No rash noted. Psychiatric: Mood and affect are normal. Speech and behavior are normal. Patient exhibits appropriate insight and judgement.   ____________________________________________   LABS (all labs ordered are listed, but only abnormal results are displayed)  Labs Reviewed - No data to display ____________________________________________  EKG   ____________________________________________  RADIOLOGY  CT Head Wo Contrast  Result Date: 11/21/2019 CLINICAL DATA:  Fall hitting her head. Denies loss of consciousness. EXAM: CT HEAD WITHOUT CONTRAST CT CERVICAL SPINE WITHOUT CONTRAST TECHNIQUE: Multidetector CT imaging of the head and cervical spine was performed following the standard protocol  without intravenous contrast. Multiplanar CT image reconstructions of the cervical spine were also generated. COMPARISON:  Brain MRI, 05/28/2019 FINDINGS: CT HEAD FINDINGS Brain: No evidence of acute infarction, hemorrhage, hydrocephalus, extra-axial collection or mass lesion/mass effect. There are patchy areas of white matter hypoattenuation consistent with mild chronic microvascular ischemic change, stable from brain MRI. Vascular: No hyperdense vessel or unexpected calcification. Skull: Normal. Negative for fracture or focal lesion. Sinuses/Orbits: Visualized globes and orbits are unremarkable. The visualized sinuses and mastoid air cells are clear. Other: Right parietal scalp hematoma. CT CERVICAL SPINE FINDINGS Alignment: Straightened cervical lordosis. No spondylolisthesis/subluxation. Skull base and vertebrae: No acute fracture. No primary bone lesion or focal pathologic process. Soft tissues and spinal canal: No prevertebral fluid or swelling. No visible canal hematoma. Disc levels: Mild to moderate loss of disc height with endplate irregularity and sclerosis, osteophytes and diffuse spondylotic disc bulging at C6-C7. Remaining cervical discs well preserved in height. No convincing disc herniation. Are facet degenerative changes, greatest on the right at C4-C5 and on the left at C5-C6. Upper chest: No acute findings.  Clear lung apices. Other: None. IMPRESSION: HEAD CT 1. No acute intracranial abnormalities. 2. No skull fracture.  Right parietal scalp hematoma. CERVICAL CT 1. No fracture or acute finding. Electronically Signed   By: Amie Portland M.D.   On: 11/21/2019 16:27   CT Cervical Spine Wo Contrast  Result Date: 11/21/2019 CLINICAL DATA:  Fall hitting her head. Denies loss of consciousness. EXAM: CT HEAD WITHOUT CONTRAST CT CERVICAL SPINE WITHOUT CONTRAST TECHNIQUE: Multidetector CT imaging of the head and cervical spine was performed following the standard protocol without intravenous contrast.  Multiplanar CT image reconstructions of the cervical spine were also generated. COMPARISON:  Brain MRI, 05/28/2019 FINDINGS: CT HEAD FINDINGS Brain: No evidence of acute infarction, hemorrhage, hydrocephalus, extra-axial collection or mass lesion/mass effect. There are patchy areas of white matter hypoattenuation consistent with mild chronic microvascular ischemic change, stable from brain MRI. Vascular: No hyperdense vessel or unexpected calcification. Skull: Normal. Negative for fracture or focal lesion. Sinuses/Orbits: Visualized globes and orbits are unremarkable. The visualized sinuses and mastoid air cells are clear. Other: Right parietal scalp hematoma. CT CERVICAL SPINE FINDINGS Alignment: Straightened cervical lordosis. No spondylolisthesis/subluxation. Skull base and vertebrae: No acute fracture. No primary  bone lesion or focal pathologic process. Soft tissues and spinal canal: No prevertebral fluid or swelling. No visible canal hematoma. Disc levels: Mild to moderate loss of disc height with endplate irregularity and sclerosis, osteophytes and diffuse spondylotic disc bulging at C6-C7. Remaining cervical discs well preserved in height. No convincing disc herniation. Are facet degenerative changes, greatest on the right at C4-C5 and on the left at C5-C6. Upper chest: No acute findings.  Clear lung apices. Other: None. IMPRESSION: HEAD CT 1. No acute intracranial abnormalities. 2. No skull fracture.  Right parietal scalp hematoma. CERVICAL CT 1. No fracture or acute finding. Electronically Signed   By: Amie Portlandavid  Ormond M.D.   On: 11/21/2019 16:27    ____________________________________________    PROCEDURES  Procedure(s) performed:    Procedures    Medications - No data to display   ____________________________________________   INITIAL IMPRESSION / ASSESSMENT AND PLAN / ED COURSE  Pertinent labs & imaging results that were available during my care of the patient were reviewed by me and  considered in my medical decision making (see chart for details).  Review of the  [place] was performed in accordance of the [facility] prior to dispensing any controlled drugs.   Patient presented to the emergency department for evaluation after a fall.  Vital signs and exam are reassuring.  CT head and cervical spine are negative for acute abnormalities.  Patient is to follow up with PCP as directed. Patient is given ED precautions to return to the ED for any worsening or new symptoms.   Sandy Salinas was evaluated in Emergency Department on 11/21/2019 for the symptoms described in the history of present illness. She was evaluated in the context of the global COVID-19 pandemic, which necessitated consideration that the patient might be at risk for infection with the SARS-CoV-2 virus that causes COVID-19. Institutional protocols and algorithms that pertain to the evaluation of patients at risk for COVID-19 are in a state of rapid change based on information released by regulatory bodies including the CDC and federal and state organizations. These policies and algorithms were followed during the patient's care in the ED.  ____________________________________________  FINAL CLINICAL IMPRESSION(S) / ED DIAGNOSES  Final diagnoses:  Fall, initial encounter  Injury of head, initial encounter      NEW MEDICATIONS STARTED DURING THIS VISIT:  ED Discharge Orders    None          This chart was dictated using voice recognition software/Dragon. Despite best efforts to proofread, errors can occur which can change the meaning. Any change was purely unintentional.    Enid DerryWagner, Glynn Yepes, PA-C 11/21/19 1942    Willy Eddyobinson, Patrick, MD 11/22/19 [phone]

## 2019-11-22 ENCOUNTER — Other Ambulatory Visit: Payer: Self-pay | Admitting: Family Medicine

## 2019-11-22 DIAGNOSIS — E1129 Type 2 diabetes mellitus with other diabetic kidney complication: Secondary | ICD-10-CM

## 2019-11-24 ENCOUNTER — Ambulatory Visit: Payer: Medicare Other | Admitting: Family Medicine

## 2019-11-24 NOTE — Telephone Encounter (Signed)
I called Dr. Sherryll Burger today, upon seeing msg, left him VM and my cell to call me back regarding mutual pt.  11/24/19 3:47 PM  Danelle Berry, PA-C

## 2019-11-25 ENCOUNTER — Other Ambulatory Visit: Payer: Self-pay

## 2019-11-25 ENCOUNTER — Ambulatory Visit
Admission: RE | Admit: 2019-11-25 | Discharge: 2019-11-25 | Disposition: A | Discharge status: Home / Self Care | Payer: Medicare Other | Source: Ambulatory Visit | Attending: Neurology | Admitting: Neurology

## 2019-11-25 DIAGNOSIS — H93A3 Pulsatile tinnitus, bilateral: Secondary | ICD-10-CM

## 2019-11-25 DIAGNOSIS — Z043 Encounter for examination and observation following other accident: Secondary | ICD-10-CM | POA: Diagnosis not present

## 2019-11-25 DIAGNOSIS — R4020 Unspecified coma: Secondary | ICD-10-CM | POA: Diagnosis not present

## 2019-11-25 NOTE — Telephone Encounter (Signed)
Will do, This is one that I do not have yet.

## 2019-11-27 ENCOUNTER — Ambulatory Visit
Admission: RE | Admit: 2019-11-27 | Discharge: 2019-11-27 | Disposition: A | Discharge status: Home / Self Care | Payer: Medicare Other | Source: Ambulatory Visit | Attending: Family Medicine | Admitting: Family Medicine

## 2019-11-27 ENCOUNTER — Ambulatory Visit
Admission: RE | Admit: 2019-11-27 | Discharge: 2019-11-27 | Disposition: A | Discharge status: Home / Self Care | Payer: Medicare Other | Attending: Family Medicine | Admitting: Family Medicine

## 2019-11-27 ENCOUNTER — Other Ambulatory Visit: Payer: Self-pay

## 2019-11-27 DIAGNOSIS — W19XXXA Unspecified fall, initial encounter: Secondary | ICD-10-CM

## 2019-11-27 DIAGNOSIS — R0789 Other chest pain: Secondary | ICD-10-CM

## 2019-11-27 NOTE — Addendum Note (Signed)
Addended by: Danelle Berry on: 11/27/2019 01:03 PM   Modules accepted: Orders

## 2019-11-28 ENCOUNTER — Encounter: Payer: Self-pay | Admitting: Internal Medicine

## 2019-11-28 ENCOUNTER — Ambulatory Visit (INDEPENDENT_AMBULATORY_CARE_PROVIDER_SITE_OTHER): Payer: Medicare Other | Admitting: Internal Medicine

## 2019-11-28 VITALS — BP 130/76 | HR 77 | Temp 97.9°F | Resp 16 | Ht 64.0 in | Wt 161.6 lb

## 2019-11-28 DIAGNOSIS — R0789 Other chest pain: Secondary | ICD-10-CM | POA: Diagnosis not present

## 2019-11-28 DIAGNOSIS — S20211A Contusion of right front wall of thorax, initial encounter: Secondary | ICD-10-CM

## 2019-11-28 NOTE — Progress Notes (Addendum)
Patient ID: Sandy Salinas, female    DOB: [birth year], 69 y.o.   MRN: [id number]  PCP: Danelle Berry, PA-C  Chief Complaint  Patient presents with  . Chest Pain    Subjective:   Sandy Salinas is a 69 y.o. female, presents to clinic with CC of the following:  Chief Complaint  Patient presents with  . Chest Pain    HPI:  Patient is a 69 year old female patient of Danelle Berry She was seen in the emergency room on 11/21/2019 after a fall. She had a follow-up MRI done, with neurology reviewing and the results as follows:  IMPRESSION:  MRI head:   1. No acute intracranial abnormality.  2. Similar moderate scattered T2/FLAIR hyperintensities within the  white matter. Most likely these relate to chronic microvascular  ischemic disease given the patient's age. Additional differential  considerations include the sequela of prior migraines, trauma,  inflammation, or demyelination.   MRA head:   1. No acute abnormality.  2. No evidence of significant (greater than 50%) stenosis or  proximal occlusion.   She had noted persistence of some right-sided chest discomfort since the fall, and x-rays of the ribs ordered by Danelle Berry and obtained yesterday, with follow-up today.  The patient today noted that she fell backwards down the steps on Friday and presented to the emergency room after, with that note reviewed. She still has had pain in her right lower rib cage region since this occurred, feeling it beneath the left breast and beneath the breast more diffusely than 1 focal area of pain.  She notes it worsens with cough, feels a pressure at times with deep breaths, but not painful with breathing.  Denies any shortness of breath or heart racing concerns.  No left-sided chest pains.  No fevers. She had trouble sleeping last night, as noted more painful when on her right side. She tried Tylenol this morning, may have helped some.  She avoids ibuprofen and aspirin products  after she had gastric bypass years ago, states she does not do muscle relaxers, and notes with codeine she has nausea and feels sick.  She denies any abdominal pains.  No problems urinating.  At times she feels pain around in her upper back/scapular region on the right.   Patient Active Problem List   Diagnosis Date Noted  . Alzheimer's dementia without behavioral disturbance (HCC) 11/17/2019  . Family history of Alzheimer's disease 11/17/2019  . Urge incontinence 11/17/2019  . Urinary frequency 11/17/2019  . Spells of decreased attentiveness 10/28/2019  . Trochanteric bursitis of left hip 05/20/2019  . Gait abnormality 03/25/2019  . Vertigo 03/25/2019  . Microalbuminuria due to type 2 diabetes mellitus (HCC) 02/20/2018  . Obesity (BMI 30.0-34.9) 02/19/2017  . Breast calcification seen on mammogram 12/01/2016  . Age-related osteoporosis without current pathological fracture 11/08/2016  . 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 Abnormal EKG 05/12/2016  . 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 Xerosis of skin 12/13/2015  . Psoriatic arthritis (HCC) 11/18/2015  . Lumbar radiculopathy, acute 10/14/2015  . Vitamin D deficiency 08/18/2014  . Hyperlipidemia   . Hypertension   . Primary osteoarthritis of left knee   . Diabetes mellitus type 2 in obese (HCC) 07/24/2014  . Status post bariatric surgery 07/24/2014      Current Outpatient Medications:  .  atorvastatin (LIPITOR) 80 MG tablet, TAKE 1 TABLET (80 MG) BY MOUTH AT BEDTIME, Disp: 90 tablet, Rfl:  3 .  Calcium Carbonate-Vitamin D (CALTRATE 600+D PO), Take by mouth daily., Disp: , Rfl:  .  chlorthalidone (HYGROTON) 25 MG tablet, Take 1 tablet (25 mg total) by mouth daily., Disp: 90 tablet, Rfl: 3 .  Cholecalciferol (VITAMIN D) 2000 units tablet, One by mouth three or four days per week, Disp: , Rfl:  .  DULoxetine (CYMBALTA) 30 MG capsule, Take by mouth., Disp: , Rfl:    .  gabapentin (NEURONTIN) 100 MG capsule, Take by mouth., Disp: , Rfl:  .  lisinopril (ZESTRIL) 10 MG tablet, Take 1 tablet (10 mg total) by mouth daily., Disp: 90 tablet, Rfl: 3 .  Magnesium 100 MG TABS, Take by mouth., Disp: , Rfl:  .  metFORMIN (GLUCOPHAGE) 500 MG tablet, TAKE 1 TABLET(500 MG) BY MOUTH TWICE DAILY, Disp: 180 tablet, Rfl: 1 .  mirabegron ER (MYRBETRIQ) 25 MG TB24 tablet, Take 1 tablet (25 mg total) by mouth daily., Disp: 30 tablet, Rfl: 11 .  Multiple Vitamin (MULTIVITAMIN) tablet, Take 1 tablet by mouth daily., Disp: , Rfl:  .  Potassium (GNP POTASSIUM) 99 MG TABS, Take by mouth., Disp: , Rfl:  .  TURMERIC PO, Take 1 capsule by mouth at bedtime., Disp: , Rfl:  .  vitamin B-12 (CYANOCOBALAMIN) 100 MCG tablet, Take by mouth daily., Disp: , Rfl:  .  vitamin E 100 UNIT capsule, Take by mouth daily. Reported on 08/19/2015, Disp: , Rfl:    Allergies  Allergen Reactions  . Codeine Nausea Only  . Secukinumab Other (See Comments)    Made her throat raw for 2 months so is not taking this now     Past Surgical History:  Procedure Laterality Date  . ABDOMINAL HYSTERECTOMY  1975   total  . BREAST BIOPSY Right 04/09/2012   x 2, FIBROADENOMA WITH COARSE CALCIFICATIONS.done by byrnett  . BREAST BIOPSY Right 09/04/2017   Affirm Bx- X clip/neg  . CYSTECTOMY  2003  . ROUX-EN-Y GASTRIC BYPASS  2009  . TONSILLECTOMY  1970     Family History  Problem Relation Age of Onset  . Alzheimer's disease Mother   . Heart murmur Mother   . Dementia Mother   . AAA (abdominal aortic aneurysm) Father   . Heart disease Father   . Asthma Sister   . Hypertension Sister   . Cancer Sister        breast  . Breast cancer Sister        late 80's  . Heart disease Brother   . Hypertension Brother   . Aneurysm Paternal Aunt   . Cancer Paternal Grandmother        possible, not sure what kind  . Lung cancer Paternal Uncle   . Cancer Cousin      Social History   Tobacco Use  . Smoking  status: Never Smoker  . Smokeless tobacco: Never Used  Substance Use Topics  . Alcohol use: No    With staff assistance, above reviewed with the patient today.  ROS: As per HPI, otherwise no specific complaints on a limited and focused system review   No results found for this or any previous visit (from the past 72 hour(s)).   PHQ2/9: Depression screen [facility] 2/9 11/28/2019 11/12/2019 10/28/2019 09/23/2019 08/19/2019  Decreased Interest 0 0 0 0 0  Down, Depressed, Hopeless 0 0 0 0 0  PHQ - 2 Score 0 0 0 0 0  Altered sleeping - 0 - 0 -  Tired, decreased energy - 0 - 0 -  Change in appetite - 0 - 0 -  Feeling bad or failure about yourself  - 0 - 0 -  Trouble concentrating - 0 - 0 -  Moving slowly or fidgety/restless - 0 - 0 -  Suicidal thoughts - 0 - 0 -  PHQ-9 Score - 0 - 0 -  Difficult doing work/chores - Not difficult at all - Not difficult at all -  Some recent data might be hidden   PHQ-2/9 Result is neg   Fall Risk: Fall Risk  11/28/2019 11/12/2019 10/28/2019 09/23/2019 08/19/2019  Falls in the past year? 1 0 0 0 0  Number falls in past yr: 0 0 0 0 0  Injury with Fall? 1 0 0 0 0  Risk for fall due to : - - - - No Fall Risks  Follow up - - Falls evaluation completed Falls evaluation completed Falls prevention discussed      Objective:   Vitals:   11/28/19 1407  BP: 130/76  Pulse: 77  Resp: 16  Temp: 97.9 F (36.6 C)  TempSrc: Oral  SpO2: 99%  Weight: 161 lb 9.6 oz (73.3 kg)  Height:  (1.626 m)    Body mass index is 27.74 kg/m.  Physical Exam   NAD, masked, pleasant, not ill-appearing HEENT - [place]/AT, sclera anicteric, PERRL,conj - non-inj'ed, pharynx clear Neck - supple, no rigidity Car - RRR without m/g/r, not tachycardic Pulm- RR and effort normal at rest, CTA without wheeze or rales, good air movement throughout. Chest-tender to palpation of the chest wall at the right upper costal margin extending diffusely beneath the breast, and extending towards the  axillary line.  No one focal area of tenderness with palpation, and most tender beneath the level of the breast towards the costal margin.  No crepitus, no obvious rib disruption, no bruising or swelling noted, less tender palpating the chest wall above the breast.  Abd - soft, NT, ND, BS+, a linear scratch abrasion was present on the right abdomen, (she noted from her cat) Back - no CVA tenderness, nontender over the thoracic/lumbar spine with palpation, minimally tender beneath the right scapula and up towards the infrascapular region, not marked. Neuro/psychiatric - affect was not flat, appropriate with conversation  Alert and oriented  Grossly non-focal   Speech normal   Results for orders placed or performed in visit on 09/23/19  Urine Culture   Specimen: Urine  Result Value Ref Range   MICRO NUMBER: [id number]    SPECIMEN QUALITY: Adequate    Sample Source URINE    STATUS: FINAL    Result: No Growth   Hemoglobin A1C  Result Value Ref Range   Hgb A1c MFr Bld 7.2 (H) <5.7 % of total Hgb   Mean Plasma Glucose 160 (calc)   eAG (mmol/L) 8.9 (calc)  COMPLETE METABOLIC PANEL WITH GFR  Result Value Ref Range   Glucose, Bld 101 (H) 65 - 99 mg/dL   BUN 13 7 - 25 mg/dL   Creat 5.40 9.81 - 1.91 mg/dL   GFR, Est Non African American 90 > OR = 60 mL/min/1.10m2   GFR, Est African American 104 > OR = 60 mL/min/1.25m2   BUN/Creatinine Ratio NOT APPLICABLE 6 - 22 (calc)   Sodium 137 135 - 146 mmol/L   Potassium 3.9 3.5 - 5.3 mmol/L   Chloride 98 98 - 110 mmol/L   CO2 31 20 - 32 mmol/L   Calcium 9.6 8.6 - 10.4 mg/dL   Total Protein 7.0 6.1 -  8.1 g/dL   Albumin 4.4 3.6 - 5.1 g/dL   Globulin 2.6 1.9 - 3.7 g/dL (calc)   AG Ratio 1.7 1.0 - 2.5 (calc)   Total Bilirubin 0.5 0.2 - 1.2 mg/dL   Alkaline phosphatase (APISO) 74 37 - 153 U/L   AST 47 (H) 10 - 35 U/L   ALT 31 (H) 6 - 29 U/L  Urinalysis, Routine w reflex microscopic  Result Value Ref Range   Color, Urine YELLOW YELLOW   APPearance  CLEAR CLEAR   Specific Gravity, Urine 1.008 1.001 - 1.03   pH 6.5 5.0 - 8.0   Glucose, UA NEGATIVE NEGATIVE   Bilirubin Urine NEGATIVE NEGATIVE   Ketones, ur NEGATIVE NEGATIVE   Hgb urine dipstick NEGATIVE NEGATIVE   Protein, ur NEGATIVE NEGATIVE   Nitrite NEGATIVE NEGATIVE   Leukocytes,Ua NEGATIVE NEGATIVE  Cervicovaginal ancillary only  Result Value Ref Range   Neisseria Gonorrhea Negative    Chlamydia Negative    Trichomonas Negative    Bacterial Vaginitis (gardnerella) Positive (A)    Candida Vaginitis Negative    Candida Glabrata Negative    Comment      Normal Reference Range Bacterial Vaginosis - Negative   Comment Normal Reference Range Candida Species - Negative    Comment Normal Reference Range Candida Galbrata - Negative    Comment Normal Reference Range Trichomonas - Negative    Comment Normal Reference Ranger Chlamydia - Negative    Comment      Normal Reference Range Neisseria Gonorrhea - Negative   And x-ray obtained yesterday was not yet read and awaiting results    Assessment & Plan:    1. Other chest pain -right-sided 2. Contusion of right chest wall, initial encounter Discussed with patient that the x-ray results have not yet returned.  Do feel has a chest wall/rib contusion, and even if there is a subtle nondisplaced fracture or hairline fracture of concern, would not significantly change management.  Do feel the lungs are clear with good air movement, and doubt any pneumothorax concern as discussed. She does not like to take stronger pain medicines, and discussed those today, and noted a product like tramadol may be helpful, although she does have a concern with codeine in the past, and wanted to hold off on that. We will continue extra strength Tylenol-and can take up to 1000 mg 4 times a day as needed for discomfort.  She has not been taking any significant amount in the recent past Also recommended cold topically, at 10 to 15-minute intervals as that can  be helpful Await the x-ray results, and hope to have back within the next 24 hours. If there are concerns from those x-ray results, she will be notified. Noted it does take time for this to slowly heal, as cannot immobilize the chest wall like he can with an extremity injury.  If symptoms not improving or more problematic, needs to follow-up, and if more acutely develops shortness of breath, heart racing episodes, passing out feelings, she needs to be seen again more emergently in an emergency setting  Await x-ray results presently.     Jamelle Haring, MD 11/28/19 2:17 PM  Addendum: The x-ray results were as follows:  FINDINGS: Rib detail is somewhat limited, probably due to habitus. Possible acute right fifth through seventh anterolateral rib fractures. No pneumothorax.  IMPRESSION: Possible acute right fifth through seventh anterolateral rib Fractures.  Above noted possibility of rib fractures with difficulty interpreting as noted. No PTX. Feel best  to follow how she is doing clinically, and if sx's not improving, may repeat x-ray and to consider CT scan to help further clarify rib status. Patient notified by phone call of this results - left VM (at 0800 on 11/29/19).   [facility]

## 2019-11-28 NOTE — Patient Instructions (Signed)
Can take 2 extra strength Tylenol up to 4 times a day as needed for discomfort  Also can apply ice topically at 10 to 15-minute intervals to help.

## 2019-11-29 ENCOUNTER — Encounter: Payer: Self-pay | Admitting: Family Medicine

## 2019-11-29 ENCOUNTER — Encounter: Payer: Self-pay | Admitting: Internal Medicine

## 2019-12-03 DIAGNOSIS — R0602 Shortness of breath: Secondary | ICD-10-CM | POA: Diagnosis not present

## 2019-12-11 DIAGNOSIS — Y92009 Unspecified place in unspecified non-institutional (private) residence as the place of occurrence of the external cause: Secondary | ICD-10-CM | POA: Diagnosis not present

## 2019-12-11 DIAGNOSIS — R410 Disorientation, unspecified: Secondary | ICD-10-CM | POA: Diagnosis not present

## 2019-12-11 DIAGNOSIS — I10 Essential (primary) hypertension: Secondary | ICD-10-CM | POA: Diagnosis not present

## 2019-12-11 DIAGNOSIS — R0609 Other forms of dyspnea: Secondary | ICD-10-CM | POA: Diagnosis not present

## 2019-12-11 DIAGNOSIS — W010XXA Fall on same level from slipping, tripping and stumbling without subsequent striking against object, initial encounter: Secondary | ICD-10-CM | POA: Diagnosis not present

## 2019-12-11 DIAGNOSIS — F0390 Unspecified dementia without behavioral disturbance: Secondary | ICD-10-CM | POA: Diagnosis not present

## 2019-12-11 DIAGNOSIS — Z8673 Personal history of transient ischemic attack (TIA), and cerebral infarction without residual deficits: Secondary | ICD-10-CM | POA: Diagnosis not present

## 2019-12-11 DIAGNOSIS — E782 Mixed hyperlipidemia: Secondary | ICD-10-CM | POA: Diagnosis not present

## 2019-12-11 DIAGNOSIS — R42 Dizziness and giddiness: Secondary | ICD-10-CM | POA: Diagnosis not present

## 2019-12-11 DIAGNOSIS — E119 Type 2 diabetes mellitus without complications: Secondary | ICD-10-CM | POA: Diagnosis not present

## 2019-12-15 DIAGNOSIS — G459 Transient cerebral ischemic attack, unspecified: Secondary | ICD-10-CM | POA: Diagnosis not present

## 2019-12-23 DIAGNOSIS — M47816 Spondylosis without myelopathy or radiculopathy, lumbar region: Secondary | ICD-10-CM | POA: Diagnosis not present

## 2019-12-23 DIAGNOSIS — M7062 Trochanteric bursitis, left hip: Secondary | ICD-10-CM | POA: Diagnosis not present

## 2019-12-23 DIAGNOSIS — M8949 Other hypertrophic osteoarthropathy, multiple sites: Secondary | ICD-10-CM | POA: Diagnosis not present

## 2020-01-05 DIAGNOSIS — R42 Dizziness and giddiness: Secondary | ICD-10-CM | POA: Diagnosis not present

## 2020-01-05 DIAGNOSIS — R569 Unspecified convulsions: Secondary | ICD-10-CM | POA: Diagnosis not present

## 2020-02-26 ENCOUNTER — Telehealth: Payer: Self-pay

## 2020-02-26 NOTE — Telephone Encounter (Signed)
Copied from CRM [id number]. Topic: General - Other >> Feb 26, 2020 10:53 AM Daphine Deutscher D wrote: Reason for CRM: PA dept at [facility]  is going to be faxing a PA on her Myrbetriq .  Pt wanted to let the office know to be on the look out for it.

## 2020-02-26 NOTE — Telephone Encounter (Signed)
Patient is calling to request a call back for regarding the PA. Patient is stating that Sandy Salinas is requesting the office to call rather than faxing the PA for Myrbetriq.  [phone] Confirmation Code- [id number]   3 month supply for $30 for Myrbetriq

## 2020-03-04 DIAGNOSIS — R569 Unspecified convulsions: Secondary | ICD-10-CM | POA: Diagnosis not present

## 2020-03-25 ENCOUNTER — Ambulatory Visit: Payer: Medicare Other | Admitting: Family Medicine

## 2020-04-01 ENCOUNTER — Telehealth: Payer: Self-pay | Admitting: Family Medicine

## 2020-04-01 ENCOUNTER — Ambulatory Visit: Payer: Self-pay | Admitting: *Deleted

## 2020-04-01 NOTE — Telephone Encounter (Signed)
Pt called in c/o having a dry hacking cough for a week.   It's worse at night.  Home covid test on Mon. Was negative.  She is not having any other symptoms.  Just the dry cough which is really bothering her at night.  Her sister passed away last week and pt has been cleaning out the house.   She's been exposed to a lot of dust last weekend.  She has tried OTC antihistamines, OTC cough medication Delsym that the pharmacist recommended she try along with an antihistamine.  She has an appt on 04/13/2020 which was the soonest appt available but is wanting recommendations for cough medicine until her appt since she has tried OTC medications without relief.  I sent her request to [facility]. She can be reached at [phone].  Reason for Disposition . [1] Continuous (nonstop) coughing interferes with work or school AND [2] no improvement using cough treatment per protocol    Dry hacking cough.   Worse at night  covid test on Mon negative  Answer Assessment - Initial Assessment Questions 1. ONSET: "When did the cough begin?"      I have a dry hacking cough for a week.   Night it's worse.  Did a home covid test on Monday. 2. SEVERITY: "How bad is the cough today?"      It's worse at night  Last weekend I was cleaning out my sister's house after she died. There was a lot of dust.    3. SPUTUM: "Describe the color of your sputum" (none, dry cough; clear, white, yellow, green)     None 4. HEMOPTYSIS: "Are you coughing up any blood?" If so ask: "How much?" (flecks, streaks, tablespoons, etc.)     No 5. DIFFICULTY BREATHING: "Are you having difficulty breathing?" If Yes, ask: "How bad is it?" (e.g., mild, moderate, severe)    - MILD: No SOB at rest, mild SOB with walking, speaks normally in sentences, can lay down, no retractions, pulse < 100.    - MODERATE: SOB at rest, SOB with minimal exertion and prefers to sit, cannot lie down flat, speaks in phrases, mild retractions, audible  wheezing, pulse 100-120.    - SEVERE: Very SOB at rest, speaks in single words, struggling to breathe, sitting hunched forward, retractions, pulse > 120      None 6. FEVER: "Do you have a fever?" If Yes, ask: "What is your temperature, how was it measured, and when did it start?"     No 7. CARDIAC HISTORY: "Do you have any history of heart disease?" (e.g., heart attack, congestive heart failure)      No 8. LUNG HISTORY: "Do you have any history of lung disease?"  (e.g., pulmonary embolus, asthma, emphysema)     No 9. PE RISK FACTORS: "Do you have a history of blood clots?" (or: recent major surgery, recent prolonged travel, bedridden)     No 10. OTHER SYMPTOMS: "Do you have any other symptoms?" (e.g., runny nose, wheezing, chest pain)       No other symptoms 11. PREGNANCY: "Is there any chance you are pregnant?" "When was your last menstrual period?"       N/A 12. TRAVEL: "Have you traveled out of the country in the last month?" (e.g., travel history, exposures)       No  Protocols used: COUGH - ACUTE NON-PRODUCTIVE-A-AH

## 2020-04-13 ENCOUNTER — Ambulatory Visit: Payer: Medicare Other | Admitting: Family Medicine

## 2020-04-22 DIAGNOSIS — M7062 Trochanteric bursitis, left hip: Secondary | ICD-10-CM | POA: Diagnosis not present

## 2020-04-22 DIAGNOSIS — M8949 Other hypertrophic osteoarthropathy, multiple sites: Secondary | ICD-10-CM | POA: Diagnosis not present

## 2020-04-27 ENCOUNTER — Encounter: Payer: Self-pay | Admitting: Physician Assistant

## 2020-04-27 ENCOUNTER — Ambulatory Visit (INDEPENDENT_AMBULATORY_CARE_PROVIDER_SITE_OTHER): Payer: Medicare Other | Admitting: Physician Assistant

## 2020-04-27 ENCOUNTER — Other Ambulatory Visit: Payer: Self-pay

## 2020-04-27 VITALS — BP 120/72 | HR 73 | Temp 97.6°F | Resp 16 | Ht 64.0 in | Wt 168.9 lb

## 2020-04-27 DIAGNOSIS — E669 Obesity, unspecified: Secondary | ICD-10-CM | POA: Diagnosis not present

## 2020-04-27 DIAGNOSIS — E1169 Type 2 diabetes mellitus with other specified complication: Secondary | ICD-10-CM | POA: Diagnosis not present

## 2020-04-27 DIAGNOSIS — Z1211 Encounter for screening for malignant neoplasm of colon: Secondary | ICD-10-CM

## 2020-04-27 DIAGNOSIS — R350 Frequency of micturition: Secondary | ICD-10-CM | POA: Diagnosis not present

## 2020-04-27 DIAGNOSIS — E782 Mixed hyperlipidemia: Secondary | ICD-10-CM

## 2020-04-27 DIAGNOSIS — G309 Alzheimer's disease, unspecified: Secondary | ICD-10-CM | POA: Diagnosis not present

## 2020-04-27 DIAGNOSIS — Z1231 Encounter for screening mammogram for malignant neoplasm of breast: Secondary | ICD-10-CM

## 2020-04-27 DIAGNOSIS — I10 Essential (primary) hypertension: Secondary | ICD-10-CM

## 2020-04-27 DIAGNOSIS — F0280 Dementia in other diseases classified elsewhere without behavioral disturbance: Secondary | ICD-10-CM | POA: Diagnosis not present

## 2020-04-27 NOTE — Progress Notes (Signed)
Established patient visit   Patient: Sandy Salinas   DOB: [birth year]   70 y.o. Female  MRN: [id number] Visit Date: 04/27/2020  Today's healthcare provider: Trey Sailors, PA-C   Chief Complaint  Patient presents with  . Follow-up  . Hyperlipidemia  . Hypertension  . Depression  . Diabetes   Subjective    HPI   She has been diagnosed with dementia by Dr. Sherryll Burger.   Diabetes Mellitus Type II, Follow-up  Lab Results  Component Value Date   HGBA1C 7.2 (H) 09/23/2019   HGBA1C 7.1 (H) 05/15/2019   HGBA1C 7.2 (H) 08/26/2018   Wt Readings from Last 3 Encounters:  04/27/20 168 lb 14.4 oz (76.6 kg)  11/28/19 161 lb 9.6 oz (73.3 kg)  11/21/19 160 lb (72.6 kg)   Last seen for diabetes 6 months ago.  Management since then includes continue metformin 500 mg BID. She reports excellent compliance with treatment. She is not having side effects.  Symptoms: No fatigue No foot ulcerations  No appetite changes No nausea  No paresthesia of the feet  No polydipsia  No polyuria No visual disturbances   No vomiting     Home blood sugar records: not checked  Episodes of hypoglycemia? No    Current insulin regiment: none Most Recent Eye Exam: 08/2019 Current exercise: aerobics Current diet habits: well balanced  Pertinent Labs: Lab Results  Component Value Date   CHOL 127 05/15/2019   HDL 37 (L) 05/15/2019   LDLCALC 68 05/15/2019   TRIG 139 05/15/2019   CHOLHDL 3.4 05/15/2019   Lab Results  Component Value Date   NA 137 09/23/2019   K 3.9 09/23/2019   CREATININE 0.68 09/23/2019   GFRNONAA 90 09/23/2019   GFRAA 104 09/23/2019   GLUCOSE 101 (H) 09/23/2019     --------------------------------------------------------------------------------------------------- Hypertension, follow-up  BP Readings from Last 3 Encounters:  04/27/20 120/72  11/28/19 130/76  11/21/19 (!) 143/86   Wt Readings from Last 3 Encounters:  04/27/20 168 lb 14.4 oz (76.6 kg)  11/28/19  161 lb 9.6 oz (73.3 kg)  11/21/19 160 lb (72.6 kg)     She was last seen for hypertension 6 months ago.  BP at that visit was normal. Management since that visit includes Continue medications.  She reports good compliance with treatment. She is not having side effects.  She is following a Regular diet. She is exercising. She does not smoke.  Use of agents associated with hypertension: none.   Outside blood pressures are normal. Symptoms: No chest pain No chest pressure  No palpitations No syncope  No dyspnea No orthopnea  No paroxysmal nocturnal dyspnea No lower extremity edema   Pertinent labs: Lab Results  Component Value Date   CHOL 127 05/15/2019   HDL 37 (L) 05/15/2019   LDLCALC 68 05/15/2019   TRIG 139 05/15/2019   CHOLHDL 3.4 05/15/2019   Lab Results  Component Value Date   NA 137 09/23/2019   K 3.9 09/23/2019   CREATININE 0.68 09/23/2019   GFRNONAA 90 09/23/2019   GFRAA 104 09/23/2019   GLUCOSE 101 (H) 09/23/2019     The ASCVD Risk score (Goff DC Jr., et al., 2013) failed to calculate for the following reasons:   The valid total cholesterol range is 130 to 320 mg/dL   --------------------------------------------------------------------------------------------------- Lipid/Cholesterol, Follow-up  Last lipid panel Other pertinent labs  Lab Results  Component Value Date   CHOL 127 05/15/2019   HDL 37 (L) 05/15/2019  LDLCALC 68 05/15/2019   TRIG 139 05/15/2019   CHOLHDL 3.4 05/15/2019   Lab Results  Component Value Date   ALT 31 (H) 09/23/2019   AST 47 (H) 09/23/2019   PLT 487 (H) 05/15/2019   TSH 2.83 02/20/2018     She was last seen for this 6 months ago.  Management since that visit includes continue statin.  She reports excellent compliance with treatment. She is not having side effects.   Symptoms: No chest pain No chest pressure/discomfort  No dyspnea No lower extremity edema  No numbness or tingling of extremity No orthopnea  No  palpitations No paroxysmal nocturnal dyspnea  No speech difficulty No syncope   Current diet: well balanced Current exercise: aerobics  The ASCVD Risk score Denman George DC Jr., et al., 2013) failed to calculate for the following reasons:   The valid total cholesterol range is 130 to 320 mg/dL  ---------------------------------------------------------------------------------------------------      Medications: Outpatient Medications Prior to Visit  Medication Sig  . atorvastatin (LIPITOR) 80 MG tablet TAKE 1 TABLET (80 MG) BY MOUTH AT BEDTIME  . Calcium Carbonate-Vitamin D (CALTRATE 600+D PO) Take by mouth daily.  . chlorthalidone (HYGROTON) 25 MG tablet Take 1 tablet (25 mg total) by mouth daily.  . Cholecalciferol (VITAMIN D) 2000 units tablet One by mouth three or four days per week  . clopidogrel (PLAVIX) 75 MG tablet Take 75 mg by mouth daily.  . DULoxetine (CYMBALTA) 30 MG capsule Take by mouth.  . gabapentin (NEURONTIN) 100 MG capsule Take by mouth.  Marland Kitchen lisinopril (ZESTRIL) 10 MG tablet Take 1 tablet (10 mg total) by mouth daily.  . Magnesium 100 MG TABS Take by mouth.  . metFORMIN (GLUCOPHAGE) 500 MG tablet TAKE 1 TABLET(500 MG) BY MOUTH TWICE DAILY  . mirabegron ER (MYRBETRIQ) 25 MG TB24 tablet Take 1 tablet (25 mg total) by mouth daily.  . Multiple Vitamin (MULTIVITAMIN) tablet Take 1 tablet by mouth daily.  . Potassium 99 MG TABS Take by mouth.  . TURMERIC PO Take 1 capsule by mouth at bedtime.  . vitamin B-12 (CYANOCOBALAMIN) 100 MCG tablet Take by mouth daily.  . vitamin E 100 UNIT capsule Take by mouth daily. Reported on 08/19/2015   No facility-administered medications prior to visit.    Review of Systems     Objective    BP 120/72   Pulse 73   Temp 97.6 F (36.4 C)   Resp 16   Ht 5\' 4"  (1.626 m)   Wt 168 lb 14.4 oz (76.6 kg)   SpO2 96%   BMI 28.99 kg/m     Physical Exam Constitutional:      Appearance: Normal appearance.  Cardiovascular:     Rate and  Rhythm: Normal rate and regular rhythm.     Heart sounds: Normal heart sounds.  Pulmonary:     Effort: Pulmonary effort is normal.     Breath sounds: Normal breath sounds.  Skin:    General: Skin is warm and dry.  Neurological:     Mental Status: She is alert and oriented to person, place, and time. Mental status is at baseline.  Psychiatric:        Mood and Affect: Mood normal.        Behavior: Behavior normal.       No results found for any visits on 04/27/20.  Assessment & Plan    1. Mixed hyperlipidemia  Continue statin.  2. Diabetes mellitus type 2 in obese (HCC)  Continue medications.   - HgB A1c - Microalbumin, urine  3. Primary hypertension  Continue medications.   - TSH - Lipid panel - Comprehensive metabolic panel - CBC with Differential/Platelet  4. Urinary frequency  Continue myrbetriq.  5. Colon cancer screening  - Cologuard  6. Alzheimer's dementia without behavioral disturbance, unspecified timing of dementia onset (HCC)  Followed by neurology.   7. Encounter for screening mammogram for malignant neoplasm of breast  - MM DIGITAL SCREENING BILATERAL   Return in about 6 months (around 10/28/2020).         Trey Sailors, PA-C  [facility] [phone] (phone) [phone] (fax)  [facility]

## 2020-04-27 NOTE — Patient Instructions (Addendum)
You are due for mammogram 09/2020.  You are due for eye exam 08/2020.

## 2020-04-28 ENCOUNTER — Telehealth: Payer: Self-pay | Admitting: Physician Assistant

## 2020-04-28 DIAGNOSIS — E1169 Type 2 diabetes mellitus with other specified complication: Secondary | ICD-10-CM

## 2020-04-28 DIAGNOSIS — E669 Obesity, unspecified: Secondary | ICD-10-CM

## 2020-04-28 LAB — CBC WITH DIFFERENTIAL/PLATELET
Absolute Monocytes: 738 cells/uL (ref 200–950)
Basophils Absolute: 95 cells/uL (ref 0–200)
Basophils Relative: 0.8 %
Eosinophils Absolute: 155 cells/uL (ref 15–500)
Eosinophils Relative: 1.3 %
HCT: 41 % (ref 35.0–45.0)
Hemoglobin: 13.1 g/dL (ref 11.7–15.5)
Lymphs Abs: 3487 cells/uL (ref 850–3900)
MCH: 27.2 pg (ref 27.0–33.0)
MCHC: 32 g/dL (ref 32.0–36.0)
MCV: 85.1 fL (ref 80.0–100.0)
MPV: 9.8 fL (ref 7.5–12.5)
Monocytes Relative: 6.2 %
Neutro Abs: 7426 cells/uL (ref 1500–7800)
Neutrophils Relative %: 62.4 %
Platelets: 530 10*3/uL — ABNORMAL HIGH (ref 140–400)
RBC: 4.82 10*6/uL (ref 3.80–5.10)
RDW: 13.6 % (ref 11.0–15.0)
Total Lymphocyte: 29.3 %
WBC: 11.9 10*3/uL — ABNORMAL HIGH (ref 3.8–10.8)

## 2020-04-28 LAB — COMPREHENSIVE METABOLIC PANEL
AG Ratio: 1.6 (calc) (ref 1.0–2.5)
ALT: 14 U/L (ref 6–29)
AST: 24 U/L (ref 10–35)
Albumin: 4.4 g/dL (ref 3.6–5.1)
Alkaline phosphatase (APISO): 51 U/L (ref 37–153)
BUN: 18 mg/dL (ref 7–25)
CO2: 31 mmol/L (ref 20–32)
Calcium: 9.8 mg/dL (ref 8.6–10.4)
Chloride: 100 mmol/L (ref 98–110)
Creat: 0.77 mg/dL (ref 0.50–0.99)
Globulin: 2.7 g/dL (calc) (ref 1.9–3.7)
Glucose, Bld: 103 mg/dL — ABNORMAL HIGH (ref 65–99)
Potassium: 4.5 mmol/L (ref 3.5–5.3)
Sodium: 138 mmol/L (ref 135–146)
Total Bilirubin: 0.4 mg/dL (ref 0.2–1.2)
Total Protein: 7.1 g/dL (ref 6.1–8.1)

## 2020-04-28 LAB — HEMOGLOBIN A1C
Hgb A1c MFr Bld: 8 % of total Hgb — ABNORMAL HIGH (ref <5.7)
Mean Plasma Glucose: 183 mg/dL
eAG (mmol/L): 10.1 mmol/L

## 2020-04-28 LAB — LIPID PANEL
Cholesterol: 151 mg/dL (ref <200)
HDL: 39 mg/dL — ABNORMAL LOW (ref >=50)
LDL Cholesterol (Calc): 77 mg/dL (calc)
Non-HDL Cholesterol (Calc): 112 mg/dL (calc) (ref <130)
Total CHOL/HDL Ratio: 3.9 (calc) (ref <5.0)
Triglycerides: 265 mg/dL — ABNORMAL HIGH (ref <150)

## 2020-04-28 LAB — MICROALBUMIN, URINE: Microalb, Ur: <0.2 mg/dL

## 2020-04-28 LAB — TSH: TSH: 1.75 mIU/L (ref 0.40–4.50)

## 2020-04-28 MED ORDER — METFORMIN HCL 1000 MG PO TABS: 1000.0000 mg | ORAL_TABLET | Freq: Two times a day (BID) | ORAL | 0 refills | Status: DC

## 2020-04-28 NOTE — Telephone Encounter (Signed)
Increased metformin to 1000 mg BID and sent in new prescription.

## 2020-05-04 DIAGNOSIS — Z23 Encounter for immunization: Secondary | ICD-10-CM | POA: Diagnosis not present

## 2020-05-05 DIAGNOSIS — R569 Unspecified convulsions: Secondary | ICD-10-CM | POA: Diagnosis not present

## 2020-05-10 ENCOUNTER — Encounter: Payer: Self-pay | Admitting: Physician Assistant

## 2020-05-11 ENCOUNTER — Encounter: Payer: Self-pay | Admitting: Family Medicine

## 2020-05-11 DIAGNOSIS — R0982 Postnasal drip: Secondary | ICD-10-CM | POA: Diagnosis not present

## 2020-05-11 DIAGNOSIS — R059 Cough, unspecified: Secondary | ICD-10-CM | POA: Diagnosis not present

## 2020-06-07 DIAGNOSIS — F0390 Unspecified dementia without behavioral disturbance: Secondary | ICD-10-CM | POA: Diagnosis not present

## 2020-06-07 DIAGNOSIS — I77811 Abdominal aortic ectasia: Secondary | ICD-10-CM | POA: Diagnosis not present

## 2020-06-07 DIAGNOSIS — Z8673 Personal history of transient ischemic attack (TIA), and cerebral infarction without residual deficits: Secondary | ICD-10-CM | POA: Diagnosis not present

## 2020-06-07 DIAGNOSIS — R42 Dizziness and giddiness: Secondary | ICD-10-CM | POA: Diagnosis not present

## 2020-06-07 DIAGNOSIS — G459 Transient cerebral ischemic attack, unspecified: Secondary | ICD-10-CM | POA: Diagnosis not present

## 2020-06-07 DIAGNOSIS — R0609 Other forms of dyspnea: Secondary | ICD-10-CM | POA: Diagnosis not present

## 2020-06-07 DIAGNOSIS — E119 Type 2 diabetes mellitus without complications: Secondary | ICD-10-CM | POA: Diagnosis not present

## 2020-06-07 DIAGNOSIS — R0602 Shortness of breath: Secondary | ICD-10-CM | POA: Diagnosis not present

## 2020-06-07 DIAGNOSIS — R9431 Abnormal electrocardiogram [ECG] [EKG]: Secondary | ICD-10-CM | POA: Diagnosis not present

## 2020-06-07 DIAGNOSIS — E782 Mixed hyperlipidemia: Secondary | ICD-10-CM | POA: Diagnosis not present

## 2020-06-07 DIAGNOSIS — I10 Essential (primary) hypertension: Secondary | ICD-10-CM | POA: Diagnosis not present

## 2020-06-11 ENCOUNTER — Other Ambulatory Visit: Payer: Self-pay | Admitting: Family Medicine

## 2020-06-11 DIAGNOSIS — I10 Essential (primary) hypertension: Secondary | ICD-10-CM

## 2020-06-14 ENCOUNTER — Telehealth: Payer: Self-pay

## 2020-06-14 NOTE — Telephone Encounter (Signed)
Copied from CRM [id number]. Topic: General - Other >> Jun 14, 2020 10:45 AM Gaetana Michaelis A wrote: Reason for CRM: Patient has called to confirm that they are taking chlorthalidone (HYGROTON) 25 MG tablet   Patient stressed that they are taking 25 MG and not 50 MG tablets  Please contact to further advise if needed

## 2020-06-14 NOTE — Telephone Encounter (Signed)
Patient called back to report she is taking

## 2020-06-16 DIAGNOSIS — G4733 Obstructive sleep apnea (adult) (pediatric): Secondary | ICD-10-CM | POA: Diagnosis not present

## 2020-06-23 DIAGNOSIS — G4733 Obstructive sleep apnea (adult) (pediatric): Secondary | ICD-10-CM | POA: Diagnosis not present

## 2020-06-29 ENCOUNTER — Encounter: Payer: Self-pay | Admitting: Family Medicine

## 2020-06-29 DIAGNOSIS — Z1211 Encounter for screening for malignant neoplasm of colon: Secondary | ICD-10-CM

## 2020-07-07 DIAGNOSIS — Z1211 Encounter for screening for malignant neoplasm of colon: Secondary | ICD-10-CM | POA: Diagnosis not present

## 2020-07-08 ENCOUNTER — Other Ambulatory Visit: Payer: Self-pay | Admitting: Internal Medicine

## 2020-07-08 DIAGNOSIS — M810 Age-related osteoporosis without current pathological fracture: Secondary | ICD-10-CM

## 2020-07-09 ENCOUNTER — Telehealth: Payer: Self-pay | Admitting: Family Medicine

## 2020-07-09 ENCOUNTER — Other Ambulatory Visit: Payer: Self-pay | Admitting: Family Medicine

## 2020-07-09 DIAGNOSIS — E11649 Type 2 diabetes mellitus with hypoglycemia without coma: Secondary | ICD-10-CM

## 2020-07-09 DIAGNOSIS — I10 Essential (primary) hypertension: Secondary | ICD-10-CM

## 2020-07-09 NOTE — Telephone Encounter (Addendum)
Pt seen Sandy Salinas on 04-27-2020 and per progress note she needs an appt in 6 months. Pt has made an appt for 10-21-2020 and would like a refill on lisinopril 10 mg send to [facility] [address] in [place]. Pt also mention they have received her cologuard test

## 2020-07-09 NOTE — Telephone Encounter (Signed)
Pt notified given 1 year supply, it was denied due to not needing refill yet.

## 2020-07-11 LAB — EXTERNAL GENERIC LAB PROCEDURE: COLOGUARD: NEGATIVE

## 2020-07-11 LAB — COLOGUARD
COLOGUARD: NEGATIVE
Cologuard: NEGATIVE

## 2020-07-11 IMAGING — MG DIGITAL DIAGNOSTIC BILATERAL MAMMOGRAM WITH TOMO AND CAD
6 of 10 series · 6 of 26 positions shown · non-contrast
Comparison: Previous exam(s).

CLINICAL DATA: Final follow-up for probably benign left breast
calcifications.

EXAM:
DIGITAL DIAGNOSTIC BILATERAL MAMMOGRAM WITH CAD AND TOMO

[L ML]
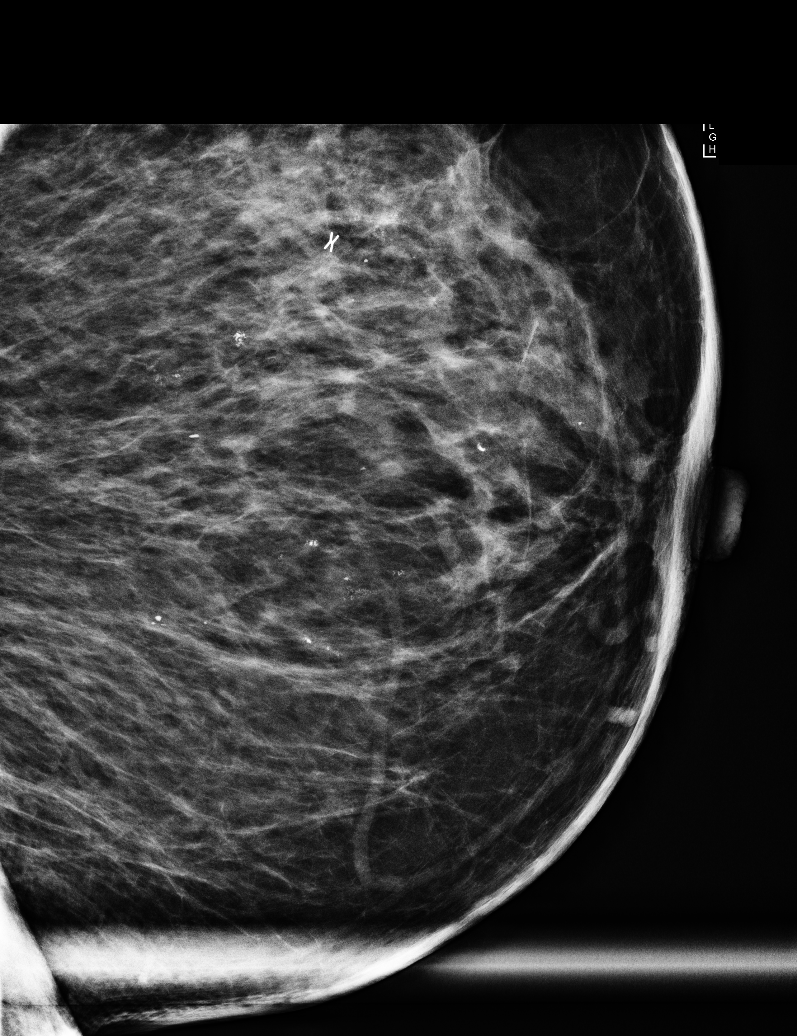

[L CC]
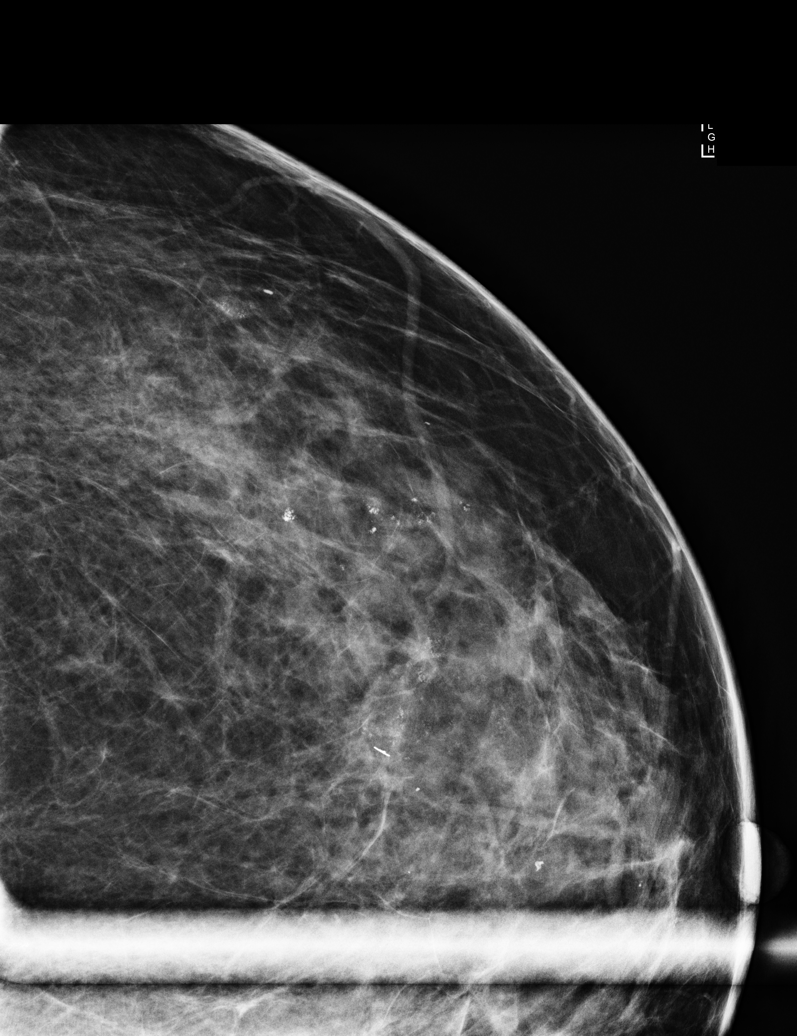

[L CC synth-2D]
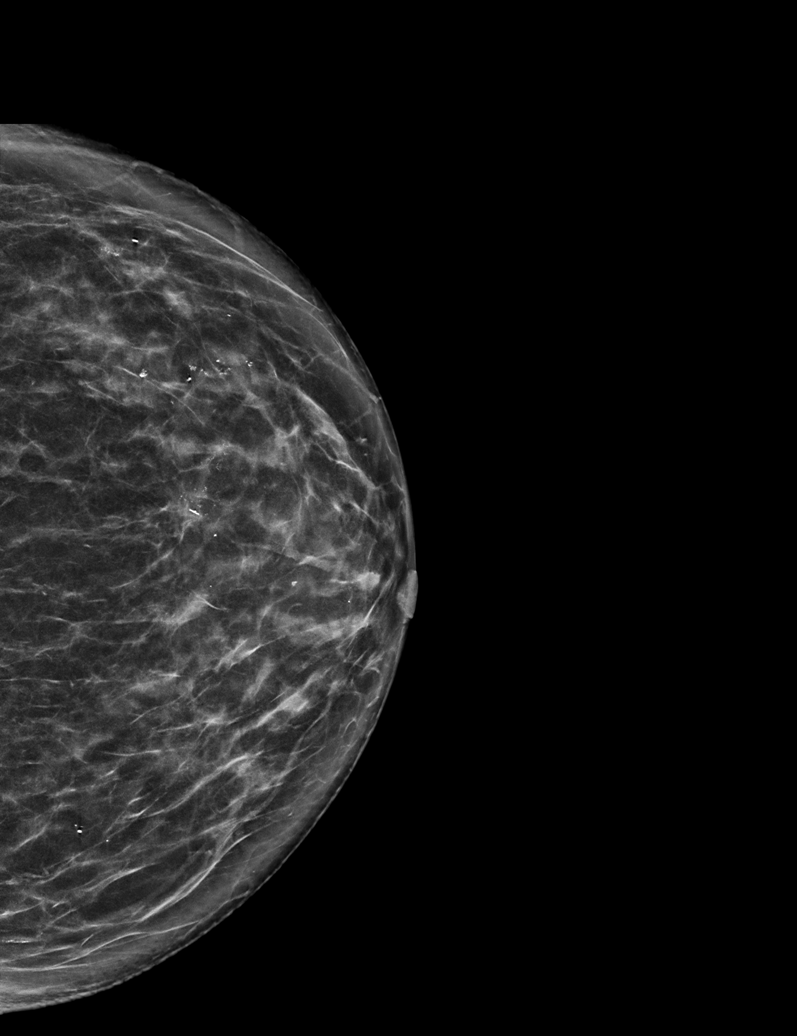

[R CC synth-2D]
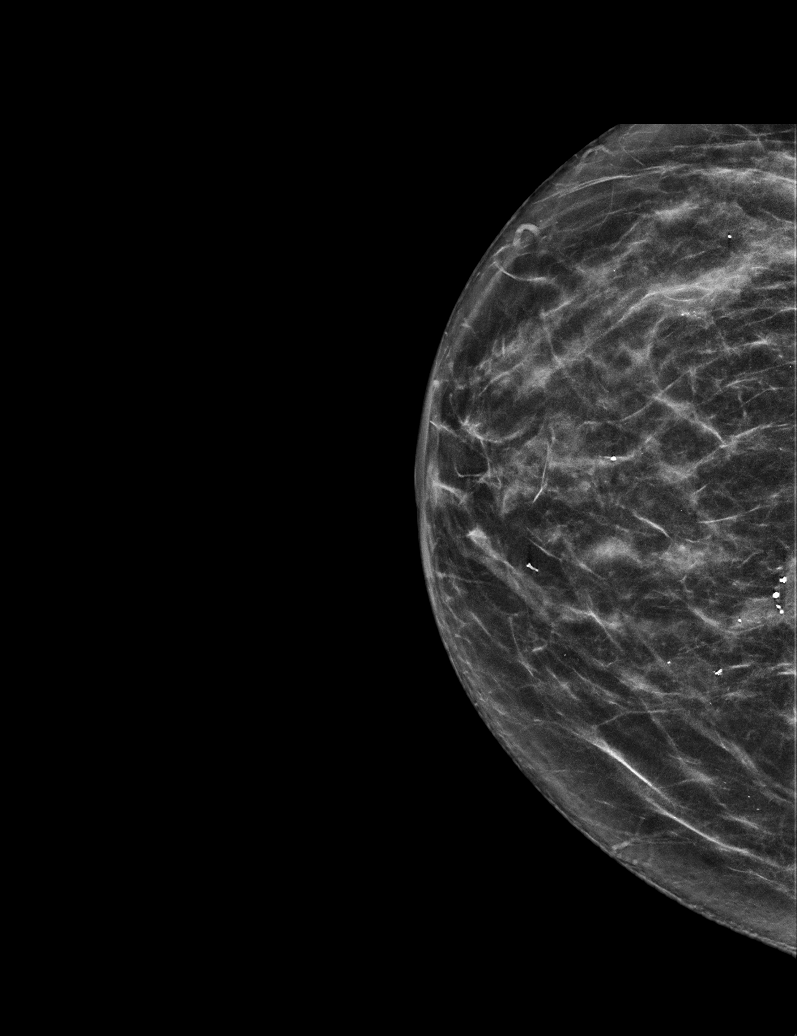

[R MLO synth-2D]
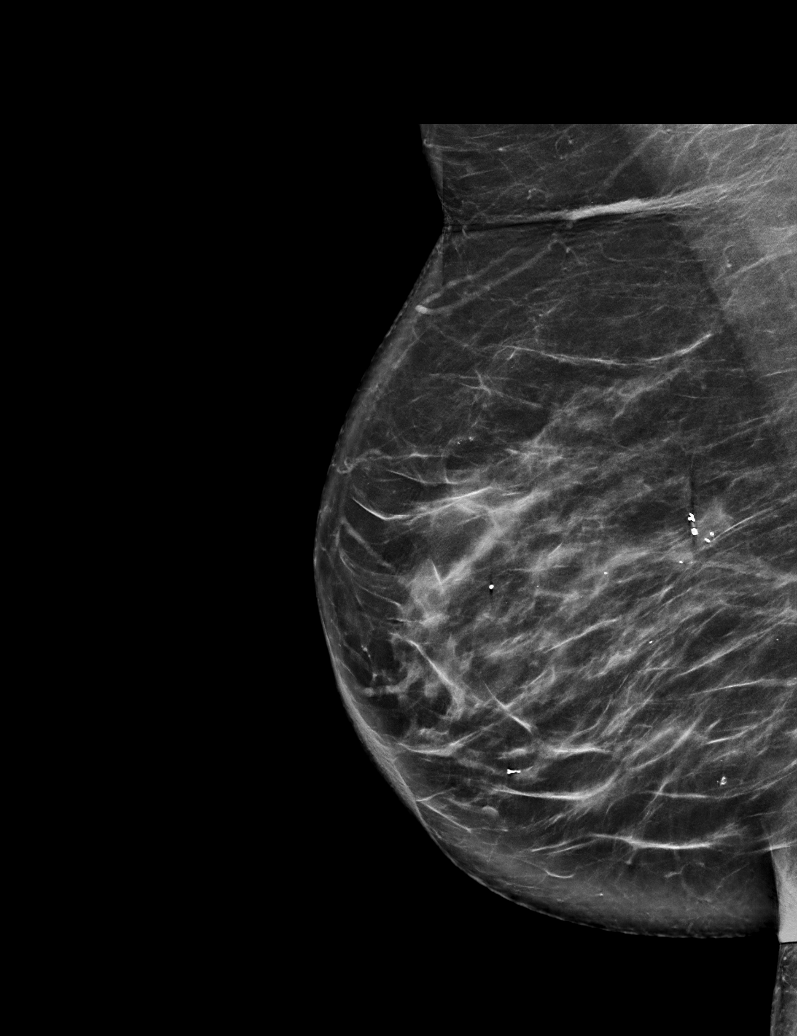

[L MLO synth-2D]
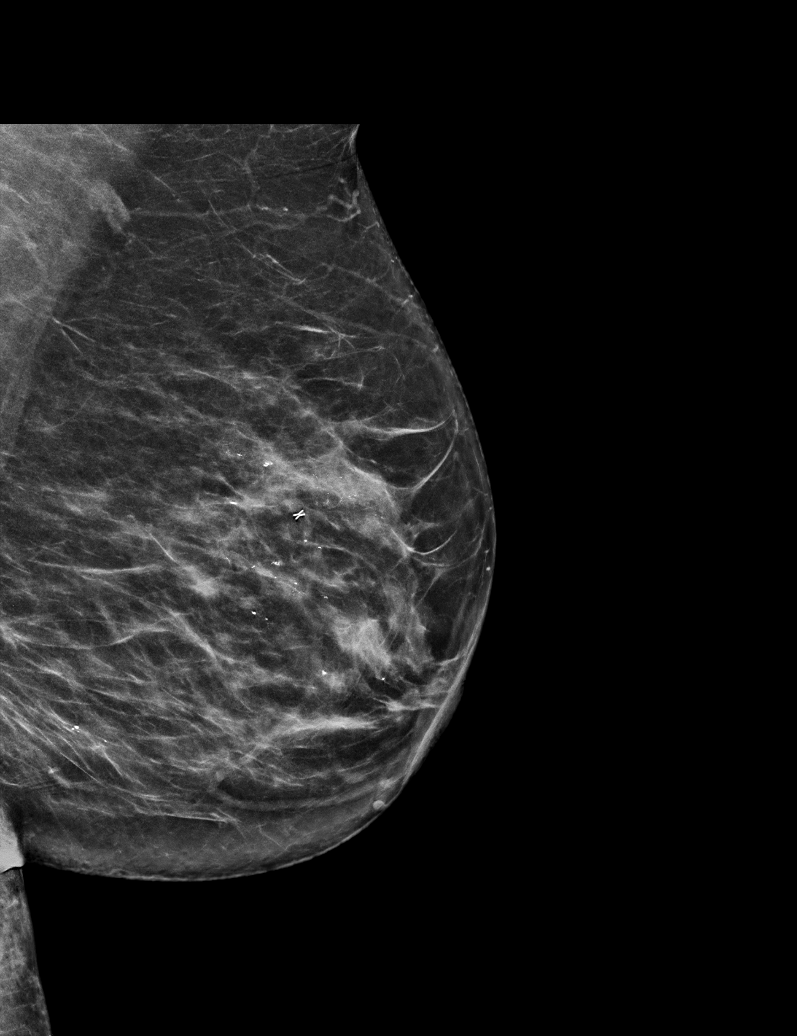

[6 of 26 positions shown; findings below may reference images not displayed]

ACR Breast Density Category c: The breast tissue is heterogeneously
dense, which may obscure small masses.
FINDINGS: The calcifications in the lower outer quadrant of the left breast
are stable. No suspicious calcifications, masses or areas of
distortion are seen in the bilateral breasts.

Mammographic images were processed with CAD.
IMPRESSION: 1. The calcifications in the lower outer quadrant of the left breast
have demonstrated 2 years of stability, and are therefore benign.

2.  No mammographic evidence of malignancy in the bilateral breasts.

RECOMMENDATION:
Screening mammogram in one year.(Code:[id number])

I have discussed the findings and recommendations with the patient.
Results were also provided in writing at the conclusion of the
visit. If applicable, a reminder letter will be sent to the patient
regarding the next appointment.

BI-RADS CATEGORY  2: Benign.

## 2020-08-04 DIAGNOSIS — R569 Unspecified convulsions: Secondary | ICD-10-CM | POA: Diagnosis not present

## 2020-08-04 DIAGNOSIS — R4020 Unspecified coma: Secondary | ICD-10-CM | POA: Diagnosis not present

## 2020-08-11 ENCOUNTER — Other Ambulatory Visit: Payer: Self-pay

## 2020-08-11 DIAGNOSIS — E11649 Type 2 diabetes mellitus with hypoglycemia without coma: Secondary | ICD-10-CM

## 2020-08-11 DIAGNOSIS — E1169 Type 2 diabetes mellitus with other specified complication: Secondary | ICD-10-CM

## 2020-08-11 DIAGNOSIS — E119 Type 2 diabetes mellitus without complications: Secondary | ICD-10-CM | POA: Diagnosis not present

## 2020-08-11 DIAGNOSIS — I10 Essential (primary) hypertension: Secondary | ICD-10-CM

## 2020-08-11 MED ORDER — LISINOPRIL 10 MG PO TABS: 10.0000 mg | ORAL_TABLET | Freq: Every day | ORAL | 3 refills | Status: DC

## 2020-08-11 MED ORDER — METFORMIN HCL 1000 MG PO TABS: 500.0000 mg | ORAL_TABLET | Freq: Two times a day (BID) | ORAL | 2 refills | Status: DC

## 2020-08-18 ENCOUNTER — Encounter: Payer: Self-pay | Admitting: Family Medicine

## 2020-08-19 ENCOUNTER — Ambulatory Visit: Payer: Medicare Other

## 2020-08-20 ENCOUNTER — Other Ambulatory Visit: Payer: Self-pay

## 2020-08-20 ENCOUNTER — Encounter: Payer: Self-pay | Admitting: Family Medicine

## 2020-08-20 MED ORDER — CHLORTHALIDONE 25 MG PO TABS: 25.0000 mg | ORAL_TABLET | Freq: Every day | ORAL | 3 refills | Status: DC

## 2020-08-24 DIAGNOSIS — M159 Polyosteoarthritis, unspecified: Secondary | ICD-10-CM | POA: Diagnosis not present

## 2020-08-24 DIAGNOSIS — M1712 Unilateral primary osteoarthritis, left knee: Secondary | ICD-10-CM | POA: Diagnosis not present

## 2020-08-25 ENCOUNTER — Other Ambulatory Visit: Payer: Medicare Other

## 2020-08-29 ENCOUNTER — Encounter: Payer: Self-pay | Admitting: Family Medicine

## 2020-08-30 ENCOUNTER — Other Ambulatory Visit: Payer: Self-pay

## 2020-08-30 ENCOUNTER — Encounter: Payer: Self-pay | Admitting: Family Medicine

## 2020-08-30 ENCOUNTER — Telehealth (INDEPENDENT_AMBULATORY_CARE_PROVIDER_SITE_OTHER): Payer: Medicare Other | Admitting: Family Medicine

## 2020-08-30 VITALS — BP 120/75 | Temp 98.6°F

## 2020-08-30 DIAGNOSIS — U071 COVID-19: Secondary | ICD-10-CM | POA: Diagnosis not present

## 2020-08-30 MED ORDER — NIRMATRELVIR/RITONAVIR (PAXLOVID)TABLET
3.0000 | ORAL_TABLET | Freq: Two times a day (BID) | ORAL | 0 refills | Status: AC
  Filled 2020-08-30: qty 30, 5d supply, fill #0

## 2020-08-30 NOTE — Patient Instructions (Addendum)
It was great to see you!  Our plans for today:  - See below for self-isolation guidelines. You may end your quarantine once you are 5 days from symptom onset and fever free for 24 hours without use of tylenol or ibuprofen. Wear a well-fitting N95 mask for an additional 5 days. - Take the paxlovid as directed. See [email or web address].[email or web address] for more information about the medication. - I recommend getting vaccinated once you are healed from your current infection. - Certainly, if you are having difficulties breathing or unable to keep down fluids, go to the Emergency Department.   Take care and seek immediate care sooner if you develop any concerns.   Dr. Linwood Dibbles     Person Under Monitoring Name: Sandy Salinas  Location: [address] [place] [address]   Infection Prevention Recommendations for Individuals Confirmed to have, or Being Evaluated for, 2019 Novel Coronavirus (COVID-19) Infection Who Receive Care at Home  Individuals who are confirmed to have, or are being evaluated for, COVID-19 should follow the prevention steps below until a healthcare provider or local or state health department says they can return to normal activities.  Stay home except to get medical care You should restrict activities outside your home, except for getting medical care. Do not go to work, school, or public areas, and do not use public transportation or taxis.  Call ahead before visiting your doctor Before your medical appointment, call the healthcare provider and tell them that you have, or are being evaluated for, COVID-19 infection. This will help the healthcare provider's office take steps to keep other people from getting infected. Ask your healthcare provider to call the local or state health department.  Monitor your symptoms Seek prompt medical attention if your illness is worsening (e.g., difficulty breathing). Before going to your medical appointment, call the  healthcare provider and tell them that you have, or are being evaluated for, COVID-19 infection. Ask your healthcare provider to call the local or state health department.  Wear a facemask You should wear a facemask that covers your nose and mouth when you are in the same room with other people and when you visit a healthcare provider. People who live with or visit you should also wear a facemask while they are in the same room with you.  Separate yourself from other people in your home As much as possible, you should stay in a different room from other people in your home. Also, you should use a separate bathroom, if available.  Avoid sharing household items You should not share dishes, drinking glasses, cups, eating utensils, towels, bedding, or other items with other people in your home. After using these items, you should wash them thoroughly with soap and water.  Cover your coughs and sneezes Cover your mouth and nose with a tissue when you cough or sneeze, or you can cough or sneeze into your sleeve. Throw used tissues in a lined trash can, and immediately wash your hands with soap and water for at least 20 seconds or use an alcohol-based hand rub.  Wash your [employer] your hands often and thoroughly with soap and water for at least 20 seconds. You can use an alcohol-based hand sanitizer if soap and water are not available and if your hands are not visibly dirty. Avoid touching your eyes, nose, and mouth with unwashed hands.   Prevention Steps for Caregivers and Household Members of Individuals Confirmed to have, or Being Evaluated for, COVID-19 Infection Being Cared for in  the Home  If you live with, or provide care at home for, a person confirmed to have, or being evaluated for, COVID-19 infection please follow these guidelines to prevent infection:  Follow healthcare provider's instructions Make sure that you understand and can help the patient follow any healthcare  provider instructions for all care.  Provide for the patient's basic needs You should help the patient with basic needs in the home and provide support for getting groceries, prescriptions, and other personal needs.  Monitor the patient's symptoms If they are getting sicker, call his or her medical provider and tell them that the patient has, or is being evaluated for, COVID-19 infection. This will help the healthcare provider's office take steps to keep other people from getting infected. Ask the healthcare provider to call the local or state health department.  Limit the number of people who have contact with the patient If possible, have only one caregiver for the patient. Other household members should stay in another home or place of residence. If this is not possible, they should stay in another room, or be separated from the patient as much as possible. Use a separate bathroom, if available. Restrict visitors who do not have an essential need to be in the home.  Keep older adults, very young children, and other sick people away from the patient Keep older adults, very young children, and those who have compromised immune systems or chronic health conditions away from the patient. This includes people with chronic heart, lung, or kidney conditions, diabetes, and cancer.  Ensure good ventilation Make sure that shared spaces in the home have good air flow, such as from an air conditioner or an opened window, weather permitting.  Wash your hands often Wash your hands often and thoroughly with soap and water for at least 20 seconds. You can use an alcohol based hand sanitizer if soap and water are not available and if your hands are not visibly dirty. Avoid touching your eyes, nose, and mouth with unwashed hands. Use disposable paper towels to dry your hands. If not available, use dedicated cloth towels and replace them when they become wet.  Wear a facemask and gloves Wear a  disposable facemask at all times in the room and gloves when you touch or have contact with the patient's blood, body fluids, and/or secretions or excretions, such as sweat, saliva, sputum, nasal mucus, vomit, urine, or feces.  Ensure the mask fits over your nose and mouth tightly, and do not touch it during use. Throw out disposable facemasks and gloves after using them. Do not reuse. Wash your hands immediately after removing your facemask and gloves. If your personal clothing becomes contaminated, carefully remove clothing and launder. Wash your hands after handling contaminated clothing. Place all used disposable facemasks, gloves, and other waste in a lined container before disposing them with other household waste. Remove gloves and wash your hands immediately after handling these items.  Do not share dishes, glasses, or other household items with the patient Avoid sharing household items. You should not share dishes, drinking glasses, cups, eating utensils, towels, bedding, or other items with a patient who is confirmed to have, or being evaluated for, COVID-19 infection. After the person uses these items, you should wash them thoroughly with soap and water.  Wash laundry thoroughly Immediately remove and wash clothes or bedding that have blood, body fluids, and/or secretions or excretions, such as sweat, saliva, sputum, nasal mucus, vomit, urine, or feces, on them. Wear gloves when  handling laundry from the patient. Read and follow directions on labels of laundry or clothing items and detergent. In general, wash and dry with the warmest temperatures recommended on the label.  Clean all areas the individual has used often Clean all touchable surfaces, such as counters, tabletops, doorknobs, bathroom fixtures, toilets, phones, keyboards, tablets, and bedside tables, every day. Also, clean any surfaces that may have blood, body fluids, and/or secretions or excretions on them. Wear gloves when  cleaning surfaces the patient has come in contact with. Use a diluted bleach solution (e.g., dilute bleach with 1 part bleach and 10 parts water) or a household disinfectant with a label that says EPA-registered for coronaviruses. To make a bleach solution at home, add 1 tablespoon of bleach to 1 quart (4 cups) of water. For a larger supply, add  cup of bleach to 1 gallon (16 cups) of water. Read labels of cleaning products and follow recommendations provided on product labels. Labels contain instructions for safe and effective use of the cleaning product including precautions you should take when applying the product, such as wearing gloves or eye protection and making sure you have good ventilation during use of the product. Remove gloves and wash hands immediately after cleaning.  Monitor yourself for signs and symptoms of illness Caregivers and household members are considered close contacts, should monitor their health, and will be asked to limit movement outside of the home to the extent possible. Follow the monitoring steps for close contacts listed on the symptom monitoring form.   ? If you have additional questions, contact your local health department or call the epidemiologist on call at [phone] (available 24/7). ? This guidance is subject to change. For the most up-to-date guidance from [facility], please refer to their website: [email or web address].pl

## 2020-08-30 NOTE — Progress Notes (Signed)
Virtual Visit via Video Note  I connected with [name] on 08/30/20 at  3:40 PM EDT by a video enabled telemedicine application and verified that I am speaking with the correct person using two identifiers.  Location: Patient: car Provider: [facility]   I discussed the limitations of evaluation and management by telemedicine and the availability of in person appointments. The patient expressed understanding and agreed to proceed.  History of Present Illness:  UPPER RESPIRATORY TRACT INFECTION - symptom onset 7/23 - COVID positive 7/23  Worst symptom: Fever:  yes, but now resolved Cough: yes Shortness of breath: no Wheezing: no Chest pain: no Chest tightness: no Chest congestion: no Nasal congestion: yes Runny nose: yes Sore throat: no Headache: yes Ear pain: no  Ear pressure: no  Vomiting: no Fatigue: yes Sick contacts: yes Relief with OTC cold/cough medications:  hasn't tried   Treatments attempted:  tylenol    Observations/Objective:  Well appearing, in NAD. No respiratory distress.  Assessment and Plan:  COVID-19 Doing well with mild sx. Is a candidate for COVID treatment, rx for paxlovid sent to pharmacy. Advised withholding statin during therapy and to resume after. Reviewed OTC symptom relief, self-quarantine guidelines, and emergency precautions.     I discussed the assessment and treatment plan with the patient. The patient was provided an opportunity to ask questions and all were answered. The patient agreed with the plan and demonstrated an understanding of the instructions.   The patient was advised to call back or seek an in-person evaluation if the symptoms worsen or if the condition fails to improve as anticipated.  I provided 7 minutes of non-face-to-face time during this encounter.   Caro Laroche, DO

## 2020-09-02 ENCOUNTER — Ambulatory Visit: Payer: Medicare Other

## 2020-09-02 ENCOUNTER — Telehealth: Payer: Self-pay

## 2020-09-02 NOTE — Telephone Encounter (Signed)
Appt resch

## 2020-09-02 NOTE — Telephone Encounter (Signed)
Copied from CRM [id number]. Topic: General - Other >> Sep 02, 2020 10:07 AM Aretta Nip wrote: Reason for CRM: Pt missed her appt [facility] Lyons this am and was wanting a cb to resch at [phone]

## 2020-09-12 ENCOUNTER — Encounter: Payer: Self-pay | Admitting: Family Medicine

## 2020-09-13 ENCOUNTER — Other Ambulatory Visit: Payer: Self-pay

## 2020-09-14 ENCOUNTER — Other Ambulatory Visit: Payer: Self-pay

## 2020-09-14 ENCOUNTER — Ambulatory Visit (INDEPENDENT_AMBULATORY_CARE_PROVIDER_SITE_OTHER): Payer: Medicare Other

## 2020-09-14 ENCOUNTER — Ambulatory Visit
Admission: RE | Admit: 2020-09-14 | Discharge: 2020-09-14 | Disposition: A | Discharge status: Home / Self Care | Payer: Medicare Other | Source: Ambulatory Visit | Attending: Internal Medicine | Admitting: Internal Medicine

## 2020-09-14 DIAGNOSIS — Z1231 Encounter for screening mammogram for malignant neoplasm of breast: Secondary | ICD-10-CM | POA: Diagnosis not present

## 2020-09-14 DIAGNOSIS — M810 Age-related osteoporosis without current pathological fracture: Secondary | ICD-10-CM | POA: Diagnosis not present

## 2020-09-14 DIAGNOSIS — Z780 Asymptomatic menopausal state: Secondary | ICD-10-CM | POA: Diagnosis not present

## 2020-09-14 DIAGNOSIS — M85851 Other specified disorders of bone density and structure, right thigh: Secondary | ICD-10-CM | POA: Diagnosis not present

## 2020-09-14 DIAGNOSIS — Z0000 Encounter for general adult medical examination without abnormal findings: Secondary | ICD-10-CM | POA: Diagnosis not present

## 2020-09-14 NOTE — Patient Instructions (Signed)
Ms. Flatley , Thank you for taking time to come for your Medicare Wellness Visit. I appreciate your ongoing commitment to your health goals. Please review the following plan we discussed and let me know if I can assist you in the future.   Screening recommendations/referrals: Colonoscopy: Cologuard done 07/07/20 Mammogram: done 09/26/19.  Bone Density: scheduled for today Recommended yearly ophthalmology/optometry visit for glaucoma screening and checkup Recommended yearly dental visit for hygiene and checkup  Vaccinations: Influenza vaccine: done 09/26/19 Pneumococcal vaccine: done 11/20/17 Tdap vaccine: done 04/14/16 Shingles vaccine: Shingrix discussed. Please contact your pharmacy for coverage information.  Covid-19: done 02/27/19, 03/20/19, 11/03/19 & 05/04/20  Advanced directives: Please bring a copy of your health care power of attorney and living will to the office at your convenience.   Conditions/risks identified: Keep up the great work!  Next appointment: Follow up in one year for your annual wellness visit    Preventive Care 65 Years and Older, Female Preventive care refers to lifestyle choices and visits with your health care provider that can promote health and wellness. What does preventive care include? A yearly physical exam. This is also called an annual well check. Dental exams once or twice a year. Routine eye exams. Ask your health care provider how often you should have your eyes checked. Personal lifestyle choices, including: Daily care of your teeth and gums. Regular physical activity. Eating a healthy diet. Avoiding tobacco and drug use. Limiting alcohol use. Practicing safe sex. Taking low-dose aspirin every day. Taking vitamin and mineral supplements as recommended by your health care provider. What happens during an annual well check? The services and screenings done by your health care provider during your annual well check will depend on your age, overall  health, lifestyle risk factors, and family history of disease. Counseling  Your health care provider may ask you questions about your: Alcohol use. Tobacco use. Drug use. Emotional well-being. Home and relationship well-being. Sexual activity. Eating habits. History of falls. Memory and ability to understand (cognition). Work and work Astronomer. Reproductive health. Screening  You may have the following tests or measurements: Height, weight, and BMI. Blood pressure. Lipid and cholesterol levels. These may be checked every 5 years, or more frequently if you are over 35 years old. Skin check. Lung cancer screening. You may have this screening every year starting at age 77 if you have a 30-pack-year history of smoking and currently smoke or have quit within the past 15 years. Fecal occult blood test (FOBT) of the stool. You may have this test every year starting at age 62. Flexible sigmoidoscopy or colonoscopy. You may have a sigmoidoscopy every 5 years or a colonoscopy every 10 years starting at age 64. Hepatitis C blood test. Hepatitis B blood test. Sexually transmitted disease (STD) testing. Diabetes screening. This is done by checking your blood sugar (glucose) after you have not eaten for a while (fasting). You may have this done every 1-3 years. Bone density scan. This is done to screen for osteoporosis. You may have this done starting at age 46. Mammogram. This may be done every 1-2 years. Talk to your health care provider about how often you should have regular mammograms. Talk with your health care provider about your test results, treatment options, and if necessary, the need for more tests. Vaccines  Your health care provider may recommend certain vaccines, such as: Influenza vaccine. This is recommended every year. Tetanus, diphtheria, and acellular pertussis (Tdap, Td) vaccine. You may need a Td booster every  10 years. Zoster vaccine. You may need this after age  23. Pneumococcal 13-valent conjugate (PCV13) vaccine. One dose is recommended after age 21. Pneumococcal polysaccharide (PPSV23) vaccine. One dose is recommended after age 51. Talk to your health care provider about which screenings and vaccines you need and how often you need them. This information is not intended to replace advice given to you by your health care provider. Make sure you discuss any questions you have with your health care provider. Document Released: 02/19/2015 Document Revised: 10/13/2015 Document Reviewed: 11/24/2014 Elsevier Interactive Patient Education  2017 [place] Prevention in the Home Falls can cause injuries. They can happen to people of all ages. There are many things you can do to make your home safe and to help prevent falls. What can I do on the outside of my home? Regularly fix the edges of walkways and driveways and fix any cracks. Remove anything that might make you trip as you walk through a door, such as a raised step or threshold. Trim any bushes or trees on the path to your home. Use bright outdoor lighting. Clear any walking paths of anything that might make someone trip, such as rocks or tools. Regularly check to see if handrails are loose or broken. Make sure that both sides of any steps have handrails. Any raised decks and porches should have guardrails on the edges. Have any leaves, snow, or ice cleared regularly. Use sand or salt on walking paths during winter. Clean up any spills in your garage right away. This includes oil or grease spills. What can I do in the bathroom? Use night lights. Install grab bars by the toilet and in the tub and shower. Do not use towel bars as grab bars. Use non-skid mats or decals in the tub or shower. If you need to sit down in the shower, use a plastic, non-slip stool. Keep the floor dry. Clean up any water that spills on the floor as soon as it happens. Remove soap buildup in the tub or shower  regularly. Attach bath mats securely with double-sided non-slip rug tape. Do not have throw rugs and other things on the floor that can make you trip. What can I do in the bedroom? Use night lights. Make sure that you have a light by your bed that is easy to reach. Do not use any sheets or blankets that are too big for your bed. They should not hang down onto the floor. Have a firm chair that has side arms. You can use this for support while you get dressed. Do not have throw rugs and other things on the floor that can make you trip. What can I do in the kitchen? Clean up any spills right away. Avoid walking on wet floors. Keep items that you use a lot in easy-to-reach places. If you need to reach something above you, use a strong step stool that has a grab bar. Keep electrical cords out of the way. Do not use floor polish or wax that makes floors slippery. If you must use wax, use non-skid floor wax. Do not have throw rugs and other things on the floor that can make you trip. What can I do with my stairs? Do not leave any items on the stairs. Make sure that there are handrails on both sides of the stairs and use them. Fix handrails that are broken or loose. Make sure that handrails are as long as the stairways. Check any carpeting to make  sure that it is firmly attached to the stairs. Fix any carpet that is loose or worn. Avoid having throw rugs at the top or bottom of the stairs. If you do have throw rugs, attach them to the floor with carpet tape. Make sure that you have a light switch at the top of the stairs and the bottom of the stairs. If you do not have them, ask someone to add them for you. What else can I do to help prevent falls? Wear shoes that: Do not have high heels. Have rubber bottoms. Are comfortable and fit you well. Are closed at the toe. Do not wear sandals. If you use a stepladder: Make sure that it is fully opened. Do not climb a closed stepladder. Make sure that  both sides of the stepladder are locked into place. Ask someone to hold it for you, if possible. Clearly mark and make sure that you can see: Any grab bars or handrails. First and last steps. Where the edge of each step is. Use tools that help you move around (mobility aids) if they are needed. These include: Canes. Walkers. Scooters. Crutches. Turn on the lights when you go into a dark area. Replace any light bulbs as soon as they burn out. Set up your furniture so you have a clear path. Avoid moving your furniture around. If any of your floors are uneven, fix them. If there are any pets around you, be aware of where they are. Review your medicines with your doctor. Some medicines can make you feel dizzy. This can increase your chance of falling. Ask your doctor what other things that you can do to help prevent falls. This information is not intended to replace advice given to you by your health care provider. Make sure you discuss any questions you have with your health care provider. Document Released: 11/19/2008 Document Revised: 07/01/2015 Document Reviewed: 02/27/2014 Elsevier Interactive Patient Education  2017 [employer].

## 2020-09-14 NOTE — Progress Notes (Signed)
Subjective:   Sandy Salinas is a 70 y.o. female who presents for Medicare Annual (Subsequent) preventive examination.  Virtual Visit via Telephone Note  I connected with  Sandy Salinas on 09/14/20 at  9:20 AM EDT by telephone and verified that I am speaking with the correct person using two identifiers.  Location: Patient: home Provider: [facility] Persons participating in the virtual visit: patient/Nurse Health Advisor   I discussed the limitations, risks, security and privacy concerns of performing an evaluation and management service by telephone and the availability of in person appointments. The patient expressed understanding and agreed to proceed.  Interactive audio and video telecommunications were attempted between this nurse and patient, however failed, due to patient having technical difficulties OR patient did not have access to video capability.  We continued and completed visit with audio only.  Some vital signs may be absent or patient reported.   Reather Littler, LPN   Review of Systems     Cardiac Risk Factors include: advanced age (>51men, >3 women);dyslipidemia;hypertension;diabetes mellitus     Objective:    There were no vitals filed for this visit. There is no height or weight on file to calculate BMI.  Advanced Directives 09/14/2020 11/21/2019 08/19/2019 11/14/2016 09/11/2016 06/26/2016 06/16/2016  Does Patient Have a Medical Advance Directive? Yes No No Yes No No;Yes Yes  Type of Estate agent of [place];Living will - - - - Midwife;Living will Healthcare Power of [employer];Living will  Does patient want to make changes to medical advance directive? - - - - - No - Patient declined -  Copy of Healthcare Power of Attorney in Chart? No - copy requested - - - - No - copy requested -  Would patient like information on creating a medical advance directive? - No - Patient declined Yes (MAU/Ambulatory/Procedural Areas -  Information given) - - No - Patient declined -    Current Medications (verified) Outpatient Encounter Medications as of 09/14/2020  Medication Sig   atorvastatin (LIPITOR) 80 MG tablet TAKE 1 TABLET (80 MG) BY MOUTH AT BEDTIME   Calcium Carbonate-Vitamin D (CALTRATE 600+D PO) Take by mouth daily.   chlorthalidone (HYGROTON) 25 MG tablet Take 1 tablet (25 mg total) by mouth daily. (Patient taking differently: Take 25 mg by mouth daily. Pt taking QHS)   Cholecalciferol (VITAMIN D) 2000 units tablet Pt taking daily QHS   clopidogrel (PLAVIX) 75 MG tablet Take 75 mg by mouth daily. Pt taking QHS   DULoxetine (CYMBALTA) 30 MG capsule Take 30 mg by mouth at bedtime.   gabapentin (NEURONTIN) 100 MG capsule Take 100 mg by mouth at bedtime.   lisinopril (ZESTRIL) 10 MG tablet Take 1 tablet (10 mg total) by mouth daily. (Patient taking differently: Take 10 mg by mouth daily. Pt taking QHS)   Magnesium 100 MG TABS Take by mouth.   metFORMIN (GLUCOPHAGE) 1000 MG tablet Take 0.5 tablets (500 mg total) by mouth 2 (two) times daily with a meal.   mirabegron ER (MYRBETRIQ) 25 MG TB24 tablet Take 1 tablet (25 mg total) by mouth daily. (Patient taking differently: Take 25 mg by mouth daily. Pt taking QHS)   Potassium 99 MG TABS Take by mouth.   TURMERIC PO Take 1 capsule by mouth at bedtime.   vitamin B-12 (CYANOCOBALAMIN) 100 MCG tablet Take by mouth daily. Pt taking 1000 mcg daily QHS   vitamin E 100 UNIT capsule Take by mouth daily. Pt taking QHS   Multiple Vitamin (  MULTIVITAMIN) tablet Take 1 tablet by mouth daily. Pt taking QHS   No facility-administered encounter medications on file as of 09/14/2020.    Allergies (verified) Codeine and Secukinumab   History: Past Medical History:  Diagnosis Date   Abdominal aortic ectasia (HCC) 05/19/2016   [place] April 2018, rescan in five years (April 2023)   Breast calcification seen on mammogram 12/01/2016   LEFT   Cataract    Diabetes mellitus without  complication (HCC)    Family history of abdominal aortic aneurysm (AAA) 05/12/2016   father   Hyperlipidemia    Hypertension    Obesity    Osteoporosis    Primary osteoarthritis of left knee    Psoriasis, unspecified    Seizures (HCC)    Past Surgical History:  Procedure Laterality Date   ABDOMINAL HYSTERECTOMY  1975   total   BREAST BIOPSY Right 04/09/2012   x 2, FIBROADENOMA WITH COARSE CALCIFICATIONS.done by byrnett   BREAST BIOPSY Right 09/04/2017   Affirm Bx- X clip/neg   CYSTECTOMY  2003   ROUX-EN-Y GASTRIC BYPASS  2009   TONSILLECTOMY  1970   TUBAL LIGATION  1975   Family History  Problem Relation Age of Onset   Alzheimer's disease Mother    Heart murmur Mother    Dementia Mother    Arthritis Mother    AAA (abdominal aortic aneurysm) Father    Heart disease Father    Asthma Sister    Hypertension Sister    Cancer Sister        breast   Breast cancer Sister        late 70's   Heart disease Brother    Hypertension Brother    Aneurysm Paternal Aunt    Lung cancer Paternal Uncle    Cancer Paternal Grandmother        possible, not sure what kind   Cancer Cousin    Social History   Socioeconomic History   Marital status: Married    Spouse name: Merchant navy officer   Number of children: 2   Years of education: Not on file   Highest education level: Some college, no degree  Occupational History   Occupation: retired  Tobacco Use   Smoking status: Never   Smokeless tobacco: Never  Vaping Use   Vaping Use: Never used  Substance and Sexual Activity   Alcohol use: No   Drug use: No   Sexual activity: Not Currently    Birth control/protection: Abstinence    Comment: Old Age  Other Topics Concern   Not on file  Social History Narrative   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    Social Determinants of Health   Financial Resource Strain: Low Risk    Difficulty of Paying Living Expenses: Not very hard  Food Insecurity: No Food Insecurity   Worried About  Programme researcher, broadcasting/film/video in the Last Year: Never true   Ran Out of Food in the Last Year: Never true  Transportation Needs: No Transportation Needs   Lack of Transportation (Medical): No   Lack of Transportation (Non-Medical): No  Physical Activity: Inactive   Days of Exercise per Week: 0 days   Minutes of Exercise per Session: 0 min  Stress: No Stress Concern Present   Feeling of Stress : Not at all  Social Connections: Moderately Isolated   Frequency of Communication with Friends and Family: More than three times a week   Frequency of Social Gatherings with Friends and Family: More than three  times a week   Attends Religious Services: Never   Active Member of Clubs or Organizations: No   Attends Engineer, structural: Never   Marital Status: Married    Tobacco Counseling Counseling given: Not Answered   Clinical Intake:  Pre-visit preparation completed: Yes  Pain : No/denies pain     Nutritional Risks: None Diabetes: Yes CBG done?: No Did pt. bring in CBG monitor from home?: No  How often do you need to have someone help you when you read instructions, pamphlets, or other written materials from your doctor or pharmacy?: 1 - Never  Nutrition Risk Assessment:  Has the patient had any N/V/D within the last 2 months?  No  Does the patient have any non-healing wounds?  No  Has the patient had any unintentional weight loss or weight gain?  No   Diabetes:  Is the patient diabetic?  Yes  If diabetic, was a CBG obtained today?  No  Did the patient bring in their glucometer from home?  No  How often do you monitor your CBG's? 1-2 times per day.   Financial Strains and Diabetes Management:  Are you having any financial strains with the device, your supplies or your medication? No .  Does the patient want to be seen by Chronic Care Management for management of their diabetes?  No  Would the patient like to be referred to a Nutritionist or for Diabetic Management?  No    Diabetic Exams:  Diabetic Eye Exam: Completed 08/20/19. Overdue for diabetic eye exam. Pt has been advised about the importance in completing this exam. A referral has been placed today. Message sent to referral coordinator for scheduling purposes. Advised pt to expect a call from office referred to regarding appt.  Diabetic Foot Exam: Completed 05/15/19. Pt has been advised about the importance in completing this exam. Pt is scheduled for diabetic foot exam on 10/21/20.    Interpreter Needed?: No  Information entered by :: Reather Littler LPN   Activities of Daily Living In your present state of health, do you have any difficulty performing the following activities: 09/14/2020 04/27/2020  Hearing? Malvin Johns  Comment information provided on local hearing clinics -  Vision? N N  Difficulty concentrating or making decisions? N Y  Walking or climbing stairs? N N  Dressing or bathing? N N  Doing errands, shopping? N N  Preparing Food and eating ? N -  Using the Toilet? N -  In the past six months, have you accidently leaked urine? N -  Do you have problems with loss of bowel control? N -  Managing your Medications? N -  Managing your Finances? N -  Housekeeping or managing your Housekeeping? N -  Some recent data might be hidden    Patient Care Team: Danelle Berry, PA-C as PCP - General (Family Medicine) Everette Rank, MD (Bariatrics) Sherle Poe, Vashti Hey, MD (Dermatology) Tedd Sias Marlana Salvage, MD as Physician Assistant (Endocrinology) Patterson Hammersmith, MD as Consulting Physician (Rheumatology) Lonell Face, MD as Consulting Physician (Neurology) Alwyn Pea, MD as Consulting Physician (Cardiology)  Indicate any recent Medical Services you may have received from other than [facility] providers in the past year (date may be approximate).     Assessment:   This is a routine wellness examination for [place].  Hearing/Vision screen Hearing Screening - Comments:: Pt c/o mild hearing difficulty;  declines hearing aids Vision Screening - Comments:: Annual vision screenings done at [facility] Dr. Melanie Crazier  Dietary issues and exercise activities discussed: Current Exercise Habits: The patient does not participate in regular exercise at present, Exercise limited by: None identified   Goals Addressed   None    Depression Screen PHQ 2/9 Scores 09/14/2020 04/27/2020 11/28/2019 11/12/2019 10/28/2019 09/23/2019 08/19/2019  PHQ - 2 Score 0 0 0 0 0 0 0  PHQ- 9 Score - 0 - 0 - 0 -    Fall Risk Fall Risk  09/14/2020 04/27/2020 11/28/2019 11/12/2019 10/28/2019  Falls in the past year? 0 1 1 0 0  Number falls in past yr: 0 0 0 0 0  Injury with Fall? 0 1 1 0 0  Risk for fall due to : No Fall Risks - - - -  Follow up Falls prevention discussed - - - Falls evaluation completed    FALL RISK PREVENTION PERTAINING TO THE HOME:  Any stairs in or around the home? Yes  If so, are there any without handrails? Yes  - 2 steps outside Home free of loose throw rugs in walkways, pet beds, electrical cords, etc? Yes  Adequate lighting in your home to reduce risk of falls? Yes   ASSISTIVE DEVICES UTILIZED TO PREVENT FALLS:  Life alert? No  Use of a cane, walker or w/c? No  Grab bars in the bathroom? Yes  Shower chair or bench in shower? No  Elevated toilet seat or a handicapped toilet? No   TIMED UP AND GO:  Was the test performed? No . Telephonic visit.   Cognitive Function: Normal cognitive status assessed by direct observation by this Nurse Health Advisor. No abnormalities found.       6CIT Screen 08/19/2019 05/15/2017  What Year? 0 points 0 points  What month? 0 points 0 points  What time? 0 points 0 points  Count back from 20 0 points 0 points  Months in reverse 0 points 0 points  Repeat phrase 0 points 0 points  Total Score 0 0    Immunizations Immunization History  Administered Date(s) Administered   Influenza, High Dose Seasonal PF 11/14/2016, 09/24/2018   Influenza, Seasonal,  Injecte, Preservative Fre 01/10/2007   Influenza,inj,Quad PF,6+ Mos 10/14/2015   Influenza-Unspecified 09/06/2013, 09/26/2019   PFIZER(Purple Top)SARS-COV-2 Vaccination 02/27/2019, 03/20/2019, 11/03/2019, 05/04/2020   Pneumococcal Conjugate-13 11/14/2016   Pneumococcal Polysaccharide-23 11/20/2017   Td 02/07/2004   Tdap 04/14/2016    TDAP status: Up to date  Flu Vaccine status: Up to date  Pneumococcal vaccine status: Up to date  Covid-19 vaccine status: Completed vaccines  Qualifies for Shingles Vaccine? Yes   Zostavax completed No   Shingrix Completed?: No.    Education has been provided regarding the importance of this vaccine. Patient has been advised to call insurance company to determine out of pocket expense if they have not yet received this vaccine. Advised may also receive vaccine at local pharmacy or Health Dept. Verbalized acceptance and understanding.  Screening Tests Health Maintenance  Topic Date Due   Zoster Vaccines- Shingrix (1 of 2) Never done   FOOT EXAM  05/14/2020   COVID-19 Vaccine (5 - Booster for Pfizer series) 09/03/2020   OPHTHALMOLOGY EXAM  08/19/2020   INFLUENZA VACCINE  09/06/2020   MAMMOGRAM  09/25/2020   HEMOGLOBIN A1C  10/28/2020   Fecal DNA (Cologuard)  07/08/2023   TETANUS/TDAP  04/15/2026   DEXA SCAN  Completed   Hepatitis C Screening  Completed   PNA vac Low Risk Adult  Completed   HPV VACCINES  Aged Out  Health Maintenance  Health Maintenance Due  Topic Date Due   Zoster Vaccines- Shingrix (1 of 2) Never done   FOOT EXAM  05/14/2020   COVID-19 Vaccine (5 - Booster for Pfizer series) 09/03/2020   OPHTHALMOLOGY EXAM  08/19/2020   INFLUENZA VACCINE  09/06/2020    Colorectal cancer screening: Type of screening: Cologuard. Completed 07/07/20. Repeat every 3 years  Mammogram status: Completed 09/26/19. Repeat every year  Bone Density status: Completed 09/12/18. Results reflect: Bone density results: OSTEOPENIA. Repeat every 2 years.  Scheduled for 09/14/20. Pt of Dr. Tedd Sias; reclast infusion.   Lung Cancer Screening: (Low Dose CT Chest recommended if Age 60-80 years, 30 pack-year currently smoking OR have quit w/in 15years.) does not qualify.   Additional Screening:  Hepatitis C Screening: does qualify; Completed 08/27/18  Vision Screening: Recommended annual ophthalmology exams for early detection of glaucoma and other disorders of the eye. Is the patient up to date with their annual eye exam?  Yes  Who is the provider or what is the name of the office in which the patient attends annual eye exams? [facility].   Dental Screening: Recommended annual dental exams for proper oral hygiene  Community Resource Referral / Chronic Care Management: CRR required this visit?  No   CCM required this visit?  No      Plan:     I have personally reviewed and noted the following in the patient's chart:   Medical and social history Use of alcohol, tobacco or illicit drugs  Current medications and supplements including opioid prescriptions.  Functional ability and status Nutritional status Physical activity Advanced directives List of other physicians Hospitalizations, surgeries, and ER visits in previous 12 months Vitals Screenings to include cognitive, depression, and falls Referrals and appointments  In addition, I have reviewed and discussed with patient certain preventive protocols, quality metrics, and best practice recommendations. A written personalized care plan for preventive services as well as general preventive health recommendations were provided to patient.     Reather Littler, LPN   06/11/2128   Nurse Notes: none

## 2020-09-20 ENCOUNTER — Encounter: Payer: Self-pay | Admitting: Family Medicine

## 2020-10-08 DIAGNOSIS — Z20822 Contact with and (suspected) exposure to covid-19: Secondary | ICD-10-CM | POA: Diagnosis not present

## 2020-10-18 ENCOUNTER — Other Ambulatory Visit: Payer: Self-pay

## 2020-10-18 ENCOUNTER — Ambulatory Visit
Admission: RE | Admit: 2020-10-18 | Discharge: 2020-10-18 | Disposition: A | Discharge status: Home / Self Care | Payer: Medicare Other | Source: Ambulatory Visit | Attending: Family Medicine | Admitting: Family Medicine

## 2020-10-18 DIAGNOSIS — Z1231 Encounter for screening mammogram for malignant neoplasm of breast: Secondary | ICD-10-CM | POA: Diagnosis not present

## 2020-10-18 DIAGNOSIS — M810 Age-related osteoporosis without current pathological fracture: Secondary | ICD-10-CM | POA: Diagnosis not present

## 2020-10-21 ENCOUNTER — Other Ambulatory Visit: Payer: Self-pay

## 2020-10-21 ENCOUNTER — Ambulatory Visit (INDEPENDENT_AMBULATORY_CARE_PROVIDER_SITE_OTHER): Payer: Medicare Other | Admitting: Family Medicine

## 2020-10-21 ENCOUNTER — Encounter: Payer: Self-pay | Admitting: Family Medicine

## 2020-10-21 VITALS — BP 136/76 | HR 82 | Temp 98.2°F | Resp 16 | Ht 64.0 in | Wt 175.1 lb

## 2020-10-21 DIAGNOSIS — I77811 Abdominal aortic ectasia: Secondary | ICD-10-CM

## 2020-10-21 DIAGNOSIS — E782 Mixed hyperlipidemia: Secondary | ICD-10-CM | POA: Diagnosis not present

## 2020-10-21 DIAGNOSIS — R350 Frequency of micturition: Secondary | ICD-10-CM | POA: Diagnosis not present

## 2020-10-21 DIAGNOSIS — N3941 Urge incontinence: Secondary | ICD-10-CM

## 2020-10-21 DIAGNOSIS — D75839 Thrombocytosis, unspecified: Secondary | ICD-10-CM

## 2020-10-21 DIAGNOSIS — L4050 Arthropathic psoriasis, unspecified: Secondary | ICD-10-CM

## 2020-10-21 DIAGNOSIS — E669 Obesity, unspecified: Secondary | ICD-10-CM

## 2020-10-21 DIAGNOSIS — D692 Other nonthrombocytopenic purpura: Secondary | ICD-10-CM | POA: Diagnosis not present

## 2020-10-21 DIAGNOSIS — E1169 Type 2 diabetes mellitus with other specified complication: Secondary | ICD-10-CM | POA: Diagnosis not present

## 2020-10-21 DIAGNOSIS — I10 Essential (primary) hypertension: Secondary | ICD-10-CM

## 2020-10-21 DIAGNOSIS — Z23 Encounter for immunization: Secondary | ICD-10-CM | POA: Diagnosis not present

## 2020-10-21 LAB — POCT URINALYSIS DIPSTICK
Bilirubin, UA: NEGATIVE
Blood, UA: NEGATIVE
Glucose, UA: NEGATIVE
Ketones, UA: NEGATIVE
Leukocytes, UA: NEGATIVE
Nitrite, UA: NEGATIVE
Odor: NORMAL
Protein, UA: NEGATIVE
Spec Grav, UA: 1.025 (ref 1.010–1.025)
Urobilinogen, UA: 0.2 E.U./dL
pH, UA: 6 (ref 5.0–8.0)

## 2020-10-21 NOTE — Progress Notes (Signed)
Name: Sandy Salinas   MRN: [id number]    DOB: [birth year]   Date:10/21/2020       Progress Note  Chief Complaint  Patient presents with   Hypertension   Diabetes   Hyperlipidemia     Subjective:   Sandy Salinas is a 70 y.o. female, presents to clinic for routine f/up  Hypertension:  Currently managed on lisinopril and HCTZ Pt reports  med compliance and denies any SE.   Blood pressure today is well controlled. BP Readings from Last 3 Encounters:  10/21/20 136/76  08/30/20 120/75  04/27/20 120/72   Pt denies CP, SOB, exertional sx, LE edema, palpitation, Ha's, visual disturbances, lightheadedness, hypotension, syncope.  Hyperlipidemia: Currently treated with atorvastatin, pt reports good med compliance Last Lipids: Lab Results  Component Value Date   CHOL 151 04/27/2020   HDL 39 (L) 04/27/2020   LDLCALC 77 04/27/2020   TRIG 265 (H) 04/27/2020   CHOLHDL 3.9 04/27/2020   - Denies: Chest pain, shortness of breath, myalgias, claudication     DM uncontrolled - on metformin Lab Results  Component Value Date   HGBA1C 8.0 (H) 04/27/2020   DM eye exam [facility] in Nov 2022 Foot exam - done today On statin and lisinopril  Urinary frequency on myrbetriq     Current Outpatient Medications:    atorvastatin (LIPITOR) 80 MG tablet, TAKE 1 TABLET (80 MG) BY MOUTH AT BEDTIME, Disp: 90 tablet, Rfl: 3   Calcium Carbonate-Vitamin D (CALTRATE 600+D PO), Take by mouth daily., Disp: , Rfl:    chlorthalidone (HYGROTON) 25 MG tablet, Take 1 tablet (25 mg total) by mouth daily. (Patient taking differently: Take 25 mg by mouth daily. Pt taking QHS), Disp: 90 tablet, Rfl: 3   Cholecalciferol (VITAMIN D) 2000 units tablet, Pt taking daily QHS, Disp: , Rfl:    clopidogrel (PLAVIX) 75 MG tablet, Take 75 mg by mouth daily. Pt taking QHS, Disp: , Rfl:    gabapentin (NEURONTIN) 100 MG capsule, Take 100 mg by mouth at bedtime., Disp: , Rfl:    lisinopril (ZESTRIL) 10 MG  tablet, Take 1 tablet (10 mg total) by mouth daily. (Patient taking differently: Take 10 mg by mouth daily. Pt taking QHS), Disp: 90 tablet, Rfl: 3   Magnesium 100 MG TABS, Take by mouth., Disp: , Rfl:    metFORMIN (GLUCOPHAGE) 1000 MG tablet, Take 0.5 tablets (500 mg total) by mouth 2 (two) times daily with a meal., Disp: 180 tablet, Rfl: 2   mirabegron ER (MYRBETRIQ) 25 MG TB24 tablet, Take 1 tablet (25 mg total) by mouth daily. (Patient taking differently: Take 25 mg by mouth daily. Pt taking QHS), Disp: 30 tablet, Rfl: 11   Multiple Vitamin (MULTIVITAMIN) tablet, Take 1 tablet by mouth daily. Pt taking QHS, Disp: , Rfl:    Potassium 99 MG TABS, Take by mouth., Disp: , Rfl:    TURMERIC PO, Take 1 capsule by mouth at bedtime., Disp: , Rfl:    vitamin B-12 (CYANOCOBALAMIN) 100 MCG tablet, Take by mouth daily. Pt taking 1000 mcg daily QHS, Disp: , Rfl:    vitamin E 100 UNIT capsule, Take by mouth daily. Pt taking QHS, Disp: , Rfl:    DULoxetine (CYMBALTA) 30 MG capsule, Take 30 mg by mouth at bedtime., Disp: , Rfl:   Patient Active Problem List   Diagnosis Date Noted   Contusion of right chest wall 11/28/2019   Alzheimer's dementia without behavioral disturbance (HCC) 11/17/2019   Family history of  Alzheimer's disease 11/17/2019   Urge incontinence 11/17/2019   Urinary frequency 11/17/2019   Spells of decreased attentiveness 10/28/2019   Trochanteric bursitis of left hip 05/20/2019   Gait abnormality 03/25/2019   Vertigo 03/25/2019   Microalbuminuria due to type 2 diabetes mellitus (HCC) 02/20/2018   Obesity (BMI 30.0-34.9) 02/19/2017   Breast calcification seen on mammogram 12/01/2016   Age-related osteoporosis without current pathological fracture 11/08/2016   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   Abdominal aortic ectasia (HCC) 05/19/2016   Abnormal EKG 05/12/2016   Localized osteoarthritis of lumbar spine 12/13/2015   Xerosis of skin 12/13/2015   Psoriatic arthritis (HCC)  11/18/2015   Lumbar radiculopathy, acute 10/14/2015   Vitamin D deficiency 08/18/2014   Hyperlipidemia    Hypertension    Primary osteoarthritis of left knee    Diabetes mellitus type 2 in obese (HCC) 07/24/2014   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 NEGATIVE FOR ATYPIA AND MALIGNANCY   CYSTECTOMY  2003   ROUX-EN-Y GASTRIC BYPASS  2009   TONSILLECTOMY  1970   TUBAL LIGATION  1975    Family History  Problem Relation Age of Onset   Alzheimer's disease Mother    Heart murmur Mother    Dementia Mother    Arthritis Mother    AAA (abdominal aortic aneurysm) Father    Heart disease Father    Asthma Sister    Hypertension Sister    Cancer Sister        breast   Breast cancer Sister        late 2's   Heart disease Brother    Hypertension Brother    Aneurysm Paternal Aunt    Lung cancer Paternal Uncle    Cancer Paternal Grandmother        possible, not sure what kind   Cancer Cousin     Social History   Tobacco Use   Smoking status: Never   Smokeless tobacco: Never  Vaping Use   Vaping Use: Never used  Substance Use Topics   Alcohol use: No   Drug use: No     Allergies  Allergen Reactions   Codeine Nausea Only   Secukinumab Other (See Comments)    Made her throat raw for 2 months so is not taking this now    Health Maintenance  Topic Date Due   Zoster Vaccines- Shingrix (1 of 2) Never done   FOOT EXAM  05/14/2020   OPHTHALMOLOGY EXAM  08/19/2020   COVID-19 Vaccine (5 - Booster for Pfizer series) 09/03/2020   MAMMOGRAM  09/25/2020   INFLUENZA VACCINE  05/06/2021 (Originally 09/06/2020)   HEMOGLOBIN A1C  10/28/2020   Fecal DNA (Cologuard)  07/08/2023   TETANUS/TDAP   04/15/2026   DEXA SCAN  Completed   Hepatitis C Screening  Completed   PNA vac Low Risk Adult  Completed   HPV VACCINES  Aged Out    Chart Review Today: I personally reviewed active problem list, medication list, allergies, family history, social history, health maintenance, notes from last encounter, lab results, imaging with the patient/caregiver today.   Review of Systems  Constitutional: Negative.   HENT: Negative.    Eyes: Negative.   Respiratory: Negative.    Cardiovascular: Negative.  Gastrointestinal: Negative.   Endocrine: Negative.   Genitourinary: Negative.   Musculoskeletal: Negative.   Skin: Negative.   Allergic/Immunologic: Negative.   Neurological: Negative.   Hematological: Negative.   Psychiatric/Behavioral: Negative.    All other systems reviewed and are negative.   Objective:   Vitals:   10/21/20 1412  BP: 136/76  Pulse: 82  Resp: 16  Temp: 98.2 F (36.8 C)  SpO2: 98%  Weight: 175 lb 1.6 oz (79.4 kg)  Height: 5\' 4"  (1.626 m)    Body mass index is 30.06 kg/m.  Physical Exam Vitals and nursing note reviewed.  Constitutional:      General: She is not in acute distress.    Appearance: Normal appearance. She is well-developed. She is not ill-appearing, toxic-appearing or diaphoretic.     Interventions: Face mask in place.  HENT:     Head: Normocephalic and atraumatic.     Right Ear: External ear normal.     Left Ear: External ear normal.  Eyes:     General: Lids are normal. No scleral icterus.       Right eye: No discharge.        Left eye: No discharge.     Conjunctiva/sclera: Conjunctivae normal.  Neck:     Trachea: Phonation normal. No tracheal deviation.  Cardiovascular:     Rate and Rhythm: Normal rate and regular rhythm.     Pulses: Normal pulses.          Radial pulses are 2+ on the right side and 2+ on the left side.       Posterior tibial pulses are 2+ on the right side and 2+ on the left side.     Heart sounds: Normal heart  sounds. No murmur heard.   No friction rub. No gallop.  Pulmonary:     Effort: Pulmonary effort is normal. No respiratory distress.     Breath sounds: Normal breath sounds. No stridor. No wheezing, rhonchi or rales.  Chest:     Chest wall: No tenderness.  Abdominal:     General: Bowel sounds are normal. There is no distension.     Palpations: Abdomen is soft.  Musculoskeletal:     Right lower leg: No edema.     Left lower leg: No edema.  Skin:    General: Skin is warm and dry.     Coloration: Skin is not jaundiced or pale.     Findings: No rash.  Neurological:     Mental Status: She is alert.     Motor: No abnormal muscle tone.     Gait: Gait normal.  Psychiatric:        Mood and Affect: Mood normal.        Speech: Speech normal.        Behavior: Behavior normal.        Assessment & Plan:   1. Diabetes mellitus type 2 in obese (HCC) Uncontrolled, only on metformin - Hemoglobin A1C - COMPLETE METABOLIC PANEL WITH GFR - Urine Microalbumin w/creat. ratio  2. Mixed hyperlipidemia Last lipids reviewed, tolerating statin - atorvastatin (LIPITOR) 80 MG tablet; TAKE 1 TABLET (80 MG) BY MOUTH AT BEDTIME  Dispense: 90 tablet; Refill: 3 - COMPLETE METABOLIC PANEL WITH GFR  3. Urinary frequency  - mirabegron ER (MYRBETRIQ) 25 MG TB24 tablet; Take 1 tablet (25 mg total) by mouth daily.  Dispense: 30 tablet; Refill: 11 - POCT urinalysis dipstick - Urine Microalbumin w/creat. ratio  4. Urge incontinence Referral to urology again  recommended - mirabegron ER (MYRBETRIQ) 25 MG TB24 tablet; Take 1 tablet (25 mg total) by mouth daily.  Dispense: 30 tablet; Refill: 11 - POCT urinalysis dipstick - Urine Microalbumin w/creat. ratio  5. Primary hypertension BP Readings from Last 3 Encounters:  10/21/20 136/76  08/30/20 120/75  04/27/20 120/72  Stable, well controlled on current meds - COMPLETE METABOLIC PANEL WITH GFR  6. Thrombocytosis Recheck labs, monitoring - CBC  w/Diff/Platelet  7. Senile purpura (HCC) Reassures, stable - CBC w/Diff/Platelet   8. Psoriatic arthritis (HCC) Sx fairly controlled with cymbalta and gabapentin  9. Abdominal aortic ectasia (HCC) Monitoring with specialists and imaging, on statin BP controlled    Return in about 6 months (around 04/20/2021) for Routine follow-up, sooner if A1C uncontroled.   Danelle Berry, PA-C 10/21/20 2:29 PM

## 2020-10-22 LAB — CBC WITH DIFFERENTIAL/PLATELET
Absolute Monocytes: 670 {cells}/uL (ref 200–950)
Basophils Absolute: 74 {cells}/uL (ref 0–200)
Basophils Relative: 0.8 %
Eosinophils Absolute: 158 {cells}/uL (ref 15–500)
Eosinophils Relative: 1.7 %
HCT: 40.3 % (ref 35.0–45.0)
Hemoglobin: 12.8 g/dL (ref 11.7–15.5)
Lymphs Abs: 3590 {cells}/uL (ref 850–3900)
MCH: 27 pg (ref 27.0–33.0)
MCHC: 31.8 g/dL — ABNORMAL LOW (ref 32.0–36.0)
MCV: 85 fL (ref 80.0–100.0)
MPV: 9.5 fL (ref 7.5–12.5)
Monocytes Relative: 7.2 %
Neutro Abs: 4808 {cells}/uL (ref 1500–7800)
Neutrophils Relative %: 51.7 %
Platelets: 488 Thousand/uL — ABNORMAL HIGH (ref 140–400)
RBC: 4.74 Million/uL (ref 3.80–5.10)
RDW: 13.8 % (ref 11.0–15.0)
Total Lymphocyte: 38.6 %
WBC: 9.3 Thousand/uL (ref 3.8–10.8)

## 2020-10-22 LAB — COMPLETE METABOLIC PANEL WITH GFR
AG Ratio: 2 (calc) (ref 1.0–2.5)
ALT: 11 U/L (ref 6–29)
AST: 22 U/L (ref 10–35)
Albumin: 4.4 g/dL (ref 3.6–5.1)
Alkaline phosphatase (APISO): 50 U/L (ref 37–153)
BUN: 13 mg/dL (ref 7–25)
CO2: 30 mmol/L (ref 20–32)
Calcium: 9.4 mg/dL (ref 8.6–10.4)
Chloride: 97 mmol/L — ABNORMAL LOW (ref 98–110)
Creat: 0.7 mg/dL (ref 0.50–1.05)
Globulin: 2.2 g/dL (calc) (ref 1.9–3.7)
Glucose, Bld: 140 mg/dL — ABNORMAL HIGH (ref 65–99)
Potassium: 3.9 mmol/L (ref 3.5–5.3)
Sodium: 134 mmol/L — ABNORMAL LOW (ref 135–146)
Total Bilirubin: 0.3 mg/dL (ref 0.2–1.2)
Total Protein: 6.6 g/dL (ref 6.1–8.1)
eGFR: 94 mL/min/{1.73_m2} (ref >=60)

## 2020-10-22 LAB — MICROALBUMIN / CREATININE URINE RATIO
Creatinine, Urine: 17 mg/dL — ABNORMAL LOW (ref 20–275)
Microalb, Ur: <0.2 mg/dL

## 2020-10-22 LAB — HEMOGLOBIN A1C
Hgb A1c MFr Bld: 8.3 %{Hb} — ABNORMAL HIGH
Mean Plasma Glucose: 192 mg/dL
eAG (mmol/L): 10.6 mmol/L

## 2020-10-24 ENCOUNTER — Encounter: Payer: Self-pay | Admitting: Family Medicine

## 2020-11-04 ENCOUNTER — Telehealth: Payer: Self-pay | Admitting: Family Medicine

## 2020-11-04 MED ORDER — ATORVASTATIN CALCIUM 80 MG PO TABS: ORAL_TABLET | ORAL | 3 refills | Status: DC

## 2020-11-04 MED ORDER — MIRABEGRON ER 25 MG PO TB24: 25.0000 mg | ORAL_TABLET | Freq: Every day | ORAL | 11 refills | Status: DC

## 2020-11-04 NOTE — Telephone Encounter (Signed)
Patient states mirabegron ER (MYRBETRIQ) 25 MG  cost $90, inquiring if PCP has any discount coupons because she is not paying $90, please advise    [id number] [place], [place] - [address] AT [facility] OF [address]  [address], [place] [address]

## 2020-11-05 NOTE — Telephone Encounter (Signed)
Spoke with pt and gave her the website to check for saving card

## 2020-11-10 DIAGNOSIS — M79672 Pain in left foot: Secondary | ICD-10-CM | POA: Diagnosis not present

## 2020-11-10 DIAGNOSIS — S93602A Unspecified sprain of left foot, initial encounter: Secondary | ICD-10-CM | POA: Insufficient documentation

## 2020-11-10 DIAGNOSIS — M7521 Bicipital tendinitis, right shoulder: Secondary | ICD-10-CM | POA: Insufficient documentation

## 2020-11-10 DIAGNOSIS — M25511 Pain in right shoulder: Secondary | ICD-10-CM | POA: Diagnosis not present

## 2020-11-10 DIAGNOSIS — M7591 Shoulder lesion, unspecified, right shoulder: Secondary | ICD-10-CM | POA: Diagnosis not present

## 2020-11-24 DIAGNOSIS — S93602A Unspecified sprain of left foot, initial encounter: Secondary | ICD-10-CM | POA: Diagnosis not present

## 2020-12-03 ENCOUNTER — Ambulatory Visit: Payer: Medicare Other | Admitting: Family Medicine

## 2020-12-14 ENCOUNTER — Encounter: Payer: Self-pay | Admitting: Internal Medicine

## 2020-12-14 ENCOUNTER — Other Ambulatory Visit: Payer: Self-pay

## 2020-12-14 ENCOUNTER — Ambulatory Visit (INDEPENDENT_AMBULATORY_CARE_PROVIDER_SITE_OTHER): Payer: Medicare Other | Admitting: Internal Medicine

## 2020-12-14 VITALS — BP 134/82 | HR 76 | Temp 97.7°F | Resp 16 | Ht 64.0 in | Wt 173.0 lb

## 2020-12-14 DIAGNOSIS — I10 Essential (primary) hypertension: Secondary | ICD-10-CM

## 2020-12-14 DIAGNOSIS — M79631 Pain in right forearm: Secondary | ICD-10-CM | POA: Diagnosis not present

## 2020-12-14 DIAGNOSIS — E669 Obesity, unspecified: Secondary | ICD-10-CM

## 2020-12-14 DIAGNOSIS — E1169 Type 2 diabetes mellitus with other specified complication: Secondary | ICD-10-CM

## 2020-12-14 DIAGNOSIS — E782 Mixed hyperlipidemia: Secondary | ICD-10-CM

## 2020-12-14 NOTE — Progress Notes (Addendum)
Established Patient Office Visit  Subjective:  Patient ID: Sandy Salinas, female    DOB: 1950/07/08  Age: 70 y.o. MRN: [id number]  CC:  Chief Complaint  Patient presents with  . Diabetes    uncontrolled    HPI Sandy Salinas presents for DM follow up. Chronic medical conditions include HTN, HLD, DM2. States she has been doing well since last visit.   Diabetes, Type 2: -Last A1c 10/21/20 8.3%, 8.0% time before -Medications: Metformin 500 BID -Patient is compliant with the above medications and reports no side effects.  -Checking BG at home: No not checking, can tell when it gets low  -Lowest home BG since last visit: 80, causes shaking -Diet: stays away from bread and most carbs -Exercise: No regular exercise but active, does yard work -Eye exam: June 2022 -Foot exam: Today -Microalbumin: 10/21/20 normal -Statin: Lipitor 80 mg  -PNA vaccine: Prevnar 13 in 2018, Prevnar 23 in 2019 -Denies symptoms of hypoglycemia, polyuria, polydipsia, numbness extremities, foot ulcers/trauma.   Hypertension: -Medications: Chlorthalidone 25 mg. Lisinopril 10 mg  -Patient is compliant with above medications and reports no side effects. -Checking BP at home (average): 125/70 -Denies any SOB, CP, vision changes, LE edema or symptoms of hypotension  HLD: -Medications: Lipitor 80 mg -Patient is compliant with above medications and reports no side effects.  -Last lipid panel: 3/22 TC 151, HDL 39, Triglycerides 265, LDL 77   Is complaining of right forearm pain x 2-3 months. No history of trauma. No elbow or wrist pain, no issues with ROM. Nothing particular makes it worse, comes and goes and is aching in nature. Takes Tylenol for it that helps somewhat. No other joint or muscle pains.    Past Medical History:  Diagnosis Date  . Abdominal aortic ectasia ([place]) 05/19/2016   [place] April 2018, rescan in five years (April 2023)  . Breast calcification seen on mammogram 12/01/2016   LEFT  .  Cataract   . Diabetes mellitus without complication ([place])   . Family history of abdominal aortic aneurysm (AAA) 05/12/2016   father  . Hyperlipidemia   . Hypertension   . Obesity   . Osteoporosis   . Primary osteoarthritis of left knee   . Psoriasis, unspecified   . Seizures ([place])     Past Surgical History:  Procedure Laterality Date  . ABDOMINAL HYSTERECTOMY  1975   total  . BREAST BIOPSY Right 04/09/2012   x 2, FIBROADENOMA WITH COARSE CALCIFICATIONS.done by byrnett  . BREAST BIOPSY Left 09/04/2017   Affirm Bx- X clip, FIBROADENOMATOUS CHANGE WITH CALCIFICATIONS. NEGATIVE FOR ATYPIA AND MALIGNANCY  . CYSTECTOMY  2003  . ROUX-EN-Y GASTRIC BYPASS  2009  . TONSILLECTOMY  1970  . TUBAL LIGATION  1975    Family History  Problem Relation Age of Onset  . Alzheimer's disease Mother   . Heart murmur Mother   . Dementia Mother   . Arthritis Mother   . AAA (abdominal aortic aneurysm) Father   . Heart disease Father   . Asthma Sister   . Hypertension Sister   . Cancer Sister        breast  . Breast cancer Sister        late 77's  . Heart disease Brother   . Hypertension Brother   . Aneurysm Paternal Aunt   . Lung cancer Paternal Uncle   . Cancer Paternal Grandmother        possible, not sure what kind  . Cancer Cousin  Social History   Socioeconomic History  . Marital status: Married    Spouse name: Elta Guadeloupe  . Number of children: 2  . Years of education: Not on file  . Highest education level: Some college, no degree  Occupational History  . Occupation: retired  Tobacco Use  . Smoking status: Never  . Smokeless tobacco: Never  Vaping Use  . Vaping Use: Never used  Substance and Sexual Activity  . Alcohol use: No  . Drug use: No  . Sexual activity: Not Currently    Birth control/protection: Abstinence    Comment: Old Age  Other Topics Concern  . 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    Social  Determinants of Health   Financial Resource Strain: Low Risk   . Difficulty of Paying Living Expenses: Not very hard  Food Insecurity: No Food Insecurity  . Worried About Charity fundraiser in the Last Year: Never true  . Ran Out of Food in the Last Year: Never true  Transportation Needs: No Transportation Needs  . Lack of Transportation (Medical): No  . Lack of Transportation (Non-Medical): No  Physical Activity: Inactive  . Days of Exercise per Week: 0 days  . Minutes of Exercise per Session: 0 min  Stress: No Stress Concern Present  . Feeling of Stress : Not at all  Social Connections: Moderately Isolated  . Frequency of Communication with Friends and Family: More than three times a week  . Frequency of Social Gatherings with Friends and Family: More than three times a week  . Attends Religious Services: Never  . Active Member of Clubs or Organizations: No  . Attends Archivist Meetings: Never  . Marital Status: Married  Human resources officer Violence: Not At Risk  . Fear of Current or Ex-Partner: No  . Emotionally Abused: No  . Physically Abused: No  . Sexually Abused: No    Outpatient Medications Prior to Visit  Medication Sig Dispense Refill  . atorvastatin (LIPITOR) 80 MG tablet TAKE 1 TABLET (80 MG) BY MOUTH AT BEDTIME 90 tablet 3  . Calcium Carbonate-Vitamin D (CALTRATE 600+D PO) Take by mouth daily.    . chlorthalidone (HYGROTON) 25 MG tablet Take 1 tablet (25 mg total) by mouth daily. (Patient taking differently: Take 25 mg by mouth daily. Pt taking QHS) 90 tablet 3  . Cholecalciferol (VITAMIN D) 2000 units tablet Pt taking daily QHS    . clopidogrel (PLAVIX) 75 MG tablet Take 75 mg by mouth daily. Pt taking QHS    . DULoxetine (CYMBALTA) 30 MG capsule Take 30 mg by mouth at bedtime.    . gabapentin (NEURONTIN) 100 MG capsule Take 100 mg by mouth at bedtime.    Marland Kitchen lisinopril (ZESTRIL) 10 MG tablet Take 1 tablet (10 mg total) by mouth daily. (Patient taking  differently: Take 10 mg by mouth daily. Pt taking QHS) 90 tablet 3  . Magnesium 100 MG TABS Take by mouth.    . metFORMIN (GLUCOPHAGE) 1000 MG tablet Take 0.5 tablets (500 mg total) by mouth 2 (two) times daily with a meal. 180 tablet 2  . mirabegron ER (MYRBETRIQ) 25 MG TB24 tablet Take 1 tablet (25 mg total) by mouth daily. 30 tablet 11  . Multiple Vitamin (MULTIVITAMIN) tablet Take 1 tablet by mouth daily. Pt taking QHS    . Potassium 99 MG TABS Take by mouth.    . TURMERIC PO Take 1 capsule by  mouth at bedtime.    . vitamin B-12 (CYANOCOBALAMIN) 100 MCG tablet Take by mouth daily. Pt taking 1000 mcg daily QHS    . vitamin E 100 UNIT capsule Take by mouth daily. Pt taking QHS     No facility-administered medications prior to visit.    Allergies  Allergen Reactions  . Codeine Nausea Only  . Secukinumab Other (See Comments)    Made her throat raw for 2 months so is not taking this now    ROS Review of Systems  Constitutional:  Negative for chills and fever.  Eyes:  Negative for visual disturbance.  Respiratory:  Negative for cough and shortness of breath.   Cardiovascular:  Negative for chest pain.  Gastrointestinal:  Negative for abdominal pain, diarrhea, nausea and vomiting.  Musculoskeletal:  Positive for myalgias. Negative for arthralgias and joint swelling.  Skin: Negative.   Neurological:  Negative for dizziness, weakness, numbness and headaches.  Hematological:  Bruises/bleeds easily.     Objective:    Physical Exam Constitutional:      Appearance: Normal appearance.  HENT:     Head: Normocephalic and atraumatic.  Eyes:     Conjunctiva/sclera: Conjunctivae normal.  Cardiovascular:     Rate and Rhythm: Normal rate and regular rhythm.     Pulses:          Dorsalis pedis pulses are 2+ on the right side and 2+ on the left side.  Pulmonary:     Effort: Pulmonary effort is normal.     Breath sounds: Normal breath sounds.  Musculoskeletal:        General: Tenderness  present. Normal range of motion.     Right lower leg: No edema.     Left lower leg: No edema.     Right foot: Normal range of motion. No deformity, bunion, Charcot foot, foot drop or prominent metatarsal heads.     Left foot: Normal range of motion. No deformity, bunion, Charcot foot, foot drop or prominent metatarsal heads.     Comments: Right elbow and wrist with appropriate ROM, pain to palpation along dorsal aspect of right forearm, large hematoma present but she states that is new and the pain has been ongoing for months  Feet:     Right foot:     Protective Sensation: 8 sites tested.  8 sites sensed.     Skin integrity: Skin integrity normal.     Toenail Condition: Right toenails are normal.     Left foot:     Protective Sensation: 8 sites tested.  8 sites sensed.     Skin integrity: Skin integrity normal.     Toenail Condition: Left toenails are normal.  Skin:    General: Skin is warm and dry.  Neurological:     General: No focal deficit present.     Mental Status: She is alert. Mental status is at baseline.  Psychiatric:        Mood and Affect: Mood normal.        Behavior: Behavior normal.    BP 134/82   Pulse 76   Temp 97.7 F (36.5 C)   Resp 16   Ht '5\' 4"'  (1.626 m)   Wt 173 lb (78.5 kg)   SpO2 98%   BMI 29.70 kg/m  Wt Readings from Last 3 Encounters:  10/21/20 175 lb 1.6 oz (79.4 kg)  04/27/20 168 lb 14.4 oz (76.6 kg)  11/28/19 161 lb 9.6 oz (73.3 kg)     Health Maintenance Due  Topic Date Due  . Zoster Vaccines- Shingrix (1 of 2) Never done  . OPHTHALMOLOGY EXAM  08/19/2020  . COVID-19 Vaccine (6 - Booster for Pfizer series) 12/16/2020    There are no preventive care reminders to display for this patient.  Lab Results  Component Value Date   TSH 1.75 04/27/2020   Lab Results  Component Value Date   WBC 9.3 10/21/2020   HGB 12.8 10/21/2020   HCT 40.3 10/21/2020   MCV 85.0 10/21/2020   PLT 488 (H) 10/21/2020   Lab Results  Component Value Date    NA 134 (L) 10/21/2020   K 3.9 10/21/2020   CO2 30 10/21/2020   GLUCOSE 140 (H) 10/21/2020   BUN 13 10/21/2020   CREATININE 0.70 10/21/2020   BILITOT 0.3 10/21/2020   ALKPHOS 76 06/06/2016   AST 22 10/21/2020   ALT 11 10/21/2020   PROT 6.6 10/21/2020   ALBUMIN 4.1 06/06/2016   CALCIUM 9.4 10/21/2020   EGFR 94 10/21/2020   Lab Results  Component Value Date   CHOL 151 04/27/2020   Lab Results  Component Value Date   HDL 39 (L) 04/27/2020   Lab Results  Component Value Date   LDLCALC 77 04/27/2020   Lab Results  Component Value Date   TRIG 265 (H) 04/27/2020   Lab Results  Component Value Date   CHOLHDL 3.9 04/27/2020   Lab Results  Component Value Date   HGBA1C 8.3 (H) 10/21/2020      Assessment & Plan:   1. Diabetes mellitus type 2 in obese [facility]): Uncontrolled with last A1c 8.3%, for now we will increase Metformin to 1000 mg BID. Follow up in 4-6 weeks for recheck A1c.   2. Primary hypertension: Stable, no medication changes made.   3. Mixed hyperlipidemia: Stable, no medication changes made.   4. Right forearm pain: Most likely arthritis, recommend topical anti-inflammatory gel and consider x-ray if still present at follow up.    Follow-up: Return in about 6 weeks (around 01/25/2021).    Teodora Medici, DO

## 2020-12-14 NOTE — Patient Instructions (Addendum)
It was great seeing you today!  Plan discussed at today's visit: -Increase Metformin to 1000 mg twice a day -Continue working on lifestyle management -Continue all other medications but can stop Mybetriq   -Try topical Voltaren for forearm pain  Follow up in: 1 (after 01/20/21) to recheck A1c  Take care and let us know if you have any questions or concerns prior to your next visit.  Dr. Caralee Ates

## 2020-12-15 DIAGNOSIS — S93602A Unspecified sprain of left foot, initial encounter: Secondary | ICD-10-CM | POA: Diagnosis not present

## 2020-12-22 ENCOUNTER — Telehealth (INDEPENDENT_AMBULATORY_CARE_PROVIDER_SITE_OTHER): Payer: Medicare Other | Admitting: Family Medicine

## 2020-12-22 ENCOUNTER — Telehealth: Payer: Self-pay

## 2020-12-22 ENCOUNTER — Encounter: Payer: Self-pay | Admitting: Family Medicine

## 2020-12-22 DIAGNOSIS — J069 Acute upper respiratory infection, unspecified: Secondary | ICD-10-CM | POA: Diagnosis not present

## 2020-12-22 MED ORDER — BENZONATATE 200 MG PO CAPS: 200.0000 mg | ORAL_CAPSULE | Freq: Two times a day (BID) | ORAL | 0 refills | Status: DC | PRN

## 2020-12-22 NOTE — Progress Notes (Signed)
Virtual Visit via Video Note  I connected with Sandy Salinas on 12/22/20 at  9:40 AM EST by a video enabled telemedicine application and verified that I am speaking with the correct person using two identifiers.  Location: Patient: car Provider: [facility]   I discussed the limitations of evaluation and management by telemedicine and the availability of in person appointments. The patient expressed understanding and agreed to proceed.  History of Present Illness:  UPPER RESPIRATORY TRACT INFECTION - symptom onset 12/18/20 - COVID negative 11/11  Fever: no Cough: yes Shortness of breath: no Wheezing: no Chest pain: no Chest congestion: no Nasal congestion: yes Runny nose: yes Sore throat: yes Headache: yes Ear pain: yes left Vomiting: no Rash: no Sick contacts: yes Relief with OTC cold/cough medications:  a little   Treatments attempted: Antihistamine, tylenol, alka-seltzer cold/flu   Observations/Objective:  Patient had trouble connecting to video visit, entirety of visit conducted over the phone.  Speaks in full sentences, no respiratory distress.   Assessment and Plan:  VIRAL URI Doing well with mild sx. Will test for COVID, flu. Would be a candidate for COVID treatment if positive. Reviewed OTC symptom relief, self-quarantine guidelines, and emergency precautions.     I discussed the assessment and treatment plan with the patient. The patient was provided an opportunity to ask questions and all were answered. The patient agreed with the plan and demonstrated an understanding of the instructions.   The patient was advised to call back or seek an in-person evaluation if the symptoms worsen or if the condition fails to improve as anticipated.  I provided 6 minutes of non-face-to-face time during this encounter.   Caro Laroche, DO

## 2020-12-22 NOTE — Telephone Encounter (Signed)
Copied from CRM [id number]. Topic: General - Other >> Dec 22, 2020  4:23 PM Pawlus, Maxine Glenn A wrote: Reason for CRM: Pt called in requesting to go over her lab results as soon as they are available.

## 2020-12-22 NOTE — Patient Instructions (Signed)
It was great to see you!  Our plans for today:  - See below for self-isolation guidelines. You may end your quarantine if your test is negative, or if positive, once you are 10 days from symptom onset and fever free for 24 hours without use of tylenol or ibuprofen. Wear a well-fitting N95 mask if you have to go out and about. - If your test is positive, we will be prescribing an antiviral medication to help treat COVID. See WarningSounds.pl for more information about the medication. - Certainly, if you are having difficulties breathing or unable to keep down fluids, go to the Emergency Department.   Take care and seek immediate care sooner if you develop any concerns.   Dr. Linwood Dibbles     Person Under Monitoring Name: Sandy Salinas  Location: [address] [place] [address]   Infection Prevention Recommendations for Individuals Confirmed to have, or Being Evaluated for, 2019 Novel Coronavirus (COVID-19) Infection Who Receive Care at Home  Individuals who are confirmed to have, or are being evaluated for, COVID-19 should follow the prevention steps below until a healthcare provider or local or state health department says they can return to normal activities.  Stay home except to get medical care You should restrict activities outside your home, except for getting medical care. Do not go to work, school, or public areas, and do not use public transportation or taxis.  Call ahead before visiting your doctor Before your medical appointment, call the healthcare provider and tell them that you have, or are being evaluated for, COVID-19 infection. This will help the healthcare provider's office take steps to keep other people from getting infected. Ask your healthcare provider to call the local or state health department.  Monitor your symptoms Seek prompt medical attention if your illness is worsening (e.g., difficulty breathing). Before going to your  medical appointment, call the healthcare provider and tell them that you have, or are being evaluated for, COVID-19 infection. Ask your healthcare provider to call the local or state health department.  Wear a facemask You should wear a facemask that covers your nose and mouth when you are in the same room with other people and when you visit a healthcare provider. People who live with or visit you should also wear a facemask while they are in the same room with you.  Separate yourself from other people in your home As much as possible, you should stay in a different room from other people in your home. Also, you should use a separate bathroom, if available.  Avoid sharing household items You should not share dishes, drinking glasses, cups, eating utensils, towels, bedding, or other items with other people in your home. After using these items, you should wash them thoroughly with soap and water.  Cover your coughs and sneezes Cover your mouth and nose with a tissue when you cough or sneeze, or you can cough or sneeze into your sleeve. Throw used tissues in a lined trash can, and immediately wash your hands with soap and water for at least 20 seconds or use an alcohol-based hand rub.  Wash your [employer] your hands often and thoroughly with soap and water for at least 20 seconds. You can use an alcohol-based hand sanitizer if soap and water are not available and if your hands are not visibly dirty. Avoid touching your eyes, nose, and mouth with unwashed hands.   Prevention Steps for Caregivers and Household Members of Individuals Confirmed to have, or  Being Evaluated for, COVID-19 Infection Being Cared for in the Home  If you live with, or provide care at home for, a person confirmed to have, or being evaluated for, COVID-19 infection please follow these guidelines to prevent infection:  Follow healthcare provider's instructions Make sure that you understand and can help the  patient follow any healthcare provider instructions for all care.  Provide for the patient's basic needs You should help the patient with basic needs in the home and provide support for getting groceries, prescriptions, and other personal needs.  Monitor the patient's symptoms If they are getting sicker, call his or her medical provider and tell them that the patient has, or is being evaluated for, COVID-19 infection. This will help the healthcare provider's office take steps to keep other people from getting infected. Ask the healthcare provider to call the local or state health department.  Limit the number of people who have contact with the patient If possible, have only one caregiver for the patient. Other household members should stay in another home or place of residence. If this is not possible, they should stay in another room, or be separated from the patient as much as possible. Use a separate bathroom, if available. Restrict visitors who do not have an essential need to be in the home.  Keep older adults, very young children, and other sick people away from the patient Keep older adults, very young children, and those who have compromised immune systems or chronic health conditions away from the patient. This includes people with chronic heart, lung, or kidney conditions, diabetes, and cancer.  Ensure good ventilation Make sure that shared spaces in the home have good air flow, such as from an air conditioner or an opened window, weather permitting.  Wash your hands often Wash your hands often and thoroughly with soap and water for at least 20 seconds. You can use an alcohol based hand sanitizer if soap and water are not available and if your hands are not visibly dirty. Avoid touching your eyes, nose, and mouth with unwashed hands. Use disposable paper towels to dry your hands. If not available, use dedicated cloth towels and replace them when they become wet.  Wear a  facemask and gloves Wear a disposable facemask at all times in the room and gloves when you touch or have contact with the patient's blood, body fluids, and/or secretions or excretions, such as sweat, saliva, sputum, nasal mucus, vomit, urine, or feces.  Ensure the mask fits over your nose and mouth tightly, and do not touch it during use. Throw out disposable facemasks and gloves after using them. Do not reuse. Wash your hands immediately after removing your facemask and gloves. If your personal clothing becomes contaminated, carefully remove clothing and launder. Wash your hands after handling contaminated clothing. Place all used disposable facemasks, gloves, and other waste in a lined container before disposing them with other household waste. Remove gloves and wash your hands immediately after handling these items.  Do not share dishes, glasses, or other household items with the patient Avoid sharing household items. You should not share dishes, drinking glasses, cups, eating utensils, towels, bedding, or other items with a patient who is confirmed to have, or being evaluated for, COVID-19 infection. After the person uses these items, you should wash them thoroughly with soap and water.  Wash laundry thoroughly Immediately remove and wash clothes or bedding that have blood, body fluids, and/or secretions or excretions, such as sweat, saliva, sputum, nasal mucus,  vomit, urine, or feces, on them. Wear gloves when handling laundry from the patient. Read and follow directions on labels of laundry or clothing items and detergent. In general, wash and dry with the warmest temperatures recommended on the label.  Clean all areas the individual has used often Clean all touchable surfaces, such as counters, tabletops, doorknobs, bathroom fixtures, toilets, phones, keyboards, tablets, and bedside tables, every day. Also, clean any surfaces that may have blood, body fluids, and/or secretions or excretions  on them. Wear gloves when cleaning surfaces the patient has come in contact with. Use a diluted bleach solution (e.g., dilute bleach with 1 part bleach and 10 parts water) or a household disinfectant with a label that says EPA-registered for coronaviruses. To make a bleach solution at home, add 1 tablespoon of bleach to 1 quart (4 cups) of water. For a larger supply, add  cup of bleach to 1 gallon (16 cups) of water. Read labels of cleaning products and follow recommendations provided on product labels. Labels contain instructions for safe and effective use of the cleaning product including precautions you should take when applying the product, such as wearing gloves or eye protection and making sure you have good ventilation during use of the product. Remove gloves and wash hands immediately after cleaning.  Monitor yourself for signs and symptoms of illness Caregivers and household members are considered close contacts, should monitor their health, and will be asked to limit movement outside of the home to the extent possible. Follow the monitoring steps for close contacts listed on the symptom monitoring form.   ? If you have additional questions, contact your local health department or call the epidemiologist on call at [phone] (available 24/7). ? This guidance is subject to change. For the most up-to-date guidance from [facility], please refer to their website: [email or web address]

## 2020-12-23 LAB — POCT INFLUENZA A/B
Influenza A, POC: NEGATIVE
Influenza B, POC: NEGATIVE

## 2020-12-24 LAB — SARS-COV-2, NAA 2 DAY TAT

## 2020-12-24 LAB — NOVEL CORONAVIRUS, NAA: SARS-CoV-2, NAA: NOT DETECTED

## 2020-12-24 NOTE — Telephone Encounter (Signed)
Called pt no answer, left a detailed vm.

## 2020-12-28 ENCOUNTER — Encounter: Payer: Self-pay | Admitting: Family Medicine

## 2020-12-29 MED ORDER — BENZONATATE 200 MG PO CAPS: 200.0000 mg | ORAL_CAPSULE | Freq: Two times a day (BID) | ORAL | 0 refills | Status: DC | PRN

## 2021-01-16 ENCOUNTER — Emergency Department: Payer: Medicare Other

## 2021-01-16 ENCOUNTER — Other Ambulatory Visit: Payer: Self-pay

## 2021-01-16 ENCOUNTER — Emergency Department
Admission: EM | Admit: 2021-01-16 | Discharge: 2021-01-16 | Disposition: A | Discharge status: Home / Self Care | Payer: Medicare Other | Attending: Emergency Medicine | Admitting: Emergency Medicine

## 2021-01-16 ENCOUNTER — Encounter: Payer: Self-pay | Admitting: Emergency Medicine

## 2021-01-16 DIAGNOSIS — S0003XA Contusion of scalp, initial encounter: Secondary | ICD-10-CM | POA: Diagnosis not present

## 2021-01-16 DIAGNOSIS — S0990XA Unspecified injury of head, initial encounter: Secondary | ICD-10-CM | POA: Diagnosis not present

## 2021-01-16 DIAGNOSIS — S50319A Abrasion of unspecified elbow, initial encounter: Secondary | ICD-10-CM | POA: Insufficient documentation

## 2021-01-16 DIAGNOSIS — M2578 Osteophyte, vertebrae: Secondary | ICD-10-CM | POA: Diagnosis not present

## 2021-01-16 DIAGNOSIS — I10 Essential (primary) hypertension: Secondary | ICD-10-CM | POA: Insufficient documentation

## 2021-01-16 DIAGNOSIS — W01198A Fall on same level from slipping, tripping and stumbling with subsequent striking against other object, initial encounter: Secondary | ICD-10-CM | POA: Insufficient documentation

## 2021-01-16 DIAGNOSIS — S20211A Contusion of right front wall of thorax, initial encounter: Secondary | ICD-10-CM | POA: Insufficient documentation

## 2021-01-16 DIAGNOSIS — Z79899 Other long term (current) drug therapy: Secondary | ICD-10-CM | POA: Insufficient documentation

## 2021-01-16 DIAGNOSIS — Z7984 Long term (current) use of oral hypoglycemic drugs: Secondary | ICD-10-CM | POA: Diagnosis not present

## 2021-01-16 DIAGNOSIS — R0781 Pleurodynia: Secondary | ICD-10-CM | POA: Diagnosis not present

## 2021-01-16 DIAGNOSIS — E119 Type 2 diabetes mellitus without complications: Secondary | ICD-10-CM | POA: Diagnosis not present

## 2021-01-16 DIAGNOSIS — M47812 Spondylosis without myelopathy or radiculopathy, cervical region: Secondary | ICD-10-CM | POA: Diagnosis not present

## 2021-01-16 NOTE — ED Triage Notes (Signed)
Pt in via POV, reports mechanical fall, hitting posterior head on asphalt.  Denies LOC, denies blood thinners.  Complaints of pain to head and right ribcage.  Ambulatory to triage, A/Ox4, vitals WDL.

## 2021-01-16 NOTE — ED Provider Notes (Signed)
[facility] Emergency Department Provider Note   ____________________________________________    I have reviewed the triage vital signs and the nursing notes.   HISTORY  Chief Complaint Fall     HPI Sandy Salinas is a 70 y.o. female who presents after a fall.  Patient reports she tripped over a mat, fell struck her right head and injured her right chest.  Reports mild abrasion to the elbow for which she put on a Band-Aid.  No LOC.  Not on blood thinners.  No nausea vomiting or abdominal pain.  No lower extremity injuries reported  Past Medical History:  Diagnosis Date   Abdominal aortic ectasia (HCC) 05/19/2016   [place] April 2018, rescan in five years (April 2023)   Breast calcification seen on mammogram 12/01/2016   LEFT   Cataract    Diabetes mellitus without complication (HCC)    Family history of abdominal aortic aneurysm (AAA) 05/12/2016   father   Hyperlipidemia    Hypertension    Obesity    Osteoporosis    Primary osteoarthritis of left knee    Psoriasis, unspecified    Seizures (HCC)     Patient Active Problem List   Diagnosis Date Noted   Contusion of right chest wall 11/28/2019   Alzheimer's dementia without behavioral disturbance (HCC) 11/17/2019   Family history of Alzheimer's disease 11/17/2019   Urge incontinence 11/17/2019   Urinary frequency 11/17/2019   Spells of decreased attentiveness 10/28/2019   Trochanteric bursitis of left hip 05/20/2019   Gait abnormality 03/25/2019   Vertigo 03/25/2019   Microalbuminuria due to type 2 diabetes mellitus (HCC) 02/20/2018   Obesity (BMI 30.0-34.9) 02/19/2017   Breast calcification seen on mammogram 12/01/2016   Age-related osteoporosis without current pathological fracture 11/08/2016   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   Abdominal aortic ectasia (HCC) 05/19/2016   Abnormal EKG 05/12/2016   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 NEGATIVE FOR ATYPIA AND MALIGNANCY   CYSTECTOMY  2003   ROUX-EN-Y GASTRIC BYPASS  2009   TONSILLECTOMY  1970   TUBAL LIGATION  1975    Prior to Admission medications   Medication Sig Start Date End Date Taking? Authorizing Provider  atorvastatin (LIPITOR) 80 MG tablet TAKE 1 TABLET (80 MG) BY MOUTH AT BEDTIME 11/04/20   Danelle Berry, PA-C  benzonatate (TESSALON) 200 MG capsule Take 1 capsule (200 mg total) by mouth 2 (two) times daily as needed for cough. 12/29/20   Caro Laroche, DO  Calcium Carbonate-Vitamin D (CALTRATE 600+D PO) Take by mouth daily.    [provider]  chlorthalidone (HYGROTON) 25 MG tablet Take 1 tablet (25 mg total) by mouth daily. Patient taking differently: Take 25 mg by mouth daily. Pt taking QHS 08/20/20   Danelle Berry, PA-C  Cholecalciferol (VITAMIN D) 2000 units tablet Pt taking daily QHS 02/19/17   Lada,  Janit Bern, MD  clopidogrel (PLAVIX) 75 MG tablet Take 75 mg by mouth daily. Pt taking QHS 04/19/20   [provider]  DULoxetine (CYMBALTA) 30 MG capsule Take 30 mg by mouth at bedtime. 09/22/19 09/21/20  [provider]  gabapentin (NEURONTIN) 100 MG capsule Take 100 mg by mouth at bedtime. 12/11/17   [provider]  lisinopril (ZESTRIL) 10 MG tablet Take 1 tablet (10 mg total) by mouth daily. Patient taking differently: Take 10 mg by mouth daily. Pt taking  QHS 08/11/20   Danelle Berry, PA-C  Magnesium 100 MG TABS Take by mouth.    [provider]  metFORMIN (GLUCOPHAGE) 1000 MG tablet Take 0.5 tablets (500 mg total) by mouth 2 (two) times daily with a meal. 08/11/20   Danelle Berry, PA-C  mirabegron ER (MYRBETRIQ) 25 MG TB24 tablet Take 1 tablet (25 mg total) by mouth daily. 11/04/20   Danelle Berry, PA-C  Multiple Vitamin (MULTIVITAMIN) tablet Take 1 tablet by mouth daily. Pt taking QHS    [provider]  Potassium 99 MG TABS Take by mouth.    [provider]  TURMERIC PO Take 1 capsule by mouth at bedtime.    [provider]  vitamin B-12 (CYANOCOBALAMIN) 100 MCG tablet Take by mouth daily. Pt taking 1000 mcg daily QHS    [provider]  vitamin E 100 UNIT capsule Take by mouth daily. Pt taking QHS    [provider]     Allergies Codeine  Family History  Problem Relation Age of Onset   Alzheimer's disease Mother    Heart murmur Mother    Dementia Mother    Arthritis Mother    AAA (abdominal aortic aneurysm) Father    Heart disease Father    Asthma Sister    Hypertension Sister    Cancer Sister        breast   Breast cancer Sister        late 70's   Heart disease Brother    Hypertension Brother    Aneurysm Paternal Aunt    Lung cancer Paternal Uncle    Cancer Paternal Grandmother        possible, not sure what kind   Cancer Cousin     Social History Social History   Tobacco Use   Smoking status: Never   Smokeless tobacco: Never  Vaping Use   Vaping Use: Never used  Substance Use Topics   Alcohol use: No   Drug use: No    Review of Systems  Constitutional: No dizziness  ENT: No facial injury   Gastrointestinal: No abdominal pain.  No nausea, no vomiting.    Musculoskeletal: As above Skin: Laceration to the Neurological: No neurodeficit    ____________________________________________   PHYSICAL EXAM:  VITAL SIGNS: ED Triage Vitals  Enc Vitals Group      BP 01/16/21 1518 (!) 154/73     Pulse Rate 01/16/21 1518 75     Resp 01/16/21 1518 17     Temp 01/16/21 1518 98.2 F (36.8 C)     Temp Source 01/16/21 1518 Oral     SpO2 01/16/21 1518 97 %     Weight 01/16/21 1519 77.1 kg (170 lb)     Height 01/16/21 1519 1.626 m (5\' 4" )     Head Circumference --      Peak Flow --      Pain Score 01/16/21 1519 9     Pain Loc --  Pain Edu? --      Excl. in GC? --      Constitutional: Alert and oriented. No acute distress. Pleasant and interactive Eyes: Conjunctivae are normal.  Head: Hematoma to the right posterior scalp, bleeding controlled, no indication for repair Nose: No congestion/rhinnorhea. Mouth/Throat: Mucous membranes are moist.   Cardiovascular: Normal rate, regular rhythm.  Mild tenderness palpation right lower chest wall, no palpable bony abnormalities Respiratory: Normal respiratory effort.  No retractions.  Musculoskeletal: Normal range of motion of all extremities, no pain with axial load on both hips.  No vertebral tenderness palpation Neurologic:  Normal speech and language. No gross focal neurologic deficits are appreciated.   Skin:  Skin is warm, dry    ____________________________________________   LABS (all labs ordered are listed, but only abnormal results are displayed)  Labs Reviewed - No data to display ____________________________________________  EKG   ____________________________________________  RADIOLOGY  CT head reviewed by me, no acute abnormalities ____________________________________________   PROCEDURES  Procedure(s) performed: No  Procedures   Critical Care performed: No ____________________________________________   INITIAL IMPRESSION / ASSESSMENT AND PLAN / ED COURSE  Pertinent labs & imaging results that were available during my care of the patient were reviewed by me and considered in my medical decision making (see chart for details).   Patient presents after fall as  described above.  CT head cervical spine chest x-ray are reassuring.  Exam is otherwise unremarkable.  Suspect chest wall contusion, scalp hematoma, supportive care, outpatient follow-up.   ____________________________________________   FINAL CLINICAL IMPRESSION(S) / ED DIAGNOSES  Final diagnoses:  Injury of head, initial encounter  Chest wall contusion, right, initial encounter      NEW MEDICATIONS STARTED DURING THIS VISIT:  Discharge Medication List as of 01/16/2021  4:43 PM       Note:  This document was prepared using Dragon voice recognition software and may include unintentional dictation errors.    Jene Every, MD 01/16/21 [phone]

## 2021-01-16 NOTE — ED Provider Notes (Signed)
[facility] Provider Triage Evaluation Note  Sandy Salinas , a 70 y.o.female,  was evaluated in triage.  Pt complains of injuries from mechanical fall.  Patient states that she slipped on a rug and fell backwards onto asphalt.  Denies loss of consciousness, vomiting, or symptoms preceding the event.  Denies blood thinners.  Currently complaining of pain on the posterior aspect of her head as well as right side rib cage.    Review of Systems  Positive: Headache, rib pain Negative: Denies fever, chest pain, vomiting  Physical Exam   Vitals:   01/16/21 1518  BP: (!) 154/73  Pulse: 75  Resp: 17  Temp: 98.2 F (36.8 C)  SpO2: 97%   Gen:   Awake, no distress   Resp:  Normal effort  MSK:   Moves extremities without difficulty  Other:    Medical Decision Making  Given the patient's initial medical screening exam, the following diagnostic evaluation has been ordered. The patient will be placed in the appropriate treatment space, once one is available, to complete the evaluation and treatment. I have discussed the plan of care with the patient and I have advised the patient that an ED physician or mid-level practitioner will reevaluate their condition after the test results have been received, as the results may give them additional insight into the type of treatment they may need.    Diagnostics: Head CT, cervical spine CT, chest x-ray  Treatments: none immediately   Varney Daily, PA 01/16/21 1528    Merwyn Katos, MD 01/16/21 1626

## 2021-01-25 ENCOUNTER — Ambulatory Visit (INDEPENDENT_AMBULATORY_CARE_PROVIDER_SITE_OTHER): Payer: Medicare Other | Admitting: Internal Medicine

## 2021-01-25 ENCOUNTER — Encounter: Payer: Self-pay | Admitting: Internal Medicine

## 2021-01-25 VITALS — BP 134/68 | HR 83 | Temp 97.8°F | Resp 16 | Ht 64.0 in | Wt 174.3 lb

## 2021-01-25 DIAGNOSIS — E669 Obesity, unspecified: Secondary | ICD-10-CM

## 2021-01-25 DIAGNOSIS — S0003XD Contusion of scalp, subsequent encounter: Secondary | ICD-10-CM

## 2021-01-25 DIAGNOSIS — E1169 Type 2 diabetes mellitus with other specified complication: Secondary | ICD-10-CM

## 2021-01-25 DIAGNOSIS — W19XXXD Unspecified fall, subsequent encounter: Secondary | ICD-10-CM | POA: Diagnosis not present

## 2021-01-25 LAB — POCT GLYCOSYLATED HEMOGLOBIN (HGB A1C): Hemoglobin A1C: 8.2 % — AB (ref 4.0–5.6)

## 2021-01-25 MED ORDER — TRULICITY 0.75 MG/0.5ML ~~LOC~~ SOAJ
0.7500 mg | SUBCUTANEOUS | 3 refills | Status: DC
Start: 2021-01-25 — End: 2021-02-24

## 2021-01-25 NOTE — Progress Notes (Signed)
Established Patient Office Visit  Subjective:  Patient ID: Sandy Salinas, female    DOB: [birth year]  Age: 70 y.o. MRN: [id number]  CC:  Chief Complaint  Patient presents with   Follow-up   Diabetes    HPI [name] presents for follow up on diabetes. Recently went to ER on 01/16/21 after a mechanical fall when her flip-flop got stuck under a mat and she tripped. She's not sure how she fell but she hit the back of her head on the driveway and hit her right flank and hip as well. She is on Plavix. CT of the head showed a prominent right parietal hematoma but no intracranial bleeding. Chest x-ray normal, no broken ribs. Ct neck negative for fracture. Since then she has been doing better but has a large scab on the back of her scalp and will wake up with blood on her pillow.   Diabetes, Type 2: -Last A1c 10/21/20 8.3%, today 8.2% -Medications: Metformin 500 BID -Patient is compliant with the above medications and reports no side effects.  -Checking BG at home: No not checking, can tell when it gets low  -Lowest home BG since last visit: no hypoglycemic events since last visit  -Diet: stays away from bread and most carbs -Exercise: No regular exercise but active, does yard work -Eye exam: November 2022 -Foot exam: UTD -Microalbumin: 10/21/20 normal -Statin: Lipitor 80 mg  -PNA vaccine: Prevnar 13 in 2018, Prevnar 23 in 2019 -Denies symptoms of hypoglycemia, polyuria, polydipsia, numbness extremities, foot ulcers/trauma.   Past Medical History:  Diagnosis Date   Abdominal aortic ectasia ([place]) 05/19/2016   [place] April 2018, rescan in five years (April 2023)   Breast calcification seen on mammogram 12/01/2016   LEFT   Cataract    Diabetes mellitus without complication ([place])    Family history of abdominal aortic aneurysm (AAA) 05/12/2016   father   Hyperlipidemia    Hypertension    Obesity    Osteoporosis    Primary osteoarthritis of left knee    Psoriasis, unspecified     Seizures ([place])     Past Surgical History:  Procedure Laterality Date   ABDOMINAL HYSTERECTOMY  1975   total   BREAST BIOPSY Right 04/09/2012   x 2, FIBROADENOMA WITH COARSE CALCIFICATIONS.done by byrnett   BREAST BIOPSY Left 09/04/2017   Affirm Bx- X clip, FIBROADENOMATOUS CHANGE WITH CALCIFICATIONS. NEGATIVE FOR ATYPIA AND MALIGNANCY   CYSTECTOMY  2003   ROUX-EN-Y GASTRIC BYPASS  2009   TONSILLECTOMY  1970   TUBAL LIGATION  1975    Family History  Problem Relation Age of Onset   Alzheimer's disease Mother    Heart murmur Mother    Dementia Mother    Arthritis Mother    AAA (abdominal aortic aneurysm) Father    Heart disease Father    Asthma Sister    Hypertension Sister    Cancer Sister        breast   Breast cancer Sister        late 62's   Heart disease Brother    Hypertension Brother    Aneurysm Paternal Aunt    Lung cancer Paternal Uncle    Cancer Paternal Grandmother        possible, not sure what kind   Cancer Cousin     Social History   Socioeconomic History   Marital status: Married    Spouse name: Elta Guadeloupe   Number of children: 2   Years of education: Not  on file   Highest education level: Some college, no degree  Occupational History   Occupation: retired  Tobacco Use   Smoking status: Never   Smokeless tobacco: Never  Vaping Use   Vaping Use: Never used  Substance and Sexual Activity   Alcohol use: No   Drug use: No   Sexual activity: Not Currently    Birth control/protection: Abstinence    Comment: Old Age  Other Topics Concern   Not on file  Social History Narrative   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    Social Determinants of Health   Financial Resource Strain: Low Risk    Difficulty of Paying Living Expenses: Not very hard  Food Insecurity: No Food Insecurity   Worried About Charity fundraiser in the Last Year: Never true   Ran Out of Food in the Last Year: Never true  Transportation Needs: No Transportation Needs    Lack of Transportation (Medical): No   Lack of Transportation (Non-Medical): No  Physical Activity: Inactive   Days of Exercise per Week: 0 days   Minutes of Exercise per Session: 0 min  Stress: No Stress Concern Present   Feeling of Stress : Not at all  Social Connections: Moderately Isolated   Frequency of Communication with Friends and Family: More than three times a week   Frequency of Social Gatherings with Friends and Family: More than three times a week   Attends Religious Services: Never   Marine scientist or Organizations: No   Attends Music therapist: Never   Marital Status: Married  Human resources officer Violence: Not At Risk   Fear of Current or Ex-Partner: No   Emotionally Abused: No   Physically Abused: No   Sexually Abused: No    Outpatient Medications Prior to Visit  Medication Sig Dispense Refill   atorvastatin (LIPITOR) 80 MG tablet TAKE 1 TABLET (80 MG) BY MOUTH AT BEDTIME 90 tablet 3   benzonatate (TESSALON) 200 MG capsule Take 1 capsule (200 mg total) by mouth 2 (two) times daily as needed for cough. 20 capsule 0   Calcium Carbonate-Vitamin D (CALTRATE 600+D PO) Take by mouth daily.     chlorthalidone (HYGROTON) 25 MG tablet Take 1 tablet (25 mg total) by mouth daily. (Patient taking differently: Take 25 mg by mouth daily. Pt taking QHS) 90 tablet 3   Cholecalciferol (VITAMIN D) 2000 units tablet Pt taking daily QHS     clopidogrel (PLAVIX) 75 MG tablet Take 75 mg by mouth daily. Pt taking QHS     DULoxetine (CYMBALTA) 30 MG capsule Take 30 mg by mouth at bedtime.     gabapentin (NEURONTIN) 100 MG capsule Take 100 mg by mouth at bedtime.     lisinopril (ZESTRIL) 10 MG tablet Take 1 tablet (10 mg total) by mouth daily. (Patient taking differently: Take 10 mg by mouth daily. Pt taking QHS) 90 tablet 3   Magnesium 100 MG TABS Take by mouth.     metFORMIN (GLUCOPHAGE) 1000 MG tablet Take 0.5 tablets (500 mg total) by mouth 2 (two) times daily with a  meal. 180 tablet 2   mirabegron ER (MYRBETRIQ) 25 MG TB24 tablet Take 1 tablet (25 mg total) by mouth daily. 30 tablet 11   Multiple Vitamin (MULTIVITAMIN) tablet Take 1 tablet by mouth daily. Pt taking QHS     Potassium 99 MG TABS Take by mouth.     TURMERIC PO Take 1 capsule by mouth at bedtime.  vitamin B-12 (CYANOCOBALAMIN) 100 MCG tablet Take by mouth daily. Pt taking 1000 mcg daily QHS     vitamin E 100 UNIT capsule Take by mouth daily. Pt taking QHS     No facility-administered medications prior to visit.    Allergies  Allergen Reactions   Codeine Nausea Only and Other (See Comments)    Hallucinations    ROS Review of Systems  Constitutional:  Negative for chills and fever.  Eyes:  Negative for visual disturbance.  Respiratory:  Negative for cough and shortness of breath.   Cardiovascular:  Negative for chest pain.  Gastrointestinal:  Negative for abdominal pain and diarrhea.  Musculoskeletal:  Positive for neck pain. Negative for neck stiffness.  Neurological:  Negative for dizziness and headaches.  Hematological:  Bruises/bleeds easily.     Objective:    Physical Exam Constitutional:      Appearance: Normal appearance.  HENT:     Head: Normocephalic and atraumatic.     Comments: Large soft hematoma on right parietal scalp with a large scab overlying, no active bleeding. Large hematoma on right side of neck as well.  Eyes:     Conjunctiva/sclera: Conjunctivae normal.  Cardiovascular:     Rate and Rhythm: Normal rate and regular rhythm.  Pulmonary:     Effort: Pulmonary effort is normal.     Breath sounds: Normal breath sounds.  Musculoskeletal:     Right lower leg: No edema.     Left lower leg: No edema.  Skin:    General: Skin is warm and dry.  Neurological:     General: No focal deficit present.     Mental Status: She is alert. Mental status is at baseline.  Psychiatric:        Mood and Affect: Mood normal.        Behavior: Behavior normal.    BP  134/68    Pulse 83    Temp 97.8 F (36.6 C) (Oral)    Resp 16    Ht '5\' 4"'  (1.626 m)    Wt 174 lb 4.8 oz (79.1 kg)    SpO2 98%    BMI 29.92 kg/m  Wt Readings from Last 3 Encounters:  01/16/21 170 lb (77.1 kg)  12/14/20 173 lb (78.5 kg)  10/21/20 175 lb 1.6 oz (79.4 kg)     Health Maintenance Due  Topic Date Due   Zoster Vaccines- Shingrix (1 of 2) Never done   OPHTHALMOLOGY EXAM  08/19/2020   COVID-19 Vaccine (6 - Booster for Pfizer series) 12/16/2020    There are no preventive care reminders to display for this patient.  Lab Results  Component Value Date   TSH 1.75 04/27/2020   Lab Results  Component Value Date   WBC 9.3 10/21/2020   HGB 12.8 10/21/2020   HCT 40.3 10/21/2020   MCV 85.0 10/21/2020   PLT 488 (H) 10/21/2020   Lab Results  Component Value Date   NA 134 (L) 10/21/2020   K 3.9 10/21/2020   CO2 30 10/21/2020   GLUCOSE 140 (H) 10/21/2020   BUN 13 10/21/2020   CREATININE 0.70 10/21/2020   BILITOT 0.3 10/21/2020   ALKPHOS 76 06/06/2016   AST 22 10/21/2020   ALT 11 10/21/2020   PROT 6.6 10/21/2020   ALBUMIN 4.1 06/06/2016   CALCIUM 9.4 10/21/2020   EGFR 94 10/21/2020   Lab Results  Component Value Date   CHOL 151 04/27/2020   Lab Results  Component Value Date   HDL 39 (  L) 04/27/2020   Lab Results  Component Value Date   LDLCALC 77 04/27/2020   Lab Results  Component Value Date   TRIG 265 (H) 04/27/2020   Lab Results  Component Value Date   CHOLHDL 3.9 04/27/2020   Lab Results  Component Value Date   HGBA1C 8.3 (H) 10/21/2020      Assessment & Plan:   1. Diabetes mellitus type 2 in obese [facility]): A1c today 8.2%, not much changed. She will continue Metformin 500 mg BID and we will add Trulicity weekly. She states she has been on this in the past and tolerated it well, doesn't remember why this was discontinued. Start at lowest dose, recheck A1c in 3 months.   - POCT HgB A1C - Dulaglutide (TRULICITY) 8.75 ZV/7.2QA SOPN; Inject 0.75 mg  into the skin once a week.  Dispense: 6 mL; Refill: 3  2. Fall, subsequent encounter/Hematoma of right parietal scalp, subsequent encounter: Reviewed ER note, imaging. She does have a large hematoma and overlying scab, is on Plavix. Counseled that this will reabsorb over the next several weeks.   Follow-up: Return in about 3 months (around 04/25/2021).    Teodora Medici, DO

## 2021-01-25 NOTE — Patient Instructions (Addendum)
It was great seeing you today!  Plan discussed at today's visit: -Continue Metformin 500 mg twice a day -New Trulicity injection sent to pharmacy to give once a week -If you have side effects or stop the medication for any reason, please let me know  Follow up in: 3 months   Take care and let us know if you have any questions or concerns prior to your next visit.  Dr. Caralee Ates

## 2021-01-26 ENCOUNTER — Telehealth: Payer: Self-pay

## 2021-01-26 NOTE — Telephone Encounter (Signed)
Copied from CRM [id number]. Topic: General - Inquiry >> Jan 25, 2021  4:54 PM Daphine Deutscher D wrote: Pt called saying the medication Trulicity that was sent to the pharmacy is too expensive.  She is wanting to know if she can do a month at a time.  She said 3 months was 120.00.  Or if Ms. Dareen Piano has any other options please let her know.  CB#  [phone]

## 2021-01-26 NOTE — Telephone Encounter (Signed)
Called pt she states will just do monthly supply, and she can afford

## 2021-02-04 ENCOUNTER — Ambulatory Visit (INDEPENDENT_AMBULATORY_CARE_PROVIDER_SITE_OTHER): Payer: Medicare Other | Admitting: Internal Medicine

## 2021-02-04 ENCOUNTER — Ambulatory Visit
Admission: RE | Admit: 2021-02-04 | Discharge: 2021-02-04 | Disposition: A | Discharge status: Home / Self Care | Payer: Medicare Other | Source: Ambulatory Visit | Attending: Internal Medicine | Admitting: Internal Medicine

## 2021-02-04 ENCOUNTER — Ambulatory Visit
Admission: RE | Admit: 2021-02-04 | Discharge: 2021-02-04 | Disposition: A | Discharge status: Home / Self Care | Payer: Medicare Other | Attending: Internal Medicine | Admitting: Internal Medicine

## 2021-02-04 ENCOUNTER — Encounter: Payer: Self-pay | Admitting: Internal Medicine

## 2021-02-04 ENCOUNTER — Telehealth: Payer: Self-pay | Admitting: Family Medicine

## 2021-02-04 VITALS — BP 126/68 | HR 86 | Temp 98.1°F | Resp 16 | Ht 64.0 in | Wt 170.4 lb

## 2021-02-04 DIAGNOSIS — E669 Obesity, unspecified: Secondary | ICD-10-CM | POA: Diagnosis not present

## 2021-02-04 DIAGNOSIS — R1032 Left lower quadrant pain: Secondary | ICD-10-CM | POA: Insufficient documentation

## 2021-02-04 DIAGNOSIS — S79912S Unspecified injury of left hip, sequela: Secondary | ICD-10-CM

## 2021-02-04 DIAGNOSIS — M25552 Pain in left hip: Secondary | ICD-10-CM | POA: Insufficient documentation

## 2021-02-04 DIAGNOSIS — E1169 Type 2 diabetes mellitus with other specified complication: Secondary | ICD-10-CM

## 2021-02-04 DIAGNOSIS — S79912A Unspecified injury of left hip, initial encounter: Secondary | ICD-10-CM | POA: Diagnosis not present

## 2021-02-04 DIAGNOSIS — I878 Other specified disorders of veins: Secondary | ICD-10-CM | POA: Diagnosis not present

## 2021-02-04 DIAGNOSIS — M1612 Unilateral primary osteoarthritis, left hip: Secondary | ICD-10-CM | POA: Diagnosis not present

## 2021-02-04 MED ORDER — EMPAGLIFLOZIN 10 MG PO TABS
10.0000 mg | ORAL_TABLET | Freq: Every day | ORAL | 3 refills | Status: DC
Start: 2021-02-04 — End: 2021-04-26

## 2021-02-04 NOTE — Telephone Encounter (Signed)
Pt calling in bc [facility] only had one pen of Dulaglutide (TRULICITY)  and won't have the rest until after the new year, she's trying to see if she can be prescribed something else bc she has a $400 copay at the beginning of the year

## 2021-02-04 NOTE — Patient Instructions (Addendum)
It was great seeing you today!  Plan discussed at today's visit: -Left hip x-ray ordered  -For muscular pain, try Voltaren gel to help reduce inflammation, gentle, stretching moist heat and Tylenol for pain.   Follow up in: as needed.   Take care and let us know if you have any questions or concerns prior to your next visit.  Dr. Caralee Ates

## 2021-02-04 NOTE — Addendum Note (Signed)
Addended by: Margarita Mail on: 02/04/2021 03:35 PM   Modules accepted: Orders

## 2021-02-04 NOTE — Telephone Encounter (Signed)
Pt.notified

## 2021-02-04 NOTE — Progress Notes (Signed)
Acute Office Visit  Subjective:    Patient ID: Sandy Salinas, female    DOB: Apr 30, 1950, 70 y.o.   MRN: [id number]  Chief Complaint  Patient presents with   Fall    On 12/11 having worse pain in right leg and groin area    HPI Patient is in today for fall 3 weeks ago. Went to ER on 01/16/21 after a mechanical fall when her flip-flop got stuck under a mat and she tripped. She's not sure how she fell but she hit the back of her head on the driveway and hit her right flank and hip as well. She is on Plavix. CT of the head showed a prominent right parietal hematoma but no intracranial bleeding. Chest x-ray normal, no broken ribs. CT neck negative for fracture. She had been having mild left leg/groin pain after the fall but this got worse over the last week or so,   LEG PAIN Duration: 3 weeks Pain: yes Severity: severe at the first part of the week  Quality:  sharp and pulling Location:   left groin pulling type pain, now a burning type pain down her anterior left thigh Bilateral:  no Onset: sudden, after fall 3 weeks ago, no other trauma Agrrevating: worst with standing, walking  Alleviatating: Rest, took one aspirin that did help  Some instability, no locking, pain is constant   Past Medical History:  Diagnosis Date   Abdominal aortic ectasia ([place]) 05/19/2016   [place] April 2018, rescan in five years (April 2023)   Breast calcification seen on mammogram 12/01/2016   LEFT   Cataract    Diabetes mellitus without complication ([place])    Family history of abdominal aortic aneurysm (AAA) 05/12/2016   father   Hyperlipidemia    Hypertension    Obesity    Osteoporosis    Primary osteoarthritis of left knee    Psoriasis, unspecified    Seizures ([place])     Past Surgical History:  Procedure Laterality Date   ABDOMINAL HYSTERECTOMY  1975   total   BREAST BIOPSY Right 04/09/2012   x 2, FIBROADENOMA WITH COARSE CALCIFICATIONS.done by byrnett   BREAST BIOPSY Left 09/04/2017    Affirm Bx- X clip, FIBROADENOMATOUS CHANGE WITH CALCIFICATIONS. NEGATIVE FOR ATYPIA AND MALIGNANCY   CYSTECTOMY  2003   ROUX-EN-Y GASTRIC BYPASS  2009   TONSILLECTOMY  1970   TUBAL LIGATION  1975    Family History  Problem Relation Age of Onset   Alzheimer's disease Mother    Heart murmur Mother    Dementia Mother    Arthritis Mother    AAA (abdominal aortic aneurysm) Father    Heart disease Father    Asthma Sister    Hypertension Sister    Cancer Sister        breast   Breast cancer Sister        late 55's   Heart disease Brother    Hypertension Brother    Aneurysm Paternal Aunt    Lung cancer Paternal Uncle    Cancer Paternal Grandmother        possible, not sure what kind   Cancer Cousin     Social History   Socioeconomic History   Marital status: Married    Spouse name: Elta Guadeloupe   Number of children: 2   Years of education: Not on file   Highest education level: Some college, no degree  Occupational History   Occupation: retired  Tobacco Use   Smoking status: Never  Smokeless tobacco: Never  Vaping Use   Vaping Use: Never used  Substance and Sexual Activity   Alcohol use: No   Drug use: No   Sexual activity: Not Currently    Birth control/protection: Abstinence    Comment: Old Age  Other Topics Concern   Not on file  Social History Narrative   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    Social Determinants of Health   Financial Resource Strain: Low Risk    Difficulty of Paying Living Expenses: Not very hard  Food Insecurity: No Food Insecurity   Worried About Charity fundraiser in the Last Year: Never true   Ran Out of Food in the Last Year: Never true  Transportation Needs: No Transportation Needs   Lack of Transportation (Medical): No   Lack of Transportation (Non-Medical): No  Physical Activity: Inactive   Days of Exercise per Week: 0 days   Minutes of Exercise per Session: 0 min  Stress: No Stress Concern Present   Feeling of Stress  : Not at all  Social Connections: Moderately Isolated   Frequency of Communication with Friends and Family: More than three times a week   Frequency of Social Gatherings with Friends and Family: More than three times a week   Attends Religious Services: Never   Marine scientist or Organizations: No   Attends Music therapist: Never   Marital Status: Married  Human resources officer Violence: Not At Risk   Fear of Current or Ex-Partner: No   Emotionally Abused: No   Physically Abused: No   Sexually Abused: No    Outpatient Medications Prior to Visit  Medication Sig Dispense Refill   atorvastatin (LIPITOR) 80 MG tablet TAKE 1 TABLET (80 MG) BY MOUTH AT BEDTIME 90 tablet 3   benzonatate (TESSALON) 200 MG capsule Take 1 capsule (200 mg total) by mouth 2 (two) times daily as needed for cough. 20 capsule 0   Calcium Carbonate-Vitamin D (CALTRATE 600+D PO) Take by mouth daily.     chlorthalidone (HYGROTON) 25 MG tablet Take 1 tablet (25 mg total) by mouth daily. (Patient taking differently: Take 25 mg by mouth daily. Pt taking QHS) 90 tablet 3   Cholecalciferol (VITAMIN D) 2000 units tablet Pt taking daily QHS     clopidogrel (PLAVIX) 75 MG tablet Take 75 mg by mouth daily. Pt taking QHS     Dulaglutide (TRULICITY) 9.83 JA/2.5KN SOPN Inject 0.75 mg into the skin once a week. 6 mL 3   DULoxetine (CYMBALTA) 30 MG capsule Take 30 mg by mouth at bedtime.     gabapentin (NEURONTIN) 100 MG capsule Take 100 mg by mouth at bedtime.     lisinopril (ZESTRIL) 10 MG tablet Take 1 tablet (10 mg total) by mouth daily. (Patient taking differently: Take 10 mg by mouth daily. Pt taking QHS) 90 tablet 3   Magnesium 100 MG TABS Take by mouth.     metFORMIN (GLUCOPHAGE) 1000 MG tablet Take 0.5 tablets (500 mg total) by mouth 2 (two) times daily with a meal. 180 tablet 2   mirabegron ER (MYRBETRIQ) 25 MG TB24 tablet Take 1 tablet (25 mg total) by mouth daily. 30 tablet 11   Multiple Vitamin  (MULTIVITAMIN) tablet Take 1 tablet by mouth daily. Pt taking QHS     Potassium 99 MG TABS Take by mouth.     TURMERIC PO Take 1 capsule by mouth at bedtime.     vitamin B-12 (CYANOCOBALAMIN) 100 MCG  tablet Take by mouth daily. Pt taking 1000 mcg daily QHS     vitamin E 100 UNIT capsule Take by mouth daily. Pt taking QHS     No facility-administered medications prior to visit.    Allergies  Allergen Reactions   Codeine Nausea Only and Other (See Comments)    Hallucinations    Review of Systems  Constitutional:  Negative for chills and fever.  Eyes:  Negative for visual disturbance.  Cardiovascular:  Negative for chest pain.  Musculoskeletal:  Positive for gait problem and myalgias. Negative for back pain and joint swelling.  Skin: Negative.       Objective:    Physical Exam Constitutional:      Appearance: Normal appearance.  HENT:     Head: Normocephalic and atraumatic.  Eyes:     Conjunctiva/sclera: Conjunctivae normal.  Cardiovascular:     Rate and Rhythm: Normal rate and regular rhythm.  Pulmonary:     Effort: Pulmonary effort is normal.     Breath sounds: Normal breath sounds.  Musculoskeletal:        General: Tenderness present. No swelling. Normal range of motion.     Right lower leg: No edema.     Left lower leg: No edema.     Comments: 5/5 BLE strength, pain in left hip with external rotation and flexion. Pain to palpation only over groin.  Skin:    General: Skin is warm and dry.  Neurological:     General: No focal deficit present.     Mental Status: She is alert. Mental status is at baseline.  Psychiatric:        Mood and Affect: Mood normal.        Behavior: Behavior normal.    BP 126/68    Pulse 86    Temp 98.1 F (36.7 C)    Resp 16    Ht _0  (1.626 m)    Wt 170 lb 6.4 oz (77.3 kg)    SpO2 98%    BMI 29.25 kg/m  Wt Readings from Last 3 Encounters:  01/25/21 174 lb 4.8 oz (79.1 kg)  01/16/21 170 lb (77.1 kg)  12/14/20 173 lb (78.5 kg)     Health Maintenance Due  Topic Date Due   Zoster Vaccines- Shingrix (1 of 2) Never done   OPHTHALMOLOGY EXAM  08/19/2020   COVID-19 Vaccine (6 - Booster for Pfizer series) 12/16/2020    There are no preventive care reminders to display for this patient.   Lab Results  Component Value Date   TSH 1.75 04/27/2020   Lab Results  Component Value Date   WBC 9.3 10/21/2020   HGB 12.8 10/21/2020   HCT 40.3 10/21/2020   MCV 85.0 10/21/2020   PLT 488 (H) 10/21/2020   Lab Results  Component Value Date   NA 134 (L) 10/21/2020   K 3.9 10/21/2020   CO2 30 10/21/2020   GLUCOSE 140 (H) 10/21/2020   BUN 13 10/21/2020   CREATININE 0.70 10/21/2020   BILITOT 0.3 10/21/2020   ALKPHOS 76 06/06/2016   AST 22 10/21/2020   ALT 11 10/21/2020   PROT 6.6 10/21/2020   ALBUMIN 4.1 06/06/2016   CALCIUM 9.4 10/21/2020   EGFR 94 10/21/2020   Lab Results  Component Value Date   CHOL 151 04/27/2020   Lab Results  Component Value Date   HDL 39 (L) 04/27/2020   Lab Results  Component Value Date   LDLCALC 77 04/27/2020   Lab Results  Component Value Date   TRIG 265 (H) 04/27/2020   Lab Results  Component Value Date   CHOLHDL 3.9 04/27/2020   Lab Results  Component Value Date   HGBA1C 8.2 (A) 01/25/2021       Assessment & Plan:   1. Groin pain, left/Hip pain, acute, left/Injury of left hip, sequela: An x-ray of the left hip and pelvis will be ordered to rule out fracture, however symptoms most likely due to a pulled muscle in the groin. Treat conservatively with rest, heat, gentle stretching and Tylenol as she cannot have anti-inflammatories due to being on Plavix. Follow up as needed.   - DG HIP UNILAT W OR W/O PELVIS 2-3 VIEWS LEFT; Future   Teodora Medici, DO

## 2021-02-23 ENCOUNTER — Ambulatory Visit: Payer: Self-pay | Admitting: *Deleted

## 2021-02-23 NOTE — Telephone Encounter (Signed)
°  Chief Complaint: feels terrible , feels like "ketoacidosis". Is it jardiance ?  Symptoms: fatigue, mouth dry, increased thirst, frequent urination, blood glucose now 95.  Frequency: na  Pertinent Negatives: Patient denies dizziness, no shortness of breath or difficulty breathing, no fever Disposition: [] ED /[] Urgent Care (no appt availability in office) / [] Appointment(In office/virtual)/ []  [facility]/ [x] Home Care/ [] Refused Recommended Disposition /[] [facility]/ []  Follow-up with PCP Additional Notes:  Instructed patient to recheck blood glucose prior to bedtime and in am .  Call back if symptoms worsen or blood glucose < 70 or > than 250. Patient reports her husband had issues with jardiance and requesting if she should continue taking . Please advise .     Reason for Disposition  Blood glucose 70-240 mg/dL (3.9 - mmol/L)  Answer Assessment - Initial Assessment Questions 1. BLOOD GLUCOSE: "What is your blood glucose level?"      95 2. ONSET: "When did you check the blood glucose?"     Now  3. USUAL RANGE: "What is your glucose level usually?" (e.g., usual fasting morning value, usual evening value)     Does not know  4. KETONES: "Do you check for ketones (urine or blood test strips)?" If yes, ask: "What does the test show now?"      na 5. TYPE 1 or 2:  "Do you know what type of diabetes you have?"  (e.g., Type 1, Type 2, Gestational; doesn't know)      Type 2  6. INSULIN: "Do you take insulin?" "What type of insulin(s) do you use? What is the mode of delivery? (syringe, pen; injection or pump)?"      trulicity 7. DIABETES PILLS: "Do you take any pills for your diabetes?" If yes, ask: "Have you missed taking any pills recently?"     Metformin, jardiance  8. OTHER SYMPTOMS: "Do you have any symptoms?" (e.g., fever, frequent urination, difficulty breathing, dizziness, weakness, vomiting)     Fatigue, increased thirst, frequent urination, dry mouth  9.  PREGNANCY: "Is there any chance you are pregnant?" "When was your last menstrual period?"     na  Protocols used: Diabetes - High Blood Sugar-A-AH

## 2021-02-24 ENCOUNTER — Ambulatory Visit (INDEPENDENT_AMBULATORY_CARE_PROVIDER_SITE_OTHER): Payer: Medicare Other | Admitting: Nurse Practitioner

## 2021-02-24 ENCOUNTER — Encounter: Payer: Self-pay | Admitting: Nurse Practitioner

## 2021-02-24 VITALS — BP 110/66 | HR 73 | Temp 98.0°F | Resp 16 | Ht 64.0 in | Wt 169.2 lb

## 2021-02-24 DIAGNOSIS — E669 Obesity, unspecified: Secondary | ICD-10-CM | POA: Diagnosis not present

## 2021-02-24 DIAGNOSIS — E559 Vitamin D deficiency, unspecified: Secondary | ICD-10-CM

## 2021-02-24 DIAGNOSIS — E1169 Type 2 diabetes mellitus with other specified complication: Secondary | ICD-10-CM | POA: Diagnosis not present

## 2021-02-24 DIAGNOSIS — R6889 Other general symptoms and signs: Secondary | ICD-10-CM | POA: Diagnosis not present

## 2021-02-24 DIAGNOSIS — R5383 Other fatigue: Secondary | ICD-10-CM

## 2021-02-24 LAB — POCT URINALYSIS DIPSTICK
Bilirubin, UA: NEGATIVE
Blood, UA: NEGATIVE
Glucose, UA: POSITIVE — AB
Ketones, UA: NEGATIVE
Leukocytes, UA: NEGATIVE
Nitrite, UA: NEGATIVE
Odor: NORMAL
Protein, UA: NEGATIVE
Spec Grav, UA: 1.02 (ref 1.010–1.025)
Urobilinogen, UA: 0.2 E.U./dL
pH, UA: 5 (ref 5.0–8.0)

## 2021-02-24 NOTE — Telephone Encounter (Signed)
Appt scheduled for today with Della Goo

## 2021-02-24 NOTE — Progress Notes (Signed)
BP 110/66    Pulse 73    Temp 98 F (36.7 C) (Oral)    Resp 16    Ht 5\' 4"  (1.626 m)    Wt 169 lb 3.2 oz (76.7 kg)    SpO2 98%    BMI 29.04 kg/m    Subjective:    Patient ID: , female    DOB: January 17, 1951, 71 y.o.   MRN: [id number]  HPI: Sandy Salinas is a 71 y.o. female, here alone  Chief Complaint  Patient presents with   Fatigue   Urinary Frequency   Fatigue:  She says she started having fatigue on Wednesday. Sleeping well at night 7-8 hours a night.  Wakes up in the morning feeling tired.  She does snore, she had sleep study done and was recommended to use a cpap machine but she did not tolerate. No rectal bleeding.  She denies any chest pain, shortness of breath, nausea, vomiting or cold symptoms. She says that her appetite is good and she drinks plenty of fluids.  Her blood sugar was 117 this am.  Urine dip performed and no sign of infection noted. Will get labs.  Vitamin D deficiency:  She says she has had vitamin D deficiency.  Her last vitamin D was 45 on 11/14/2016.  Will recheck level  Diabetes:  Last A1C was 8.2 on 01/25/2021. She is currently taking Jardiance 10 mg daily and metformin 500 mg BID.  She says that at fist she was concerned about her fatigue because she recently switch from trulicity to [place].  Checked her urine for ketones and it was negative. Reassurance given. She said her blood sugar this morning was 117.   Relevant past medical, surgical, family and social history reviewed and updated as indicated. Interim medical history since our last visit reviewed. Allergies and medications reviewed and updated.  Review of Systems  Constitutional: Negative for fever or weight change. Positive for fatigue Respiratory: Negative for cough and shortness of breath.   Cardiovascular: Negative for chest pain or palpitations.  Gastrointestinal: Negative for abdominal pain, no bowel changes.  Musculoskeletal: Negative for gait problem or joint  swelling.  Skin: Negative for rash.  Neurological: Negative for dizziness or headache.  No other specific complaints in a complete review of systems (except as listed in HPI above).      Objective:    BP 110/66    Pulse 73    Temp 98 F (36.7 C) (Oral)    Resp 16    Ht 5\' 4"  (1.626 m)    Wt 169 lb 3.2 oz (76.7 kg)    SpO2 98%    BMI 29.04 kg/m   Wt Readings from Last 3 Encounters:  02/24/21 169 lb 3.2 oz (76.7 kg)  02/04/21 170 lb 6.4 oz (77.3 kg)  01/25/21 174 lb 4.8 oz (79.1 kg)    Physical Exam  Constitutional: Patient appears well-developed and well-nourished. Overweight No distress.  HEENT: head atraumatic, normocephalic, pupils equal and reactive to light, neck supple Cardiovascular: Normal rate, regular rhythm and normal heart sounds.  No murmur heard. No BLE edema. Pulmonary/Chest: Effort normal and breath sounds normal. No respiratory distress. Abdominal: Soft.  There is no tenderness. Psychiatric: Patient has a normal mood and affect. behavior is normal. Judgment and thought content normal.   Results for orders placed or performed in visit on 02/24/21  POCT Urinalysis Dipstick  Result Value Ref Range   Color, UA Clear    Clarity, UA Clear  Glucose, UA Positive (A) Negative   Bilirubin, UA Negative    Ketones, UA Negative    Spec Grav, UA 1.020 1.010 - 1.025   Blood, UA Negative    pH, UA 5.0 5.0 - 8.0   Protein, UA Negative Negative   Urobilinogen, UA 0.2 0.2 or 1.0 E.U./dL   Nitrite, UA Negative    Leukocytes, UA Negative Negative   Appearance Clear    Odor Normal       Assessment & Plan:   1. Other fatigue - Vitamin B12 - POCT Urinalysis Dipstick - CBC with Differential/Platelet - COMPLETE METABOLIC PANEL WITH GFR - TSH - VITAMIN D 25 Hydroxy (Vit-D Deficiency, Fractures)  2. Vitamin D deficiency  - VITAMIN D 25 Hydroxy (Vit-D Deficiency, Fractures) - Vitamin B12  3. Other general symptoms and signs  - Vitamin B12  4. Diabetes mellitus type  2 in obese [facility])  -continue current treatment  Follow up plan: Return if symptoms worsen or fail to improve.

## 2021-02-25 LAB — COMPLETE METABOLIC PANEL WITH GFR
AG Ratio: 2 (calc) (ref 1.0–2.5)
ALT: 7 U/L (ref 6–29)
AST: 17 U/L (ref 10–35)
Albumin: 4.5 g/dL (ref 3.6–5.1)
Alkaline phosphatase (APISO): 52 U/L (ref 37–153)
BUN: 19 mg/dL (ref 7–25)
CO2: 35 mmol/L — ABNORMAL HIGH (ref 20–32)
Calcium: 9.7 mg/dL (ref 8.6–10.4)
Chloride: 101 mmol/L (ref 98–110)
Creat: 0.8 mg/dL (ref 0.60–1.00)
Globulin: 2.2 g/dL (calc) (ref 1.9–3.7)
Glucose, Bld: 92 mg/dL (ref 65–99)
Potassium: 4.7 mmol/L (ref 3.5–5.3)
Sodium: 139 mmol/L (ref 135–146)
Total Bilirubin: 0.3 mg/dL (ref 0.2–1.2)
Total Protein: 6.7 g/dL (ref 6.1–8.1)
eGFR: 79 mL/min/{1.73_m2} (ref >=60)

## 2021-02-25 LAB — CBC WITH DIFFERENTIAL/PLATELET
Absolute Monocytes: 650 cells/uL (ref 200–950)
Basophils Absolute: 50 cells/uL (ref 0–200)
Basophils Relative: 0.5 %
Eosinophils Absolute: 140 cells/uL (ref 15–500)
Eosinophils Relative: 1.4 %
HCT: 40.2 % (ref 35.0–45.0)
Hemoglobin: 12.8 g/dL (ref 11.7–15.5)
Lymphs Abs: 3370 cells/uL (ref 850–3900)
MCH: 27 pg (ref 27.0–33.0)
MCHC: 31.8 g/dL — ABNORMAL LOW (ref 32.0–36.0)
MCV: 84.8 fL (ref 80.0–100.0)
MPV: 9.6 fL (ref 7.5–12.5)
Monocytes Relative: 6.5 %
Neutro Abs: 5790 cells/uL (ref 1500–7800)
Neutrophils Relative %: 57.9 %
Platelets: 521 10*3/uL — ABNORMAL HIGH (ref 140–400)
RBC: 4.74 10*6/uL (ref 3.80–5.10)
RDW: 14 % (ref 11.0–15.0)
Total Lymphocyte: 33.7 %
WBC: 10 10*3/uL (ref 3.8–10.8)

## 2021-02-25 LAB — VITAMIN D 25 HYDROXY (VIT D DEFICIENCY, FRACTURES): Vit D, 25-Hydroxy: 35 ng/mL (ref 30–100)

## 2021-02-25 LAB — VITAMIN B12: Vitamin B-12: 446 pg/mL (ref 200–1100)

## 2021-02-25 LAB — TSH: TSH: 1.56 mIU/L (ref 0.40–4.50)

## 2021-03-08 ENCOUNTER — Other Ambulatory Visit: Payer: Self-pay

## 2021-03-08 ENCOUNTER — Ambulatory Visit (INDEPENDENT_AMBULATORY_CARE_PROVIDER_SITE_OTHER): Payer: Medicare Other | Admitting: Internal Medicine

## 2021-03-08 ENCOUNTER — Encounter: Payer: Self-pay | Admitting: Internal Medicine

## 2021-03-08 VITALS — BP 122/64 | HR 80 | Resp 16 | Ht 64.0 in | Wt 171.0 lb

## 2021-03-08 DIAGNOSIS — M5431 Sciatica, right side: Secondary | ICD-10-CM

## 2021-03-08 MED ORDER — METHYLPREDNISOLONE 4 MG PO TBPK: ORAL_TABLET | ORAL | 0 refills | Status: DC

## 2021-03-08 NOTE — Patient Instructions (Signed)
It was great seeing you today!  Plan discussed at today's visit: -Can take Tylenol, can continue BenGay and focus on stretching lower back and hamstrings  -Medrol dose pack sent to pharmacy, if this is not helping and your sugars are more uncontrolled, let me know  Follow up in: as needed.   Take care and let us know if you have any questions or concerns prior to your next visit.  Dr. Rosana Berger  Piriformis Syndrome Rehab Ask your health care provider which exercises are safe for you. Do exercises exactly as told by your health care provider and adjust them as directed. It is normal to feel mild stretching, pulling, tightness, or discomfort as you do these exercises. Stop right away if you feel sudden pain or your pain gets worse. Do not begin these exercises until told by your health care provider. Stretching and range-of-motion exercises These exercises warm up your muscles and joints and improve the movement and flexibility of your hip and pelvis. The exercises also help to relieve pain, numbness, and tingling. Nerve root Person sitting on table letting feet swing freely. Person keeps one leg relaxed while raising and straightening other leg.  Sit on a firm surface that is high enough that you can swing your left / right foot freely. Place a folded towel under your left / right thigh. This is optional. Drop your head forward and round your back. While you keep your left / right foot relaxed, slowly straighten your left / right knee until you feel a slight pull behind your knee or calf. If your leg is fully extended and you still do not feel a pull, slowly tilt your foot and toes toward you. Hold this position for __________ seconds. Slowly return your knee to its starting position. Hip rotation Person lying on his back with one knee pulled to chest. Other hand pulls ankle toward shoulder on opposite side of the body.  This is an exercise in which you lie on your back and stretch the  muscles that rotate your hip (hip rotators) to stretch your buttocks. Lie on your back on a firm surface. Pull your left / right knee toward your same shoulder with your left / right hand until your knee is pointing toward the ceiling. Hold your left / right ankle with your other hand. Keeping your knee steady, gently pull your left / right ankle toward your other shoulder until you feel a stretch in your buttocks. Hold this position for __________ seconds. Repeat __________ times. Complete this exercise __________ times a day. Hip extensor Person lying on his back with one leg straight and the other bent. Person grabs knee with both hands and holds it to chest.  This is an exercise in which you lie on your back and pull your knee to your chest. Lie on your back on a firm surface. Both of your legs should be straight. Pull your left / right knee to your chest. Hold your leg in this position by holding on to the back of your thigh or the front of your knee. Hold this position for __________ seconds. Slowly return to the starting position. Repeat __________ times. Complete this exercise __________ times a day. Strengthening exercises These exercises build strength and endurance in your hip and thigh muscles. Endurance is the ability to use your muscles for a long time, even after they get tired. Straight leg raises, side-lying A person lying on her side. The head, shoulder, knee, and hip line up and  the top leg lifts 46 inches (1015 cm).  This exercise strengthens the muscles that rotate the leg at the hip and move it away from your body (hip abductors). Lie on your side with your left / right leg in the top position. Lie so your head, shoulder, knee, and hip line up. Bend your bottom knee to help you balance. Lift your top leg 4-6 inches (10-15 cm) while keeping your toes pointed straight ahead. Hold this position for __________ seconds. Slowly lower your leg to the starting  position. Let your muscles relax completely after each repetition. Repeat __________ times. Complete this exercise __________ times a day. Hip abduction and rotation A person on his hands and knees, raising one leg out to the side while keeping the knee bent.  This is sometimes called quadruped (on hands and knees) exercises. Get on your hands and knees on a firm, lightly padded surface. Your hands should be directly below your shoulders, and your knees should be directly below your hips. Lift your left / right knee out to the side. Keep your knee bent. Do not twist your body. Hold this position for __________ seconds. Slowly lower your leg. Repeat __________ times. Complete this exercise __________ times a day. Straight leg raises, prone A person doing leg raises while lying on her belly on a bed. The person raises one leg up off the bed.  This exercise stretches the muscles that move the hips (hip extensors). Lie on your abdomen on a firm surface (prone position). Tense the muscles in your buttocks and lift your left / right leg about 4 inches (10 cm). Keep your knee straight as you lift your leg. If you cannot lift your leg that high without arching your back, place a pillow under your hips. Hold this position for __________ seconds. Slowly lower your leg to the starting position. Let your muscles relax completely after each repetition. Repeat __________ times. Complete this exercise __________ times a day. This information is not intended to replace advice given to you by your health care provider. Make sure you discuss any questions you have with your health care provider. Document Revised: 07/27/2020 Document Reviewed: 07/27/2020 Elsevier Patient Education  [place].

## 2021-03-08 NOTE — Progress Notes (Signed)
Established Patient Office Visit  Subjective:  Patient ID: Sandy Salinas, female    DOB: 1950/06/02  Age: 71 y.o. MRN: [id number]  CC:  Chief Complaint  Patient presents with   Sciatica    Right side    HPI Sandy Salinas presents for right sided lumbar pain and sciatica.   RIGHT BACK PAIN Duration: 5 days Mechanism of injury: no trauma Location: Right, at the level of L4 radiating into right piriformis muscle  Onset: gradual Severity: severe Quality: pulling Frequency: constant Radiation: buttocks Aggravating factors: none Alleviating factors: nothing Status: stable Treatments attempted: rest, ice, heat, ibuprofen, and aleve  Relief with NSAIDs?: no Nighttime pain:   if turns on right side   but doesn't wake from sleep Paresthesias / decreased sensation:  no Bowel / bladder incontinence:  no Fevers:  no Dysuria / urinary frequency:  no  Has taken Zanaflex before but had confusion and grogginess. Cannot take NSAIDs because of Plavix.   Past Medical History:  Diagnosis Date   Abdominal aortic ectasia ([place]) 05/19/2016   [place] April 2018, rescan in five years (April 2023)   Breast calcification seen on mammogram 12/01/2016   LEFT   Cataract    Diabetes mellitus without complication ([place])    Family history of abdominal aortic aneurysm (AAA) 05/12/2016   father   Hyperlipidemia    Hypertension    Obesity    Osteoporosis    Primary osteoarthritis of left knee    Psoriasis, unspecified    Seizures ([place])     Past Surgical History:  Procedure Laterality Date   ABDOMINAL HYSTERECTOMY  1975   total   BREAST BIOPSY Right 04/09/2012   x 2, FIBROADENOMA WITH COARSE CALCIFICATIONS.done by byrnett   BREAST BIOPSY Left 09/04/2017   Affirm Bx- X clip, FIBROADENOMATOUS CHANGE WITH CALCIFICATIONS. NEGATIVE FOR ATYPIA AND MALIGNANCY   CYSTECTOMY  2003   ROUX-EN-Y GASTRIC BYPASS  2009   TONSILLECTOMY  1970   TUBAL LIGATION  1975    Family History  Problem  Relation Age of Onset   Alzheimer's disease Mother    Heart murmur Mother    Dementia Mother    Arthritis Mother    AAA (abdominal aortic aneurysm) Father    Heart disease Father    Asthma Sister    Hypertension Sister    Cancer Sister        breast   Breast cancer Sister        late 79's   Heart disease Brother    Hypertension Brother    Aneurysm Paternal Aunt    Lung cancer Paternal Uncle    Cancer Paternal Grandmother        possible, not sure what kind   Cancer Cousin     Social History   Socioeconomic History   Marital status: Married    Spouse name: Doctor, general practice   Number of children: 2   Years of education: Not on file   Highest education level: Some college, no degree  Occupational History   Occupation: retired  Tobacco Use   Smoking status: Never   Smokeless tobacco: Never  Vaping Use   Vaping Use: Never used  Substance and Sexual Activity   Alcohol use: No   Drug use: No   Sexual activity: Not Currently    Birth control/protection: Abstinence    Comment: Old Age  Other Topics Concern   Not on file  Social History Narrative   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  Social Determinants of Health   Financial Resource Strain: Low Risk    Difficulty of Paying Living Expenses: Not very hard  Food Insecurity: No Food Insecurity   Worried About Charity fundraiser in the Last Year: Never true   Ran Out of Food in the Last Year: Never true  Transportation Needs: No Transportation Needs   Lack of Transportation (Medical): No   Lack of Transportation (Non-Medical): No  Physical Activity: Inactive   Days of Exercise per Week: 0 days   Minutes of Exercise per Session: 0 min  Stress: No Stress Concern Present   Feeling of Stress : Not at all  Social Connections: Moderately Isolated   Frequency of Communication with Friends and Family: More than three times a week   Frequency of Social Gatherings with Friends and Family: More than three times a week    Attends Religious Services: Never   Marine scientist or Organizations: No   Attends Music therapist: Never   Marital Status: Married  Human resources officer Violence: Not At Risk   Fear of Current or Ex-Partner: No   Emotionally Abused: No   Physically Abused: No   Sexually Abused: No    Outpatient Medications Prior to Visit  Medication Sig Dispense Refill   atorvastatin (LIPITOR) 80 MG tablet TAKE 1 TABLET (80 MG) BY MOUTH AT BEDTIME 90 tablet 3   Calcium Carbonate-Vitamin D (CALTRATE 600+D PO) Take by mouth daily.     chlorthalidone (HYGROTON) 25 MG tablet Take 1 tablet (25 mg total) by mouth daily. (Patient taking differently: Take 25 mg by mouth daily. Pt taking QHS) 90 tablet 3   Cholecalciferol (VITAMIN D) 2000 units tablet Pt taking daily QHS     clopidogrel (PLAVIX) 75 MG tablet Take 75 mg by mouth daily. Pt taking QHS     empagliflozin (JARDIANCE) 10 MG TABS tablet Take 1 tablet (10 mg total) by mouth daily before breakfast. 30 tablet 3   gabapentin (NEURONTIN) 100 MG capsule Take 100 mg by mouth at bedtime.     lisinopril (ZESTRIL) 10 MG tablet Take 1 tablet (10 mg total) by mouth daily. (Patient taking differently: Take 10 mg by mouth daily. Pt taking QHS) 90 tablet 3   Magnesium 100 MG TABS Take by mouth.     metFORMIN (GLUCOPHAGE) 1000 MG tablet Take 0.5 tablets (500 mg total) by mouth 2 (two) times daily with a meal. 180 tablet 2   mirabegron ER (MYRBETRIQ) 25 MG TB24 tablet Take 1 tablet (25 mg total) by mouth daily. 30 tablet 11   Multiple Vitamin (MULTIVITAMIN) tablet Take 1 tablet by mouth daily. Pt taking QHS     Potassium 99 MG TABS Take by mouth.     TURMERIC PO Take 1 capsule by mouth at bedtime.     vitamin B-12 (CYANOCOBALAMIN) 100 MCG tablet Take by mouth daily. Pt taking 1000 mcg daily QHS     vitamin E 100 UNIT capsule Take by mouth daily. Pt taking QHS     DULoxetine (CYMBALTA) 30 MG capsule Take 30 mg by mouth at bedtime.     No  facility-administered medications prior to visit.    Allergies  Allergen Reactions   Codeine Nausea Only and Other (See Comments)    Hallucinations    ROS Review of Systems  Constitutional:  Negative for chills and fever.  Genitourinary:  Negative for dysuria, frequency and urgency.  Musculoskeletal:  Positive for back pain and gait problem.  Neurological:  Negative for weakness and numbness.     Objective:    Physical Exam Constitutional:      Appearance: Normal appearance.  HENT:     Head: Normocephalic and atraumatic.  Eyes:     Conjunctiva/sclera: Conjunctivae normal.  Cardiovascular:     Rate and Rhythm: Normal rate and regular rhythm.  Pulmonary:     Effort: Pulmonary effort is normal.     Breath sounds: Normal breath sounds.  Musculoskeletal:        General: Tenderness present.     Right lower leg: No edema.     Left lower leg: No edema.  Skin:    General: Skin is warm and dry.  Neurological:     General: No focal deficit present.     Mental Status: She is alert. Mental status is at baseline.  Psychiatric:        Mood and Affect: Mood normal.        Behavior: Behavior normal.   Back Exam:    Inspection:  Normal spinal curvature.  No deformity, ecchymosis, erythema, or lesions   Curvature: Normal   Deformity: no  Ecchymosis: no   Erythema:  no   Lesions: no    Palpation:     Midline spinal tenderness: no     Paralumbar tenderness: yes Right     Parathoracic tenderness: no        Buttocks tenderness: yesRight     Range of Motion:      Flexion: Normal     Extension:Normal     Lateral bending:Normal    Rotation:Normal    Neuro Exam:Lower extremity DTRs normal & symmetric.  Strength and sensation intact.    Special Tests:      Straight leg raise:positive on right      Stork test: negative  BP 122/64    Pulse 80    Resp 16    Ht '5\' 4"'  (1.626 m)    Wt 171 lb (77.6 kg)    SpO2 97%    BMI 29.35 kg/m  Wt Readings from Last 3 Encounters:  03/08/21 171  lb (77.6 kg)  02/24/21 169 lb 3.2 oz (76.7 kg)  02/04/21 170 lb 6.4 oz (77.3 kg)     Health Maintenance Due  Topic Date Due   Zoster Vaccines- Shingrix (1 of 2) Never done   OPHTHALMOLOGY EXAM  08/19/2020   COVID-19 Vaccine (6 - Booster for Pfizer series) 12/16/2020    There are no preventive care reminders to display for this patient.  Lab Results  Component Value Date   TSH 1.56 02/24/2021   Lab Results  Component Value Date   WBC 10.0 02/24/2021   HGB 12.8 02/24/2021   HCT 40.2 02/24/2021   MCV 84.8 02/24/2021   PLT 521 (H) 02/24/2021   Lab Results  Component Value Date   NA 139 02/24/2021   K 4.7 02/24/2021   CO2 35 (H) 02/24/2021   GLUCOSE 92 02/24/2021   BUN 19 02/24/2021   CREATININE 0.80 02/24/2021   BILITOT 0.3 02/24/2021   ALKPHOS 76 06/06/2016   AST 17 02/24/2021   ALT 7 02/24/2021   PROT 6.7 02/24/2021   ALBUMIN 4.1 06/06/2016   CALCIUM 9.7 02/24/2021   EGFR 79 02/24/2021   Lab Results  Component Value Date   CHOL 151 04/27/2020   Lab Results  Component Value Date   HDL 39 (L) 04/27/2020   Lab Results  Component Value Date   LDLCALC 77 04/27/2020   Lab  Results  Component Value Date   TRIG 265 (H) 04/27/2020   Lab Results  Component Value Date   CHOLHDL 3.9 04/27/2020   Lab Results  Component Value Date   HGBA1C 8.2 (A) 01/25/2021      Assessment & Plan:   1. Sciatica of right side: She has tried topical creams like BenGay, Voltaren, etc without relief, NSAIDs without relief.Lumbar spine x-rays reviewed from 10/2015, showing mild narrowing st L4-5 and L5-S1 facet arthropathy, both of which are chronic. Hamstrings and piriformis tight on exam, discussed gentle stretching everyday. Discussed physical therapy or trigger point injection, both of which the patient politely declines. She had a severe reaction to muscle relaxers in the past as well. We will try a low potency steroid pack to help relieve inflammation and pain. She is a  diabetic but well controlled, discussed how steroids will elevate her sugars for a few days. She will continue conservative measures such as moist heat, topical agents and gentle stretching. Should problem persists, consider repeat imaging, possibly EMG but did discuss how this condition can take 8-12 weeks to resolve.   - methylPREDNISolone (MEDROL DOSEPAK) 4 MG TBPK tablet; Day 1: Take 8 mg (2 tablets) before breakfast, 4 mg (1 tablet) after lunch, 4 mg (1 tablet) after supper, and 8 mg (2 tablets) at bedtime. Day 2:Take 4 mg (1 tablet) before breakfast, 4 mg (1 tablet) after lunch, 4 mg (1 tablet) after supper, and 8 mg (2 tablets) at bedtime. Day 3: Take 4 mg (1 tablet) before breakfast, 4 mg (1 tablet) after lunch, 4 mg (1 tablet) after supper, and 4 mg (1 tablet) at bedtime. Day 4: Take 4 mg (1 tablet) before breakfast, 4 mg (1 tablet) after lunch, and 4 mg (1 tablet) at bedtime. Day 5: Take 4 mg (1 tablet) before breakfast and 4 mg (1 tablet) at bedtime. Day 6: Take 4 mg (1 tablet) before breakfast.  Dispense: 1 each; Refill: 0   Follow-up: Return if symptoms worsen or fail to improve.    Teodora Medici, DO

## 2021-03-09 ENCOUNTER — Telehealth: Payer: Self-pay

## 2021-03-09 NOTE — Telephone Encounter (Signed)
Copied from CRM [id number]. Topic: General - Other >> Mar 09, 2021  8:13 AM Traci Sermon wrote: Reason for CRM: FYI-Pt called in stating she wanted to Thank Dr. Caralee Ates, she got some medication that helped, and she said she is feeling much better.

## 2021-03-11 ENCOUNTER — Ambulatory Visit: Payer: Medicare Other | Admitting: Nurse Practitioner

## 2021-03-14 ENCOUNTER — Ambulatory Visit: Payer: Self-pay

## 2021-03-14 NOTE — Telephone Encounter (Signed)
scheduled

## 2021-03-14 NOTE — Telephone Encounter (Signed)
° ° °  Chief Complaint: Pt. Saw blood in urine last night. Symptoms: Still having sciatica from last week. Finished steroid. Frequency: Saw blood last night Pertinent Negatives: Patient denies any other symptoms Disposition: [] ED /[] Urgent Care (no appt availability in office) / [] Appointment(In office/virtual)/ []  [facility]/ [] Home Care/ [] Refused Recommended Disposition /[] [facility]/ []  Follow-up with PCP Additional Notes: Pt. Asking to be worked in today. Please advise pt.   Answer Assessment - Initial Assessment Questions 1. SYMPTOM: "What's the main symptom you're concerned about?" (e.g., frequency, incontinence)     Blood in urine last night 2. ONSET: "When did the  blood  start?"     Last night  3. PAIN: "Is there any pain?" If Yes, ask: "How bad is it?" (Scale: 1-10; mild, moderate, severe)     No 4. CAUSE: "What do you think is causing the symptoms?"     UTI 5. OTHER SYMPTOMS: "Do you have any other symptoms?" (e.g., fever, flank pain, blood in urine, pain with urination)     Blood 6. PREGNANCY: "Is there any chance you are pregnant?" "When was your last menstrual period?"     No  Protocols used: Urinary Symptoms-A-AH

## 2021-03-15 ENCOUNTER — Telehealth: Payer: Self-pay

## 2021-03-15 ENCOUNTER — Encounter: Payer: Self-pay | Admitting: Internal Medicine

## 2021-03-15 ENCOUNTER — Ambulatory Visit (INDEPENDENT_AMBULATORY_CARE_PROVIDER_SITE_OTHER): Payer: Medicare Other | Admitting: Internal Medicine

## 2021-03-15 VITALS — BP 116/64 | HR 87 | Temp 98.4°F | Resp 16 | Ht 64.0 in | Wt 167.4 lb

## 2021-03-15 DIAGNOSIS — M5441 Lumbago with sciatica, right side: Secondary | ICD-10-CM | POA: Diagnosis not present

## 2021-03-15 DIAGNOSIS — R319 Hematuria, unspecified: Secondary | ICD-10-CM | POA: Diagnosis not present

## 2021-03-15 LAB — POCT URINALYSIS DIPSTICK
Bilirubin, UA: NEGATIVE
Blood, UA: NEGATIVE
Glucose, UA: POSITIVE — AB
Ketones, UA: NEGATIVE
Leukocytes, UA: NEGATIVE
Nitrite, UA: NEGATIVE
Odor: NORMAL
Protein, UA: NEGATIVE
Spec Grav, UA: 1.01 (ref 1.010–1.025)
Urobilinogen, UA: 0.2 E.U./dL
pH, UA: 6 (ref 5.0–8.0)

## 2021-03-15 MED ORDER — TAMSULOSIN HCL 0.4 MG PO CAPS: 0.4000 mg | ORAL_CAPSULE | Freq: Every day | ORAL | 3 refills | Status: DC

## 2021-03-15 NOTE — Progress Notes (Signed)
Acute Office Visit  Subjective:    Patient ID: Sandy Salinas, female    DOB: 27-Aug-1950, 71 y.o.   MRN: [id number]  Chief Complaint  Patient presents with   Hematuria    With back pain     HPI Patient is in today for concerns about UTI. Experienced gross blood in the urine Sunday night 4x and then resolved by the next day. Denies abdominal pain, blood in stools, fevers, trauma, other urinary symptoms. She is having some more bruising but is on Plavix. She took Medrol dosepack for sciatica last week, which helped for 1-2 days and then stopped working.  Denies medication changes other than Medrol dosepack, low risk for STDs, no trauma recently.   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/2016   [place] April 2018, rescan in five years (April 2023)   Breast calcification seen on mammogram 12/01/2016   LEFT   Cataract    Diabetes mellitus without complication ([place])    Family history of abdominal aortic aneurysm (AAA) 05/12/2016   father   Hyperlipidemia    Hypertension    Obesity    Osteoporosis    Primary osteoarthritis of left knee    Psoriasis, unspecified    Seizures ([place])     Past Surgical History:  Procedure Laterality Date   ABDOMINAL HYSTERECTOMY  1975   total   BREAST BIOPSY Right 04/09/2012   x 2, FIBROADENOMA WITH COARSE CALCIFICATIONS.done by byrnett   BREAST BIOPSY Left 09/04/2017   Affirm Bx- X clip, FIBROADENOMATOUS CHANGE WITH CALCIFICATIONS. NEGATIVE FOR ATYPIA AND MALIGNANCY   CYSTECTOMY  2003   ROUX-EN-Y GASTRIC BYPASS  2009   TONSILLECTOMY  1970   TUBAL LIGATION  1975    Family History  Problem Relation Age of Onset   Alzheimer's disease Mother    Heart murmur Mother    Dementia Mother     Arthritis Mother    AAA (abdominal aortic aneurysm) Father    Heart disease Father    Asthma Sister    Hypertension Sister    Cancer Sister        breast   Breast cancer Sister        late 39's   Heart disease Brother    Hypertension Brother    Aneurysm Paternal Aunt    Lung cancer Paternal Uncle    Cancer Paternal Grandmother        possible, not sure what kind   Cancer Cousin     Social History   Socioeconomic History   Marital status: Married    Spouse name: Doctor, general practice   Number of children: 2   Years of education: Not on file   Highest education level: Some college, no degree  Occupational History   Occupation: retired  Tobacco Use   Smoking status: Never   Smokeless tobacco: Never  Vaping Use   Vaping Use: Never used  Substance and Sexual Activity   Alcohol use: No   Drug use: No   Sexual activity: Not Currently    Birth control/protection: Abstinence    Comment: Old Age  Other Topics Concern   Not on file  Social History Narrative   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    Social Determinants of Radio broadcast assistant  Strain: Low Risk    Difficulty of Paying Living Expenses: Not very hard  Food Insecurity: No Food Insecurity   Worried About Charity fundraiser in the Last Year: Never true   Ran Out of Food in the Last Year: Never true  Transportation Needs: No Transportation Needs   Lack of Transportation (Medical): No   Lack of Transportation (Non-Medical): No  Physical Activity: Inactive   Days of Exercise per Week: 0 days   Minutes of Exercise per Session: 0 min  Stress: No Stress Concern Present   Feeling of Stress : Not at all  Social Connections: Moderately Isolated   Frequency of Communication with Friends and Family: More than three times a week   Frequency of Social Gatherings with Friends and Family: More than three times a week   Attends Religious Services: Never   Marine scientist or Organizations: No   Attends Programme researcher, broadcasting/film/video: Never   Marital Status: Married  Human resources officer Violence: Not At Risk   Fear of Current or Ex-Partner: No   Emotionally Abused: No   Physically Abused: No   Sexually Abused: No    Outpatient Medications Prior to Visit  Medication Sig Dispense Refill   atorvastatin (LIPITOR) 80 MG tablet TAKE 1 TABLET (80 MG) BY MOUTH AT BEDTIME 90 tablet 3   Calcium Carbonate-Vitamin D (CALTRATE 600+D PO) Take by mouth daily.     chlorthalidone (HYGROTON) 25 MG tablet Take 1 tablet (25 mg total) by mouth daily. (Patient taking differently: Take 25 mg by mouth daily. Pt taking QHS) 90 tablet 3   Cholecalciferol (VITAMIN D) 2000 units tablet Pt taking daily QHS     clopidogrel (PLAVIX) 75 MG tablet Take 75 mg by mouth daily. Pt taking QHS     DULoxetine (CYMBALTA) 30 MG capsule Take 30 mg by mouth at bedtime.     empagliflozin (JARDIANCE) 10 MG TABS tablet Take 1 tablet (10 mg total) by mouth daily before breakfast. 30 tablet 3   gabapentin (NEURONTIN) 100 MG capsule Take 100 mg by mouth at bedtime.     lisinopril (ZESTRIL) 10 MG tablet Take 1 tablet (10 mg total) by mouth daily. (Patient taking differently: Take 10 mg by mouth daily. Pt taking QHS) 90 tablet 3   Magnesium 100 MG TABS Take by mouth.     metFORMIN (GLUCOPHAGE) 1000 MG tablet Take 0.5 tablets (500 mg total) by mouth 2 (two) times daily with a meal. 180 tablet 2   methylPREDNISolone (MEDROL DOSEPAK) 4 MG TBPK tablet Day 1: Take 8 mg (2 tablets) before breakfast, 4 mg (1 tablet) after lunch, 4 mg (1 tablet) after supper, and 8 mg (2 tablets) at bedtime. Day 2:Take 4 mg (1 tablet) before breakfast, 4 mg (1 tablet) after lunch, 4 mg (1 tablet) after supper, and 8 mg (2 tablets) at bedtime. Day 3: Take 4 mg (1 tablet) before breakfast, 4 mg (1 tablet) after lunch, 4 mg (1 tablet) after supper, and 4 mg (1 tablet) at bedtime. Day 4: Take 4 mg (1 tablet) before breakfast, 4 mg (1 tablet) after lunch, and 4 mg (1 tablet) at  bedtime. Day 5: Take 4 mg (1 tablet) before breakfast and 4 mg (1 tablet) at bedtime. Day 6: Take 4 mg (1 tablet) before breakfast. 1 each 0   mirabegron ER (MYRBETRIQ) 25 MG TB24 tablet Take 1 tablet (25 mg total) by mouth daily. 30 tablet 11   Multiple Vitamin (MULTIVITAMIN) tablet Take 1  tablet by mouth daily. Pt taking QHS     Potassium 99 MG TABS Take by mouth.     TURMERIC PO Take 1 capsule by mouth at bedtime.     vitamin B-12 (CYANOCOBALAMIN) 100 MCG tablet Take by mouth daily. Pt taking 1000 mcg daily QHS     vitamin E 100 UNIT capsule Take by mouth daily. Pt taking QHS     No facility-administered medications prior to visit.    Allergies  Allergen Reactions   Codeine Nausea Only and Other (See Comments)    Hallucinations    Review of Systems  Constitutional:  Negative for chills, fatigue and fever.  Eyes:  Negative for visual disturbance.  Respiratory:  Negative for cough and shortness of breath.   Cardiovascular:  Negative for chest pain.  Gastrointestinal:  Negative for abdominal pain, blood in stool, nausea and vomiting.  Genitourinary:  Positive for hematuria. Negative for dysuria, flank pain, frequency and urgency.  Musculoskeletal:  Positive for back pain.  Skin: Negative.   Neurological:  Positive for numbness. Negative for weakness.  Hematological:  Bruises/bleeds easily.      Objective:    Physical Exam Constitutional:      Appearance: Normal appearance.  HENT:     Head: Normocephalic and atraumatic.  Eyes:     Conjunctiva/sclera: Conjunctivae normal.  Cardiovascular:     Rate and Rhythm: Normal rate and regular rhythm.  Pulmonary:     Effort: Pulmonary effort is normal.     Breath sounds: Normal breath sounds.  Abdominal:     General: Bowel sounds are normal. There is no distension.     Palpations: Abdomen is soft.     Tenderness: There is no abdominal tenderness. There is no right CVA tenderness, left CVA tenderness, guarding or rebound.   Musculoskeletal:        General: Tenderness present.     Right lower leg: No edema.     Left lower leg: No edema.     Comments: Bilateral lumbar tenderness, unchanged from last visit  Skin:    General: Skin is warm and dry.  Neurological:     General: No focal deficit present.     Mental Status: She is alert. Mental status is at baseline.  Psychiatric:        Mood and Affect: Mood normal.        Behavior: Behavior normal.    BP 116/64    Pulse 87    Temp 98.4 F (36.9 C)    Resp 16    Ht '5\' 4"'  (1.626 m)    Wt 167 lb 6.4 oz (75.9 kg)    SpO2 96%    BMI 28.73 kg/m  Wt Readings from Last 3 Encounters:  03/15/21 167 lb 6.4 oz (75.9 kg)  03/08/21 171 lb (77.6 kg)  02/24/21 169 lb 3.2 oz (76.7 kg)    Health Maintenance Due  Topic Date Due   Zoster Vaccines- Shingrix (1 of 2) Never done   OPHTHALMOLOGY EXAM  08/19/2020   COVID-19 Vaccine (6 - Booster for Pfizer series) 12/16/2020    There are no preventive care reminders to display for this patient.   Lab Results  Component Value Date   TSH 1.56 02/24/2021   Lab Results  Component Value Date   WBC 10.0 02/24/2021   HGB 12.8 02/24/2021   HCT 40.2 02/24/2021   MCV 84.8 02/24/2021   PLT 521 (H) 02/24/2021   Lab Results  Component Value Date   NA  139 02/24/2021   K 4.7 02/24/2021   CO2 35 (H) 02/24/2021   GLUCOSE 92 02/24/2021   BUN 19 02/24/2021   CREATININE 0.80 02/24/2021   BILITOT 0.3 02/24/2021   ALKPHOS 76 06/06/2016   AST 17 02/24/2021   ALT 7 02/24/2021   PROT 6.7 02/24/2021   ALBUMIN 4.1 06/06/2016   CALCIUM 9.7 02/24/2021   EGFR 79 02/24/2021   Lab Results  Component Value Date   CHOL 151 04/27/2020   Lab Results  Component Value Date   HDL 39 (L) 04/27/2020   Lab Results  Component Value Date   LDLCALC 77 04/27/2020   Lab Results  Component Value Date   TRIG 265 (H) 04/27/2020   Lab Results  Component Value Date   CHOLHDL 3.9 04/27/2020   Lab Results  Component Value Date   HGBA1C  8.2 (A) 01/25/2021       Assessment & Plan:  1. Hematuria, unspecified type/Right-sided low back pain with right-sided sciatica, unspecified chronicity: Multiple episodes of gross blood on Sunday that resolved, could have passed a stone. Back pain is the same today despite steroids. UA in the office negative for blood or infection. Due to ongoing back pain and now gross hematuria, a CT scan to rule out more stones. Started on Flomax, increase hydration.   - POCT Urinalysis Dipstick - CT ABD PELVIS WO CONTRAST W/NEXTRAST ([facility]); Future - tamsulosin (FLOMAX) 0.4 MG CAPS capsule; Take 1 capsule (0.4 mg total) by mouth daily.  Dispense: 30 capsule; Refill: [place], DO

## 2021-03-15 NOTE — Patient Instructions (Addendum)
It was great seeing you today!  Plan discussed at today's visit: -CT ordered today, we will call and schedule this test please call to get setup at [phone] -In the mean time, increase fluids and medication to help flush urine sent to pharmacy   Follow up in: TBD after CT  Take care and let us know if you have any questions or concerns prior to your next visit.  Dr. Rosana Berger  Hematuria, Adult Hematuria is blood in the urine. Blood may be visible in the urine, or it may be identified with a test. This condition can be caused by infections of the bladder, urethra, kidney, or prostate. Other possible causes include: Kidney stones. Cancer of the urinary tract. Too much calcium in the urine. Conditions that are passed from parent to child (inherited conditions). Exercise that requires a lot of energy. Infections can usually be treated with medicine, and a kidney stone usually will pass through your urine. If neither of these is the cause of your hematuria, more tests may be needed to identify the cause of your symptoms. It is very important to tell your health care provider about any blood in your urine, even if it is painless or the blood stops without treatment. Blood in the urine, when it happens and then stops and then happens again, can be a symptom of a very serious condition, including cancer. There is no pain in the initial stages of many urinary cancers. Follow these instructions at home: Medicines Take over-the-counter and prescription medicines only as told by your health care provider. If you were prescribed an antibiotic medicine, take it as told by your health care provider. Do not stop taking the antibiotic even if you start to feel better. Eating and drinking Drink enough fluid to keep your urine pale yellow. It is recommended that you drink 3-4 quarts (2.8-3.8 L) a day. If you have been diagnosed with an infection, drinking cranberry juice in addition to large amounts of  water is recommended. Avoid caffeine, tea, and carbonated beverages. These tend to irritate the bladder. Avoid alcohol because it may irritate the prostate (in males). General instructions If you have been diagnosed with a kidney stone, follow your health care provider's instructions about straining your urine to catch the stone. Empty your bladder often. Avoid holding urine for long periods of time. If you are female: After a bowel movement, wipe from front to back and use each piece of toilet paper only once. Empty your bladder before and after sex. Pay attention to any changes in your symptoms. Tell your health care provider about any changes or any new symptoms. It is up to you to get the results of any tests. Ask your health care provider, or the department that is doing the test, when your results will be ready. Keep all follow-up visits. This is important. Contact a health care provider if: You develop back pain. You have a fever or chills. You have nausea or vomiting. Your symptoms do not improve after 3 days. Your symptoms get worse. Get help right away if: You develop severe vomiting and are unable to take medicine without vomiting. You develop severe pain in your back or abdomen even though you are taking medicine. You pass a large amount of blood in your urine. You pass blood clots in your urine. You feel very weak or like you might faint. You faint. Summary Hematuria is blood in the urine. It has many possible causes. It is very important that you tell  your health care provider about any blood in your urine, even if it is painless or the blood stops without treatment. Take over-the-counter and prescription medicines only as told by your health care provider. Drink enough fluid to keep your urine pale yellow. This information is not intended to replace advice given to you by your health care provider. Make sure you discuss any questions you have with your health care  provider. Document Revised: 09/24/2019 Document Reviewed: 09/24/2019 Elsevier Patient Education  2022 [employer].

## 2021-03-15 NOTE — Addendum Note (Signed)
Addended by: Margarita Mail on: 03/15/2021 11:35 AM   Modules accepted: Orders

## 2021-03-15 NOTE — Telephone Encounter (Signed)
Copied from CRM [id number]. Topic: General - Other >> Mar 15, 2021  9:59 AM Traci Sermon wrote: Reason for CRM: Pt called in stating she was trying to schedule her referral for her CT and the office she called told her that the wrong order was sent, please advise.

## 2021-03-17 ENCOUNTER — Emergency Department: Payer: Medicare Other

## 2021-03-17 ENCOUNTER — Other Ambulatory Visit: Payer: Self-pay

## 2021-03-17 ENCOUNTER — Encounter: Payer: Self-pay | Admitting: Emergency Medicine

## 2021-03-17 ENCOUNTER — Observation Stay
Admission: EM | Admit: 2021-03-17 | Discharge: 2021-03-18 | Disposition: A | Discharge status: Home / Self Care | Payer: Medicare Other | Attending: Internal Medicine | Admitting: Internal Medicine

## 2021-03-17 DIAGNOSIS — E785 Hyperlipidemia, unspecified: Secondary | ICD-10-CM | POA: Diagnosis present

## 2021-03-17 DIAGNOSIS — F0280 Dementia in other diseases classified elsewhere without behavioral disturbance: Secondary | ICD-10-CM | POA: Insufficient documentation

## 2021-03-17 DIAGNOSIS — G309 Alzheimer's disease, unspecified: Secondary | ICD-10-CM | POA: Diagnosis not present

## 2021-03-17 DIAGNOSIS — I10 Essential (primary) hypertension: Secondary | ICD-10-CM | POA: Insufficient documentation

## 2021-03-17 DIAGNOSIS — Z9884 Bariatric surgery status: Secondary | ICD-10-CM | POA: Insufficient documentation

## 2021-03-17 DIAGNOSIS — Z79899 Other long term (current) drug therapy: Secondary | ICD-10-CM | POA: Insufficient documentation

## 2021-03-17 DIAGNOSIS — Z90710 Acquired absence of both cervix and uterus: Secondary | ICD-10-CM | POA: Diagnosis not present

## 2021-03-17 DIAGNOSIS — M545 Low back pain, unspecified: Secondary | ICD-10-CM | POA: Diagnosis present

## 2021-03-17 DIAGNOSIS — N12 Tubulo-interstitial nephritis, not specified as acute or chronic: Secondary | ICD-10-CM

## 2021-03-17 DIAGNOSIS — R935 Abnormal findings on diagnostic imaging of other abdominal regions, including retroperitoneum: Secondary | ICD-10-CM | POA: Insufficient documentation

## 2021-03-17 DIAGNOSIS — E1169 Type 2 diabetes mellitus with other specified complication: Secondary | ICD-10-CM | POA: Diagnosis not present

## 2021-03-17 DIAGNOSIS — D72829 Elevated white blood cell count, unspecified: Secondary | ICD-10-CM | POA: Diagnosis not present

## 2021-03-17 DIAGNOSIS — R319 Hematuria, unspecified: Secondary | ICD-10-CM | POA: Diagnosis not present

## 2021-03-17 DIAGNOSIS — I700 Atherosclerosis of aorta: Secondary | ICD-10-CM | POA: Diagnosis not present

## 2021-03-17 DIAGNOSIS — K912 Postsurgical malabsorption, not elsewhere classified: Secondary | ICD-10-CM | POA: Diagnosis not present

## 2021-03-17 DIAGNOSIS — F32A Depression, unspecified: Secondary | ICD-10-CM | POA: Diagnosis present

## 2021-03-17 DIAGNOSIS — Z20822 Contact with and (suspected) exposure to covid-19: Secondary | ICD-10-CM | POA: Insufficient documentation

## 2021-03-17 DIAGNOSIS — R109 Unspecified abdominal pain: Secondary | ICD-10-CM | POA: Diagnosis not present

## 2021-03-17 DIAGNOSIS — Z7984 Long term (current) use of oral hypoglycemic drugs: Secondary | ICD-10-CM | POA: Insufficient documentation

## 2021-03-17 HISTORY — DX: Tubulo-interstitial nephritis, not specified as acute or chronic: N12

## 2021-03-17 LAB — HEPATIC FUNCTION PANEL
ALT: 11 U/L (ref 0–44)
AST: 18 U/L (ref 15–41)
Albumin: 3.6 g/dL (ref 3.5–5.0)
Alkaline Phosphatase: 61 U/L (ref 38–126)
Bilirubin, Direct: 0.1 mg/dL (ref 0.0–0.2)
Indirect Bilirubin: 0.5 mg/dL (ref 0.3–0.9)
Total Bilirubin: 0.6 mg/dL (ref 0.3–1.2)
Total Protein: 6.9 g/dL (ref 6.5–8.1)

## 2021-03-17 LAB — URINALYSIS, ROUTINE W REFLEX MICROSCOPIC
Bilirubin Urine: NEGATIVE
Glucose, UA: >=500 mg/dL — AB
Ketones, ur: 5 mg/dL — AB
Nitrite: NEGATIVE
Protein, ur: 100 mg/dL — AB
RBC / HPF: >50 RBC/hpf — ABNORMAL HIGH (ref 0–5)
Specific Gravity, Urine: 1.024 (ref 1.005–1.030)
WBC, UA: >50 WBC/hpf — ABNORMAL HIGH (ref 0–5)
pH: 5 (ref 5.0–8.0)

## 2021-03-17 LAB — BASIC METABOLIC PANEL
Anion gap: 10 (ref 5–15)
BUN: 15 mg/dL (ref 8–23)
CO2: 27 mmol/L (ref 22–32)
Calcium: 9.5 mg/dL (ref 8.9–10.3)
Chloride: 97 mmol/L — ABNORMAL LOW (ref 98–111)
Creatinine, Ser: 0.76 mg/dL (ref 0.44–1.00)
GFR, Estimated: >60 mL/min (ref >60)
Glucose, Bld: 166 mg/dL — ABNORMAL HIGH (ref 70–99)
Potassium: 3.6 mmol/L (ref 3.5–5.1)
Sodium: 134 mmol/L — ABNORMAL LOW (ref 135–145)

## 2021-03-17 LAB — LIPASE, BLOOD: Lipase: 36 U/L (ref 11–51)

## 2021-03-17 LAB — CBC
HCT: 39.2 % (ref 36.0–46.0)
Hemoglobin: 12.4 g/dL (ref 12.0–15.0)
MCH: 26.8 pg (ref 26.0–34.0)
MCHC: 31.6 g/dL (ref 30.0–36.0)
MCV: 84.7 fL (ref 80.0–100.0)
Platelets: 611 10*3/uL — ABNORMAL HIGH (ref 150–400)
RBC: 4.63 MIL/uL (ref 3.87–5.11)
RDW: 14.9 % (ref 11.5–15.5)
WBC: 15.7 10*3/uL — ABNORMAL HIGH (ref 4.0–10.5)
nRBC: 0 % (ref 0.0–0.2)

## 2021-03-17 LAB — RESP PANEL BY RT-PCR (FLU A&B, COVID) ARPGX2
Influenza A by PCR: NEGATIVE
Influenza B by PCR: NEGATIVE
SARS Coronavirus 2 by RT PCR: NEGATIVE

## 2021-03-17 LAB — LACTIC ACID, PLASMA
Lactic Acid, Venous: 0.7 mmol/L (ref 0.5–1.9)
Lactic Acid, Venous: 1.1 mmol/L (ref 0.5–1.9)

## 2021-03-17 LAB — GLUCOSE, CAPILLARY: Glucose-Capillary: 243 mg/dL — ABNORMAL HIGH (ref 70–99)

## 2021-03-17 MED ORDER — INSULIN ASPART 100 UNIT/ML IJ SOLN
0.0000 [IU] | Freq: Every day | INTRAMUSCULAR | Status: DC
  Administered 2021-03-17: 2 [IU] via SUBCUTANEOUS
  Filled 2021-03-17: qty 1

## 2021-03-17 MED ORDER — HEPARIN SODIUM (PORCINE) 5000 UNIT/ML IJ SOLN
5000.0000 [IU] | Freq: Three times a day (TID) | INTRAMUSCULAR | Status: DC
  Administered 2021-03-17 – 2021-03-18 (×2): 5000 [IU] via SUBCUTANEOUS
  Filled 2021-03-17 (×2): qty 1

## 2021-03-17 MED ORDER — SODIUM CHLORIDE 0.9 % IV SOLN
2.0000 g | Freq: Once | INTRAVENOUS | Status: AC
  Administered 2021-03-17: 2 g via INTRAVENOUS
  Filled 2021-03-17: qty 20

## 2021-03-17 MED ORDER — MORPHINE SULFATE (PF) 2 MG/ML IV SOLN: 2.0000 mg | INTRAVENOUS | Status: DC | PRN

## 2021-03-17 MED ORDER — GABAPENTIN 100 MG PO CAPS
100.0000 mg | ORAL_CAPSULE | Freq: Every day | ORAL | Status: DC
  Administered 2021-03-17: 100 mg via ORAL
  Filled 2021-03-17: qty 1

## 2021-03-17 MED ORDER — LISINOPRIL 10 MG PO TABS
10.0000 mg | ORAL_TABLET | Freq: Every day | ORAL | Status: DC
  Administered 2021-03-17: 10 mg via ORAL
  Filled 2021-03-17: qty 1

## 2021-03-17 MED ORDER — CHLORTHALIDONE 25 MG PO TABS
25.0000 mg | ORAL_TABLET | Freq: Every day | ORAL | Status: DC
  Administered 2021-03-17: 25 mg via ORAL
  Filled 2021-03-17 (×2): qty 1

## 2021-03-17 MED ORDER — SODIUM CHLORIDE 0.9 % IV BOLUS
1000.0000 mL | Freq: Once | INTRAVENOUS | Status: AC
Start: 2021-03-17 — End: 2021-03-17
  Administered 2021-03-17: 1000 mL via INTRAVENOUS

## 2021-03-17 MED ORDER — ONDANSETRON HCL 4 MG/2ML IJ SOLN: 4.0000 mg | Freq: Four times a day (QID) | INTRAMUSCULAR | Status: DC | PRN

## 2021-03-17 MED ORDER — ONDANSETRON HCL 4 MG/2ML IJ SOLN
4.0000 mg | Freq: Once | INTRAMUSCULAR | Status: AC
  Administered 2021-03-17: 4 mg via INTRAVENOUS
  Filled 2021-03-17: qty 2

## 2021-03-17 MED ORDER — SODIUM CHLORIDE 0.9 % IV SOLN
2.0000 g | INTRAVENOUS | Status: DC
  Filled 2021-03-17: qty 20

## 2021-03-17 MED ORDER — HYDRALAZINE HCL 10 MG PO TABS
10.0000 mg | ORAL_TABLET | Freq: Four times a day (QID) | ORAL | Status: DC | PRN
  Filled 2021-03-17: qty 1

## 2021-03-17 MED ORDER — ONDANSETRON HCL 4 MG PO TABS: 4.0000 mg | ORAL_TABLET | Freq: Four times a day (QID) | ORAL | Status: DC | PRN

## 2021-03-17 MED ORDER — ACETAMINOPHEN 325 MG PO TABS: 650.0000 mg | ORAL_TABLET | Freq: Four times a day (QID) | ORAL | Status: DC | PRN

## 2021-03-17 MED ORDER — INSULIN ASPART 100 UNIT/ML IJ SOLN
0.0000 [IU] | Freq: Three times a day (TID) | INTRAMUSCULAR | Status: DC
  Administered 2021-03-18: 2 [IU] via SUBCUTANEOUS
  Filled 2021-03-17: qty 1

## 2021-03-17 MED ORDER — KETOROLAC TROMETHAMINE 30 MG/ML IJ SOLN: 15.0000 mg | Freq: Four times a day (QID) | INTRAMUSCULAR | Status: DC | PRN

## 2021-03-17 MED ORDER — ACETAMINOPHEN 650 MG RE SUPP: 650.0000 mg | Freq: Four times a day (QID) | RECTAL | Status: DC | PRN

## 2021-03-17 MED ORDER — METFORMIN HCL 500 MG PO TABS
500.0000 mg | ORAL_TABLET | Freq: Two times a day (BID) | ORAL | Status: DC
  Administered 2021-03-18: 500 mg via ORAL
  Filled 2021-03-17: qty 1

## 2021-03-17 MED ORDER — ATORVASTATIN CALCIUM 20 MG PO TABS
80.0000 mg | ORAL_TABLET | Freq: Every day | ORAL | Status: DC
  Administered 2021-03-17: 80 mg via ORAL
  Filled 2021-03-17: qty 4

## 2021-03-17 MED ORDER — FENTANYL CITRATE PF 50 MCG/ML IJ SOSY
50.0000 ug | PREFILLED_SYRINGE | Freq: Once | INTRAMUSCULAR | Status: AC
  Administered 2021-03-17: 50 ug via INTRAVENOUS
  Filled 2021-03-17: qty 1

## 2021-03-17 NOTE — Assessment & Plan Note (Signed)
-   Resumed home chlorthalidone 25 mg nightly, lisinopril 10 mg nightly - Hydralazine 10 mg p.o. every 6 hours as needed for SBP greater than 175

## 2021-03-17 NOTE — ED Provider Notes (Signed)
[facility] Provider Note    Event Date/Time   First MD Initiated Contact with Patient 03/17/21 1914     (approximate)   History   Back Pain   HPI  Sandy Salinas is a 71 y.o. female with diabetes, hypertension, hyperlipidemia who comes in with concerns for abdominal pain, back pain.  Patient reports that she is having a lot of pain on her right side.  She is scheduled for a CT scan Monday but could not get in until then.  She reports having some hematuria few days ago that is now resolved.  She then reports some pain going up into her right side that is been getting worse.  No history of kidney stones but patient was started on Flomax and reported to increase hydration.  At that time her UA was reassuring.  She does report a lot of pressure with attempts at urination.  Denies any known fevers  Physical Exam   Triage Vital Signs: ED Triage Vitals  Enc Vitals Group     BP 03/17/21 1801 (!) 162/75     Pulse Rate 03/17/21 1801 84     Resp 03/17/21 1801 17     Temp 03/17/21 1801 99 F (37.2 C)     Temp Source 03/17/21 1801 Oral     SpO2 03/17/21 1801 96 %     Weight 03/17/21 1810 167 lb 5.3 oz (75.9 kg)     Height 03/17/21 1810 5\' 4"  (1.626 m)     Head Circumference --      Peak Flow --      Pain Score 03/17/21 1810 10     Pain Loc --      Pain Edu? --      Excl. in GC? --     Most recent vital signs: Vitals:   03/17/21 1801 03/17/21 2010  BP: (!) 162/75 (!) 162/70  Pulse: 84 78  Resp: 17 20  Temp: 99 F (37.2 C)   SpO2: 96% 94%     General: Awake, no distress. \ CV:  Good peripheral perfusion.  Resp:  Normal effort.  Abd:  No distention.  Other:  CVA tenderness on the right.   ED Results / Procedures / Treatments   Labs (all labs ordered are listed, but only abnormal results are displayed) Labs Reviewed  URINALYSIS, ROUTINE W REFLEX MICROSCOPIC - Abnormal; Notable for the following components:      Result Value   Color, Urine  YELLOW (*)    APPearance CLOUDY (*)    Glucose, UA >=500 (*)    Hgb urine dipstick LARGE (*)    Ketones, ur 5 (*)    Protein, ur 100 (*)    Leukocytes,Ua MODERATE (*)    RBC / HPF >50 (*)    WBC, UA >50 (*)    Bacteria, UA RARE (*)    All other components within normal limits  BASIC METABOLIC PANEL - Abnormal; Notable for the following components:   Sodium 134 (*)    Chloride 97 (*)    Glucose, Bld 166 (*)    All other components within normal limits  CBC - Abnormal; Notable for the following components:   WBC 15.7 (*)    Platelets 611 (*)    All other components within normal limits  RESP PANEL BY RT-PCR (FLU A&B, COVID) [id number]  URINE CULTURE  CULTURE, BLOOD (ROUTINE X 2)  CULTURE, BLOOD (ROUTINE X 2)  HEPATIC FUNCTION PANEL  LIPASE, BLOOD  LACTIC ACID, PLASMA  LACTIC ACID, PLASMA     RADIOLOGY I have reviewed the CT personally and there is no evidence of stone but concern for some pyelonephritis  PROCEDURES:  Critical Care performed: No  Procedures   MEDICATIONS ORDERED IN ED: Medications  cefTRIAXone (ROCEPHIN) 2 g in sodium chloride 0.9 % 100 mL IVPB (has no administration in time range)  sodium chloride 0.9 % bolus 1,000 mL (has no administration in time range)  fentaNYL (SUBLIMAZE) injection 50 mcg (50 mcg Intravenous Given 03/17/21 1955)  ondansetron (ZOFRAN) injection 4 mg (4 mg Intravenous Given 03/17/21 1955)     IMPRESSION / MDM / ASSESSMENT AND PLAN / ED COURSE  I reviewed the triage vital signs and the nursing notes.                              Differential diagnosis includes, but is not limited to, kidney stone, UTI, pyelonephritis.  Seems less likely appendicitis or bowel obstruction.  Patient was given some IV fentanyl and IV Zofran to help with symptoms.  Patient's white count is up to 15.7 and significantly elevated platelets. Her UA looks grossly infected with WBCs and RBCs we will send for culture BMP shows slightly low sodium and chloride  we will give some IV fluid   Patient CT scan is concerning for potentially recently passed stone versus pyelonephritis.  Given patient's white count, slightly elevated temperature I am more concerned about pyelonephritis.  We discussed admission given her age and her white count elevation for monitoring.  She feels comfortable with this plan.  We will start some IV ceftriaxone.  We will give a liter of fluid.  I did discuss with hospital team for admission   FINAL CLINICAL IMPRESSION(S) / ED DIAGNOSES   Final diagnoses:  Pyelonephritis     Rx / DC Orders   ED Discharge Orders     None        Note:  This document was prepared using Dragon voice recognition software and may include unintentional dictation errors.   Vanessa [place], MD 03/17/21 2041

## 2021-03-17 NOTE — H&P (Signed)
History and Physical   Sandy Salinas [id number] DOB: Jan 24, 1951 DOA: 03/17/2021  PCP: Teodora Medici, DO  Outpatient Specialists: Dr. Gabriel Carina, endocrinologist Patient coming from: Home  I have personally briefly reviewed patient's old medical records in [place].  Chief Concern: abdominal pain   HPI: Ms. Sandy Salinas is a 71 year old female with hypertension, hyperlipidemia, noninsulin-dependent diabetes mellitus who presents emergency department for chief concerns of low back pain and difficulty urinating with elevated blood pressure.  Vitals in the emergency department show temperature of 99, respiration rate of 17, heart rate of 84, initial blood pressure 162/75, SPO2 of 96% on room air.  Labs in the emergency department showed serum sodium 134, potassium 3.6, chloride 99, bicarb 27, BUN of 15, serum creatinine of 0.76, nonfasting blood glucose 166, GFR greater than 60, WBC elevated at 15.7, hemoglobin 12.4, platelets of 611.  UA showed positive moderate leukocytes.  CT renal stone study showed mild right periurethral stranding with mild perivesical stranding along the bladder neck/proximal urethra.  No ureteral or bladder calculi.  Differentials include recent passage of right ureteral calculus versus cystitis with ascending infection.  In the emergency department she received fentanyl injection 50 mcg, ondansetron 4 mg IV, ceftriaxone 2 g IV one-time dose, sodium chloride 1 L bolus.  At bedside, she is able to tell me her name, age, current calendar year.   She states that she has been having pressure at her suprapubic area, and difficulty urinating. She also had pain developing in her right upper abdomen and right sided back pain.   The right sided back pain started about two weeks ago and she was given methylprednisolone because it was diagnosed to be arthritic inflammation of her pelvis bone. The steroid did not give her relief.   She noticed the abdominal  pain and difficulty urinating on Sunday, 03/13/2021. On Sunday evening, she had multiple episodes of bleeding with urination and this stopped Monday. She urinated blood 4 times Sunday evening. She denies fever. She endorsed chills. She thought she noticed a stone when she urinated in the ED before her CT scan.   At the peak, the pain is a 10/10 and now is a 4/10.   Social history: She lives with her husband. She denies history of smoking, etoh, recreational drug use. She formerly worked as a Quarry manager and worked for the [employer].   Vaccination history: She is vaccinated for covid 19, fully.   ROS: Constitutional: no weight change, no fever, + chills ENT/Mouth: no sore throat, no rhinorrhea Eyes: no eye pain, no vision changes Cardiovascular: no chest pain, no dyspnea,  no edema, no palpitations Respiratory: no cough, no sputum, no wheezing Gastrointestinal: + nausea, no vomiting, no diarrhea, no constipation Genitourinary: no urinary incontinence, + dysuria, + hematuria Musculoskeletal: no arthralgias, no myalgias Skin: no skin lesions, no pruritus, Neuro: no weakness, no loss of consciousness, no syncope Psych: no anxiety, no depression, no decrease appetite Heme/Lymph: no bruising, no bleeding  ED Course: Discussed with emergency medicine provider, patient requiring hospitalization for chief concerns of pyelonephritis.  Assessment/Plan  Principal Problem:   Pyelonephritis Active Problems:   Diabetes mellitus type 2 in obese [facility])   Status post bariatric surgery   Hyperlipidemia   Hypertension   Post-resection malabsorption   Alzheimer's dementia without behavioral disturbance (HCC)   Leukocytosis   Depression   * Pyelonephritis Assessment & Plan - Continue with ceftriaxone 2 g IV, 4 additional dose ordered to complete 5 doses - Supportive measures with pain  control and as needed nausea medication - Pain control: Toradol 15 mg IV every 6 hours as needed for moderate pain for 1 day;  morphine 2 mg IV every 3 hours for severe pain, 4 doses ordered  Hypertension Assessment & Plan - Resumed home chlorthalidone 25 mg nightly, lisinopril 10 mg nightly - Hydralazine 10 mg p.o. every 6 hours as needed for SBP greater than 175  Depression Assessment & Plan - Resumed home duloxetine 30 mg nightly  Leukocytosis Assessment & Plan - Presumed secondary to pyelonephritis, treat as above  Hyperlipidemia Assessment & Plan - Resumed home atorvastatin 80 mg nightly  Chart reviewed.   03/15/2021: UA was negative for nitrates and leukocytes and blood.  She was diagnosed with hematuria presumed secondary to a stone that has passed.  She was prescribed tamsulosin 0.4 mg capsule daily.  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[place]) 05/19/2016   [place] April 2018, rescan in five years (April 2023)   Breast calcification seen on mammogram 12/01/2016   LEFT   Cataract    Depression 03/18/2021   Diabetes mellitus without complication ([place])    Family history of abdominal aortic aneurysm (AAA) 05/12/2016   father   Hyperlipidemia    Hypertension    Obesity    Osteoporosis    Primary osteoarthritis of left knee    Psoriasis, unspecified    Seizures ([place])    Past Surgical History:  Procedure Laterality Date   ABDOMINAL HYSTERECTOMY  1975   total   BREAST BIOPSY Right 04/09/2012   x 2, FIBROADENOMA WITH COARSE CALCIFICATIONS.done by byrnett   BREAST BIOPSY Left 09/04/2017   Affirm Bx- X clip, FIBROADENOMATOUS CHANGE WITH CALCIFICATIONS. NEGATIVE FOR ATYPIA AND MALIGNANCY   CYSTECTOMY  2003   ROUX-EN-Y GASTRIC BYPASS  2009   TONSILLECTOMY  1970    TUBAL LIGATION  1975   Social History:  reports that she has never smoked. She has never used smokeless tobacco. She reports that she does not drink alcohol and does not use drugs.  Allergies  Allergen Reactions   Codeine Nausea Only and Other (See Comments)    Hallucinations   Family History  Problem Relation Age of Onset   Alzheimer's disease Mother    Heart murmur Mother    Dementia Mother    Arthritis Mother    AAA (abdominal aortic aneurysm) Father    Heart disease Father    Asthma Sister    Hypertension Sister    Cancer Sister        breast   Breast cancer Sister        late 62's   Heart disease Brother    Hypertension Brother    Aneurysm Paternal Aunt    Lung cancer Paternal Uncle    Cancer Paternal Grandmother        possible, not sure what kind   Cancer Cousin    Family history: Family history reviewed and not pertinent.  Prior to Admission medications   Medication Sig Start Date End Date Taking? Authorizing Provider  atorvastatin (LIPITOR) 80 MG tablet TAKE 1 TABLET (80 MG) BY MOUTH AT BEDTIME 11/04/20   Delsa Grana, PA-C  Calcium Carbonate-Vitamin D (CALTRATE 600+D PO) Take by mouth daily.  [provider]  chlorthalidone (HYGROTON) 25 MG tablet Take 1 tablet (25 mg total) by mouth daily. Patient taking differently: Take 25 mg by mouth daily. Pt taking QHS 08/20/20   Delsa Grana, PA-C  Cholecalciferol (VITAMIN D) 2000 units tablet Pt taking daily QHS 02/19/17   Arnetha Courser, MD  clopidogrel (PLAVIX) 75 MG tablet Take 75 mg by mouth daily. Pt taking QHS 04/19/20   [provider]  DULoxetine (CYMBALTA) 30 MG capsule Take 30 mg by mouth at bedtime. 09/22/19 09/21/20  [provider]  empagliflozin (JARDIANCE) 10 MG TABS tablet Take 1 tablet (10 mg total) by mouth daily before breakfast. 02/04/21   Teodora Medici, DO  gabapentin (NEURONTIN) 100 MG capsule Take 100 mg by mouth at bedtime. 12/11/17   [provider]   lisinopril (ZESTRIL) 10 MG tablet Take 1 tablet (10 mg total) by mouth daily. Patient taking differently: Take 10 mg by mouth daily. Pt taking QHS 08/11/20   Delsa Grana, PA-C  Magnesium 100 MG TABS Take by mouth.    [provider]  metFORMIN (GLUCOPHAGE) 1000 MG tablet Take 0.5 tablets (500 mg total) by mouth 2 (two) times daily with a meal. 08/11/20   Delsa Grana, PA-C  methylPREDNISolone (MEDROL DOSEPAK) 4 MG TBPK tablet Day 1: Take 8 mg (2 tablets) before breakfast, 4 mg (1 tablet) after lunch, 4 mg (1 tablet) after supper, and 8 mg (2 tablets) at bedtime. Day 2:Take 4 mg (1 tablet) before breakfast, 4 mg (1 tablet) after lunch, 4 mg (1 tablet) after supper, and 8 mg (2 tablets) at bedtime. Day 3: Take 4 mg (1 tablet) before breakfast, 4 mg (1 tablet) after lunch, 4 mg (1 tablet) after supper, and 4 mg (1 tablet) at bedtime. Day 4: Take 4 mg (1 tablet) before breakfast, 4 mg (1 tablet) after lunch, and 4 mg (1 tablet) at bedtime. Day 5: Take 4 mg (1 tablet) before breakfast and 4 mg (1 tablet) at bedtime. Day 6: Take 4 mg (1 tablet) before breakfast. 03/08/21   Teodora Medici, DO  mirabegron ER (MYRBETRIQ) 25 MG TB24 tablet Take 1 tablet (25 mg total) by mouth daily. 11/04/20   Delsa Grana, PA-C  Multiple Vitamin (MULTIVITAMIN) tablet Take 1 tablet by mouth daily. Pt taking QHS    [provider]  Potassium 99 MG TABS Take by mouth.    [provider]  tamsulosin (FLOMAX) 0.4 MG CAPS capsule Take 1 capsule (0.4 mg total) by mouth daily. 03/15/21   Teodora Medici, DO  TURMERIC PO Take 1 capsule by mouth at bedtime.    [provider]  vitamin B-12 (CYANOCOBALAMIN) 100 MCG tablet Take by mouth daily. Pt taking 1000 mcg daily QHS    [provider]  vitamin E 100 UNIT capsule Take by mouth daily. Pt taking QHS    [provider]   Physical Exam: Vitals:   03/17/21 1810 03/17/21 2010 03/17/21 2030 03/17/21 2133  BP:  (!) 162/70 (!) 106/91  (!) 153/64  Pulse:  78 82 90  Resp:  20 18 16   Temp:   98.8 F (37.1 C) 98.3 F (36.8 C)  TempSrc:   Oral Oral  SpO2:  94% 95% 97%  Weight: 75.9 kg     Height: 5\' 4"  (1.626 m)      Constitutional: appears age-appropriate, NAD, calm, comfortable Eyes: PERRL, lids and conjunctivae normal ENMT: Mucous membranes are moist. Posterior pharynx clear of any exudate or lesions. Age-appropriate dentition. Hearing  appropriate Neck: normal, supple, no masses, no thyromegaly Respiratory: clear to auscultation bilaterally, no wheezing, no crackles. Normal respiratory effort. No accessory muscle use.  Cardiovascular: Regular rate and rhythm, no murmurs / rubs / gallops. No extremity edema. 2+ pedal pulses. No carotid bruits.  Abdomen: no tenderness, no masses palpated, no hepatosplenomegaly. Bowel sounds positive.  Musculoskeletal: no clubbing / cyanosis. No joint deformity upper and lower extremities. Good ROM, no contractures, no atrophy. Normal muscle tone.  Skin: no rashes, lesions, ulcers. No induration Neurologic: Sensation intact. Strength 5/5 in all 4.  Psychiatric: Normal judgment and insight. Alert and oriented x 3. Normal mood.   EKG: Not indicated at the time of this admission  Chest x-ray on Admission: Not indicated at the time of admission  CT Renal Stone Study  Result Date: 03/17/2021 CLINICAL DATA:  Right flank pain, hematuria EXAM: CT ABDOMEN AND PELVIS WITHOUT CONTRAST TECHNIQUE: Multidetector CT imaging of the abdomen and pelvis was performed following the standard protocol without IV contrast. RADIATION DOSE REDUCTION: This exam was performed according to the departmental dose-optimization program which includes automated exposure control, adjustment of the mA and/or kV according to patient size and/or use of iterative reconstruction technique. COMPARISON:  None. FINDINGS: Lower chest: Lung bases are clear. Hepatobiliary: Unenhanced liver is unremarkable. Gallbladder is notable for  mild layering sludge, without associated inflammatory changes. No intrahepatic or extrahepatic ductal dilatation. Pancreas: Within normal limits. Spleen: Within normal limits. Adrenals/Urinary Tract: Adrenal glands are within normal limits. Kidneys are within normal limits. No renal calculi or hydronephrosis. Mild periureteral stranding along the mid/distal right ureter (series 2/image 59). No ureteral or bladder calculi. Mild perivesical stranding along the bladder neck/proximal urethra (series 2/image 86). Stomach/Bowel: Status post gastric bypass with patent gastrojejunostomy. No evidence of bowel obstruction. Normal appendix (series 2/image 58). No colonic wall thickening or inflammatory changes. Vascular/Lymphatic: No evidence of abdominal aortic aneurysm. Atherosclerotic calcifications of the abdominal aorta and branch vessels. To lymph nodes Reproductive: Status post hysterectomy. No adnexal masses. Other: No abdominopelvic ascites. Musculoskeletal: Degenerative changes of the visualized thoracolumbar spine. Mild anterior wedging at T12 and L1, likely chronic. IMPRESSION: Mild right periureteral stranding and mild perivesical stranding along the bladder neck/proximal urethra. No ureteral or bladder calculi. Differential considerations include recent passage of a right ureteral calculus versus cystitis with ascending infection (favored). Electronically Signed   By: Julian Hy M.D.   On: 03/17/2021 19:41    Labs on Admission: I have personally reviewed following labs  CBC: Recent Labs  Lab 03/17/21 1811  WBC 15.7*  HGB 12.4  HCT 39.2  MCV 84.7  PLT [id number]*   Basic Metabolic Panel: Recent Labs  Lab 03/17/21 1811  NA 134*  K 3.6  CL 97*  CO2 27  GLUCOSE 166*  BUN 15  CREATININE 0.76  CALCIUM 9.5   GFR: Estimated Creatinine Clearance: 65.3 mL/min (by C-G formula based on SCr of 0.76 mg/dL).  Liver Function Tests: Recent Labs  Lab 03/17/21 2007  AST 18  ALT 11  ALKPHOS 61   BILITOT 0.6  PROT 6.9  ALBUMIN 3.6   Recent Labs  Lab 03/17/21 2007  LIPASE 36   Urine analysis:    Component Value Date/Time   COLORURINE YELLOW (A) 03/17/2021 1811   APPEARANCEUR CLOUDY (A) 03/17/2021 1811   LABSPEC 1.024 03/17/2021 1811   PHURINE 5.0 03/17/2021 1811   GLUCOSEU >=500 (A) 03/17/2021 1811   HGBUR LARGE (A) 03/17/2021 1811   BILIRUBINUR NEGATIVE 03/17/2021 1811   BILIRUBINUR neg  03/15/2021 0914   KETONESUR 5 (A) 03/17/2021 1811   PROTEINUR 100 (A) 03/17/2021 1811   UROBILINOGEN 0.2 03/15/2021 0914   NITRITE NEGATIVE 03/17/2021 1811   LEUKOCYTESUR MODERATE (A) 03/17/2021 1811   Dr. Tobie Poet [facility]  If 7PM-7AM, please contact overnight-coverage provider If 7AM-7PM, please contact day coverage provider [email or web address]  03/18/2021, 1:18 AM

## 2021-03-17 NOTE — Assessment & Plan Note (Addendum)
-   Continue with ceftriaxone 2 g IV, 4 additional dose ordered to complete 5 doses - Supportive measures with pain control and as needed nausea medication - Pain control: Toradol 15 mg IV every 6 hours as needed for moderate pain for 1 day; morphine 2 mg IV every 3 hours for severe pain, 4 doses ordered

## 2021-03-17 NOTE — Assessment & Plan Note (Signed)
-   Resumed home atorvastatin 80 mg nightly ?

## 2021-03-17 NOTE — ED Triage Notes (Signed)
Pt comes into the ED via POV c/o right side low back pain that she thinks might be a kidney stone due to difficulty with urinating and having blood present in her urine.  Pt states that she has a CT scan scheduled for Monday, but she doesn't feel like she can wait that long. Pt currently in NAD at this time with even and unlabored respirations and is ambulatory to triage.

## 2021-03-17 NOTE — Hospital Course (Signed)
Sandy Salinas is a 71 year old female with hypertension, hyperlipidemia, noninsulin-dependent diabetes mellitus who presents emergency department for chief concerns of low back pain and difficulty urinating with elevated blood pressure.  Vitals in the emergency department show temperature of 99, respiration rate of 17, heart rate of 84, initial blood pressure 162/75, SPO2 of 96% on room air.  Labs in the emergency department showed serum sodium 134, potassium 3.6, chloride 99, bicarb 27, BUN of 15, serum creatinine of 0.76, nonfasting blood glucose 166, GFR greater than 60, WBC elevated at 15.7, hemoglobin 12.4, platelets of 611.  UA showed positive moderate leukocytes.  CT renal stone study showed mild right periurethral stranding with mild perivesical stranding along the bladder neck/proximal urethra.  No ureteral or bladder calculi.  Differentials include recent passage of right ureteral calculus versus cystitis with ascending infection.  In the emergency department she received fentanyl injection 50 mcg, ondansetron 4 mg IV, ceftriaxone 2 g IV one-time dose, sodium chloride 1 L bolus.

## 2021-03-18 ENCOUNTER — Encounter: Payer: Self-pay | Admitting: Internal Medicine

## 2021-03-18 ENCOUNTER — Telehealth: Payer: Self-pay | Admitting: Internal Medicine

## 2021-03-18 DIAGNOSIS — F32A Depression, unspecified: Secondary | ICD-10-CM

## 2021-03-18 DIAGNOSIS — N12 Tubulo-interstitial nephritis, not specified as acute or chronic: Secondary | ICD-10-CM | POA: Diagnosis not present

## 2021-03-18 DIAGNOSIS — D72829 Elevated white blood cell count, unspecified: Secondary | ICD-10-CM | POA: Diagnosis present

## 2021-03-18 HISTORY — DX: Depression, unspecified: F32.A

## 2021-03-18 LAB — CBC
HCT: 39.6 % (ref 36.0–46.0)
Hemoglobin: 12.5 g/dL (ref 12.0–15.0)
MCH: 26.5 pg (ref 26.0–34.0)
MCHC: 31.6 g/dL (ref 30.0–36.0)
MCV: 84.1 fL (ref 80.0–100.0)
Platelets: 584 10*3/uL — ABNORMAL HIGH (ref 150–400)
RBC: 4.71 MIL/uL (ref 3.87–5.11)
RDW: 15 % (ref 11.5–15.5)
WBC: 13.2 10*3/uL — ABNORMAL HIGH (ref 4.0–10.5)
nRBC: 0 % (ref 0.0–0.2)

## 2021-03-18 LAB — BASIC METABOLIC PANEL
Anion gap: 8 (ref 5–15)
BUN: 13 mg/dL (ref 8–23)
CO2: 29 mmol/L (ref 22–32)
Calcium: 9.2 mg/dL (ref 8.9–10.3)
Chloride: 100 mmol/L (ref 98–111)
Creatinine, Ser: 0.58 mg/dL (ref 0.44–1.00)
GFR, Estimated: >60 mL/min (ref >60)
Glucose, Bld: 127 mg/dL — ABNORMAL HIGH (ref 70–99)
Potassium: 3.9 mmol/L (ref 3.5–5.1)
Sodium: 137 mmol/L (ref 135–145)

## 2021-03-18 LAB — GLUCOSE, CAPILLARY: Glucose-Capillary: 125 mg/dL — ABNORMAL HIGH (ref 70–99)

## 2021-03-18 LAB — HIV ANTIBODY (ROUTINE TESTING W REFLEX): HIV Screen 4th Generation wRfx: NONREACTIVE

## 2021-03-18 MED ORDER — DULOXETINE HCL 30 MG PO CPEP: 30.0000 mg | ORAL_CAPSULE | Freq: Every day | ORAL | Status: DC

## 2021-03-18 MED ORDER — CIPROFLOXACIN HCL 500 MG PO TABS: 500.0000 mg | ORAL_TABLET | Freq: Two times a day (BID) | ORAL | 0 refills | Status: AC

## 2021-03-18 MED ORDER — CLOPIDOGREL BISULFATE 75 MG PO TABS
75.0000 mg | ORAL_TABLET | Freq: Every day | ORAL | Status: DC
  Administered 2021-03-18: 75 mg via ORAL
  Filled 2021-03-18: qty 1

## 2021-03-18 MED ORDER — OXYCODONE HCL 5 MG PO TABS: 5.0000 mg | ORAL_TABLET | Freq: Three times a day (TID) | ORAL | 0 refills | Status: DC | PRN

## 2021-03-18 MED ORDER — ADULT MULTIVITAMIN W/MINERALS CH
1.0000 | ORAL_TABLET | Freq: Every day | ORAL | Status: DC
  Administered 2021-03-18: 1 via ORAL
  Filled 2021-03-18: qty 1

## 2021-03-18 MED ORDER — VITAMIN B-12 1000 MCG PO TABS
1000.0000 ug | ORAL_TABLET | Freq: Every day | ORAL | Status: DC
  Administered 2021-03-18: 1000 ug via ORAL
  Filled 2021-03-18: qty 1

## 2021-03-18 MED ORDER — TAMSULOSIN HCL 0.4 MG PO CAPS
0.4000 mg | ORAL_CAPSULE | Freq: Every day | ORAL | Status: DC
  Administered 2021-03-18: 0.4 mg via ORAL
  Filled 2021-03-18: qty 1

## 2021-03-18 NOTE — Telephone Encounter (Signed)
Pt was notified.  

## 2021-03-18 NOTE — Assessment & Plan Note (Signed)
-   Resumed home duloxetine 30 mg nightly

## 2021-03-18 NOTE — Discharge Summary (Signed)
Physician Discharge Summary  AMZIE MARCK [id number] DOB: 1950/06/22 DOA: 03/17/2021  PCP: Teodora Medici, DO  Admit date: 03/17/2021 Discharge date: 03/18/2021  Admitted From: Home Disposition: Home  Recommendations for Outpatient Follow-up:  Follow up with PCP in 1-2 weeks   Home Health: No Equipment/Devices: None  Discharge Condition: Stable CODE STATUS: Full Diet recommendation: Regular  Brief/Interim Summary: 71 year old female with hypertension, hyperlipidemia, noninsulin-dependent diabetes mellitus who presents emergency department for chief concerns of low back pain and difficulty urinating with elevated blood pressure.   Vitals in the emergency department show temperature of 99, respiration rate of 17, heart rate of 84, initial blood pressure 162/75, SPO2 of 96% on room air.  Labs in the emergency department showed serum sodium 134, potassium 3.6, chloride 99, bicarb 27, BUN of 15, serum creatinine of 0.76, nonfasting blood glucose 166, GFR greater than 60, WBC elevated at 15.7, hemoglobin 12.4, platelets of 611.  UA showed positive moderate leukocytes.  CT renal stone study showed mild right periurethral stranding with mild perivesical stranding along the bladder neck/proximal urethra.  No ureteral or bladder calculi.  Differentials include recent passage of right ureteral calculus versus cystitis with ascending infection.  Received pain medication, antiemetics, intravenous antibiotics, intravenous fluids with good result.  On hospital day #2 patient is back to baseline.  Pain is resolved.  Will discharge home.  Convert to p.o. ciprofloxacin to complete an antibiotic course at time of discharge.  Follow-up outpatient PCP    Discharge Diagnoses:  Principal Problem:   Pyelonephritis Active Problems:   Diabetes mellitus type 2 in obese [facility])   Status post bariatric surgery   Hyperlipidemia   Hypertension   Post-resection malabsorption   Alzheimer's dementia  without behavioral disturbance ([place])   Leukocytosis   Depression  Pyelonephritis Did not meet sepsis criteria.  Received Rocephin in house.  Pain control achieved at time of discharge.  Discharged home.  Transition to p.o. ciprofloxacin to complete antibiotic course.  Remainder of her medications been unchanged.  Follow-up outpatient PCP.  Discharge Instructions  Discharge Instructions     Diet - low sodium heart healthy   Complete by: As directed    Increase activity slowly   Complete by: As directed       Allergies as of 03/18/2021       Reactions   Codeine Nausea Only, Other (See Comments)   Hallucinations        Medication List     STOP taking these medications    methylPREDNISolone 4 MG Tbpk tablet Commonly known as: MEDROL DOSEPAK       TAKE these medications    atorvastatin 80 MG tablet Commonly known as: LIPITOR TAKE 1 TABLET (80 MG) BY MOUTH AT BEDTIME   CALTRATE 600+D PO Take by mouth daily.   chlorthalidone 25 MG tablet Commonly known as: HYGROTON Take 1 tablet (25 mg total) by mouth daily. What changed: additional instructions   ciprofloxacin 500 MG tablet Commonly known as: Cipro Take 1 tablet (500 mg total) by mouth 2 (two) times daily for 7 days.   clopidogrel 75 MG tablet Commonly known as: PLAVIX Take 75 mg by mouth daily. Pt taking QHS   DULoxetine 30 MG capsule Commonly known as: CYMBALTA Take 30 mg by mouth at bedtime.   empagliflozin 10 MG Tabs tablet Commonly known as: Jardiance Take 1 tablet (10 mg total) by mouth daily before breakfast.   gabapentin 100 MG capsule Commonly known as: NEURONTIN Take 100 mg by mouth at bedtime.  lisinopril 10 MG tablet Commonly known as: ZESTRIL Take 1 tablet (10 mg total) by mouth daily. What changed: additional instructions   Magnesium 100 MG Tabs Take by mouth.   metFORMIN 1000 MG tablet Commonly known as: GLUCOPHAGE Take 0.5 tablets (500 mg total) by mouth 2 (two) times daily  with a meal.   mirabegron ER 25 MG Tb24 tablet Commonly known as: MYRBETRIQ Take 1 tablet (25 mg total) by mouth daily.   multivitamin tablet Take 1 tablet by mouth daily. Pt taking QHS   oxyCODONE 5 MG immediate release tablet Commonly known as: Roxicodone Take 1 tablet (5 mg total) by mouth every 8 (eight) hours as needed.   Potassium 99 MG Tabs Take by mouth.   tamsulosin 0.4 MG Caps capsule Commonly known as: FLOMAX Take 1 capsule (0.4 mg total) by mouth daily.   TURMERIC PO Take 1 capsule by mouth at bedtime.   vitamin B-12 100 MCG tablet Commonly known as: CYANOCOBALAMIN Take by mouth daily. Pt taking 1000 mcg daily QHS   Vitamin D 50 MCG (2000 UT) tablet Pt taking daily QHS   vitamin E 45 MG (100 UNITS) capsule Take by mouth daily. Pt taking QHS        Allergies  Allergen Reactions   Codeine Nausea Only and Other (See Comments)    Hallucinations    Consultations: None   Procedures/Studies: CT Renal Stone Study  Result Date: 03/17/2021 CLINICAL DATA:  Right flank pain, hematuria EXAM: CT ABDOMEN AND PELVIS WITHOUT CONTRAST TECHNIQUE: Multidetector CT imaging of the abdomen and pelvis was performed following the standard protocol without IV contrast. RADIATION DOSE REDUCTION: This exam was performed according to the departmental dose-optimization program which includes automated exposure control, adjustment of the mA and/or kV according to patient size and/or use of iterative reconstruction technique. COMPARISON:  None. FINDINGS: Lower chest: Lung bases are clear. Hepatobiliary: Unenhanced liver is unremarkable. Gallbladder is notable for mild layering sludge, without associated inflammatory changes. No intrahepatic or extrahepatic ductal dilatation. Pancreas: Within normal limits. Spleen: Within normal limits. Adrenals/Urinary Tract: Adrenal glands are within normal limits. Kidneys are within normal limits. No renal calculi or hydronephrosis. Mild periureteral  stranding along the mid/distal right ureter (series 2/image 59). No ureteral or bladder calculi. Mild perivesical stranding along the bladder neck/proximal urethra (series 2/image 86). Stomach/Bowel: Status post gastric bypass with patent gastrojejunostomy. No evidence of bowel obstruction. Normal appendix (series 2/image 58). No colonic wall thickening or inflammatory changes. Vascular/Lymphatic: No evidence of abdominal aortic aneurysm. Atherosclerotic calcifications of the abdominal aorta and branch vessels. To lymph nodes Reproductive: Status post hysterectomy. No adnexal masses. Other: No abdominopelvic ascites. Musculoskeletal: Degenerative changes of the visualized thoracolumbar spine. Mild anterior wedging at T12 and L1, likely chronic. IMPRESSION: Mild right periureteral stranding and mild perivesical stranding along the bladder neck/proximal urethra. No ureteral or bladder calculi. Differential considerations include recent passage of a right ureteral calculus versus cystitis with ascending infection (favored). Electronically Signed   By: Julian Hy M.D.   On: 03/17/2021 19:41      Subjective: Seen examined the day of discharge.  Stable no distress.  Abdominal pain resolved.  Urinating without difficulty.  Afebrile.  Stable for discharge home.  Discharge Exam: Vitals:   03/18/21 0357 03/18/21 0737  BP: (!) 103/54 118/69  Pulse: 71 70  Resp: 16   Temp: 98.4 F (36.9 C) 98.7 F (37.1 C)  SpO2: 98% 97%   Vitals:   03/17/21 2030 03/17/21 2133 03/18/21 0357 03/18/21 [id number]  BP: (!) 106/91 (!) 153/64 (!) 103/54 118/69  Pulse: 82 90 71 70  Resp: 18 16 16    Temp: 98.8 F (37.1 C) 98.3 F (36.8 C) 98.4 F (36.9 C) 98.7 F (37.1 C)  TempSrc: Oral Oral  Oral  SpO2: 95% 97% 98% 97%  Weight:      Height:        General: Pt is alert, awake, not in acute distress Cardiovascular: RRR, S1/S2 +, no rubs, no gallops Respiratory: CTA bilaterally, no wheezing, no rhonchi Abdominal:  Soft, NT, ND, bowel sounds + Extremities: no edema, no cyanosis    The results of significant diagnostics from this hospitalization (including imaging, microbiology, ancillary and laboratory) are listed below for reference.     Microbiology: Recent Results (from the past 240 hour(s))  Resp Panel by RT-PCR (Flu A&B, Covid) Nasopharyngeal Swab     Status: None   Collection Time: 03/17/21  8:07 PM   Specimen: Nasopharyngeal Swab; Nasopharyngeal(NP) swabs in vial transport medium  Result Value Ref Range Status   SARS Coronavirus 2 by RT PCR NEGATIVE NEGATIVE Final    Comment: (NOTE) SARS-CoV-2 target nucleic acids are NOT DETECTED.  The SARS-CoV-2 RNA is generally detectable in upper respiratory specimens during the acute phase of infection. The lowest concentration of SARS-CoV-2 viral copies this assay can detect is 138 copies/mL. A negative result does not preclude SARS-Cov-2 infection and should not be used as the sole basis for treatment or other patient management decisions. A negative result may occur with  improper specimen collection/handling, submission of specimen other than nasopharyngeal swab, presence of viral mutation(s) within the areas targeted by this assay, and inadequate number of viral copies(<138 copies/mL). A negative result must be combined with clinical observations, patient history, and epidemiological information. The expected result is Negative.  Fact Sheet for Patients:  EntrepreneurPulse.com.au  Fact Sheet for Healthcare Providers:  IncredibleEmployment.be  This test is no t yet approved or cleared by the [place] FDA and  has been authorized for detection and/or diagnosis of SARS-CoV-2 by FDA under an Emergency Use Authorization (EUA). This EUA will remain  in effect (meaning this test can be used) for the duration of the COVID-19 declaration under Section 564(b)(1) of the Act, 21 U.S.C.section 360bbb-3(b)(1),  unless the authorization is terminated  or revoked sooner.       Influenza A by PCR NEGATIVE NEGATIVE Final   Influenza B by PCR NEGATIVE NEGATIVE Final    Comment: (NOTE) The Xpert Xpress SARS-CoV-2/FLU/RSV plus assay is intended as an aid in the diagnosis of influenza from Nasopharyngeal swab specimens and should not be used as a sole basis for treatment. Nasal washings and aspirates are unacceptable for Xpert Xpress SARS-CoV-2/FLU/RSV testing.  Fact Sheet for Patients: EntrepreneurPulse.com.au  Fact Sheet for Healthcare Providers: IncredibleEmployment.be  This test is not yet approved or cleared by the [place] FDA and has been authorized for detection and/or diagnosis of SARS-CoV-2 by FDA under an Emergency Use Authorization (EUA). This EUA will remain in effect (meaning this test can be used) for the duration of the COVID-19 declaration under Section 564(b)(1) of the Act, 21 U.S.C. section 360bbb-3(b)(1), unless the authorization is terminated or revoked.  Performed at [facility], [address] [place], [place] [address]   Blood culture (routine x 2)     Status: None (Preliminary result)   Collection Time: 03/17/21  8:07 PM   Specimen: BLOOD  Result Value Ref Range Status   Specimen Description BLOOD LEFT HAND  Final   Special Requests   Final    BOTTLES DRAWN AEROBIC AND ANAEROBIC Blood Culture adequate volume   Culture   Final    NO GROWTH < 12 HOURS Performed at [facility], [address] [place], [place] [address]    Report Status PENDING  Incomplete  Blood culture (routine x 2)     Status: None (Preliminary result)   Collection Time: 03/17/21  8:08 PM   Specimen: BLOOD  Result Value Ref Range Status   Specimen Description BLOOD RIGHT FOREARM  Final   Special Requests   Final    BOTTLES DRAWN AEROBIC AND ANAEROBIC Blood Culture adequate volume   Culture   Final    NO GROWTH < 12  HOURS Performed at [facility], [address] [place], [place] [address]    Report Status PENDING  Incomplete     Labs: BNP (last 3 results) No results for input(s): BNP in the last 8760 hours. Basic Metabolic Panel: Recent Labs  Lab 03/17/21 1811 03/18/21 0359  NA 134* 137  K 3.6 3.9  CL 97* 100  CO2 27 29  GLUCOSE 166* 127*  BUN 15 13  CREATININE 0.76 0.58  CALCIUM 9.5 9.2   Liver Function Tests: Recent Labs  Lab 03/17/21 2007  AST 18  ALT 11  ALKPHOS 61  BILITOT 0.6  PROT 6.9  ALBUMIN 3.6   Recent Labs  Lab 03/17/21 2007  LIPASE 36   No results for input(s): AMMONIA in the last 168 hours. CBC: Recent Labs  Lab 03/17/21 1811 03/18/21 0359  WBC 15.7* 13.2*  HGB 12.4 12.5  HCT 39.2 39.6  MCV 84.7 84.1  PLT 611* 584*   Cardiac Enzymes: No results for input(s): CKTOTAL, CKMB, CKMBINDEX, TROPONINI in the last 168 hours. BNP: Invalid input(s): POCBNP CBG: Recent Labs  Lab 03/17/21 2136 03/18/21 0736  GLUCAP 243* 125*   D-Dimer No results for input(s): DDIMER in the last 72 hours. Hgb A1c No results for input(s): HGBA1C in the last 72 hours. Lipid Profile No results for input(s): CHOL, HDL, LDLCALC, TRIG, CHOLHDL, LDLDIRECT in the last 72 hours. Thyroid function studies No results for input(s): TSH, T4TOTAL, T3FREE, THYROIDAB in the last 72 hours.  Invalid input(s): FREET3 Anemia work up No results for input(s): VITAMINB12, FOLATE, FERRITIN, TIBC, IRON, RETICCTPCT in the last 72 hours. Urinalysis    Component Value Date/Time   COLORURINE YELLOW (A) 03/17/2021 1811   APPEARANCEUR CLOUDY (A) 03/17/2021 1811   LABSPEC 1.024 03/17/2021 1811   PHURINE 5.0 03/17/2021 1811   GLUCOSEU >=500 (A) 03/17/2021 1811   HGBUR LARGE (A) 03/17/2021 1811   BILIRUBINUR NEGATIVE 03/17/2021 1811   BILIRUBINUR neg 03/15/2021 0914   KETONESUR 5 (A) 03/17/2021 1811   PROTEINUR 100 (A) 03/17/2021 1811   UROBILINOGEN 0.2 03/15/2021 0914   NITRITE  NEGATIVE 03/17/2021 1811   LEUKOCYTESUR MODERATE (A) 03/17/2021 1811   Sepsis Labs Invalid input(s): PROCALCITONIN,  WBC,  LACTICIDVEN Microbiology Recent Results (from the past 240 hour(s))  Resp Panel by RT-PCR (Flu A&B, Covid) Nasopharyngeal Swab     Status: None   Collection Time: 03/17/21  8:07 PM   Specimen: Nasopharyngeal Swab; Nasopharyngeal(NP) swabs in vial transport medium  Result Value Ref Range Status   SARS Coronavirus 2 by RT PCR NEGATIVE NEGATIVE Final    Comment: (NOTE) SARS-CoV-2 target nucleic acids are NOT DETECTED.  The SARS-CoV-2 RNA is generally detectable in upper respiratory specimens during the acute phase of infection. The  lowest concentration of SARS-CoV-2 viral copies this assay can detect is 138 copies/mL. A negative result does not preclude SARS-Cov-2 infection and should not be used as the sole basis for treatment or other patient management decisions. A negative result may occur with  improper specimen collection/handling, submission of specimen other than nasopharyngeal swab, presence of viral mutation(s) within the areas targeted by this assay, and inadequate number of viral copies(<138 copies/mL). A negative result must be combined with clinical observations, patient history, and epidemiological information. The expected result is Negative.  Fact Sheet for Patients:  EntrepreneurPulse.com.au  Fact Sheet for Healthcare Providers:  IncredibleEmployment.be  This test is no t yet approved or cleared by the [place] FDA and  has been authorized for detection and/or diagnosis of SARS-CoV-2 by FDA under an Emergency Use Authorization (EUA). This EUA will remain  in effect (meaning this test can be used) for the duration of the COVID-19 declaration under Section 564(b)(1) of the Act, 21 U.S.C.section 360bbb-3(b)(1), unless the authorization is terminated  or revoked sooner.       Influenza A by PCR  NEGATIVE NEGATIVE Final   Influenza B by PCR NEGATIVE NEGATIVE Final    Comment: (NOTE) The Xpert Xpress SARS-CoV-2/FLU/RSV plus assay is intended as an aid in the diagnosis of influenza from Nasopharyngeal swab specimens and should not be used as a sole basis for treatment. Nasal washings and aspirates are unacceptable for Xpert Xpress SARS-CoV-2/FLU/RSV testing.  Fact Sheet for Patients: EntrepreneurPulse.com.au  Fact Sheet for Healthcare Providers: IncredibleEmployment.be  This test is not yet approved or cleared by the [place] FDA and has been authorized for detection and/or diagnosis of SARS-CoV-2 by FDA under an Emergency Use Authorization (EUA). This EUA will remain in effect (meaning this test can be used) for the duration of the COVID-19 declaration under Section 564(b)(1) of the Act, 21 U.S.C. section 360bbb-3(b)(1), unless the authorization is terminated or revoked.  Performed at [facility], [address] [place], [place] [address]   Blood culture (routine x 2)     Status: None (Preliminary result)   Collection Time: 03/17/21  8:07 PM   Specimen: BLOOD  Result Value Ref Range Status   Specimen Description BLOOD LEFT HAND  Final   Special Requests   Final    BOTTLES DRAWN AEROBIC AND ANAEROBIC Blood Culture adequate volume   Culture   Final    NO GROWTH < 12 HOURS Performed at [facility], [address], [place], [place] [address]    Report Status PENDING  Incomplete  Blood culture (routine x 2)     Status: None (Preliminary result)   Collection Time: 03/17/21  8:08 PM   Specimen: BLOOD  Result Value Ref Range Status   Specimen Description BLOOD RIGHT FOREARM  Final   Special Requests   Final    BOTTLES DRAWN AEROBIC AND ANAEROBIC Blood Culture adequate volume   Culture   Final    NO GROWTH < 12 HOURS Performed at [facility], [address], [place], [place] [address]    Report  Status PENDING  Incomplete     Time coordinating discharge: Over 30 minutes  SIGNED:   Sidney Ace, MD  [facility] 03/18/2021, 2:15 PM Pager   If 7PM-7AM, please contact night-coverage

## 2021-03-18 NOTE — Assessment & Plan Note (Signed)
-   Presumed secondary to pyelonephritis, treat as above

## 2021-03-18 NOTE — TOC CM/SW Note (Signed)
Patient has orders to discharge home today. Chart reviewed. PCP is Margarita Mail, DO. On room air. No discharge wound care needs. No TOC needs identified. CSW signing off.  Charlynn Court, CSW [phone]

## 2021-03-18 NOTE — Telephone Encounter (Signed)
Pt states she went to the ED as instructed.  They kept her overnight.  She had UTI, passed a stone, and reports being helped very much.  She is feeling better.  Her white blood count went to 13. Pt wants to know if she should keep her CT scan scheduled next week?

## 2021-03-18 NOTE — Progress Notes (Signed)
Discharge instructions reviewed with the patient. Patient sent out via wheelchair to the ER entrance where she will be driving herself home

## 2021-03-20 LAB — URINE CULTURE: Culture: >=100000 — AB

## 2021-03-21 ENCOUNTER — Ambulatory Visit (HOSPITAL_COMMUNITY): Payer: Medicare Other

## 2021-03-21 ENCOUNTER — Ambulatory Visit: Payer: Self-pay | Admitting: *Deleted

## 2021-03-21 DIAGNOSIS — R110 Nausea: Secondary | ICD-10-CM

## 2021-03-21 MED ORDER — ONDANSETRON HCL 4 MG PO TABS: 4.0000 mg | ORAL_TABLET | Freq: Three times a day (TID) | ORAL | 0 refills | Status: DC | PRN

## 2021-03-21 NOTE — Telephone Encounter (Signed)
Pt.notified

## 2021-03-21 NOTE — Addendum Note (Signed)
Addended by: Teodora Medici on: 03/21/2021 10:42 AM   Modules accepted: Orders

## 2021-03-21 NOTE — Telephone Encounter (Addendum)
Reason for Disposition  [1] Caller has URGENT medicine question about med that PCP or specialist prescribed AND [2] triager unable to answer question  Answer Assessment - Initial Assessment Questions 1. NAME of MEDICATION: "What medicine are you calling about?"     Ciprofloxacin I got on Friday is making me nauseas.   I have a UTI.  I was seen in the ED Friday.    2. QUESTION: "What is your question?" (e.g., double dose of medicine, side effect)     It's making me nauseas.    My husband  had some Zofran which helped.   Can I get more Zofran?   I have 4 more days of the antibiotic and it really helps with the nausea.  3. PRESCRIBING HCP: "Who prescribed it?" Reason: if prescribed by specialist, call should be referred to that group.     Dr. At the hospital.   I went to the ED 4. SYMPTOMS: "Do you have any symptoms?"     nausea 5. SEVERITY: If symptoms are present, ask "Are they mild, moderate or severe?"     Moderate but the Zofran really helps 6. PREGNANCY:  "Is there any chance that you are pregnant?" "When was your last menstrual period?"     N/A  Protocols used: Medication Question Call-A-AH   Zofran sent to pharmacy.

## 2021-03-22 LAB — CULTURE, BLOOD (ROUTINE X 2)
Culture: NO GROWTH
Culture: NO GROWTH
Special Requests: ADEQUATE
Special Requests: ADEQUATE

## 2021-04-08 ENCOUNTER — Ambulatory Visit: Payer: Self-pay

## 2021-04-08 NOTE — Telephone Encounter (Signed)
Summary: Lower back pain - seeking nurse advice  ? Best contact: [phone]  ? ?Pt lower back pain on the left side and she cannot walk. Wants to speak to nurse, please advise   ?  ?  Left message to call back about symptoms. ?

## 2021-04-08 NOTE — Telephone Encounter (Signed)
?  Chief Complaint: back pain ?Symptoms: L lower back pain 10/10, constant, unable to walk around good  ?Frequency: since Wednesday ?Pertinent Negatives: Patient denies weakness/ numbness or tingling ?Disposition: [] ED /[] Urgent Care (no appt availability in office) / [x] Appointment(In office/virtual)/ []  [facility]/ [] Home Care/ [] Refused Recommended Disposition /[] [facility]/ []  Follow-up with PCP ?Additional Notes: Pt states she has used heat and taken Tylenol and Ibuprofen with no relief. Scheduled for first available and advised if pain gets any severe can go to UC before Monday.  ? ? ?Summary: Lower back pain - seeking nurse advice  ? Best contact: [phone]  ? ?Pt lower back pain on the left side and she cannot walk. Wants to speak to nurse, please advise  ?  ? ?Reason for Disposition ? [1] Pain radiates into the thigh or further down the leg AND [2] both legs ? ?Answer Assessment - Initial Assessment Questions ?1. ONSET: "When did the pain begin?"  ?    Wednesday  ?2. LOCATION: "Where does it hurt?" (upper, mid or lower back) ?    L Low back pain  ?3. SEVERITY: "How bad is the pain?"  (e.g., Scale 1-10; mild, moderate, or severe) ?  - MILD (1-3): doesn't interfere with normal activities  ?  - MODERATE (4-7): interferes with normal activities or awakens from sleep  ?  - SEVERE (8-10): excruciating pain, unable to do any normal activities  ?    10 ?4. PATTERN: "Is the pain constant?" (e.g., yes, no; constant, intermittent)  ?    constant ?5. RADIATION: "Does the pain shoot into your legs or elsewhere?" ?    Into legs at times ?6. CAUSE:  "What do you think is causing the back pain?"  ?    Mowed the y ?7. BACK OVERUSE:  "Any recent lifting of heavy objects, strenuous work or exercise?" ?    No ?8. MEDICATIONS: "What have you taken so far for the pain?" (e.g., nothing, acetaminophen, NSAIDS) ?    Tylenol and ibuprofen today ?9. NEUROLOGIC SYMPTOMS: "Do you have any weakness, numbness,  or problems with bowel/bladder control?" ?    No ?10. OTHER SYMPTOMS: "Do you have any other symptoms?" (e.g., fever, abdominal pain, burning with urination, blood in urine) ?      No ? ?Protocols used: Back Pain-A-AH ? ?

## 2021-04-08 NOTE — Telephone Encounter (Signed)
FYI

## 2021-04-08 NOTE — Telephone Encounter (Signed)
Second attempt to reach pt. Left message to call back. 

## 2021-04-11 ENCOUNTER — Ambulatory Visit (INDEPENDENT_AMBULATORY_CARE_PROVIDER_SITE_OTHER): Payer: Medicare Other | Admitting: Nurse Practitioner

## 2021-04-11 ENCOUNTER — Other Ambulatory Visit: Payer: Self-pay

## 2021-04-11 ENCOUNTER — Encounter: Payer: Self-pay | Admitting: Nurse Practitioner

## 2021-04-11 VITALS — BP 118/72 | HR 76 | Temp 98.1°F | Resp 14 | Ht 64.0 in | Wt 165.2 lb

## 2021-04-11 DIAGNOSIS — M545 Low back pain, unspecified: Secondary | ICD-10-CM | POA: Diagnosis not present

## 2021-04-11 DIAGNOSIS — R8290 Unspecified abnormal findings in urine: Secondary | ICD-10-CM | POA: Diagnosis not present

## 2021-04-11 LAB — POCT URINALYSIS DIPSTICK
Bilirubin, UA: NEGATIVE
Blood, UA: NEGATIVE
Glucose, UA: POSITIVE — AB
Nitrite, UA: NEGATIVE
Protein, UA: POSITIVE — AB
Spec Grav, UA: 1.02 (ref 1.010–1.025)
Urobilinogen, UA: 0.2 E.U./dL
pH, UA: 5 (ref 5.0–8.0)

## 2021-04-11 MED ORDER — PREDNISONE 10 MG (21) PO TBPK: ORAL_TABLET | ORAL | 0 refills | Status: DC

## 2021-04-11 MED ORDER — NAPROXEN 500 MG PO TABS: 500.0000 mg | ORAL_TABLET | Freq: Two times a day (BID) | ORAL | 0 refills | Status: DC

## 2021-04-11 MED ORDER — TIZANIDINE HCL 4 MG PO TABS: 4.0000 mg | ORAL_TABLET | Freq: Four times a day (QID) | ORAL | 0 refills | Status: DC | PRN

## 2021-04-11 NOTE — Progress Notes (Signed)
? ?BP 118/72   Pulse 76   Temp 98.1 ?F (36.7 ?C) (Oral)   Resp 14   Ht 5\' 4"  (1.626 m)   Wt 165 lb 3.2 oz (74.9 kg)   SpO2 98%   BMI 28.36 kg/m?   ? ?Subjective:  ? ? Patient ID: , female    DOB: Dec 28, 1950, 71 y.o.   MRN: [id number] ? ?HPI: ?Sandy Salinas is a 71 y.o. female, here alone ? ?Chief Complaint  ?Patient presents with  ? Back Pain  ? Sciatica  ?  bilateral  ? ?Left lower back pain:  She says that in early February she had pain on the right side.  She says she was treated like it was sciatica.  She says the pain continued so she went to the emergency room and diagnosed with pyelonephritis.  She says she completed all the antibiotics from that episode.  She says this time the pain is on the left and it started about a week ago. No trauma, no fever, no chills, no dysuria. She denies any radiation of pain, no numbness or tingling down legs. She denies any incontinence of urine or bowel.  ? ?Relevant past medical, surgical, family and social history reviewed and updated as indicated. Interim medical history since our last visit reviewed. ?Allergies and medications reviewed and updated. ? ?Review of Systems ? ?Constitutional: Negative for fever or weight change.  ?Respiratory: Negative for cough and shortness of breath.   ?Cardiovascular: Negative for chest pain or palpitations.  ?Gastrointestinal: Negative for abdominal pain, no bowel changes.  ?Musculoskeletal: Positive for gait problem or joint swelling.  ?Skin: Negative for rash.  ?Neurological: Negative for dizziness or headache.  ?No other specific complaints in a complete review of systems (except as listed in HPI above).  ? ?   ?Objective:  ?  ?BP 118/72   Pulse 76   Temp 98.1 ?F (36.7 ?C) (Oral)   Resp 14   Ht 5\' 4"  (1.626 m)   Wt 165 lb 3.2 oz (74.9 kg)   SpO2 98%   BMI 28.36 kg/m?   ?Wt Readings from Last 3 Encounters:  ?04/11/21 165 lb 3.2 oz (74.9 kg)  ?03/17/21 167 lb 5.3 oz (75.9 kg)  ?03/15/21 167 lb 6.4 oz  (75.9 kg)  ?  ?Physical Exam ? ?Constitutional: Patient appears well-developed and well-nourished. Obese  No distress.  ?HEENT: head atraumatic, normocephalic, pupils equal and reactive to light,  neck supple ?Cardiovascular: Normal rate, regular rhythm and normal heart sounds.  No murmur heard. No BLE edema. ?Pulmonary/Chest: Effort normal and breath sounds normal. No respiratory distress. ?Abdominal: Soft.  There is no tenderness. No CVA tenderness ?MSK: tenderness in left lowe back ?Psychiatric: Patient has a normal mood and affect. behavior is normal. Judgment and thought content normal.  ? ?Results for orders placed or performed in visit on 04/11/21  ?POCT urinalysis dipstick  ?Result Value Ref Range  ? Color, UA yellow   ? Clarity, UA clear   ? Glucose, UA Positive (A) Negative  ? Bilirubin, UA negative   ? Ketones, UA neagtive   ? Spec Grav, UA 1.020 1.010 - 1.025  ? Blood, UA negative   ? pH, UA 5.0 5.0 - 8.0  ? Protein, UA Positive (A) Negative  ? Urobilinogen, UA 0.2 0.2 or 1.0 E.U./dL  ? Nitrite, UA negative   ? Leukocytes, UA Small (1+) (A) Negative  ? Appearance clear   ? Odor none   ? ?   ?  Assessment & Plan:  ? ?1. Acute left-sided low back pain without sciatica ? ?- POCT urinalysis dipstick ?- Urine Culture ?- tiZANidine (ZANAFLEX) 4 MG tablet; Take 1 tablet (4 mg total) by mouth every 6 (six) hours as needed for muscle spasms.  Dispense: 30 tablet; Refill: 0 ?- naproxen (NAPROSYN) 500 MG tablet; Take 1 tablet (500 mg total) by mouth 2 (two) times daily with a meal.  Dispense: 20 tablet; Refill: 0 ?- predniSONE (STERAPRED UNI-PAK 21 TAB) 10 MG (21) TBPK tablet; Take as directed on package.  (60 mg po on day 1, 50 mg po on day 2...)  Dispense: 21 tablet; Refill: 0 ? ?2. Abnormal urine ? ?- Urine Culture  ? ?Follow up plan: ?Return if symptoms worsen or fail to improve. ? ? ? ? ? ?

## 2021-04-13 LAB — URINE CULTURE
MICRO NUMBER:: 13092527
SPECIMEN QUALITY:: ADEQUATE

## 2021-04-14 ENCOUNTER — Encounter: Payer: Self-pay | Admitting: Nurse Practitioner

## 2021-04-14 ENCOUNTER — Ambulatory Visit (INDEPENDENT_AMBULATORY_CARE_PROVIDER_SITE_OTHER): Payer: Medicare Other | Admitting: Emergency Medicine

## 2021-04-14 DIAGNOSIS — R350 Frequency of micturition: Secondary | ICD-10-CM

## 2021-04-14 LAB — POCT URINALYSIS DIPSTICK
Bilirubin, UA: NEGATIVE
Glucose, UA: POSITIVE — AB
Ketones, UA: NEGATIVE
Nitrite, UA: NEGATIVE
Protein, UA: POSITIVE — AB
Spec Grav, UA: 1.02 (ref 1.010–1.025)
Urobilinogen, UA: 0.2 E.U./dL
pH, UA: 5 (ref 5.0–8.0)

## 2021-04-14 NOTE — Progress Notes (Signed)
p 

## 2021-04-15 LAB — URINE CULTURE
MICRO NUMBER:: 13111016
SPECIMEN QUALITY:: ADEQUATE

## 2021-04-16 ENCOUNTER — Other Ambulatory Visit: Payer: Self-pay | Admitting: Nurse Practitioner

## 2021-04-16 DIAGNOSIS — Z87448 Personal history of other diseases of urinary system: Secondary | ICD-10-CM

## 2021-04-16 DIAGNOSIS — R350 Frequency of micturition: Secondary | ICD-10-CM

## 2021-04-19 ENCOUNTER — Encounter: Payer: Self-pay | Admitting: Nurse Practitioner

## 2021-04-20 ENCOUNTER — Other Ambulatory Visit: Payer: Self-pay | Admitting: Nurse Practitioner

## 2021-04-20 DIAGNOSIS — B379 Candidiasis, unspecified: Secondary | ICD-10-CM

## 2021-04-20 MED ORDER — FLUCONAZOLE 150 MG PO TABS: 150.0000 mg | ORAL_TABLET | Freq: Once | ORAL | 0 refills | Status: AC

## 2021-04-21 ENCOUNTER — Ambulatory Visit: Payer: Medicare Other | Admitting: Family Medicine

## 2021-04-25 ENCOUNTER — Ambulatory Visit: Payer: Medicare Other | Admitting: Family Medicine

## 2021-04-26 ENCOUNTER — Other Ambulatory Visit: Payer: Self-pay | Admitting: Family Medicine

## 2021-04-26 ENCOUNTER — Ambulatory Visit (INDEPENDENT_AMBULATORY_CARE_PROVIDER_SITE_OTHER): Payer: Medicare Other | Admitting: Family Medicine

## 2021-04-26 ENCOUNTER — Encounter: Payer: Self-pay | Admitting: Family Medicine

## 2021-04-26 VITALS — BP 128/80 | HR 68 | Temp 98.1°F | Resp 16 | Ht 64.0 in | Wt 165.4 lb

## 2021-04-26 DIAGNOSIS — F0280 Dementia in other diseases classified elsewhere without behavioral disturbance: Secondary | ICD-10-CM

## 2021-04-26 DIAGNOSIS — N3941 Urge incontinence: Secondary | ICD-10-CM

## 2021-04-26 DIAGNOSIS — M5416 Radiculopathy, lumbar region: Secondary | ICD-10-CM

## 2021-04-26 DIAGNOSIS — L4050 Arthropathic psoriasis, unspecified: Secondary | ICD-10-CM

## 2021-04-26 DIAGNOSIS — R350 Frequency of micturition: Secondary | ICD-10-CM | POA: Diagnosis not present

## 2021-04-26 DIAGNOSIS — M8580 Other specified disorders of bone density and structure, unspecified site: Secondary | ICD-10-CM | POA: Diagnosis not present

## 2021-04-26 DIAGNOSIS — E1169 Type 2 diabetes mellitus with other specified complication: Secondary | ICD-10-CM

## 2021-04-26 DIAGNOSIS — I10 Essential (primary) hypertension: Secondary | ICD-10-CM

## 2021-04-26 DIAGNOSIS — E669 Obesity, unspecified: Secondary | ICD-10-CM | POA: Diagnosis not present

## 2021-04-26 DIAGNOSIS — E782 Mixed hyperlipidemia: Secondary | ICD-10-CM

## 2021-04-26 DIAGNOSIS — G309 Alzheimer's disease, unspecified: Secondary | ICD-10-CM | POA: Diagnosis not present

## 2021-04-26 NOTE — Progress Notes (Signed)
? ?  SUBJECTIVE:  ? ?CHIEF COMPLAINT / HPI:  ? ?Hypertension: ?- Medications: chlorthalidone, lisinopril ?- Compliance: good ?- Checking BP at home: yes, unsure  ?- Denies any SOB, CP, vision changes, LE edema, medication SEs, or symptoms of hypotension ? ?Diabetes, Type 2 ?- Last A1c 8.2 01/2021 ?- Medications: metformin, jardiance ?- Compliance: unable to tolerate jardiance due to yeast infections, UTI, worsened urinary frequency. ?- Checking BG at home: yes, 80-250. Not in 80s often.  ?- Eye exam: UTD ?- Foot exam: UTD ?- Statin: yes ?- PNA vaccine: UTD ? ?HLD ?- medications: lipitor ?- compliance: good ?- medication SEs: none ? 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 Had sleep deprived EEG and MRI which showed ischemic disease. Placed on plavix by Dr. Manuella Ghazi at [facility]. +FH of dementia in mother and multiple aunts/uncles.  ? ?H/o Osteoporosis - last DEXA 09/2020, osteopenic. T score -1.9. On calcium and vit D supplementation, last vit D level wnl 02/2021. ? ?Psoriatic arthritis - on cymbalta. Follows with rheum with last appt and injection 08/2020. ? ?Urge incontinence - on myrbetriq. 4/6 with urology.  ? ?Yeast infection - resolved with diflucan and discontinuation of jardiance.  ? ?OBJECTIVE:  ? ?BP 128/80   Pulse 68   Temp 98.1 ?F (36.7 ?C)   Resp 16   Ht 5\' 4"  (1.626 m)   Wt 165 lb 6.4 oz (75 kg)   SpO2 98%   BMI 28.39 kg/m?   ?Gen: well appearing, in NAD ?Card: RRR ?Lungs: CTAB ?Ext: WWP, no edema ? ? ?ASSESSMENT/PLAN:  ? ?Hypertension ?Doing well on current regimen, no changes made today. ? ?Diabetes mellitus type 2 in obese [facility]) ?Recheck a1c and adjust regimen as indicated. Intolerant to jardiance, added to allergy list. If remains above goal, consider starting rybelsus. F/u in 3 months  ? ?Psoriatic arthritis ([place]) ?Continue cymbalta. Continue to follow with Rheum. ? ?Hyperlipidemia ?Tolerant of statin, continue. Recheck labs. ? ?Urinary  frequency ?Improved since d/c jardiance. Has upcoming appt with urology for urge incontinence. ? ?Osteopenia ?Continue calcium and vit D ? ?Lumbar radiculopathy, acute ?Improved s/p steroids.  ?  ? ? ?Myles Gip, DO ?

## 2021-04-26 NOTE — Assessment & Plan Note (Signed)
Improved s/p steroids.  ?

## 2021-04-26 NOTE — Assessment & Plan Note (Signed)
Continue calcium and vit D.  °

## 2021-04-26 NOTE — Assessment & Plan Note (Signed)
Doing well on current regimen, no changes made today. 

## 2021-04-26 NOTE — Assessment & Plan Note (Signed)
Continue cymbalta. Continue to follow with Rheum. ?

## 2021-04-26 NOTE — Assessment & Plan Note (Signed)
Improved since d/c jardiance. Has upcoming appt with urology for urge incontinence. ?

## 2021-04-26 NOTE — Assessment & Plan Note (Signed)
Tolerant of statin, continue. Recheck labs. 

## 2021-04-26 NOTE — Assessment & Plan Note (Signed)
Recheck a1c and adjust regimen as indicated. Intolerant to jardiance, added to allergy list. If remains above goal, consider starting rybelsus. F/u in 3 months  ?

## 2021-04-27 ENCOUNTER — Encounter: Payer: Self-pay | Admitting: Family Medicine

## 2021-04-27 ENCOUNTER — Telehealth: Payer: Self-pay | Admitting: Internal Medicine

## 2021-04-27 ENCOUNTER — Other Ambulatory Visit: Payer: Self-pay | Admitting: Family Medicine

## 2021-04-27 LAB — HEMOGLOBIN A1C
Hgb A1c MFr Bld: 8.5 % of total Hgb — ABNORMAL HIGH (ref <5.7)
Mean Plasma Glucose: 197 mg/dL
eAG (mmol/L): 10.9 mmol/L

## 2021-04-27 LAB — LIPID PANEL
Cholesterol: 126 mg/dL (ref <200)
HDL: 33 mg/dL — ABNORMAL LOW (ref >=50)
LDL Cholesterol (Calc): 67 mg/dL (calc)
Non-HDL Cholesterol (Calc): 93 mg/dL (calc) (ref <130)
Total CHOL/HDL Ratio: 3.8 (calc) (ref <5.0)
Triglycerides: 184 mg/dL — ABNORMAL HIGH (ref <150)

## 2021-04-27 MED ORDER — RYBELSUS 7 MG PO TABS: 7.0000 mg | ORAL_TABLET | Freq: Every day | ORAL | 0 refills | Status: DC

## 2021-04-27 NOTE — Telephone Encounter (Signed)
Spoke to pt informed her it has already been sent to [facility]. Pt agreed with [facility].  ?

## 2021-04-27 NOTE — Telephone Encounter (Signed)
Pt returned call says does want Rybelsus sent in to [facility] in [place] .  ?[id number] - [place], [place] - [address] AT [facility] [address] [facility] Phone:  [phone]  ?Fax:  [phone]  ?  ? ?

## 2021-04-28 ENCOUNTER — Encounter: Payer: Self-pay | Admitting: Family Medicine

## 2021-04-29 ENCOUNTER — Telehealth: Payer: Self-pay

## 2021-04-29 NOTE — Telephone Encounter (Signed)
Was not able to reach anyone.  Its not on current or old med list and was not listed on prior auth form.  So no has not taken ?

## 2021-04-29 NOTE — Telephone Encounter (Signed)
Copied from CRM [id number]. Topic: General - Other >> Apr 29, 2021  3:01 PM Marylen Ponto wrote: Reason for CRM: Candace with [employer] asked if patient has tried Bydureon. Cb# [id number] Reference# [id number]

## 2021-05-03 ENCOUNTER — Other Ambulatory Visit: Payer: Self-pay | Admitting: Family Medicine

## 2021-05-04 ENCOUNTER — Ambulatory Visit: Payer: Medicare Other | Admitting: Urology

## 2021-05-04 ENCOUNTER — Other Ambulatory Visit: Payer: Self-pay | Admitting: Family Medicine

## 2021-05-04 DIAGNOSIS — E669 Obesity, unspecified: Secondary | ICD-10-CM

## 2021-05-05 ENCOUNTER — Telehealth: Payer: Self-pay | Admitting: *Deleted

## 2021-05-05 NOTE — Chronic Care Management (AMB) (Signed)
?  Care Management  ? ?Note ? ?05/05/2021 ?Name: AVIENDHA AZBELL MRN: [id number] DOB: [birth year] ? ?MAGHEN GROUP is a 71 y.o. year old female who is a primary care patient of Margarita Mail, DO. I reached out to [name] by phone today offer care coordination services.  ? ?Ms. Caridi was given information about care management services today including:  ?Care management services include personalized support from designated clinical staff supervised by her physician, including individualized plan of care and coordination with other care providers ?24/7 contact phone numbers for assistance for urgent and routine care needs. ?The patient may stop care management services at any time by phone call to the office staff. ? ?Patient agreed to services and verbal consent obtained.  ? ?Follow up plan: ?Telephone appointment with care management team member scheduled for: 06/22/2021 ? ?Akshar Starnes, CCMA ?Care Guide, Embedded Care Coordination ?  Care Management  ?Direct Dial: [phone] ? ? ?

## 2021-05-06 ENCOUNTER — Telehealth: Payer: Self-pay

## 2021-05-06 NOTE — Telephone Encounter (Signed)
Rosey Bath from [facility] calling to see if pt has tried over alternatives than Rybelsus. I advised her that speaking to the provider or her nurse would be better. She asked if a pt has tried a medication previously and if other medications were contraindicated or too costly to pt. I advised her I was unsure of these answers and best to speak with provider.  ?

## 2021-05-12 ENCOUNTER — Ambulatory Visit: Payer: Medicare Other | Admitting: Urology

## 2021-05-18 DIAGNOSIS — M159 Polyosteoarthritis, unspecified: Secondary | ICD-10-CM | POA: Diagnosis not present

## 2021-05-18 DIAGNOSIS — M7061 Trochanteric bursitis, right hip: Secondary | ICD-10-CM | POA: Diagnosis not present

## 2021-05-18 DIAGNOSIS — M7062 Trochanteric bursitis, left hip: Secondary | ICD-10-CM | POA: Diagnosis not present

## 2021-05-19 ENCOUNTER — Other Ambulatory Visit: Payer: Self-pay | Admitting: Family Medicine

## 2021-05-19 ENCOUNTER — Encounter: Payer: Self-pay | Admitting: Family Medicine

## 2021-05-19 MED ORDER — OZEMPIC (0.25 OR 0.5 MG/DOSE) 2 MG/1.5ML ~~LOC~~ SOPN: 0.2500 mg | PEN_INJECTOR | SUBCUTANEOUS | 0 refills | Status: DC

## 2021-05-19 MED ORDER — EXENATIDE ER 2 MG ~~LOC~~ PEN
2.0000 mg | PEN_INJECTOR | SUBCUTANEOUS | 1 refills | Status: DC
Start: 2021-05-19 — End: 2021-05-19

## 2021-06-15 ENCOUNTER — Telehealth: Payer: Self-pay

## 2021-06-15 NOTE — Progress Notes (Signed)
Chronic Care Management Pharmacy Assistant   Name: Sandy Salinas  MRN: [id number] DOB: Jan 22, 1951  Chart Review for clinical pharmacist on 06/22/2021 at 9:30 am.  Conditions to be addressed/monitored: HTN, HLD, DMII, Depression, Osteopenia, Osteoarthritis, and Abdominal aortic ectasia, Alzhemier dementia without behavioral disturbance, Vitamin D deficiency, Vertigo  Primary concerns for visit include: Overall Health.   Recent office visits:  05/19/2021 Theodis Blaze DO (PCP)  Stop Rybelsus due to cost, Start Ozempic 0.5 mg weekly (Samples given)  04/27/2021 Theodis Blaze DO (PCP)  Start Rybelsus 7 mg , patient received 3 mg samples to start then will start 7 mg 04/26/2021 Theodis Blaze DO (PCP) Stop Gabapentin, Stop Naproxen, Stop Zofran, Stop Oxycodone, Stop Prednisone, Stop Tamsulosin, Stop Tizanidine 04/19/2021 Serafina Royals FNP (PCP Office)  stop Vania Rea, start Diflucan 150 mg 04/11/2021 Serafina Royals FNP (PCP Office) Start naproxen 500 mg 2 times daily, start prednisone 10 mg taper pack, Start tizanidine 4 mg PRN, 03/21/2021  Theodis Blaze DO (PCP) start Zofran 4 mg PRN 03/15/2021 Theodis Blaze DO (PCP) start Tamsulosin 0.4 mg daily 03/08/2021 Theodis Blaze DO (PCP) start methyiprednisolone dose pack  02/24/2021 Serafina Royals FNP (PCP Office) No Medication Changes noted 02/04/2021  Teodora Medici DO (PCP) Start Jardiance 10 mg daily, Stop Trulicity, try Voltaren gel  01/25/2021 Teodora Medici DO (PCP) Start Trulicity [id number] mg Weekly, Return in about 3 months  12/22/2020 Rory Percy DO (PCP Office) Start Benzonatate 200 mg PRN   Recent consult visits:  05/18/2021 Dr. Posey Pronto MD (Rheumatology) Injection triamcinolone acetonide 40 mg given in both hips, Stop Actos, Stop Oxybutynin, Return in 6 months  Hospital visits:  Medication Reconciliation was completed by comparing discharge summary, patient's EMR and Pharmacy list, and upon discussion with  patient.  Admitted to the hospital on 03/17/2021 due to Pyelonephristis. Discharge date was 03/18/2021. Discharged from [facility]?Medications Started at [facility] Discharge:?? -started None ID  Medication Changes at Hospital Discharge: -Changed None ID  Medications Discontinued at Hospital Discharge: -Stopped methyiprednisolone    Medications that remain the same after Hospital Discharge:??  -All other medications will remain the same.    Admitted to the hospital on 01/16/2021 due to Injury of head. Discharge date was 01/16/2021. Discharged from [place]?Medications Started at [facility] Discharge:?? -started None ID  Medication Changes at Hospital Discharge: -Changed None ID  Medications Discontinued at Hospital Discharge: -Stopped None ID  Medications that remain the same after Hospital Discharge:??  -All other medications will remain the same.    Questions for Clinical Pharmacist:   1.Are you able to connect with Patient Yes     2.Confirmed appointment date/time with patient/caregiver? Confirm appointment on 06/22/2021 at 9:30 am with Daron Offer CPP      3.Visit type telephone     4.Patient/Caregiver instructed to bring medications to appointment. Patient is aware to bring all medications and supplements to appointment    5.What, if any, problems do you have getting your medications from the pharmacy? None,Patient denies any issues getting her medications.      6.What is your top health concern to discuss at your upcoming visit?  Patient denies any health concerns/issues at this time.   7.Have you seen any other providers since your last visit?   Patient denies seeing any other provider since her last visit.  Medications: Outpatient Encounter Medications as of 06/15/2021  Medication Sig   atorvastatin (LIPITOR) 80 MG tablet TAKE 1 TABLET (80 MG) BY  MOUTH AT BEDTIME   Calcium Carbonate-Vitamin D (CALTRATE  600+D PO) Take by mouth daily.   chlorthalidone (HYGROTON) 25 MG tablet Take 1 tablet (25 mg total) by mouth daily. (Patient taking differently: Take 25 mg by mouth daily. Pt taking QHS)   Cholecalciferol (VITAMIN D) 2000 units tablet Pt taking daily QHS   clopidogrel (PLAVIX) 75 MG tablet Take 75 mg by mouth daily. Pt taking QHS   DULoxetine (CYMBALTA) 30 MG capsule Take 30 mg by mouth at bedtime.   lisinopril (ZESTRIL) 10 MG tablet Take 1 tablet (10 mg total) by mouth daily. (Patient taking differently: Take 10 mg by mouth daily. Pt taking QHS)   Magnesium 100 MG TABS Take by mouth.   metFORMIN (GLUCOPHAGE) 1000 MG tablet Take 0.5 tablets (500 mg total) by mouth 2 (two) times daily with a meal.   mirabegron ER (MYRBETRIQ) 25 MG TB24 tablet Take 1 tablet (25 mg total) by mouth daily.   Multiple Vitamin (MULTIVITAMIN) tablet Take 1 tablet by mouth daily. Pt taking QHS   Potassium 99 MG TABS Take by mouth.   Semaglutide,0.25 or 0.5MG /DOS, (OZEMPIC, 0.25 OR 0.5 MG/DOSE,) 2 MG/1.5ML SOPN Inject 0.25 mg into the skin once a week.   TURMERIC PO Take 1 capsule by mouth at bedtime.   vitamin B-12 (CYANOCOBALAMIN) 100 MCG tablet Take by mouth daily. Pt taking 1000 mcg daily QHS   vitamin E 100 UNIT capsule Take by mouth daily. Pt taking QHS   No facility-administered encounter medications on file as of 06/15/2021.    Care Gaps: Shingrix Vaccine  Star Rating Drugs: Ozempic 0.25 mg - Not on fill report (Samples given from PCP office) Atorvastatin 80 mg last filled 04/30/2021 90 day supply at [facility]. Metformin 1000 mg  last filled 04/26/2021 90 day supply at [facility]. Lisinopril 10 mg  last filled 04/24/2021 90 day supply at [facility].  Medication Fill Gaps: DULoxetine 30 MG last filled 01/23/2021 90 day supply. Mirabegron ER 25 MG last filled 11/17/2019 30 day supply.  [place] Pharmacist Assistant [phone]

## 2021-06-17 ENCOUNTER — Ambulatory Visit (INDEPENDENT_AMBULATORY_CARE_PROVIDER_SITE_OTHER): Payer: Medicare Other | Admitting: Internal Medicine

## 2021-06-17 ENCOUNTER — Encounter: Payer: Self-pay | Admitting: Internal Medicine

## 2021-06-17 VITALS — BP 132/70 | HR 76 | Temp 98.0°F | Resp 16 | Ht 64.0 in | Wt 159.5 lb

## 2021-06-17 DIAGNOSIS — E669 Obesity, unspecified: Secondary | ICD-10-CM

## 2021-06-17 DIAGNOSIS — I10 Essential (primary) hypertension: Secondary | ICD-10-CM

## 2021-06-17 DIAGNOSIS — E782 Mixed hyperlipidemia: Secondary | ICD-10-CM | POA: Diagnosis not present

## 2021-06-17 DIAGNOSIS — E1169 Type 2 diabetes mellitus with other specified complication: Secondary | ICD-10-CM

## 2021-06-17 MED ORDER — OZEMPIC (0.25 OR 0.5 MG/DOSE) 2 MG/1.5ML ~~LOC~~ SOPN: 0.5000 mg | PEN_INJECTOR | SUBCUTANEOUS | 1 refills | Status: DC

## 2021-06-17 NOTE — Patient Instructions (Signed)
It was great seeing you today! ? ?Plan discussed at today's visit: ?-Continue Ozempic 0.5 mg weekly on Fridays ?-Plan to recheck A1c next time  ? ?Follow up in: 6 weeks  ? ?Take care and let us know if you have any questions or concerns prior to your next visit. ? ?Dr. Caralee Ates ? ?

## 2021-06-17 NOTE — Assessment & Plan Note (Signed)
Last A1c increased to 8.5%. She is on Metformin 500 mg BID but cannot tolerate higher doses due to GI side effects. She has been on Trilicty which she cannot tolerate also due to GI side effects and Jardiance which caused UTIs and yeast infections. She was started on Ozempic 0.25 mg in March which she is doing well with. She lost 6 pounds since starting. This will be increased to 0.5 mg weekly for another 4-6 weeks, with plans to eventually get to 2.4 mg as tolerated. Plan to recheck A1c at follow up.  ?

## 2021-06-17 NOTE — Progress Notes (Signed)
? ?Established Patient Office Visit ? ?Subjective:  ?Patient ID: Sandy Salinas, female    DOB: Mar 20, 1950  Age: 71 y.o. MRN: [id number] ? ?CC:  ?Chief Complaint  ?Patient presents with  ? Follow-up  ? Diabetes  ? ? ?HPI ?Sandy LovelyVeronica A Salinas presents for follow up on chronic medical conditions.  ? ?Hypertension: ?-Medications: Chlorthalidone 25, Lisinopril 10 mg ?-Patient is compliant with above medications and reports no side effects. ?-Checking BP at home (average): 120-130/70-80 ?-Denies any SOB, CP, vision changes, LE edema or symptoms of hypotension ? ?HLD: ?-Medications: Lipitor 80 ?-Patient is compliant with above medications and reports no side effects.  ?-Last lipid panel: Lipid Panel  ?   ?Component Value Date/Time  ? CHOL 126 04/26/2021 1506  ? CHOL 152 02/18/2015 0841  ? TRIG 184 (H) 04/26/2021 1506  ? HDL 33 (L) 04/26/2021 1506  ? HDL 40 02/18/2015 0841  ? CHOLHDL 3.8 04/26/2021 1506  ? VLDL 35 (H) 06/06/2016 0831  ? LDLCALC 67 04/26/2021 1506  ? LABVLDL 28 02/18/2015 0841  ? ?Diabetes, Type 2: ?-Last A1c 04/26/21 8.5% ?-Medications: Metformin 500 BID, Ozempic started at LOV 0.25 mg weekly, on Fridays. Since starting this medication she's lost about 6 pounds. She denies any side effects and is doing well with it.  ?-Had been on Trulicity which was discontinued due to GI side effects and recently Jardiance, which she could not tolerate due to increased UTIs and yeast infections  ?-Patient is compliant with the above medications and reports no side effects.  ?-Checking BG at home: No not checking, can tell when it gets low  ?-Lowest home BG since last visit: no hypoglycemic events since last visit  ?-Diet: stays away from bread and most carbs, eating more salads and fruits ?-Exercise: No regular exercise but active, does yard work ?-Eye exam: November 2022 ?-Foot exam: UTD ?-Microalbumin: 10/21/20 normal ?-Statin: Lipitor 80 mg  ?-PNA vaccine: Prevnar 13 in 2018, Prevnar 23 in 2019 ?-Denies symptoms of  hypoglycemia, polyuria, polydipsia, numbness extremities, foot ulcers/trauma.  ? ?Osteoporosis:  ?-Currently on calcium and Vitamin D ?-DEXA 8/22 showing t score of -1.9 ? ?Memory Loss: ?-Previously seen by Neuro in 9/21 for an episode of memory loss, considered dementia vs. TIA ?-EEG and MRI showing ischemic disease, started on Plavix by Dr. Sherryll BurgerShah  ?-Does have family history of dementia in mother and aunts/uncles ? ?Urge Incontinence:  ?-Currently on Myrbetriq and following with Urology  ? ?Past Medical History:  ?Diagnosis Date  ? Abdominal aortic ectasia (HCC) 05/19/2016  ? US April 2018, rescan in five years (April 2023)  ? Breast calcification seen on mammogram 12/01/2016  ? LEFT  ? Cataract   ? Depression 03/18/2021  ? Diabetes mellitus without complication (HCC)   ? Family history of abdominal aortic aneurysm (AAA) 05/12/2016  ? father  ? Hyperlipidemia   ? Hypertension   ? Lumbar radiculopathy, acute   ? Microalbuminuria due to type 2 diabetes mellitus (HCC)   ? Obesity   ? Osteoporosis   ? Primary osteoarthritis of left knee   ? Psoriasis, unspecified   ? Pyelonephritis 03/17/2021  ? Seizures (HCC)   ? ? ?Past Surgical History:  ?Procedure Laterality Date  ? ABDOMINAL HYSTERECTOMY  1975  ? total  ? BREAST BIOPSY Right 04/09/2012  ? x 2, FIBROADENOMA WITH COARSE CALCIFICATIONS.done by byrnett  ? BREAST BIOPSY Left 09/04/2017  ? Affirm Bx- X clip, FIBROADENOMATOUS CHANGE WITH CALCIFICATIONS. NEGATIVE FOR ATYPIA AND MALIGNANCY  ? CYSTECTOMY  2003  ?  ROUX-EN-Y GASTRIC BYPASS  2009  ? TONSILLECTOMY  1970  ? TUBAL LIGATION  1975  ? ? ?Family History  ?Problem Relation Age of Onset  ? Alzheimer's disease Mother   ? Heart murmur Mother   ? Dementia Mother   ? Arthritis Mother   ? AAA (abdominal aortic aneurysm) Father   ? Heart disease Father   ? Asthma Sister   ? Hypertension Sister   ? Cancer Sister   ?     breast  ? Breast cancer Sister   ?     late 27's  ? Heart disease Brother   ? Hypertension Brother   ? Aneurysm  Paternal Aunt   ? Lung cancer Paternal Uncle   ? Cancer Paternal Grandmother   ?     possible, not sure what kind  ? Cancer Cousin   ? ? ?Social History  ? ?Socioeconomic History  ? Marital status: Married  ?  Spouse name: Loraine Leriche  ? Number of children: 2  ? Years of education: Not on file  ? Highest education level: Some college, no degree  ?Occupational History  ? Occupation: retired  ?Tobacco Use  ? Smoking status: Never  ? Smokeless tobacco: Never  ?Vaping Use  ? Vaping Use: Never used  ?Substance and Sexual Activity  ? Alcohol use: No  ? Drug use: No  ? Sexual activity: Not Currently  ?  Birth control/protection: Abstinence  ?  Comment: Old Age  ?Other Topics Concern  ? Not on file  ?Social History Narrative  ? 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   ? ?Social Determinants of Health  ? ?Financial Resource Strain: Low Risk   ? Difficulty of Paying Living Expenses: Not very hard  ?Food Insecurity: No Food Insecurity  ? Worried About Programme researcher, broadcasting/film/video in the Last Year: Never true  ? Ran Out of Food in the Last Year: Never true  ?Transportation Needs: No Transportation Needs  ? Lack of Transportation (Medical): No  ? Lack of Transportation (Non-Medical): No  ?Physical Activity: Inactive  ? Days of Exercise per Week: 0 days  ? Minutes of Exercise per Session: 0 min  ?Stress: No Stress Concern Present  ? Feeling of Stress : Not at all  ?Social Connections: Moderately Isolated  ? Frequency of Communication with Friends and Family: More than three times a week  ? Frequency of Social Gatherings with Friends and Family: More than three times a week  ? Attends Religious Services: Never  ? Active Member of Clubs or Organizations: No  ? Attends Banker Meetings: Never  ? Marital Status: Married  ?Intimate Partner Violence: Not At Risk  ? Fear of Current or Ex-Partner: No  ? Emotionally Abused: No  ? Physically Abused: No  ? Sexually Abused: No  ? ? ?Outpatient Medications Prior to Visit  ?Medication  Sig Dispense Refill  ? atorvastatin (LIPITOR) 80 MG tablet TAKE 1 TABLET (80 MG) BY MOUTH AT BEDTIME 90 tablet 3  ? Calcium Carbonate-Vitamin D (CALTRATE 600+D PO) Take by mouth daily.    ? chlorthalidone (HYGROTON) 25 MG tablet Take 1 tablet (25 mg total) by mouth daily. (Patient taking differently: Take 25 mg by mouth daily. Pt taking QHS) 90 tablet 3  ? Cholecalciferol (VITAMIN D) 2000 units tablet Pt taking daily QHS    ? clopidogrel (PLAVIX) 75 MG tablet Take 75 mg by mouth daily. Pt taking QHS    ? lisinopril (ZESTRIL) 10 MG tablet Take  1 tablet (10 mg total) by mouth daily. (Patient taking differently: Take 10 mg by mouth daily. Pt taking QHS) 90 tablet 3  ? Magnesium 100 MG TABS Take by mouth.    ? metFORMIN (GLUCOPHAGE) 1000 MG tablet Take 0.5 tablets (500 mg total) by mouth 2 (two) times daily with a meal. 180 tablet 2  ? mirabegron ER (MYRBETRIQ) 25 MG TB24 tablet Take 1 tablet (25 mg total) by mouth daily. 30 tablet 11  ? Multiple Vitamin (MULTIVITAMIN) tablet Take 1 tablet by mouth daily. Pt taking QHS    ? Potassium 99 MG TABS Take by mouth.    ? TURMERIC PO Take 1 capsule by mouth at bedtime.    ? vitamin B-12 (CYANOCOBALAMIN) 100 MCG tablet Take by mouth daily. Pt taking 1000 mcg daily QHS    ? vitamin E 100 UNIT capsule Take by mouth daily. Pt taking QHS    ? Semaglutide,0.25 or 0.5MG /DOS, (OZEMPIC, 0.25 OR 0.5 MG/DOSE,) 2 MG/1.5ML SOPN Inject 0.25 mg into the skin once a week. 1.5 mL 0  ? DULoxetine (CYMBALTA) 30 MG capsule Take 30 mg by mouth at bedtime.    ? ?No facility-administered medications prior to visit.  ? ? ?Allergies  ?Allergen Reactions  ? Codeine Nausea Only and Other (See Comments)  ?  Hallucinations  ? Jardiance [Empagliflozin] Other (See Comments)  ?  Recurrent UTI, yeast infection  ? ? ?ROS ?Review of Systems  ?Constitutional:  Negative for chills and fever.  ?Eyes:  Negative for visual disturbance.  ?Respiratory:  Negative for cough and shortness of breath.   ?Cardiovascular:   Negative for chest pain.  ?Gastrointestinal:  Negative for abdominal distention, abdominal pain, diarrhea, nausea and vomiting.  ?Neurological:  Negative for dizziness and headaches.  ? ?  ?Objective:  ?

## 2021-06-17 NOTE — Assessment & Plan Note (Signed)
Stable, continue Lipitor 80 mg.  ?

## 2021-06-17 NOTE — Assessment & Plan Note (Signed)
Stable, blood pressure at goal today. Continue Chlorthalidone 25 and Lisinopril 100 mg daily.  ?

## 2021-06-22 ENCOUNTER — Encounter: Payer: Self-pay | Admitting: Internal Medicine

## 2021-06-22 ENCOUNTER — Ambulatory Visit (INDEPENDENT_AMBULATORY_CARE_PROVIDER_SITE_OTHER): Payer: Medicare Other | Admitting: Internal Medicine

## 2021-06-22 ENCOUNTER — Ambulatory Visit: Payer: Medicare Other

## 2021-06-22 ENCOUNTER — Telehealth: Payer: Self-pay

## 2021-06-22 VITALS — BP 116/78 | HR 97 | Temp 97.9°F | Resp 16 | Ht 64.0 in | Wt 159.2 lb

## 2021-06-22 DIAGNOSIS — E1169 Type 2 diabetes mellitus with other specified complication: Secondary | ICD-10-CM

## 2021-06-22 DIAGNOSIS — N3000 Acute cystitis without hematuria: Secondary | ICD-10-CM | POA: Diagnosis not present

## 2021-06-22 DIAGNOSIS — E669 Obesity, unspecified: Secondary | ICD-10-CM | POA: Diagnosis not present

## 2021-06-22 DIAGNOSIS — F0280 Dementia in other diseases classified elsewhere without behavioral disturbance: Secondary | ICD-10-CM

## 2021-06-22 DIAGNOSIS — I10 Essential (primary) hypertension: Secondary | ICD-10-CM

## 2021-06-22 DIAGNOSIS — R300 Dysuria: Secondary | ICD-10-CM | POA: Diagnosis not present

## 2021-06-22 DIAGNOSIS — E1129 Type 2 diabetes mellitus with other diabetic kidney complication: Secondary | ICD-10-CM

## 2021-06-22 DIAGNOSIS — M8589 Other specified disorders of bone density and structure, multiple sites: Secondary | ICD-10-CM

## 2021-06-22 LAB — POCT URINALYSIS DIPSTICK
Bilirubin, UA: NEGATIVE
Blood, UA: POSITIVE
Glucose, UA: NEGATIVE
Ketones, UA: NEGATIVE
Nitrite, UA: NEGATIVE
Odor: NORMAL
Protein, UA: NEGATIVE
Spec Grav, UA: 1.02 (ref 1.010–1.025)
Urobilinogen, UA: 0.2 E.U./dL
pH, UA: 6 (ref 5.0–8.0)

## 2021-06-22 MED ORDER — SULFAMETHOXAZOLE-TRIMETHOPRIM 800-160 MG PO TABS: 1.0000 | ORAL_TABLET | Freq: Two times a day (BID) | ORAL | 0 refills | Status: AC

## 2021-06-22 MED ORDER — RYBELSUS 3 MG PO TABS: 3.0000 mg | ORAL_TABLET | Freq: Every day | ORAL | 1 refills | Status: DC

## 2021-06-22 NOTE — Progress Notes (Signed)
Chronic Care Management Pharmacy Note  07/05/2021 Name:  Sandy Salinas MRN:  [id number] DOB:  July 28, 1950  Summary: Patient presents for Initial CCM Consult. Cost concerns with Ozempic.   Recommendations/Changes made from today's visit: Continue current medications Start PAP for Ozempic.   Plan: CPP follow-up 3 months  Subjective: Sandy Salinas is an 71 y.o. year old female who is a primary patient of Teodora Medici, DO.  The CCM team was consulted for assistance with disease management and care coordination needs.    Engaged with patient by telephone for initial visit in response to provider referral for pharmacy case management and/or care coordination services.   Consent to Services:  The patient was given the following information about Chronic Care Management services today, agreed to services, and gave verbal consent: 1. CCM service includes personalized support from designated clinical staff supervised by the primary care provider, including individualized plan of care and coordination with other care providers 2. 24/7 contact phone numbers for assistance for urgent and routine care needs. 3. Service will only be billed when office clinical staff spend 20 minutes or more in a month to coordinate care. 4. Only one practitioner may furnish and bill the service in a calendar month. 5.The patient may stop CCM services at any time (effective at the end of the month) by phone call to the office staff. 6. The patient will be responsible for cost sharing (co-pay) of up to 20% of the service fee (after annual deductible is met). Patient agreed to services and consent obtained.  Patient Care Team: Teodora Medici, DO as PCP - General (Internal Medicine) Duke Salvia Venia Carbon, MD ([place]) Annie Sable, Gaetana Michaelis, MD (Dermatology) Gabriel Carina, Betsey Holiday, MD as Physician Assistant (Endocrinology) Quintin Alto, MD as Consulting Physician (Rheumatology) Vladimir Crofts, MD as Consulting  Physician (Neurology) Yolonda Kida, MD as Consulting Physician (Cardiology) Germaine Pomfret, [facility] (Pharmacist)  Recent office visits: 05/19/2021 Theodis Blaze DO (PCP)  Stop Rybelsus due to cost, Start Ozempic 0.5 mg weekly (Samples given)  04/27/2021 Theodis Blaze DO (PCP)  Start Rybelsus 7 mg , patient received 3 mg samples to start then will start 7 mg 04/26/2021 Theodis Blaze DO (PCP) Stop Gabapentin, Stop Naproxen, Stop Zofran, Stop Oxycodone, Stop Prednisone, Stop Tamsulosin, Stop Tizanidine 04/19/2021 Serafina Royals FNP (PCP Office)  stop Vania Rea, start Diflucan 150 mg 04/11/2021 Serafina Royals FNP (PCP Office) Start naproxen 500 mg 2 times daily, start prednisone 10 mg taper pack, Start tizanidine 4 mg PRN, 03/21/2021  Theodis Blaze DO (PCP) start Zofran 4 mg PRN 03/15/2021 Theodis Blaze DO (PCP) start Tamsulosin 0.4 mg daily 03/08/2021 Theodis Blaze DO (PCP) start methyiprednisolone dose pack  02/24/2021 Serafina Royals FNP (PCP Office) No Medication Changes noted 02/04/2021  Teodora Medici DO (PCP) Start Jardiance 10 mg daily, Stop Trulicity, try Voltaren gel  01/25/2021 Teodora Medici DO (PCP) Start Trulicity 6.96 mg Weekly, Return in about 3 months  12/22/2020 Rory Percy DO (PCP Office) Start Benzonatate 200 mg PRN   Recent consult visits: 05/18/2021 Dr. Posey Pronto MD (Rheumatology) Injection triamcinolone acetonide 40 mg given in both hips, Stop Actos, Stop Oxybutynin, Return in 6 months  Hospital visits: Admitted to the hospital on 03/17/2021 due to Pyelonephristis. Discharge date was 03/18/2021. Discharged from [facility].   Admitted to the hospital on 01/16/2021 due to Injury of head. Discharge date was 01/16/2021. Discharged from [facility].    Objective:  Lab Results  Component Value Date   CREATININE 0.58 03/18/2021  BUN 13 03/18/2021   EGFR 79 02/24/2021   GFRNONAA >60 03/18/2021   GFRAA 104 09/23/2019   NA  137 03/18/2021   K 3.9 03/18/2021   CALCIUM 9.2 03/18/2021   CO2 29 03/18/2021   GLUCOSE 127 (H) 03/18/2021    Lab Results  Component Value Date/Time   HGBA1C 8.5 (H) 04/26/2021 03:06 PM   HGBA1C 8.2 (A) 01/25/2021 03:12 PM   HGBA1C 8.3 (H) 10/21/2020 03:05 PM   MICROALBUR <0.2 10/21/2020 03:05 PM   MICROALBUR <0.2 04/27/2020 03:46 PM   MICROALBUR 10 02/18/2015 08:41 AM    Last diabetic Eye exam:  Lab Results  Component Value Date/Time   HMDIABEYEEXA No Retinopathy 11/01/2015 12:00 AM    Last diabetic Foot exam: No results found for: HMDIABFOOTEX   Lab Results  Component Value Date   CHOL 126 04/26/2021   HDL 33 (L) 04/26/2021   LDLCALC 67 04/26/2021   TRIG 184 (H) 04/26/2021   CHOLHDL 3.8 04/26/2021       Latest Ref Rng & Units 03/17/2021    8:07 PM 02/24/2021    2:07 PM 10/21/2020    3:05 PM  Hepatic Function  Total Protein 6.5 - 8.1 g/dL 6.9   6.7   6.6    Albumin 3.5 - 5.0 g/dL 3.6      AST 15 - 41 U/L '18   17   22    ' ALT 0 - 44 U/L '11   7   11    ' Alk Phosphatase 38 - 126 U/L 61      Total Bilirubin 0.3 - 1.2 mg/dL 0.6   0.3   0.3    Bilirubin, Direct 0.0 - 0.2 mg/dL 0.1        Lab Results  Component Value Date/Time   TSH 1.56 02/24/2021 02:07 PM   TSH 1.75 04/27/2020 03:46 PM       Latest Ref Rng & Units 03/18/2021    3:59 AM 03/17/2021    6:11 PM 02/24/2021    2:07 PM  CBC  WBC 4.0 - 10.5 K/uL 13.2   15.7   10.0    Hemoglobin 12.0 - 15.0 g/dL 12.5   12.4   12.8    Hematocrit 36.0 - 46.0 % 39.6   39.2   40.2    Platelets 150 - 400 K/uL 584   611   521      Lab Results  Component Value Date/Time   VD25OH 35 02/24/2021 02:07 PM   VD25OH 45 11/14/2016 10:44 AM    Clinical ASCVD: No  The ASCVD Risk score (Arnett DK, et al., 2019) failed to calculate for the following reasons:   The valid total cholesterol range is 130 to 320 mg/dL       06/22/2021    2:52 PM 06/17/2021    1:50 PM 04/26/2021    2:35 PM  Depression screen PHQ 2/9  Decreased  Interest 0 0 0  Down, Depressed, Hopeless 0 0 0  PHQ - 2 Score 0 0 0  Altered sleeping 0 0 0  Tired, decreased energy 0 0 0  Change in appetite 0 0 0  Feeling bad or failure about yourself  0 0 0  Trouble concentrating 0 0 0  Moving slowly or fidgety/restless 0 0 0  Suicidal thoughts 0 0 0  PHQ-9 Score 0 0 0  Difficult doing work/chores Not difficult at all Not difficult at all Not difficult at all    Social History  Tobacco Use  Smoking Status Never  Smokeless Tobacco Never   BP Readings from Last 3 Encounters:  06/22/21 116/78  06/17/21 132/70  04/26/21 128/80   Pulse Readings from Last 3 Encounters:  06/22/21 97  06/17/21 76  04/26/21 68   Wt Readings from Last 3 Encounters:  06/22/21 159 lb 3.2 oz (72.2 kg)  06/17/21 159 lb 8 oz (72.3 kg)  04/26/21 165 lb 6.4 oz (75 kg)   BMI Readings from Last 3 Encounters:  06/22/21 27.33 kg/m  06/17/21 27.38 kg/m  04/26/21 28.39 kg/m    Assessment/Interventions: Review of patient past medical history, allergies, medications, health status, including review of consultants reports, laboratory and other test data, was performed as part of comprehensive evaluation and provision of chronic care management services.   SDOH:  (Social Determinants of Health) assessments and interventions performed: Yes SDOH Interventions    Flowsheet Row Most Recent Value  SDOH Interventions   Financial Strain Interventions Other (Comment)  [PAP]  Transportation Interventions Intervention Not Indicated      SDOH Screenings   Alcohol Screen: Low Risk    Last Alcohol Screening Score (AUDIT): 0  Depression (PHQ2-9): Low Risk    PHQ-2 Score: 0  Financial Resource Strain: High Risk   Difficulty of Paying Living Expenses: Hard  Food Insecurity: No Food Insecurity   Worried About Charity fundraiser in the Last Year: Never true   Ran Out of Food in the Last Year: Never true  Housing: Low Risk    Last Housing Risk Score: 0  Physical  Activity: Inactive   Days of Exercise per Week: 0 days   Minutes of Exercise per Session: 0 min  Social Connections: Moderately Isolated   Frequency of Communication with Friends and Family: More than three times a week   Frequency of Social Gatherings with Friends and Family: More than three times a week   Attends Religious Services: Never   Marine scientist or Organizations: No   Attends Music therapist: Never   Marital Status: Married  Stress: No Stress Concern Present   Feeling of Stress : Not at all  Tobacco Use: Low Risk    Smoking Tobacco Use: Never   Smokeless Tobacco Use: Never   Passive Exposure: Not on file  Transportation Needs: No Transportation Needs   Lack of Transportation (Medical): No   Lack of Transportation (Non-Medical): No    CCM Care Plan  Allergies  Allergen Reactions   Codeine Nausea Only and Other (See Comments)    Hallucinations   Jardiance [Empagliflozin] Other (See Comments)    Recurrent UTI, yeast infection    Medications Reviewed Today     Reviewed by Teodora Medici, DO (Physician) on 06/22/21 at [place] List Status: <None>   Medication Order Taking? Sig Documenting Provider Last Dose Status Informant  Ascorbic Acid (VITAMIN C) 1000 MG tablet [id number] Yes Take 1,000 mg by mouth daily. [provider] Taking Active   atorvastatin (LIPITOR) 80 MG tablet [id number] Yes TAKE 1 TABLET (80 MG) BY MOUTH AT BEDTIME Delsa Grana, PA-C Taking Active Multiple Informants  Calcium Carbonate-Vitamin D (CALTRATE 600+D PO) [id number] Yes Take by mouth daily. [provider] Taking Active Multiple Informants           Med Note Clemetine Marker D   Tue Aug 19, 2019  3:01 PM)    chlorthalidone (HYGROTON) 25 MG tablet [id number] Yes Take 1 tablet (25 mg total) by mouth daily.  Patient taking  differently: Take 25 mg by mouth daily. Pt taking QHS   Delsa Grana, PA-C Taking Active Multiple Informants  clopidogrel (PLAVIX) 75  MG tablet [id number] Yes Take 75 mg by mouth daily. Pt taking QHS [provider] Taking Active Multiple Informants           Med Note Daron Offer A   Wed Jun 22, 2021 10:13 AM) Prescribed by Dr. Manuella Ghazi  DULoxetine (CYMBALTA) 30 MG capsule [id number] Yes Take 30 mg by mouth at bedtime. [provider] Taking Active            Med Note Daron Offer A   Wed Jun 22, 2021 10:18 AM) Prescribed by Dr. Posey Pronto  lisinopril (ZESTRIL) 10 MG tablet [id number] Yes Take 1 tablet (10 mg total) by mouth daily.  Patient taking differently: Take 10 mg by mouth daily. Pt taking QHS   Delsa Grana, PA-C Taking Active Multiple Informants  metFORMIN (GLUCOPHAGE) 1000 MG tablet [id number] Yes Take 0.5 tablets (500 mg total) by mouth 2 (two) times daily with a meal. Delsa Grana, PA-C Taking Active Multiple Informants  Multiple Vitamin (MULTIVITAMIN) tablet [id number] Yes Take 1 tablet by mouth daily. Pt taking QHS [provider] Taking Active Multiple Informants  Semaglutide (RYBELSUS) 3 MG TABS [id number] Yes Take 3 mg by mouth daily. Teodora Medici, DO  Active   sulfamethoxazole-trimethoprim (BACTRIM DS) 800-160 MG tablet [id number] Yes Take 1 tablet by mouth 2 (two) times daily for 3 days. Teodora Medici, DO  Active   TURMERIC PO [id number] Yes Take 1 capsule by mouth at bedtime. [provider] Taking Active Multiple Informants           Med Note Clemetine Marker D   Tue Aug 19, 2019  3:01 PM)    vitamin B-12 (CYANOCOBALAMIN) 100 MCG tablet [id number] Yes Take by mouth daily. Pt taking 1000 mcg daily QHS [provider] Taking Active Multiple Informants            Patient Active Problem List   Diagnosis Date Noted   Leukocytosis 03/18/2021   Depression 03/18/2021   Bicipital tendonitis of right shoulder 11/10/2020   Sprain of left foot 11/10/2020   Contusion of right chest wall 11/28/2019   Alzheimer's dementia without behavioral disturbance ([place])  11/17/2019   Family history of Alzheimer's disease 11/17/2019   Urge incontinence 11/17/2019   Urinary frequency 11/17/2019   Spells of decreased attentiveness 10/28/2019   Trochanteric bursitis of left hip 05/20/2019   Gait abnormality 03/25/2019   Vertigo 03/25/2019   Microalbuminuria due to type 2 diabetes mellitus ([place]) 02/20/2018   Obesity (BMI 30.0-34.9) 02/19/2017   Breast calcification seen on mammogram 12/01/2016   Osteopenia 11/08/2016   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   Abdominal aortic ectasia (HCC) 05/19/2016   Abnormal EKG 05/12/2016   Localized osteoarthritis of lumbar spine 12/13/2015   Xerosis of skin 12/13/2015   Psoriatic arthritis ([place]) 11/18/2015   Lumbar radiculopathy, acute 10/14/2015   Vitamin D deficiency 08/18/2014   Hyperlipidemia    Hypertension    Primary osteoarthritis of left knee    Diabetes mellitus type 2 in obese ([place]) 07/24/2014   Status post bariatric surgery 07/24/2014    Immunization History  Administered Date(s) Administered   Influenza, High Dose Seasonal PF 11/14/2016, 09/24/2018   Influenza, Seasonal, Injecte, Preservative Fre 01/10/2007   Influenza,inj,Quad PF,6+ Mos 10/14/2015   Influenza-Unspecified 09/06/2013, 09/26/2019, 10/21/2020   PFIZER Comirnaty(Gray Top)Covid-19 Tri-Sucrose Vaccine 02/27/2019, 03/20/2019   PFIZER(Purple Top)SARS-COV-2 Vaccination 02/27/2019, 03/20/2019, 11/03/2019, 05/04/2020, 10/21/2020  Pneumococcal Conjugate-13 11/14/2016   Pneumococcal Polysaccharide-23 11/20/2017   Td 02/07/2004   Tdap 04/14/2016    Conditions to be addressed/monitored:  Hypertension, Hyperlipidemia, Diabetes, Depression, Osteopenia, Osteoarthritis, Overactive Bladder, and Alzheimer's Dementia  Care Plan : General Pharmacy (Adult)  Updates made by Germaine Pomfret, RPH since 07/05/2021 12:00 AM     Problem: Hypertension, Hyperlipidemia, Diabetes, Depression, Osteopenia, Osteoarthritis, Overactive Bladder, and  Alzheimer's Dementia   Priority: High     Long-Range Goal: Patient-Specific Goal   Start Date: 07/05/2021  Expected End Date: 07/06/2022  This Visit's Progress: On track  Priority: High  Note:   Current Barriers:  Unable to independently afford treatment regimen Unable to achieve control of diabetes   Pharmacist Clinical Goal(s):  Patient will verbalize ability to afford treatment regimen achieve control of diabetes as evidenced by A1c less 8% through collaboration with PharmD and provider.   Interventions: 1:1 collaboration with Teodora Medici, DO regarding development and update of comprehensive plan of care as evidenced by provider attestation and co-signature Inter-disciplinary care team collaboration (see longitudinal plan of care) Comprehensive medication review performed; medication list updated in electronic medical record  Hypertension (BP goal <140/90) -Controlled -Current treatment: Chlorthalidone 25 mg daily: Appropriate, Effective, Safe, Accessible Takes nightly, denies nocturia  Lisinopril 10 mg daily: Appropriate, Effective, Safe, Accessible  -Medications previously tried: NA  -Current home readings: Does not monitor regularly.  -Denies hypotensive/hypertensive symptom -Recommended to continue current medication  Hyperlipidemia: (LDL goal < 70) -Controlled -Potential TIA Sep 2021  -Current treatment: Atorvastatin 80 mg daily: Appropriate, Effective, Safe, Accessible  -Current antiplatelet treatment: Clopidogrel 75 mg daily: Appropriate, Effective, Safe, Accessible -Medications previously tried: NA  -Bruises easily, one instance of hematuria a few months prior. Otherwise tolerated clopidogrel well. -Recommended to continue current medication  Diabetes (A1c goal <8%) -Uncontrolled -Complications: frequent UTIs  -Current medications: Metformin 500 mg twice daily: Appropriate, Query effective Ozempic 0.25 mg weekly (Fridays): Appropriate, Query  effective Affordability concerns with Ozempic cost. -Medications previously tried: Trulicity (GI Upset), Jardiance (Yeast infections), Rybelsus (Cost)  -Current home glucose readings fasting glucose: 120s-130s  post prandial glucose: 80 -Reports hypoglycemic/hyperglycemic symptoms: shaky  -Current meal patterns: Significant appetite reduction with Ozempic. Has lost 6 pounds.  lunch: Skips sometimes or will have light snacks  drinks: Diet coke (2 bottles), Unsweetened tea with lunch, water -Recommended to continue current medication -START PAP for Ozempic  Osteopenia (Goal Prevent fractures) -Controlled -Last DEXA Scan: 09/14/20   T-Score femoral neck: -1.9  T-Score total hip: -1.2  T-Score lumbar spine: -1.9  10-year probability of major osteoporotic fracture: 11.2%  10-year probability of hip fracture: 2.0% -Patient is not a candidate for pharmacologic treatment -Current treatment  Calcium + D3 daily -Medications previously tried: NA  -Recommended to continue current medication  Patient Goals/Self-Care Activities Patient will:  - check glucose daily before breakfast, document, and provide at future appointments  Follow Up Plan: Telephone follow up appointment with care management team member scheduled for:  10/05/2021 at 3:00 PM      Medication Assistance: Application for Ozempic  medication assistance program. in process.  Anticipated assistance start date TBD.  See plan of care for additional detail.  Compliance/Adherence/Medication fill history: Care Gaps: Shingrix Vaccine  Star-Rating Drugs: Atorvastatin 80 mg last filled 04/30/2021 90 day supply at [facility]. Metformin 1000 mg  last filled 04/26/2021 90 day supply at [facility]. Lisinopril 10 mg  last filled 04/24/2021 90 day supply at [facility].  Patient's preferred pharmacy is:  [id number] [address], [place] AT [facility] OF [address] [place] [address] Phone: [phone] Fax: [phone]  [place] [address] Phone: [phone] Fax: [phone]  Uses pill box? Yes Pt endorses 100% compliance  We discussed: Current pharmacy is preferred with insurance plan and patient is satisfied with pharmacy services Patient decided to: Continue current medication management strategy  Care Plan and Follow Up Patient Decision:  Patient agrees to Care Plan and Follow-up.  Plan: Telephone follow up appointment with care management team member scheduled for:  10/05/2021 at 3:00 PM  Junius Argyle, PharmD, [facility] [place], [place] [phone]

## 2021-06-22 NOTE — Assessment & Plan Note (Signed)
A1c 8.5%, had been doing well with Ozempic but can't afford it. Will try Rybelsus, patient will call insurance to see what they will cover. Follow up in 1 month.  ?

## 2021-06-22 NOTE — Patient Instructions (Addendum)
It was great seeing you today! ? ?Plan discussed at today's visit: ?-Urine dip showing white cells, which can be due to infection ?-Treat with Bactrim twice a day for 3 days ?-Urine culture sent ?-Will try Rybelsus 3 mg daily (pill) can cause same GI side effects, plan to increase to next dose in 1 month  ? ?Follow up in: 1 month  ? ?Take care and let us know if you have any questions or concerns prior to your next visit. ? ?Dr. Caralee Ates ? ?

## 2021-06-22 NOTE — Progress Notes (Signed)
? ?Acute Office Visit ? ?Subjective:  ? ?  ?Patient ID: Sandy Salinas, female    DOB: 17-Jul-1950, 71 y.o.   MRN: [id number] ? ?Chief Complaint  ?Patient presents with  ? Urinary Tract Infection  ? ? ?HPI ?Patient is in today for UTI like symptoms.  ? ?URINARY SYMPTOMS ?Dysuria: no ?Urinary frequency: no ?Urgency: no ?Small volume voids: yes ?Urinary incontinence: no ?Foul odor: no ?Hematuria: no ?Abdominal pain: yes ?Back pain: no ?Suprapubic pain/pressure: yes ?Flank pain: no ?Fever:  no ? ?Diabetes, Type 2: ?-Last A1c 04/26/21 8.5% ?-Medications: Metformin 500 BID. Had been doing well on Ozempic but cannot afford it.  ?-Had been on Trulicity which was discontinued due to GI side effects and recently Jardiance, which she could not tolerate due to increased UTIs and yeast infections  ?-Patient is compliant with the above medications and reports no side effects.  ?-Checking BG at home: No not checking, can tell when it gets low  ?-Lowest home BG since last visit: no hypoglycemic events since last visit  ?-Diet: stays away from bread and most carbs, eating more salads and fruits ?-Exercise: No regular exercise but active, does yard work ?-Eye exam: November 2022 ?-Foot exam: UTD ?-Microalbumin: 10/21/20 normal ?-Statin: Lipitor 80 mg  ?-PNA vaccine: Prevnar 13 in 2018, Prevnar 23 in 2019 ?-Denies symptoms of hypoglycemia, polyuria, polydipsia, numbness extremities, foot ulcers/trauma.  ?  ? ?Review of Systems  ?Constitutional:  Negative for chills and fever.  ?Gastrointestinal:  Positive for abdominal pain. Negative for nausea and vomiting.  ?Genitourinary:  Negative for dysuria, flank pain, frequency, hematuria and urgency.  ? ? ?   ?Objective:  ?  ?BP 116/78   Pulse 97   Temp 97.9 ?F (36.6 ?C)   Resp 16   Ht 5\' 4"  (1.626 m)   Wt 159 lb 3.2 oz (72.2 kg)   SpO2 97%   BMI 27.33 kg/m?  ?BP Readings from Last 3 Encounters:  ?06/22/21 116/78  ?06/17/21 132/70  ?04/26/21 128/80  ? ?Wt Readings from Last 3  Encounters:  ?06/22/21 159 lb 3.2 oz (72.2 kg)  ?06/17/21 159 lb 8 oz (72.3 kg)  ?04/26/21 165 lb 6.4 oz (75 kg)  ? ?  ? ?Physical Exam ?Constitutional:   ?   Appearance: Normal appearance.  ?HENT:  ?   Head: Normocephalic and atraumatic.  ?Eyes:  ?   Conjunctiva/sclera: Conjunctivae normal.  ?Cardiovascular:  ?   Rate and Rhythm: Normal rate and regular rhythm.  ?Pulmonary:  ?   Effort: Pulmonary effort is normal.  ?   Breath sounds: Normal breath sounds.  ?Abdominal:  ?   Tenderness: There is no right CVA tenderness or left CVA tenderness.  ?Musculoskeletal:  ?   Right lower leg: No edema.  ?   Left lower leg: No edema.  ?Skin: ?   General: Skin is warm and dry.  ?Neurological:  ?   General: No focal deficit present.  ?   Mental Status: She is alert. Mental status is at baseline.  ?Psychiatric:     ?   Mood and Affect: Mood normal.     ?   Behavior: Behavior normal.  ? ? ?Results for orders placed or performed in visit on 06/22/21  ?POCT Urinalysis Dipstick  ?Result Value Ref Range  ? Color, UA drk yellow   ? Clarity, UA clear   ? Glucose, UA Negative Negative  ? Bilirubin, UA neg   ? Ketones, UA neg   ? Spec Grav,  UA 1.020 1.010 - 1.025  ? Blood, UA positive   ? pH, UA 6.0 5.0 - 8.0  ? Protein, UA Negative Negative  ? Urobilinogen, UA 0.2 0.2 or 1.0 E.U./dL  ? Nitrite, UA neg   ? Leukocytes, UA Large (3+) (A) Negative  ? Appearance clear   ? Odor normal   ? ? ? ?   ?Assessment & Plan:  ? ?Problem List Items Addressed This Visit   ? ?  ? Endocrine  ? Diabetes mellitus type 2 in obese [facility])  ?  A1c 8.5%, had been doing well with Ozempic but can't afford it. Will try Rybelsus, patient will call insurance to see what they will cover. Follow up in 1 month.  ? ?  ?  ? Relevant Medications  ? Semaglutide (RYBELSUS) 3 MG TABS  ? ?Other Visit Diagnoses   ? ? Acute cystitis without hematuria    -  Primary; UA in the office positive for 3+ leukocytes. Send for culture, will treat with bactrim x 3 days.   ? Relevant  Medications  ? sulfamethoxazole-trimethoprim (BACTRIM DS) 800-160 MG tablet  ? Other Relevant Orders  ? POCT Urinalysis Dipstick (Completed)  ? Urine Culture  ? ?  ? ? ?Meds ordered this encounter  ?Medications  ? Semaglutide (RYBELSUS) 3 MG TABS  ?  Sig: Take 3 mg by mouth daily.  ?  Dispense:  30 tablet  ?  Refill:  1  ? sulfamethoxazole-trimethoprim (BACTRIM DS) 800-160 MG tablet  ?  Sig: Take 1 tablet by mouth 2 (two) times daily for 3 days.  ?  Dispense:  6 tablet  ?  Refill:  0  ? ? ?Return in about 4 weeks (around 07/20/2021) for already scheduled . ? ?Teodora Medici, DO ? ? ?

## 2021-06-22 NOTE — Progress Notes (Signed)
    Chronic Care Management Pharmacy Assistant   Name: VERDA BOWMAN  MRN: [id number] DOB: [birth year]  Reason for Encounter: Medication Review/Patient assistance application for Ozempic.  Recent office visits:  None ID  Recent consult visits:  None ID  Hospital visits:  None in previous 6 months  Medications: Outpatient Encounter Medications as of 06/22/2021  Medication Sig Note   Ascorbic Acid (VITAMIN C) 1000 MG tablet Take 1,000 mg by mouth daily.    atorvastatin (LIPITOR) 80 MG tablet TAKE 1 TABLET (80 MG) BY MOUTH AT BEDTIME    Calcium Carbonate-Vitamin D (CALTRATE 600+D PO) Take by mouth daily.    chlorthalidone (HYGROTON) 25 MG tablet Take 1 tablet (25 mg total) by mouth daily. (Patient taking differently: Take 25 mg by mouth daily. Pt taking QHS)    clopidogrel (PLAVIX) 75 MG tablet Take 75 mg by mouth daily. Pt taking QHS 06/22/2021: Prescribed by Dr. Manuella Ghazi   DULoxetine (CYMBALTA) 30 MG capsule Take 30 mg by mouth at bedtime. 06/22/2021: Prescribed by Dr. Posey Pronto   lisinopril (ZESTRIL) 10 MG tablet Take 1 tablet (10 mg total) by mouth daily. (Patient taking differently: Take 10 mg by mouth daily. Pt taking QHS)    metFORMIN (GLUCOPHAGE) 1000 MG tablet Take 0.5 tablets (500 mg total) by mouth 2 (two) times daily with a meal.    Multiple Vitamin (MULTIVITAMIN) tablet Take 1 tablet by mouth daily. Pt taking QHS    Semaglutide,0.25 or 0.5MG /DOS, (OZEMPIC, 0.25 OR 0.5 MG/DOSE,) 2 MG/1.5ML SOPN Inject 0.5 mg into the skin once a week. 06/22/2021: Still taking Ozempic 0.25 mg weekly   TURMERIC PO Take 1 capsule by mouth at bedtime.    vitamin B-12 (CYANOCOBALAMIN) 100 MCG tablet Take by mouth daily. Pt taking 1000 mcg daily QHS    No facility-administered encounter medications on file as of 06/22/2021.   I received a task from Junius Argyle, CPP requesting that I start the application for patient assistance on the medication Ozempic.    Clinical pharmacist spoke with the patient and  she  requested that the application be place at the front desk for her to pick up.Paitent will need to complete her part of the application and return it to her PCP office for Junius Argyle, CPP to fax over to [employer] for processing.The application will need to be faxed to [phone] and the phone number that can be called to check the status of the application will be [phone].    Application emailed to Junius Argyle, CPP for review and  for patient to pick up at the front desk.  [place] Pharmacist Assistant [phone]

## 2021-06-23 ENCOUNTER — Telehealth: Payer: Self-pay

## 2021-06-23 NOTE — Progress Notes (Signed)
Patient declined filling out the PAP because she thinks she made too much money. She left before I could speak with her . Can you check in with her tomorrow and let her know if that she and her husband make under $80,000 she likely qualifies for help with her ozempic if she would like it .  Patient states she will like to applied for Ozempic as her and her husband does not make nothing near $80,000. Patient reports she is going on vacation next week 06/27/2021 to 07/01/2021, and will come by the office to pick up the form either on  07/06/2021 or 07/07/2021. Notified clinical pharmacist.  Everlean Cherry Clinical Pharmacist Assistant [phone]

## 2021-06-24 LAB — URINE CULTURE
MICRO NUMBER:: 13412094
SPECIMEN QUALITY:: ADEQUATE

## 2021-07-05 NOTE — Patient Instructions (Signed)
Visit Information It was great speaking with you today!  Please let me know if you have any questions about our visit.   Goals Addressed             This Visit's Progress    Monitor and Manage My Blood Sugar-Diabetes Type 2       Timeframe:  Long-Range Goal Priority:  High Start Date: 07/05/21                            Expected End Date: 07/06/22                      Follow Up within 90 days   - check blood sugar at prescribed times - check blood sugar if I feel it is too high or too low - enter blood sugar readings and medication or insulin into daily log - take the blood sugar log to all doctor visits    Why is this important?   Checking your blood sugar at home helps to keep it from getting very high or very low.  Writing the results in a diary or log helps the doctor know how to care for you.  Your blood sugar log should have the time, date and the results.  Also, write down the amount of insulin or other medicine that you take.  Other information, like what you ate, exercise done and how you were feeling, will also be helpful.     Notes:         Patient Care Plan: General Pharmacy (Adult)     Problem Identified: Hypertension, Hyperlipidemia, Diabetes, Depression, Osteopenia, Osteoarthritis, Overactive Bladder, and Alzheimer's Dementia   Priority: High     Long-Range Goal: Patient-Specific Goal   Start Date: 07/05/2021  Expected End Date: 07/06/2022  This Visit's Progress: On track  Priority: High  Note:   Current Barriers:  Unable to independently afford treatment regimen Unable to achieve control of diabetes   Pharmacist Clinical Goal(s):  Patient will verbalize ability to afford treatment regimen achieve control of diabetes as evidenced by A1c less 8% through collaboration with PharmD and provider.   Interventions: 1:1 collaboration with Margarita Mail, DO regarding development and update of comprehensive plan of care as evidenced by provider  attestation and co-signature Inter-disciplinary care team collaboration (see longitudinal plan of care) Comprehensive medication review performed; medication list updated in electronic medical record  Hypertension (BP goal <140/90) -Controlled -Current treatment: Chlorthalidone 25 mg daily: Appropriate, Effective, Safe, Accessible Takes nightly, denies nocturia  Lisinopril 10 mg daily: Appropriate, Effective, Safe, Accessible  -Medications previously tried: NA  -Current home readings: Does not monitor regularly.  -Denies hypotensive/hypertensive symptom -Recommended to continue current medication  Hyperlipidemia: (LDL goal < 70) -Controlled -Potential TIA Sep 2021  -Current treatment: Atorvastatin 80 mg daily: Appropriate, Effective, Safe, Accessible  -Current antiplatelet treatment: Clopidogrel 75 mg daily: Appropriate, Effective, Safe, Accessible -Medications previously tried: NA  -Bruises easily, one instance of hematuria a few months prior. Otherwise tolerated clopidogrel well. -Recommended to continue current medication  Diabetes (A1c goal <8%) -Uncontrolled -Complications: frequent UTIs  -Current medications: Metformin 500 mg twice daily: Appropriate, Query effective Ozempic 0.25 mg weekly (Fridays): Appropriate, Query effective Affordability concerns with Ozempic cost. -Medications previously tried: Trulicity (GI Upset), Jardiance (Yeast infections), Rybelsus (Cost)  -Current home glucose readings fasting glucose: 120s-130s  post prandial glucose: 80 -Reports hypoglycemic/hyperglycemic symptoms: shaky  -Current meal patterns: Significant appetite reduction with Ozempic.  Has lost 6 pounds.  lunch: Skips sometimes or will have light snacks  drinks: Diet coke (2 bottles), Unsweetened tea with lunch, water -Recommended to continue current medication -START PAP for Ozempic  Osteopenia (Goal Prevent fractures) -Controlled -Last DEXA Scan: 09/14/20   T-Score femoral neck:  -1.9  T-Score total hip: -1.2  T-Score lumbar spine: -1.9  10-year probability of major osteoporotic fracture: 11.2%  10-year probability of hip fracture: 2.0% -Patient is not a candidate for pharmacologic treatment -Current treatment  Calcium + D3 daily -Medications previously tried: NA  -Recommended to continue current medication  Patient Goals/Self-Care Activities Patient will:  - check glucose daily before breakfast, document, and provide at future appointments  Follow Up Plan: Telephone follow up appointment with care management team member scheduled for:  10/05/2021 at 3:00 PM    Ms. Dittmer was given information about Chronic Care Management services today including:  CCM service includes personalized support from designated clinical staff supervised by her physician, including individualized plan of care and coordination with other care providers 24/7 contact phone numbers for assistance for urgent and routine care needs. Standard insurance, coinsurance, copays and deductibles apply for chronic care management only during months in which we provide at least 20 minutes of these services. Most insurances cover these services at 100%, however patients may be responsible for any copay, coinsurance and/or deductible if applicable. This service may help you avoid the need for more expensive face-to-face services. Only one practitioner may furnish and bill the service in a calendar month. The patient may stop CCM services at any time (effective at the end of the month) by phone call to the office staff.  Patient agreed to services and verbal consent obtained.   Patient verbalizes understanding of instructions and care plan provided today and agrees to view in MyChart. Active MyChart status and patient understanding of how to access instructions and care plan via MyChart confirmed with patient.     Cheyenne Adas, CPP Clinical Pharmacist Practitioner  [facility] [phone]

## 2021-07-07 ENCOUNTER — Telehealth: Payer: Self-pay

## 2021-07-07 DIAGNOSIS — E1169 Type 2 diabetes mellitus with other specified complication: Secondary | ICD-10-CM

## 2021-07-07 MED ORDER — BLOOD GLUCOSE MONITOR KIT: PACK | 0 refills | Status: DC

## 2021-07-07 NOTE — Progress Notes (Signed)
Per Clinical pharmacist, reach out to patient and see if she agrees to complete a LIS application to help with her medications. If so completed the LIS application online over the phone.  Patient agreed to complete LIS application online over the phone.Patient was inform to reach [phone] for a update as I can not due to it is [employer] farm manager. Patient Re-Entry number is [id number].Patient is aware to return my call once she is inform if she is approve or denied.  Patient states she will come by her PCP office on 07/08/2021 to pick up the patient assistance application for Ozempic.Patient is requesting a prescription for a glucose monitor and everything she would need to check  her blood sugar at home.Patient confirm pharmacy is [facility] in [place] clinical pharmacist of all the above.   [place] Pharmacist Assistant [phone]

## 2021-07-07 NOTE — Addendum Note (Signed)
Addended by: Julious Payer A on: 07/07/2021 04:45 PM   Modules accepted: Orders

## 2021-07-13 ENCOUNTER — Ambulatory Visit: Payer: Self-pay

## 2021-07-13 DIAGNOSIS — E1169 Type 2 diabetes mellitus with other specified complication: Secondary | ICD-10-CM

## 2021-07-13 NOTE — Progress Notes (Signed)
[place] Patient Assistance form for Ozempic completed by patient and faxed for review on 07/13/21

## 2021-07-18 MED ORDER — GLIPIZIDE 5 MG PO TABS: 5.0000 mg | ORAL_TABLET | Freq: Every day | ORAL | 3 refills | Status: DC

## 2021-07-19 ENCOUNTER — Telehealth: Payer: Self-pay

## 2021-07-19 NOTE — Progress Notes (Cosign Needed)
    Chronic Care Management Pharmacy Assistant   Name: DANIAL SISLEY  MRN: [id number] DOB: 1951/01/04  Reason for Encounter: Medication Review/Patient assistance application update for Ozempic.  Recent office visits:  None ID  Recent consult visits:  None ID  Hospital visits:  None in previous 6 months  Medications: Outpatient Encounter Medications as of 07/19/2021  Medication Sig Note   Ascorbic Acid (VITAMIN C) 1000 MG tablet Take 1,000 mg by mouth daily.    atorvastatin (LIPITOR) 80 MG tablet TAKE 1 TABLET (80 MG) BY MOUTH AT BEDTIME    blood glucose meter kit and supplies KIT Dispense based on patient and insurance preference. Use to monitor blood sugars once daily as directed.    Calcium Carbonate-Vitamin D (CALTRATE 600+D PO) Take by mouth daily.    chlorthalidone (HYGROTON) 25 MG tablet Take 1 tablet (25 mg total) by mouth daily. (Patient taking differently: Take 25 mg by mouth daily. Pt taking QHS)    clopidogrel (PLAVIX) 75 MG tablet Take 75 mg by mouth daily. Pt taking QHS 06/22/2021: Prescribed by Dr. Manuella Ghazi   DULoxetine (CYMBALTA) 30 MG capsule Take 30 mg by mouth at bedtime. 06/22/2021: Prescribed by Dr. Posey Pronto   glipiZIDE (GLUCOTROL) 5 MG tablet Take 1 tablet (5 mg total) by mouth daily before breakfast.    lisinopril (ZESTRIL) 10 MG tablet Take 1 tablet (10 mg total) by mouth daily. (Patient taking differently: Take 10 mg by mouth daily. Pt taking QHS)    metFORMIN (GLUCOPHAGE) 1000 MG tablet Take 0.5 tablets (500 mg total) by mouth 2 (two) times daily with a meal.    Multiple Vitamin (MULTIVITAMIN) tablet Take 1 tablet by mouth daily. Pt taking QHS    Semaglutide (RYBELSUS) 3 MG TABS Take 3 mg by mouth daily.    TURMERIC PO Take 1 capsule by mouth at bedtime.    vitamin B-12 (CYANOCOBALAMIN) 100 MCG tablet Take by mouth daily. Pt taking 1000 mcg daily QHS    No facility-administered encounter medications on file as of 07/19/2021.    I reach out to [employer] to  check the status of patient assistance application for Ozempic. Per [employer],  patient was approved for [employer] and she will need to reenroll next year around 07/18/2022 because she does not Medicare part  D. Patient shipment will be ship to provider office in 10-14 days.   Patient is aware and verbalized understanding.Patient states she took her glipizide at 7am this morning, ate a bagel and around 9 am she check her blood sugar and it was 53. Patient denies any hypoglycemia symptoms, but states she is not sure if it the glipizide but she is going to talk with her PCP about it on the 27  of this  month.Patient asking after she starts taking the ozempic can she stop the glipizide. Notified Clinical pharmacist.  Per clinical pharmacist ,She should take glipizide with food or immediately before a meal.  Per note on 06/22/2021, PCP recommended not to take anymore Glipizide.Per Clinical Pharmacist, Please instruct her to discontinue glipizide.   Patient verbalized understanding.   [place] Pharmacist Assistant [phone]

## 2021-07-20 ENCOUNTER — Other Ambulatory Visit: Payer: Self-pay | Admitting: Family Medicine

## 2021-07-20 DIAGNOSIS — E11649 Type 2 diabetes mellitus with hypoglycemia without coma: Secondary | ICD-10-CM

## 2021-07-20 DIAGNOSIS — I10 Essential (primary) hypertension: Secondary | ICD-10-CM

## 2021-07-28 ENCOUNTER — Ambulatory Visit: Payer: Medicare Other | Admitting: Internal Medicine

## 2021-08-02 ENCOUNTER — Encounter: Payer: Self-pay | Admitting: Internal Medicine

## 2021-08-02 ENCOUNTER — Ambulatory Visit (INDEPENDENT_AMBULATORY_CARE_PROVIDER_SITE_OTHER): Payer: Medicare Other | Admitting: Internal Medicine

## 2021-08-02 VITALS — BP 120/68 | HR 85 | Temp 98.2°F | Resp 16 | Ht 64.0 in | Wt 161.6 lb

## 2021-08-02 DIAGNOSIS — R0989 Other specified symptoms and signs involving the circulatory and respiratory systems: Secondary | ICD-10-CM | POA: Diagnosis not present

## 2021-08-02 DIAGNOSIS — E1169 Type 2 diabetes mellitus with other specified complication: Secondary | ICD-10-CM | POA: Diagnosis not present

## 2021-08-02 DIAGNOSIS — E669 Obesity, unspecified: Secondary | ICD-10-CM

## 2021-08-02 LAB — POCT GLYCOSYLATED HEMOGLOBIN (HGB A1C): Hemoglobin A1C: 7.1 % — AB (ref 4.0–5.6)

## 2021-08-03 ENCOUNTER — Encounter: Payer: Self-pay | Admitting: Internal Medicine

## 2021-08-03 ENCOUNTER — Ambulatory Visit
Admission: RE | Admit: 2021-08-03 | Discharge: 2021-08-03 | Disposition: A | Discharge status: Home / Self Care | Payer: Medicare Other | Source: Ambulatory Visit | Attending: Internal Medicine | Admitting: Internal Medicine

## 2021-08-03 ENCOUNTER — Ambulatory Visit: Payer: Medicare Other

## 2021-08-03 DIAGNOSIS — M7989 Other specified soft tissue disorders: Secondary | ICD-10-CM | POA: Diagnosis not present

## 2021-08-03 DIAGNOSIS — M79604 Pain in right leg: Secondary | ICD-10-CM | POA: Diagnosis not present

## 2021-08-03 DIAGNOSIS — R0989 Other specified symptoms and signs involving the circulatory and respiratory systems: Secondary | ICD-10-CM | POA: Diagnosis not present

## 2021-08-22 DIAGNOSIS — J069 Acute upper respiratory infection, unspecified: Secondary | ICD-10-CM | POA: Diagnosis not present

## 2021-08-23 ENCOUNTER — Encounter: Payer: Self-pay | Admitting: Internal Medicine

## 2021-08-23 ENCOUNTER — Ambulatory Visit (INDEPENDENT_AMBULATORY_CARE_PROVIDER_SITE_OTHER): Payer: Medicare Other | Admitting: Internal Medicine

## 2021-08-23 VITALS — BP 116/64 | HR 89 | Temp 97.7°F | Resp 16 | Ht 64.0 in | Wt 159.1 lb

## 2021-08-23 DIAGNOSIS — R051 Acute cough: Secondary | ICD-10-CM

## 2021-08-23 DIAGNOSIS — J069 Acute upper respiratory infection, unspecified: Secondary | ICD-10-CM

## 2021-08-23 MED ORDER — BENZONATATE 100 MG PO CAPS: 100.0000 mg | ORAL_CAPSULE | Freq: Two times a day (BID) | ORAL | 0 refills | Status: DC | PRN

## 2021-08-23 NOTE — Progress Notes (Signed)
Acute Office Visit  Subjective:     Patient ID: Sandy Salinas, female    DOB: 02-08-1950, 71 y.o.   MRN: [id number]  Chief Complaint  Patient presents with   Sinusitis    Went to urgent care yesterday given antibiotic    Sinusitis Associated symptoms include congestion, coughing, ear pain and shortness of breath. Pertinent negatives include no chills, headaches or sore throat.   Patient is in today for URI symptoms. Symptoms started 5 days ago.  URI Compliant:  -Worst symptom: fatigue, cough  -Fever: yes - 99.9 elevated temperature  -Cough: yes dry -Shortness of breath: yes -Wheezing: no -Chest pain: no -Chest tightness: no -Chest congestion: no -Nasal congestion: yes -Runny nose: yes clear -Post nasal drip: no -Sneezing: no -Sore throat: no -Swollen glands: no -Sinus pressure: no -Headache: no -Face pain: yes -Toothache: yes -Ear pain: yes bilateral -Ear pressure: yes bilateral  -Vomiting: yes - 3 days because of coughing  -Fatigue: yes -Sick contacts: yes - husband with similar symptoms, started afterwards. Friend also sick  -Context: worse -Treatments attempted: cold/sinus and mucinex  -Started Augmentin 875-125 mg BID x 7 days prescribed by Urgent Care, started yesterday. Doesn't feel any different since starting it.  Review of Systems  Constitutional:  Negative for chills and fever.  HENT:  Positive for congestion, ear pain and sinus pain. Negative for sore throat.   Respiratory:  Positive for cough and shortness of breath. Negative for sputum production and wheezing.   Cardiovascular:  Negative for chest pain.  Gastrointestinal:  Negative for abdominal pain, diarrhea, nausea and vomiting.  Neurological:  Negative for dizziness and headaches.        Objective:    BP 116/64   Pulse 89   Temp 97.7 F (36.5 C)   Resp 16   Ht 5\' 4"  (1.626 m)   Wt 159 lb 1.6 oz (72.2 kg)   SpO2 98%   BMI 27.31 kg/m  BP Readings from Last 3 Encounters:   08/23/21 116/64  08/02/21 120/68  06/22/21 116/78   Wt Readings from Last 3 Encounters:  08/23/21 159 lb 1.6 oz (72.2 kg)  08/02/21 161 lb 9.6 oz (73.3 kg)  06/22/21 159 lb 3.2 oz (72.2 kg)    Physical Exam Constitutional:      Appearance: Normal appearance.  HENT:     Head: Normocephalic and atraumatic.     Right Ear: Tympanic membrane, ear canal and external ear normal.     Left Ear: Tympanic membrane, ear canal and external ear normal.     Nose: Congestion present.     Mouth/Throat:     Mouth: Mucous membranes are moist.     Comments: Post nasal drip present  Eyes:     Conjunctiva/sclera: Conjunctivae normal.  Cardiovascular:     Rate and Rhythm: Normal rate and regular rhythm.  Pulmonary:     Effort: Pulmonary effort is normal.     Breath sounds: Normal breath sounds. No wheezing, rhonchi or rales.  Musculoskeletal:     Right lower leg: No edema.     Left lower leg: No edema.  Lymphadenopathy:     Cervical: No cervical adenopathy.  Skin:    General: Skin is warm and dry.  Neurological:     General: No focal deficit present.     Mental Status: She is alert. Mental status is at baseline.  Psychiatric:        Mood and Affect: Mood normal.  Behavior: Behavior normal.     No results found for any visits on 08/23/21.      Assessment & Plan:   1. Upper respiratory tract infection, unspecified type/Acute cough: Symptoms most likely viral, had tested negative for COVID at home. Retest today. Had multiple COVID vaccines and boosters. Recommend rest, staying well hydrated and otherwise treat symptomatically. Will send cough suppressant to pharmacy and she was given Ryaltris nasal spray sample. Can continue Augmentin until test results. Recommend quarantine until test results. Follow up if symptoms worsen or fail to improve.   - benzonatate (TESSALON) 100 MG capsule; Take 1 capsule (100 mg total) by mouth 2 (two) times daily as needed for cough.  Dispense: 20  capsule; Refill: 0 - Novel Coronavirus, NAA ([facility])   Return if symptoms worsen or fail to improve.  Margarita Mail, DO

## 2021-08-23 NOTE — Patient Instructions (Addendum)
It was great seeing you today!  Plan discussed at today's visit: -COVID test today -Stop Mucinex, can continue Augmentin until we get results back -use nasal spray 2 sprays on each side twice a day -Cough medication sent to pharmacy -Wear a mask in public or around any immunocompromised people until 7/23 -If oxygen levels <88% and not coming up, please go to the ER  Follow up in: as needed   Take care and let us know if you have any questions or concerns prior to your next visit.  Dr. Caralee Ates

## 2021-08-24 LAB — SPECIMEN STATUS REPORT

## 2021-08-24 LAB — NOVEL CORONAVIRUS, NAA: SARS-CoV-2, NAA: DETECTED — AB

## 2021-09-07 DIAGNOSIS — H6981 Other specified disorders of Eustachian tube, right ear: Secondary | ICD-10-CM | POA: Diagnosis not present

## 2021-09-07 DIAGNOSIS — H60333 Swimmer's ear, bilateral: Secondary | ICD-10-CM | POA: Diagnosis not present

## 2021-09-07 DIAGNOSIS — H903 Sensorineural hearing loss, bilateral: Secondary | ICD-10-CM | POA: Diagnosis not present

## 2021-09-15 ENCOUNTER — Ambulatory Visit (INDEPENDENT_AMBULATORY_CARE_PROVIDER_SITE_OTHER): Payer: Medicare Other

## 2021-09-15 DIAGNOSIS — Z0000 Encounter for general adult medical examination without abnormal findings: Secondary | ICD-10-CM

## 2021-09-15 DIAGNOSIS — Z1231 Encounter for screening mammogram for malignant neoplasm of breast: Secondary | ICD-10-CM | POA: Diagnosis not present

## 2021-09-15 NOTE — Progress Notes (Signed)
Subjective:  I connected with  Sandy Salinas on 09/15/21 by a audio enabled telemedicine application and verified that I am speaking with the correct person using two identifiers.  Patient Location: Home  Provider Location: Office/Clinic  I discussed the limitations of evaluation and management by telemedicine. The patient expressed understanding and agreed to proceed.  Sandy Salinas is a 71 y.o. female who presents for Medicare Annual (Subsequent) preventive examination.  Review of Systems    Defer to PCP       Objective:    There were no vitals filed for this visit. There is no height or weight on file to calculate BMI.     03/17/2021    9:39 PM 03/17/2021    6:11 PM 01/16/2021    3:22 PM 09/14/2020    9:41 AM 11/21/2019    2:03 PM 08/19/2019    3:13 PM 11/14/2016    9:53 AM  Advanced Directives  Does Patient Have a Medical Advance Directive? No No No Yes No No Yes  Type of Scientist, research (medical);Living will     Copy of [place] in Chart?    No - copy requested     Would patient like information on creating a medical advance directive? No - Patient declined  No - Patient declined  No - Patient declined Yes (MAU/Ambulatory/Procedural Areas - Information given)     Current Medications (verified) Outpatient Encounter Medications as of 09/15/2021  Medication Sig   amoxicillin-clavulanate (AUGMENTIN) 875-125 MG tablet SMARTSIG:1 Tablet(s) By Mouth Every 12 Hours   Ascorbic Acid (VITAMIN C) 1000 MG tablet Take 1,000 mg by mouth daily.   atorvastatin (LIPITOR) 80 MG tablet TAKE 1 TABLET (80 MG) BY MOUTH AT BEDTIME   benzonatate (TESSALON) 100 MG capsule Take 1 capsule (100 mg total) by mouth 2 (two) times daily as needed for cough.   blood glucose meter kit and supplies KIT Dispense based on patient and insurance preference. Use to monitor blood sugars once daily as directed.   Calcium Carbonate-Vitamin D (CALTRATE 600+D PO) Take  by mouth daily.   chlorthalidone (HYGROTON) 25 MG tablet TAKE 1 TABLET(25 MG) BY MOUTH DAILY   clopidogrel (PLAVIX) 75 MG tablet Take 75 mg by mouth daily. Pt taking QHS   DULoxetine (CYMBALTA) 30 MG capsule Take 30 mg by mouth at bedtime.   lisinopril (ZESTRIL) 10 MG tablet TAKE 1 TABLET(10 MG) BY MOUTH DAILY   metFORMIN (GLUCOPHAGE) 1000 MG tablet Take 0.5 tablets (500 mg total) by mouth 2 (two) times daily with a meal.   Multiple Vitamin (MULTIVITAMIN) tablet Take 1 tablet by mouth daily. Pt taking QHS   Semaglutide (OZEMPIC, 0.25 OR 0.5 MG/DOSE, [place]) Inject 0.25 mg into the skin once a week.   TURMERIC PO Take 1 capsule by mouth at bedtime.   vitamin B-12 (CYANOCOBALAMIN) 100 MCG tablet Take by mouth daily. Pt taking 1000 mcg daily QHS   No facility-administered encounter medications on file as of 09/15/2021.    Allergies (verified) Codeine and Jardiance [empagliflozin]   History: Past Medical History:  Diagnosis Date   Abdominal aortic ectasia ([place]) 05/19/2016   [place] April 2018, rescan in five years (April 2023)   Breast calcification seen on mammogram 12/01/2016   LEFT   Cataract    Depression 03/18/2021   Diabetes mellitus without complication [facility])    Family history of abdominal aortic aneurysm (AAA) 05/12/2016   father   Hyperlipidemia  Hypertension    Lumbar radiculopathy, acute    Microalbuminuria due to type 2 diabetes mellitus ([place])    Obesity    Osteoporosis    Primary osteoarthritis of left knee    Psoriasis, unspecified    Pyelonephritis 03/17/2021   Seizures ([place])    Past Surgical History:  Procedure Laterality Date   ABDOMINAL HYSTERECTOMY  1975   total   BREAST BIOPSY Right 04/09/2012   x 2, FIBROADENOMA WITH COARSE CALCIFICATIONS.done by byrnett   BREAST BIOPSY Left 09/04/2017   Affirm Bx- X clip, FIBROADENOMATOUS CHANGE WITH CALCIFICATIONS. NEGATIVE FOR ATYPIA AND MALIGNANCY   CYSTECTOMY  2003   ROUX-EN-Y GASTRIC BYPASS  2009   TONSILLECTOMY  1970    TUBAL LIGATION  1975   Family History  Problem Relation Age of Onset   Alzheimer's disease Mother    Heart murmur Mother    Dementia Mother    Arthritis Mother    AAA (abdominal aortic aneurysm) Father    Heart disease Father    Asthma Sister    Hypertension Sister    Cancer Sister        breast   Breast cancer Sister        late 90's   Heart disease Brother    Hypertension Brother    Aneurysm Paternal Aunt    Lung cancer Paternal Uncle    Cancer Paternal Grandmother        possible, not sure what kind   Cancer Cousin    Social History   Socioeconomic History   Marital status: Married    Spouse name: Doctor, general practice   Number of children: 2   Years of education: Not on file   Highest education level: Some college, no degree  Occupational History   Occupation: retired  Tobacco Use   Smoking status: Never   Smokeless tobacco: Never  Vaping Use   Vaping Use: Never used  Substance and Sexual Activity   Alcohol use: No   Drug use: No   Sexual activity: Not Currently    Birth control/protection: Abstinence    Comment: Old Age  Other Topics Concern   Not on file  Social History Narrative   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    Social Determinants of Health   Financial Resource Strain: High Risk (07/05/2021)   Overall Financial Resource Strain (CARDIA)    Difficulty of Paying Living Expenses: Hard  Food Insecurity: No Food Insecurity (09/14/2020)   Hunger Vital Sign    Worried About Running Out of Food in the Last Year: Never true    Ran Out of Food in the Last Year: Never true  Transportation Needs: No Transportation Needs (07/05/2021)   PRAPARE - Hydrologist (Medical): No    Lack of Transportation (Non-Medical): No  Physical Activity: Inactive (09/14/2020)   Exercise Vital Sign    Days of Exercise per Week: 0 days    Minutes of Exercise per Session: 0 min  Stress: No Stress Concern Present (09/14/2020)   [place]    Feeling of Stress : Not at all  Social Connections: Moderately Isolated (09/14/2020)   Social Connection and Isolation Panel [NHANES]    Frequency of Communication with Friends and Family: More than three times a week    Frequency of Social Gatherings with Friends and Family: More than three times a week    Attends Religious Services: Never    Active Member  of Clubs or Organizations: No    Attends Archivist Meetings: Never    Marital Status: Married    Tobacco Counseling Counseling given: Not Answered   Clinical Intake:                 Diabetic?Yes         Activities of Daily Living    08/23/2021   11:33 AM 08/02/2021    3:42 PM  In your present state of health, do you have any difficulty performing the following activities:  Hearing? 1 1  Vision? 0 0  Difficulty concentrating or making decisions? 1 1  Walking or climbing stairs? 0 0  Dressing or bathing? 0 0  Doing errands, shopping? 0 0    Patient Care Team: Teodora Medici, DO as PCP - General (Internal Medicine) Duke Salvia Venia Carbon, MD (Bariatrics) Annie Sable, Gaetana Michaelis, MD (Dermatology) Solum, Betsey Holiday, MD as Physician Assistant (Endocrinology) Quintin Alto, MD as Consulting Physician (Rheumatology) Vladimir Crofts, MD as Consulting Physician (Neurology) Yolonda Kida, MD as Consulting Physician (Cardiology) Germaine Pomfret, [facility] (Pharmacist)  Indicate any recent Medical Services you may have received from other than [facility] providers in the past year (date may be approximate).     Assessment:   This is a routine wellness examination for [place].  Hearing/Vision screen No results found.  Dietary issues and exercise activities discussed:     Goals Addressed   None   Depression Screen    08/23/2021   11:33 AM 08/02/2021    3:41 PM 06/22/2021    2:52 PM 06/17/2021    1:50 PM 04/26/2021    2:35 PM 04/11/2021    9:53 AM  03/15/2021    9:01 AM  PHQ 2/9 Scores  PHQ - 2 Score 0 0 0 0 0 0 0  PHQ- 9 Score 0 0 0 0 0  0    Fall Risk    08/23/2021   11:33 AM 08/02/2021    3:41 PM 06/22/2021    2:51 PM 06/17/2021    1:50 PM 04/26/2021    2:35 PM  Fall Risk   Falls in the past year? 1 1 0 1 0  Number falls in past yr: 1 1 0 0 0  Injury with Fall? 1 1 0 1 0  Risk for fall due to :  Impaired mobility;Impaired balance/gait       FALL RISK PREVENTION PERTAINING TO THE HOME:  Any stairs in or around the home? Yes  If so, are there any without handrails? Yes  Home free of loose throw rugs in walkways, pet beds, electrical cords, etc? Yes  Adequate lighting in your home to reduce risk of falls? Yes   ASSISTIVE DEVICES UTILIZED TO PREVENT FALLS:  Life alert? No  Use of a cane, walker or w/c? No  Grab bars in the bathroom? Yes  Shower chair or bench in shower? No  Elevated toilet seat or a handicapped toilet? No   TIMED UP AND GO:  Was the test performed? No .  Length of time to ambulate 10 feet: N/A sec.     Cognitive Function:        08/19/2019    3:16 PM 05/15/2017    8:39 AM  6CIT Screen  What Year? 0 points 0 points  What month? 0 points 0 points  What time? 0 points 0 points  Count back from 20 0 points 0 points  Months in reverse 0 points 0 points  Repeat phrase 0 points 0 points  Total Score 0 points 0 points    Immunizations Immunization History  Administered Date(s) Administered   Influenza, High Dose Seasonal PF 11/14/2016, 09/24/2018   Influenza, Seasonal, Injecte, Preservative Fre 01/10/2007   Influenza,inj,Quad PF,6+ Mos 10/14/2015   Influenza-Unspecified 09/06/2013, 09/26/2019, 10/21/2020   PFIZER Comirnaty(Gray Top)Covid-19 Tri-Sucrose Vaccine 02/27/2019, 03/20/2019   PFIZER(Purple Top)SARS-COV-2 Vaccination 02/27/2019, 03/20/2019, 11/03/2019, 05/04/2020, 10/21/2020   Pneumococcal Conjugate-13 11/14/2016   Pneumococcal Polysaccharide-23 11/20/2017   Td 02/07/2004   Tdap  04/14/2016    TDAP status: Up to date  Flu Vaccine status: Up to date  Pneumococcal vaccine status: Up to date  Covid-19 vaccine status: Completed vaccines  Qualifies for Shingles Vaccine? Yes   Zostavax completed No   Shingrix Completed?: No.    Education has been provided regarding the importance of this vaccine. Patient has been advised to call insurance company to determine out of pocket expense if they have not yet received this vaccine. Advised may also receive vaccine at local pharmacy or Health Dept. Verbalized acceptance and understanding.  Screening Tests Health Maintenance  Topic Date Due   Zoster Vaccines- Shingrix (1 of 2) Never done   COVID-19 Vaccine (8 - Pfizer risk series) 12/16/2020   INFLUENZA VACCINE  09/06/2021   MAMMOGRAM  10/18/2021   FOOT EXAM  12/14/2021   HEMOGLOBIN A1C  02/01/2022   OPHTHALMOLOGY EXAM  02/06/2022   Fecal DNA (Cologuard)  07/08/2023   TETANUS/TDAP  04/15/2026   Pneumonia Vaccine 92+ Years old  Completed   DEXA SCAN  Completed   Hepatitis C Screening  Completed   HPV VACCINES  Aged Out    Health Maintenance  Health Maintenance Due  Topic Date Due   Zoster Vaccines- Shingrix (1 of 2) Never done   COVID-19 Vaccine (8 - Pfizer risk series) 12/16/2020   INFLUENZA VACCINE  09/06/2021    Colorectal cancer screening: Type of screening: FOBT/FIT. Completed 07/07/2020. Repeat every 3 years  Mammogram status: Completed 09/14/2020. Repeat every year  Bone Density status: Completed 09/14/2020. Results reflect: Bone density results: NORMAL. Repeat every 5 years.  Lung Cancer Screening: (Low Dose CT Chest recommended if Age 57-80 years, 30 pack-year currently smoking OR have quit w/in 15years.) does not qualify.   Lung Cancer Screening Referral: N/A  Additional Screening:  Hepatitis C Screening: does not qualify; Completed 08/27/2018  Vision Screening: Recommended annual ophthalmology exams for early detection of glaucoma and other  disorders of the eye. Is the patient up to date with their annual eye exam?  Yes  Who is the provider or what is the name of the office in which the patient attends annual eye exams? 02/06/2021/DrEllin Mayhew If pt is not established with a provider, would they like to be referred to a provider to establish care? No .   Dental Screening: Recommended annual dental exams for proper oral hygiene  Community Resource Referral / Chronic Care Management: CRR required this visit?  No   CCM required this visit?  No      Plan:     I have personally reviewed and noted the following in the patient's chart:   Medical and social history Use of alcohol, tobacco or illicit drugs  Current medications and supplements including opioid prescriptions.  Functional ability and status Nutritional status Physical activity Advanced directives List of other physicians Hospitalizations, surgeries, and ER visits in previous 12 months Vitals Screenings to include cognitive, depression, and falls Referrals and appointments  In addition, I have  reviewed and discussed with patient certain preventive protocols, quality metrics, and best practice recommendations. A written personalized care plan for preventive services as well as general preventive health recommendations were provided to patient.     Royal Hawthorn, CMA   09/15/2021   Nurse Notes: Non Face to Face minutes spent 32.  Ms. Crislip , Thank you for taking time to come for your Medicare Wellness Visit. I appreciate your ongoing commitment to your health goals. Please review the following plan we discussed and let me know if I can assist you in the future.   These are the goals we discussed:  Goals      Monitor and Manage My Blood Sugar-Diabetes Type 2     Timeframe:  Long-Range Goal Priority:  High Start Date: 07/05/21                            Expected End Date: 07/06/22                      Follow Up within 90 days   - check blood sugar at  prescribed times - check blood sugar if I feel it is too high or too low - enter blood sugar readings and medication or insulin into daily log - take the blood sugar log to all doctor visits    Why is this important?   Checking your blood sugar at home helps to keep it from getting very high or very low.  Writing the results in a diary or log helps the doctor know how to care for you.  Your blood sugar log should have the time, date and the results.  Also, write down the amount of insulin or other medicine that you take.  Other information, like what you ate, exercise done and how you were feeling, will also be helpful.     Notes:      Weight (lb) < 170 lb (77.1 kg)        This is a list of the screening recommended for you and due dates:  Health Maintenance  Topic Date Due   Zoster (Shingles) Vaccine (1 of 2) Never done   COVID-19 Vaccine (8 - Pfizer risk series) 12/16/2020   Flu Shot  09/06/2021   Mammogram  10/18/2021   Complete foot exam   12/14/2021   Hemoglobin A1C  02/01/2022   Eye exam for diabetics  02/06/2022   Cologuard (Stool DNA test)  07/08/2023   Tetanus Vaccine  04/15/2026   Pneumonia Vaccine  Completed   DEXA scan (bone density measurement)  Completed   Hepatitis C Screening: USPSTF Recommendation to screen - Ages 60-79 yo.  Completed   HPV Vaccine  Aged Out

## 2021-09-19 ENCOUNTER — Other Ambulatory Visit: Payer: Self-pay | Admitting: Family Medicine

## 2021-09-19 DIAGNOSIS — Z1231 Encounter for screening mammogram for malignant neoplasm of breast: Secondary | ICD-10-CM

## 2021-09-20 DIAGNOSIS — M25562 Pain in left knee: Secondary | ICD-10-CM | POA: Diagnosis not present

## 2021-09-20 DIAGNOSIS — M1712 Unilateral primary osteoarthritis, left knee: Secondary | ICD-10-CM | POA: Diagnosis not present

## 2021-09-28 ENCOUNTER — Telehealth: Payer: Self-pay

## 2021-09-28 NOTE — Progress Notes (Addendum)
Per Clinical pharmacist, Please ask patient what dose of Ozempic she is using? She is due for a refill from [facility] [employer] and we filled out the form for the 0.5 mg weekly dose, but her chart says 0.25 mg. Can you confirm for me?   Patient confirms she is using 0.25 mg weekly of Ozempic. Notified Clinical pharmacist.   Per Clinical pharmacist, Please inform patient I spoke with Dr. Caralee Ates and we would like to have her increase her Ozempic with her next shipment to the 0.5 mg weekly dose to better help with lowering her blood sugars?   Patient verbalized understanding, and agreed with increasing her Ozempic to 0.5 mg weekly.  Everlean Cherry Clinical Pharmacist Assistant [phone]

## 2021-10-03 ENCOUNTER — Telehealth: Payer: Self-pay

## 2021-10-03 NOTE — Progress Notes (Signed)
I reach out to [employer] to Confirm they have received the updated reorder form for Ozempic which was to increase to 0.5 mg weekly.  Per [facility], they did received the the reorder form and prescription to increase Ozempic to 0.5 mg weekly. Patient should received shipment in October at her PCP Office.Patient end date is 07/15/2022.   Everlean Cherry Clinical Pharmacist Assistant [phone]

## 2021-10-04 ENCOUNTER — Telehealth: Payer: Self-pay

## 2021-10-04 NOTE — Progress Notes (Signed)
Chronic Care Mangement APPOINTMENT REMINDER   Sandy Salinas was reminded to have all medications, supplements and any blood glucose and blood pressure readings available for review with [name], Pharm. D, at her telephone visit on 10/05/2021 at 3:00 pm.  Patient confirm appointment., and states she has a year worth of supply of Ozempic on hand of 0.25 mg.Patient reports she will discuss this with the clinical pharmacist.   Everlean Cherry Clinical Pharmacist Assistant [phone]

## 2021-10-05 ENCOUNTER — Ambulatory Visit (INDEPENDENT_AMBULATORY_CARE_PROVIDER_SITE_OTHER): Payer: Medicare Other

## 2021-10-05 DIAGNOSIS — E669 Obesity, unspecified: Secondary | ICD-10-CM

## 2021-10-05 DIAGNOSIS — I10 Essential (primary) hypertension: Secondary | ICD-10-CM

## 2021-10-05 MED ORDER — OZEMPIC (0.25 OR 0.5 MG/DOSE) 2 MG/3ML ~~LOC~~ SOPN: 0.5000 mg | PEN_INJECTOR | SUBCUTANEOUS | Status: DC

## 2021-10-05 NOTE — Progress Notes (Signed)
Chronic Care Management Pharmacy Note  10/05/2021 Name:  Sandy Salinas MRN:  [id number] DOB:  10-Oct-1950  Summary: Patient presents for CCM follow-up.   Dry cough since COVID, otherwise doing well today. Home blood sugars and blood pressure controlled.   Recommendations/Changes made from today's visit: Increase Ozempic to 0.5 mg weekly   Plan: CPP follow-up 6 months  Subjective: Sandy Salinas is an 71 y.o. year old female who is a primary patient of Teodora Medici, DO.  The CCM team was consulted for assistance with disease management and care coordination needs.    Engaged with patient by telephone for follow up visit in response to provider referral for pharmacy case management and/or care coordination services.   Consent to Services:  The patient was given information about Chronic Care Management services, agreed to services, and gave verbal consent prior to initiation of services.  Please see initial visit note for detailed documentation.   Patient Care Team: Teodora Medici, DO as PCP - General (Internal Medicine) Duke Salvia Venia Carbon, MD ([place]) Annie Sable, Gaetana Michaelis, MD (Dermatology) Gabriel Carina, Betsey Holiday, MD as Physician Assistant (Endocrinology) Quintin Alto, MD as Consulting Physician (Rheumatology) Vladimir Crofts, MD as Consulting Physician (Neurology) Yolonda Kida, MD as Consulting Physician (Cardiology) Germaine Pomfret, [facility] (Pharmacist)  Recent office visits: 08/23/21: Patient presented to Dr. Rosana Berger for URI.  08/02/21: Patient presented to Dr. Rosana Berger for follow-up.  05/19/2021 Theodis Blaze DO (PCP)  Stop Rybelsus due to cost, Start Ozempic 0.5 mg weekly (Samples given)  04/27/2021 Theodis Blaze DO (PCP)  Start Rybelsus 7 mg , patient received 3 mg samples to start then will start 7 mg 04/26/2021 Theodis Blaze DO (PCP) Stop Gabapentin, Stop Naproxen, Stop Zofran, Stop Oxycodone, Stop Prednisone, Stop Tamsulosin, Stop  Tizanidine 04/19/2021 Serafina Royals FNP (PCP Office)  stop Vania Rea, start Diflucan 150 mg 04/11/2021 Serafina Royals FNP (PCP Office) Start naproxen 500 mg 2 times daily, start prednisone 10 mg taper pack, Start tizanidine 4 mg PRN,  Recent consult visits: 05/18/2021 Dr. Posey Pronto MD (Rheumatology) Injection triamcinolone acetonide 40 mg given in both hips, Stop Actos, Stop Oxybutynin, Return in 6 months  Hospital visits: Admitted to the hospital on 03/17/2021 due to Pyelonephristis. Discharge date was 03/18/2021. Discharged from [facility].   Admitted to the hospital on 01/16/2021 due to Injury of head. Discharge date was 01/16/2021. Discharged from [facility].    Objective:  Lab Results  Component Value Date   CREATININE 0.58 03/18/2021   BUN 13 03/18/2021   EGFR 79 02/24/2021   GFRNONAA >60 03/18/2021   GFRAA 104 09/23/2019   NA 137 03/18/2021   K 3.9 03/18/2021   CALCIUM 9.2 03/18/2021   CO2 29 03/18/2021   GLUCOSE 127 (H) 03/18/2021    Lab Results  Component Value Date/Time   HGBA1C 7.1 (A) 08/02/2021 03:48 PM   HGBA1C 8.5 (H) 04/26/2021 03:06 PM   HGBA1C 8.2 (A) 01/25/2021 03:12 PM   HGBA1C 8.3 (H) 10/21/2020 03:05 PM   MICROALBUR <0.2 10/21/2020 03:05 PM   MICROALBUR <0.2 04/27/2020 03:46 PM   MICROALBUR 10 02/18/2015 08:41 AM    Last diabetic Eye exam:  Lab Results  Component Value Date/Time   HMDIABEYEEXA No Retinopathy 11/01/2015 12:00 AM    Last diabetic Foot exam: No results found for: "HMDIABFOOTEX"   Lab Results  Component Value Date   CHOL 126 04/26/2021   HDL 33 (L) 04/26/2021   LDLCALC 67 04/26/2021   TRIG 184 (H) 04/26/2021  CHOLHDL 3.8 04/26/2021       Latest Ref Rng & Units 03/17/2021    8:07 PM 02/24/2021    2:07 PM 10/21/2020    3:05 PM  Hepatic Function  Total Protein 6.5 - 8.1 g/dL 6.9  6.7  6.6   Albumin 3.5 - 5.0 g/dL 3.6     AST 15 - 41 U/L '18  17  22   ' ALT 0 - 44 U/L '11  7  11   ' Alk Phosphatase 38 - 126 U/L 61      Total Bilirubin 0.3 - 1.2 mg/dL 0.6  0.3  0.3   Bilirubin, Direct 0.0 - 0.2 mg/dL 0.1       Lab Results  Component Value Date/Time   TSH 1.56 02/24/2021 02:07 PM   TSH 1.75 04/27/2020 03:46 PM       Latest Ref Rng & Units 03/18/2021    3:59 AM 03/17/2021    6:11 PM 02/24/2021    2:07 PM  CBC  WBC 4.0 - 10.5 K/uL 13.2  15.7  10.0   Hemoglobin 12.0 - 15.0 g/dL 12.5  12.4  12.8   Hematocrit 36.0 - 46.0 % 39.6  39.2  40.2   Platelets 150 - 400 K/uL 584  611  521     Lab Results  Component Value Date/Time   VD25OH 35 02/24/2021 02:07 PM   VD25OH 45 11/14/2016 10:44 AM    Clinical ASCVD: No  The ASCVD Risk score (Arnett DK, et al., 2019) failed to calculate for the following reasons:   The valid total cholesterol range is 130 to 320 mg/dL       09/15/2021    9:42 AM 09/15/2021    9:30 AM 08/23/2021   11:33 AM  Depression screen PHQ 2/9  Decreased Interest 0 0 0  Down, Depressed, Hopeless 0 0 0  PHQ - 2 Score 0 0 0  Altered sleeping 0 0 0  Tired, decreased energy 0 0 0  Change in appetite 0 0 0  Feeling bad or failure about yourself  0 0 0  Trouble concentrating 0 0 0  Moving slowly or fidgety/restless 0 0 0  Suicidal thoughts 0 0 0  PHQ-9 Score 0 0 0  Difficult doing work/chores   Not difficult at all    Social History   Tobacco Use  Smoking Status Never  Smokeless Tobacco Never   BP Readings from Last 3 Encounters:  08/23/21 116/64  08/02/21 120/68  06/22/21 116/78   Pulse Readings from Last 3 Encounters:  08/23/21 89  08/02/21 85  06/22/21 97   Wt Readings from Last 3 Encounters:  08/23/21 159 lb 1.6 oz (72.2 kg)  08/02/21 161 lb 9.6 oz (73.3 kg)  06/22/21 159 lb 3.2 oz (72.2 kg)   BMI Readings from Last 3 Encounters:  08/23/21 27.31 kg/m  08/02/21 27.74 kg/m  06/22/21 27.33 kg/m    Assessment/Interventions: Review of patient past medical history, allergies, medications, health status, including review of consultants reports, laboratory and  other test data, was performed as part of comprehensive evaluation and provision of chronic care management services.   SDOH:  (Social Determinants of Health) assessments and interventions performed: Yes   SDOH Screenings   Alcohol Screen: Low Risk  (04/11/2021)   Alcohol Screen    Last Alcohol Screening Score (AUDIT): 0  Depression (PHQ2-9): Low Risk  (09/15/2021)   Depression (PHQ2-9)    PHQ-2 Score: 0  Financial Resource Strain: High Risk (09/15/2021)  Overall Financial Resource Strain (CARDIA)    Difficulty of Paying Living Expenses: Hard  Food Insecurity: No Food Insecurity (09/15/2021)   Hunger Vital Sign    Worried About Running Out of Food in the Last Year: Never true    Ran Out of Food in the Last Year: Never true  Housing: Low Risk  (09/15/2021)   Housing    Last Housing Risk Score: 0  Physical Activity: Insufficiently Active (09/15/2021)   Exercise Vital Sign    Days of Exercise per Week: 4 days    Minutes of Exercise per Session: 30 min  Social Connections: Moderately Isolated (09/15/2021)   Social Connection and Isolation Panel [NHANES]    Frequency of Communication with Friends and Family: More than three times a week    Frequency of Social Gatherings with Friends and Family: More than three times a week    Attends Religious Services: Never    Marine scientist or Organizations: No    Attends Archivist Meetings: Never    Marital Status: Married  Stress: No Stress Concern Present (09/15/2021)   [employer] [place]    Feeling of Stress : Not at all  Tobacco Use: Low Risk  (09/15/2021)   Patient History    Smoking Tobacco Use: Never    Smokeless Tobacco Use: Never    Passive Exposure: Not on file  Transportation Needs: No Transportation Needs (09/15/2021)   PRAPARE - Transportation    Lack of Transportation (Medical): No    Lack of Transportation (Non-Medical): No    CCM Care Plan  Allergies   Allergen Reactions   Codeine Nausea Only and Other (See Comments)    Hallucinations   Jardiance [Empagliflozin] Other (See Comments)    Recurrent UTI, yeast infection    Medications Reviewed Today     Reviewed by Royal Hawthorn, CMA (Certified Medical Assistant) on 09/15/21 at [phone]  Med List Status: <None>   Medication Order Taking? Sig Documenting Provider Last Dose Status Informant  amoxicillin-clavulanate (AUGMENTIN) 875-125 MG tablet [id number]  SMARTSIG:1 Tablet(s) By Mouth Every 12 Hours [provider]  Active   Ascorbic Acid (VITAMIN C) 1000 MG tablet [id number]  Take 1,000 mg by mouth daily. [provider]  Active   atorvastatin (LIPITOR) 80 MG tablet [id number]  TAKE 1 TABLET (80 MG) BY MOUTH AT BEDTIME Delsa Grana, PA-C  Active Multiple Informants  benzonatate (TESSALON) 100 MG capsule [id number]  Take 1 capsule (100 mg total) by mouth 2 (two) times daily as needed for cough. Teodora Medici, DO  Active   blood glucose meter kit and supplies KIT [id number]  Dispense based on patient and insurance preference. Use to monitor blood sugars once daily as directed. Teodora Medici, DO  Active   Calcium Carbonate-Vitamin D (CALTRATE 600+D PO) [id number]  Take by mouth daily. [provider]  Active Multiple Informants           Med Note Alleen Borne   Tue Aug 19, 2019  3:01 PM)    chlorthalidone (HYGROTON) 25 MG tablet [id number]  TAKE 1 TABLET(25 MG) BY MOUTH DAILY Teodora Medici, DO  Active   clopidogrel (PLAVIX) 75 MG tablet [id number]  Take 75 mg by mouth daily. Pt taking QHS [provider]  Active Multiple Informants           Med Note Germaine Pomfret   Wed Jun 22, 2021 10:13 AM) Prescribed by Dr. Manuella Ghazi  DULoxetine (CYMBALTA) 30  MG capsule [id number]  Take 30 mg by mouth at bedtime. [provider]  Active            Med Note Germaine Pomfret   Wed Jun 22, 2021 10:18 AM) Prescribed by Dr. Posey Pronto  lisinopril (ZESTRIL)  10 MG tablet [id number]  TAKE 1 TABLET(10 MG) BY MOUTH DAILY Teodora Medici, DO  Active   metFORMIN (GLUCOPHAGE) 1000 MG tablet [id number]  Take 0.5 tablets (500 mg total) by mouth 2 (two) times daily with a meal. Delsa Grana, PA-C  Active Multiple Informants  Multiple Vitamin (MULTIVITAMIN) tablet [id number]  Take 1 tablet by mouth daily. Pt taking QHS [provider]  Active Multiple Informants  Semaglutide (OZEMPIC, 0.25 OR 0.5 MG/DOSE, [place]) [id number]  Inject 0.25 mg into the skin once a week. [provider]  Active   TURMERIC PO [id number]  Take 1 capsule by mouth at bedtime. [provider]  Active Multiple Informants           Med Note Clemetine Marker D   Tue Aug 19, 2019  3:01 PM)    vitamin B-12 (CYANOCOBALAMIN) 100 MCG tablet [id number]  Take by mouth daily. Pt taking 1000 mcg daily QHS [provider]  Active Multiple Informants            Patient Active Problem List   Diagnosis Date Noted   Leukocytosis 03/18/2021   Depression 03/18/2021   Bicipital tendonitis of right shoulder 11/10/2020   Sprain of left foot 11/10/2020   Contusion of right chest wall 11/28/2019   Alzheimer's dementia without behavioral disturbance ([place]) 11/17/2019   Family history of Alzheimer's disease 11/17/2019   Urge incontinence 11/17/2019   Urinary frequency 11/17/2019   Spells of decreased attentiveness 10/28/2019   Trochanteric bursitis of left hip 05/20/2019   Gait abnormality 03/25/2019   Vertigo 03/25/2019   Microalbuminuria due to type 2 diabetes mellitus ([place]) 02/20/2018   Obesity (BMI 30.0-34.9) 02/19/2017   Breast calcification seen on mammogram 12/01/2016   Osteopenia 11/08/2016   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   Abdominal aortic ectasia (HCC) 05/19/2016   Abnormal EKG 05/12/2016   Localized osteoarthritis of lumbar spine 12/13/2015   Xerosis of skin 12/13/2015   Psoriatic arthritis ([place]) 11/18/2015   Lumbar radiculopathy, acute 10/14/2015    Vitamin D deficiency 08/18/2014   Hyperlipidemia    Hypertension    Primary osteoarthritis of left knee    Diabetes mellitus type 2 in obese ([place]) 07/24/2014   Status post bariatric surgery 07/24/2014    Immunization History  Administered Date(s) Administered   Influenza, High Dose Seasonal PF 11/14/2016, 09/24/2018   Influenza, Seasonal, Injecte, Preservative Fre 01/10/2007   Influenza,inj,Quad PF,6+ Mos 10/14/2015   Influenza-Unspecified 09/06/2013, 09/26/2019, 10/21/2020   PFIZER Comirnaty(Gray Top)Covid-19 Tri-Sucrose Vaccine 02/27/2019, 03/20/2019   PFIZER(Purple Top)SARS-COV-2 Vaccination 02/27/2019, 03/20/2019, 11/03/2019, 05/04/2020, 10/21/2020   Pneumococcal Conjugate-13 11/14/2016   Pneumococcal Polysaccharide-23 11/20/2017   Td 02/07/2004   Tdap 04/14/2016    Conditions to be addressed/monitored:  Hypertension, Hyperlipidemia, Diabetes, Depression, Osteopenia, Osteoarthritis, Overactive Bladder, and Alzheimer's Dementia  Care Plan : General Pharmacy (Adult)  Updates made by Germaine Pomfret, [place] since 10/05/2021 12:00 AM     Problem: Hypertension, Hyperlipidemia, Diabetes, Depression, Osteopenia, Osteoarthritis, Overactive Bladder, and Alzheimer's Dementia   Priority: High     Long-Range Goal: Patient-Specific Goal   Start Date: 07/05/2021  Expected End Date: 07/06/2022  This Visit's Progress: On track  Recent Progress: On track  Priority: High  Note:   Current Barriers:  Unable to independently afford treatment regimen Unable to achieve control of diabetes   Pharmacist Clinical Goal(s):  Patient will verbalize ability to afford treatment regimen achieve control of diabetes as evidenced by A1c less 8% through collaboration with PharmD and provider.   Interventions: 1:1 collaboration with Teodora Medici, DO regarding development and update of comprehensive plan of care as evidenced by provider attestation and co-signature Inter-disciplinary care team  collaboration (see longitudinal plan of care) Comprehensive medication review performed; medication list updated in electronic medical record  Hypertension (BP goal <130/80) -Controlled -Current treatment: Chlorthalidone 25 mg daily: Appropriate, Effective, Safe, Accessible Takes nightly, denies nocturia  Lisinopril 10 mg daily: Appropriate, Effective, Safe, Accessible  -Medications previously tried: NA  -Current home readings: 110/ -Denies hypotensive/hypertensive symptom -Recommended to continue current medication  Hyperlipidemia: (LDL goal < 70) -Controlled -Potential TIA Sep 2021  -Current treatment: Atorvastatin 80 mg daily: Appropriate, Effective, Safe, Accessible  -Current antiplatelet treatment: Clopidogrel 75 mg daily: Appropriate, Effective, Safe, Accessible -Medications previously tried: NA  -Bruises easily, one instance of hematuria a few months prior. Otherwise tolerated clopidogrel well. -Recommended to continue current medication  Diabetes (A1c goal <7%) -Uncontrolled, but improved.  -Complications: frequent UTIs  -Current medications: Metformin 500 mg twice daily: Appropriate, Query effective Ozempic 0.25 mg weekly (Fridays): Appropriate, Query effective Affordability concerns with Ozempic cost. -Medications previously tried: Trulicity (GI Upset), Jardiance (Yeast infections), Rybelsus (Cost)  -Current home glucose readings fasting glucose: 118  -Reports hypoglycemic/hyperglycemic symptoms: shaky  -Current meal patterns: Significant appetite reduction with Ozempic. Has lost 6 pounds.  lunch: Skips sometimes or will have light snacks  drinks: Diet coke (2 bottles), Unsweetened tea with lunch, water -Increase Ozempic to 0.5 mg weekly   Osteopenia (Goal Prevent fractures) -Controlled -Last DEXA Scan: 09/14/20   T-Score femoral neck: -1.9  T-Score total hip: -1.2  T-Score lumbar spine: -1.9  10-year probability of major osteoporotic fracture: 11.2%  10-year  probability of hip fracture: 2.0% -Patient is not a candidate for pharmacologic treatment -Current treatment  Calcium + D3 daily -Medications previously tried: NA  -Recommended to continue current medication  Patient Goals/Self-Care Activities Patient will:  - check glucose daily before breakfast, document, and provide at future appointments  Follow Up Plan: Telephone follow up appointment with care management team member scheduled for:  04/12/22 at 3:00 PM       Medication Assistance: Application for Ozempic  medication assistance program. in process.  Anticipated assistance start date TBD.  See plan of care for additional detail.  Compliance/Adherence/Medication fill history: Care Gaps: Shingrix Vaccine  Star-Rating Drugs: Atorvastatin 80 mg last filled 04/30/2021 90 day supply at [facility]. Metformin 1000 mg  last filled 04/26/2021 90 day supply at [facility]. Lisinopril 10 mg  last filled 04/24/2021 90 day supply at [facility].  Patient's preferred pharmacy is:  [id number] [address], [place] AT [facility] OF [address] [place] [address] Phone: [phone] Fax: [phone]  [address] [place] [address] Phone: [phone] Fax: [phone]  Uses pill box? Yes Pt endorses 100% compliance  We discussed: Current pharmacy is preferred with insurance plan and patient is satisfied with pharmacy services Patient decided to: Continue current medication management strategy  Care Plan and Follow Up Patient Decision:  Patient agrees to Care Plan and Follow-up.  Plan: Telephone follow up appointment with care management team member scheduled for:  04/12/22 at  3:00 PM  Junius Argyle, PharmD, Para [place], CPP  Clinical Pharmacist Practitioner  [facility] [phone]

## 2021-10-05 NOTE — Patient Instructions (Signed)
Visit Information It was great speaking with you today!  Please let me know if you have any questions about our visit.   Goals Addressed   None     Patient Care Plan: General Pharmacy (Adult)     Problem Identified: Hypertension, Hyperlipidemia, Diabetes, Depression, Osteopenia, Osteoarthritis, Overactive Bladder, and Alzheimer's Dementia   Priority: High     Long-Range Goal: Patient-Specific Goal   Start Date: 07/05/2021  Expected End Date: 07/06/2022  This Visit's Progress: On track  Recent Progress: On track  Priority: High  Note:   Current Barriers:  Unable to independently afford treatment regimen Unable to achieve control of diabetes   Pharmacist Clinical Goal(s):  Patient will verbalize ability to afford treatment regimen achieve control of diabetes as evidenced by A1c less 8% through collaboration with PharmD and provider.   Interventions: 1:1 collaboration with Margarita Mail, DO regarding development and update of comprehensive plan of care as evidenced by provider attestation and co-signature Inter-disciplinary care team collaboration (see longitudinal plan of care) Comprehensive medication review performed; medication list updated in electronic medical record  Hypertension (BP goal <130/80) -Controlled -Current treatment: Chlorthalidone 25 mg daily: Appropriate, Effective, Safe, Accessible Takes nightly, denies nocturia  Lisinopril 10 mg daily: Appropriate, Effective, Safe, Accessible  -Medications previously tried: NA  -Current home readings: 110/ -Denies hypotensive/hypertensive symptom -Recommended to continue current medication  Hyperlipidemia: (LDL goal < 70) -Controlled -Potential TIA Sep 2021  -Current treatment: Atorvastatin 80 mg daily: Appropriate, Effective, Safe, Accessible  -Current antiplatelet treatment: Clopidogrel 75 mg daily: Appropriate, Effective, Safe, Accessible -Medications previously tried: NA  -Bruises easily, one instance  of hematuria a few months prior. Otherwise tolerated clopidogrel well. -Recommended to continue current medication  Diabetes (A1c goal <7%) -Uncontrolled, but improved.  -Complications: frequent UTIs  -Current medications: Metformin 500 mg twice daily: Appropriate, Query effective Ozempic 0.25 mg weekly (Fridays): Appropriate, Query effective Affordability concerns with Ozempic cost. -Medications previously tried: Trulicity (GI Upset), Jardiance (Yeast infections), Rybelsus (Cost)  -Current home glucose readings fasting glucose: 118  -Reports hypoglycemic/hyperglycemic symptoms: shaky  -Current meal patterns: Significant appetite reduction with Ozempic. Has lost 6 pounds.  lunch: Skips sometimes or will have light snacks  drinks: Diet coke (2 bottles), Unsweetened tea with lunch, water -Increase Ozempic to 0.5 mg weekly   Osteopenia (Goal Prevent fractures) -Controlled -Last DEXA Scan: 09/14/20   T-Score femoral neck: -1.9  T-Score total hip: -1.2  T-Score lumbar spine: -1.9  10-year probability of major osteoporotic fracture: 11.2%  10-year probability of hip fracture: 2.0% -Patient is not a candidate for pharmacologic treatment -Current treatment  Calcium + D3 daily -Medications previously tried: NA  -Recommended to continue current medication  Patient Goals/Self-Care Activities Patient will:  - check glucose daily before breakfast, document, and provide at future appointments  Follow Up Plan: Telephone follow up appointment with care management team member scheduled for:  04/12/22 at 3:00 PM      Patient agreed to services and verbal consent obtained.   Patient verbalizes understanding of instructions and care plan provided today and agrees to view in MyChart. Active MyChart status and patient understanding of how to access instructions and care plan via MyChart confirmed with patient.     Cheyenne Adas, CPP Clinical Pharmacist Practitioner  [facility] [phone]

## 2021-10-06 DIAGNOSIS — E785 Hyperlipidemia, unspecified: Secondary | ICD-10-CM

## 2021-10-06 DIAGNOSIS — Z7984 Long term (current) use of oral hypoglycemic drugs: Secondary | ICD-10-CM

## 2021-10-06 DIAGNOSIS — E1169 Type 2 diabetes mellitus with other specified complication: Secondary | ICD-10-CM

## 2021-10-06 DIAGNOSIS — M8580 Other specified disorders of bone density and structure, unspecified site: Secondary | ICD-10-CM | POA: Diagnosis not present

## 2021-10-06 DIAGNOSIS — I10 Essential (primary) hypertension: Secondary | ICD-10-CM | POA: Diagnosis not present

## 2021-10-06 DIAGNOSIS — Z7985 Long-term (current) use of injectable non-insulin antidiabetic drugs: Secondary | ICD-10-CM

## 2021-10-11 ENCOUNTER — Telehealth: Payer: Self-pay | Admitting: Internal Medicine

## 2021-10-11 DIAGNOSIS — Z23 Encounter for immunization: Secondary | ICD-10-CM | POA: Diagnosis not present

## 2021-10-11 DIAGNOSIS — E669 Obesity, unspecified: Secondary | ICD-10-CM

## 2021-10-12 ENCOUNTER — Other Ambulatory Visit: Payer: Self-pay

## 2021-10-12 DIAGNOSIS — E1169 Type 2 diabetes mellitus with other specified complication: Secondary | ICD-10-CM

## 2021-10-12 MED ORDER — METFORMIN HCL 1000 MG PO TABS: 500.0000 mg | ORAL_TABLET | Freq: Two times a day (BID) | ORAL | 2 refills | Status: DC

## 2021-10-12 NOTE — Telephone Encounter (Signed)
Medication discontinued 08/03/2021  - not on med list. Requested Prescriptions  Pending Prescriptions Disp Refills  . glipiZIDE (GLUCOTROL) 5 MG tablet [Pharmacy Med Name: GLIPIZIDE 5MG  TABLETS] 30 tablet 3    Sig: TAKE 1 TABLET(5 MG) BY MOUTH DAILY BEFORE BREAKFAST     Endocrinology:  Diabetes - Sulfonylureas Passed - 10/11/2021  6:59 AM      Passed - HBA1C is between 0 and 7.9 and within 180 days    Hemoglobin A1C  Date Value Ref Range Status  08/02/2021 7.1 (A) 4.0 - 5.6 % Final   Hgb A1c MFr Bld  Date Value Ref Range Status  04/26/2021 8.5 (H) <5.7 % of total Hgb Final    Comment:    For someone without known diabetes, a hemoglobin A1c value of 6.5% or greater indicates that they may have  diabetes and this should be confirmed with a follow-up  test. . For someone with known diabetes, a value <7% indicates  that their diabetes is well controlled and a value  greater than or equal to 7% indicates suboptimal  control. A1c targets should be individualized based on  duration of diabetes, age, comorbid conditions, and  other considerations. . Currently, no consensus exists regarding use of hemoglobin A1c for diagnosis of diabetes for children. .          Passed - Cr in normal range and within 360 days    Creat  Date Value Ref Range Status  02/24/2021 0.80 0.60 - 1.00 mg/dL Final   Creatinine, Ser  Date Value Ref Range Status  03/18/2021 0.58 0.44 - 1.00 mg/dL Final   Creatinine, Urine  Date Value Ref Range Status  10/21/2020 17 (L) 20 - 275 mg/dL Final         Passed - Valid encounter within last 6 months    Recent Outpatient Visits          1 month ago Upper respiratory tract infection, unspecified type   [facility], DO   2 months ago Diabetes mellitus type 2 in obese [facility])   [facility], DO   3 months ago Acute cystitis without hematuria   [facility], DO   3 months ago Diabetes mellitus type 2 in obese [facility])   [facility], DO   5 months ago Diabetes mellitus type 2 in obese [facility])   [facility], DO      Future Appointments            In 3 weeks Caro Laroche, DO [facility]

## 2021-10-19 ENCOUNTER — Ambulatory Visit
Admission: RE | Admit: 2021-10-19 | Discharge: 2021-10-19 | Disposition: A | Discharge status: Home / Self Care | Payer: Medicare Other | Source: Ambulatory Visit | Attending: Family Medicine | Admitting: Family Medicine

## 2021-10-19 DIAGNOSIS — Z1231 Encounter for screening mammogram for malignant neoplasm of breast: Secondary | ICD-10-CM | POA: Diagnosis not present

## 2021-10-26 ENCOUNTER — Encounter: Payer: Self-pay | Admitting: Internal Medicine

## 2021-10-26 DIAGNOSIS — Z23 Encounter for immunization: Secondary | ICD-10-CM | POA: Diagnosis not present

## 2021-11-03 ENCOUNTER — Ambulatory Visit: Payer: Medicare Other | Admitting: Internal Medicine

## 2021-11-08 ENCOUNTER — Ambulatory Visit (INDEPENDENT_AMBULATORY_CARE_PROVIDER_SITE_OTHER): Payer: Medicare Other | Admitting: Internal Medicine

## 2021-11-08 ENCOUNTER — Encounter: Payer: Self-pay | Admitting: Internal Medicine

## 2021-11-08 VITALS — BP 120/70 | HR 78 | Temp 97.8°F | Resp 16 | Wt 158.1 lb

## 2021-11-08 DIAGNOSIS — M1990 Unspecified osteoarthritis, unspecified site: Secondary | ICD-10-CM

## 2021-11-08 DIAGNOSIS — R058 Other specified cough: Secondary | ICD-10-CM | POA: Diagnosis not present

## 2021-11-08 DIAGNOSIS — I10 Essential (primary) hypertension: Secondary | ICD-10-CM | POA: Diagnosis not present

## 2021-11-08 DIAGNOSIS — E1169 Type 2 diabetes mellitus with other specified complication: Secondary | ICD-10-CM

## 2021-11-08 DIAGNOSIS — E669 Obesity, unspecified: Secondary | ICD-10-CM

## 2021-11-08 LAB — POCT GLYCOSYLATED HEMOGLOBIN (HGB A1C): Hemoglobin A1C: 6.6 % — AB (ref 4.0–5.6)

## 2021-11-08 MED ORDER — BENZONATATE 100 MG PO CAPS: 100.0000 mg | ORAL_CAPSULE | Freq: Two times a day (BID) | ORAL | 0 refills | Status: DC | PRN

## 2021-11-08 MED ORDER — DULOXETINE HCL 30 MG PO CPEP: 30.0000 mg | ORAL_CAPSULE | Freq: Every evening | ORAL | 0 refills | Status: DC

## 2021-11-08 NOTE — Progress Notes (Signed)
Established Patient Office Visit  Subjective:  Patient ID: Sandy Salinas, female    DOB: September 13, 1950  Age: 71 y.o. MRN: [id number]  CC:  Chief Complaint  Patient presents with   Follow-up   Diabetes   Hyperlipidemia   Hypertension   Cough    At night wants refill on benzonatate    HPI Sandy Salinas presents for follow up on chronic medical conditions.   Hypertension: -Medications: Chlorthalidone 25 mg, Lisinopril 10 mg -Patient is compliant with above medications and reports no side effects. -Checking BP at home (average): 120-130/70-80 -Denies any SOB, CP, vision changes, LE edema or symptoms of hypotension  HLD: -Medications: Lipitor 80 mg -Patient is compliant with above medications and reports no side effects.  -Last lipid panel: Lipid Panel     Component Value Date/Time   CHOL 126 04/26/2021 1506   CHOL 152 02/18/2015 0841   TRIG 184 (H) 04/26/2021 1506   HDL 33 (L) 04/26/2021 1506   HDL 40 02/18/2015 0841   CHOLHDL 3.8 04/26/2021 1506   VLDL 35 (H) 06/06/2016 0831   LDLCALC 67 04/26/2021 1506   LABVLDL 28 02/18/2015 0841   Diabetes, Type 2: -Last A1c 6/23 7.1% -Medications: Ozempic 0.5 mg weekly, been on this for 2 weeks now, Metformin 500 mg BID - tolerating these medications well, has noticed appetite has reduced.  -Medications tried: She cannot take SGLT2's, as she had multiple UTIs while on Jardiance.  We tried glipizide this past month, even with 2.5 dose the patient had an episode of hypoglycemia.  She cannot tolerate any higher doses of metformin due to GI side effects. -Checking BG at home: fasting 107-129 -Lowest home BG since last visit: no hypoglycemic events since Glipizide  -Diet: working on diet, eating more fruit and less processed carbs -Exercise: No regular exercise but active, does yard work -Eye exam: November 2022 -Foot exam: UTD -Microalbumin: Due today -Statin: Lipitor 80 mg  -PNA vaccine: UTD -Denies symptoms of  hypoglycemia, polyuria, polydipsia, numbness extremities, foot ulcers/trauma.   Osteoporosis:  -Currently on calcium and Vitamin D -DEXA 8/22 showing t score of -1.9  Memory Loss: -Previously seen by Neuro in 9/21 for an episode of memory loss, considered dementia vs. TIA -EEG and MRI showing ischemic disease, started on Plavix by Dr. Manuella Ghazi  -Does have family history of dementia in mother and aunts/uncles  Urge Incontinence:  -Currently on Myrbetriq and following with Urology   OA: -Unable to take anti-inflammatories secondary to history of gastric bypass -Has been given Cymbalta 30 mg to take at night to help with pain by rheumatology, patient states she is out of this medication but it did help her pain   Past Medical History:  Diagnosis Date   Abdominal aortic ectasia ([place]) 05/19/2016   [place] April 2018, rescan in five years (April 2023)   Breast calcification seen on mammogram 12/01/2016   LEFT   Cataract    Depression 03/18/2021   Diabetes mellitus without complication ([place])    Family history of abdominal aortic aneurysm (AAA) 05/12/2016   father   Hyperlipidemia    Hypertension    Lumbar radiculopathy, acute    Microalbuminuria due to type 2 diabetes mellitus ([place])    Obesity    Osteoporosis    Primary osteoarthritis of left knee    Psoriasis, unspecified    Pyelonephritis 03/17/2021   Seizures ([place])     Past Surgical History:  Procedure Laterality Date   [place]  total   BREAST BIOPSY Right 04/09/2012   x 2, FIBROADENOMA WITH COARSE CALCIFICATIONS.done by byrnett   BREAST BIOPSY Left 09/04/2017   Affirm Bx- X clip, FIBROADENOMATOUS CHANGE WITH CALCIFICATIONS. NEGATIVE FOR ATYPIA AND MALIGNANCY   CYSTECTOMY  2003   ROUX-EN-Y GASTRIC BYPASS  2009   TONSILLECTOMY  1970   TUBAL LIGATION  1975    Family History  Problem Relation Age of Onset   Alzheimer's disease Mother    Heart murmur Mother    Dementia Mother    Arthritis Mother    AAA  (abdominal aortic aneurysm) Father    Heart disease Father    Asthma Sister    Hypertension Sister    Cancer Sister        breast   Breast cancer Sister        late 14's   Heart disease Brother    Hypertension Brother    Aneurysm Paternal Aunt    Lung cancer Paternal Uncle    Cancer Paternal Grandmother        possible, not sure what kind   Cancer Cousin     Social History   Socioeconomic History   Marital status: Married    Spouse name: Doctor, general practice   Number of children: 2   Years of education: Not on file   Highest education level: Some college, no degree  Occupational History   Occupation: retired  Tobacco Use   Smoking status: Never   Smokeless tobacco: Never  Vaping Use   Vaping Use: Never used  Substance and Sexual Activity   Alcohol use: No   Drug use: No   Sexual activity: Not Currently    Birth control/protection: Abstinence    Comment: Old Age  Other Topics Concern   Not on file  Social History Narrative   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    Social Determinants of Health   Financial Resource Strain: High Risk (09/15/2021)   Overall Financial Resource Strain (CARDIA)    Difficulty of Paying Living Expenses: Hard  Food Insecurity: No Food Insecurity (09/15/2021)   Hunger Vital Sign    Worried About Running Out of Food in the Last Year: Never true    Ran Out of Food in the Last Year: Never true  Transportation Needs: No Transportation Needs (09/15/2021)   PRAPARE - Hydrologist (Medical): No    Lack of Transportation (Non-Medical): No  Physical Activity: Insufficiently Active (09/15/2021)   Exercise Vital Sign    Days of Exercise per Week: 4 days    Minutes of Exercise per Session: 30 min  Stress: No Stress Concern Present (09/15/2021)   [place]    Feeling of Stress : Not at all  Social Connections: Moderately Isolated (09/15/2021)   Social  Connection and Isolation Panel [NHANES]    Frequency of Communication with Friends and Family: More than three times a week    Frequency of Social Gatherings with Friends and Family: More than three times a week    Attends Religious Services: Never    Marine scientist or Organizations: No    Attends Archivist Meetings: Never    Marital Status: Married  Human resources officer Violence: Not At Risk (09/14/2020)   Humiliation, Afraid, Rape, and Kick questionnaire    Fear of Current or Ex-Partner: No    Emotionally Abused: No    Physically Abused: No    Sexually Abused: No  Outpatient Medications Prior to Visit  Medication Sig Dispense Refill   Ascorbic Acid (VITAMIN C) 1000 MG tablet Take 1,000 mg by mouth daily.     atorvastatin (LIPITOR) 80 MG tablet TAKE 1 TABLET (80 MG) BY MOUTH AT BEDTIME 90 tablet 3   blood glucose meter kit and supplies KIT Dispense based on patient and insurance preference. Use to monitor blood sugars once daily as directed. 1 each 0   Calcium Carbonate-Vitamin D (CALTRATE 600+D PO) Take by mouth daily.     chlorthalidone (HYGROTON) 25 MG tablet TAKE 1 TABLET(25 MG) BY MOUTH DAILY 90 tablet 1   clopidogrel (PLAVIX) 75 MG tablet Take 75 mg by mouth daily. Pt taking QHS     DULoxetine (CYMBALTA) 30 MG capsule Take 30 mg by mouth at bedtime.     lisinopril (ZESTRIL) 10 MG tablet TAKE 1 TABLET(10 MG) BY MOUTH DAILY 90 tablet 1   metFORMIN (GLUCOPHAGE) 1000 MG tablet Take 0.5 tablets (500 mg total) by mouth 2 (two) times daily with a meal. 180 tablet 2   Multiple Vitamin (MULTIVITAMIN) tablet Take 1 tablet by mouth daily. Pt taking QHS     Semaglutide,0.25 or 0.5MG/DOS, (OZEMPIC, 0.25 OR 0.5 MG/DOSE,) 2 MG/3ML SOPN Inject 0.5 mg into the skin once a week. Patient receives via [employer] Patient Assistance through Dec 2023     TURMERIC PO Take 1 capsule by mouth at bedtime.     vitamin B-12 (CYANOCOBALAMIN) 100 MCG tablet Take by mouth daily. Pt taking 1000  mcg daily QHS     benzonatate (TESSALON) 100 MG capsule Take 1 capsule (100 mg total) by mouth 2 (two) times daily as needed for cough. (Patient not taking: Reported on 11/08/2021) 20 capsule 0   No facility-administered medications prior to visit.    Allergies  Allergen Reactions   Codeine Nausea Only and Other (See Comments)    Hallucinations   Jardiance [Empagliflozin] Other (See Comments)    Recurrent UTI, yeast infection    ROS Review of Systems  Constitutional:  Negative for chills and fever.  Eyes:  Negative for visual disturbance.  Respiratory:  Negative for cough and shortness of breath.   Cardiovascular:  Negative for chest pain.  Gastrointestinal:  Negative for abdominal distention, abdominal pain, diarrhea, nausea and vomiting.  Musculoskeletal:  Positive for arthralgias.      Objective:    Physical Exam Constitutional:      Appearance: Normal appearance.  HENT:     Head: Normocephalic and atraumatic.  Eyes:     Conjunctiva/sclera: Conjunctivae normal.  Cardiovascular:     Rate and Rhythm: Normal rate and regular rhythm.  Pulmonary:     Effort: Pulmonary effort is normal.     Breath sounds: Normal breath sounds.  Musculoskeletal:     Right lower leg: No edema.     Left lower leg: No edema.  Skin:    General: Skin is warm and dry.  Neurological:     General: No focal deficit present.     Mental Status: She is alert. Mental status is at baseline.  Psychiatric:        Mood and Affect: Mood normal.        Behavior: Behavior normal.     BP 120/70   Pulse 78   Temp 97.8 F (36.6 C)   Resp 16   Wt 158 lb 1.6 oz (71.7 kg)   SpO2 98%   BMI 27.14 kg/m  Wt Readings from Last 3 Encounters:  11/08/21  158 lb 1.6 oz (71.7 kg)  08/23/21 159 lb 1.6 oz (72.2 kg)  08/02/21 161 lb 9.6 oz (73.3 kg)     Health Maintenance Due  Topic Date Due   Diabetic kidney evaluation - Urine ACR  10/21/2021    There are no preventive care reminders to display for this  patient.  Lab Results  Component Value Date   TSH 1.56 02/24/2021   Lab Results  Component Value Date   WBC 13.2 (H) 03/18/2021   HGB 12.5 03/18/2021   HCT 39.6 03/18/2021   MCV 84.1 03/18/2021   PLT 584 (H) 03/18/2021   Lab Results  Component Value Date   NA 137 03/18/2021   K 3.9 03/18/2021   CO2 29 03/18/2021   GLUCOSE 127 (H) 03/18/2021   BUN 13 03/18/2021   CREATININE 0.58 03/18/2021   BILITOT 0.6 03/17/2021   ALKPHOS 61 03/17/2021   AST 18 03/17/2021   ALT 11 03/17/2021   PROT 6.9 03/17/2021   ALBUMIN 3.6 03/17/2021   CALCIUM 9.2 03/18/2021   ANIONGAP 8 03/18/2021   EGFR 79 02/24/2021   Lab Results  Component Value Date   CHOL 126 04/26/2021   Lab Results  Component Value Date   HDL 33 (L) 04/26/2021   Lab Results  Component Value Date   LDLCALC 67 04/26/2021   Lab Results  Component Value Date   TRIG 184 (H) 04/26/2021   Lab Results  Component Value Date   CHOLHDL 3.8 04/26/2021   Lab Results  Component Value Date   HGBA1C 7.1 (A) 08/02/2021      Assessment & Plan:   1. Diabetes mellitus type 2 in obese [facility]): A1c in the office today improved to 6.6%.  Microalbumin obtained today.  Continue metformin 500 mg twice daily, as well as Ozempic 0.5 mg weekly.  We will maintain patient on current Ozempic dose with plans to follow-up in 6 months to recheck.  - POCT HgB A1C - Urine Microalbumin w/creat. ratio  2. Primary hypertension: Stable.  Continue chlorthalidone 25 mg and lisinopril 10 mg daily.  3. Post-viral cough syndrome: Still having some dry cough, especially at night, refill Tessalon Perles.  - benzonatate (TESSALON) 100 MG capsule; Take 1 capsule (100 mg total) by mouth 2 (two) times daily as needed for cough.  Dispense: 20 capsule; Refill: 0  4. Arthritis: Refill Cymbalta 30 mg for pain.   - DULoxetine (CYMBALTA) 30 MG capsule; Take 1 capsule (30 mg total) by mouth at bedtime.  Dispense: 90 capsule; Refill: 0   Follow-up: Return  in about 6 months (around 05/10/2022).    Teodora Medici, DO

## 2021-11-08 NOTE — Patient Instructions (Addendum)
It was great seeing you today!  Plan discussed at today's visit: -A1c 6.6% -Cymbalta 30 mg at night sent to pharmacy -Cough medication to take at night sent as well    Follow up in: 6 months   Take care and let us know if you have any questions or concerns prior to your next visit.  Dr. Rosana Berger

## 2021-11-09 LAB — MICROALBUMIN / CREATININE URINE RATIO
Creatinine, Urine: 29 mg/dL (ref 20–275)
Microalb, Ur: <0.2 mg/dL

## 2021-11-29 ENCOUNTER — Telehealth: Payer: Self-pay

## 2021-11-29 NOTE — Progress Notes (Signed)
    Chronic Care Management Pharmacy Assistant   Name: Sandy Salinas  MRN: [id number] DOB: 1950/06/23  Reason for Encounter: Medication Review/Patient assistance renewal for ozempic.   Medications: Outpatient Encounter Medications as of 11/29/2021  Medication Sig   Ascorbic Acid (VITAMIN C) 1000 MG tablet Take 1,000 mg by mouth daily.   atorvastatin (LIPITOR) 80 MG tablet TAKE 1 TABLET (80 MG) BY MOUTH AT BEDTIME   benzonatate (TESSALON) 100 MG capsule Take 1 capsule (100 mg total) by mouth 2 (two) times daily as needed for cough.   blood glucose meter kit and supplies KIT Dispense based on patient and insurance preference. Use to monitor blood sugars once daily as directed.   Calcium Carbonate-Vitamin D (CALTRATE 600+D PO) Take by mouth daily.   chlorthalidone (HYGROTON) 25 MG tablet TAKE 1 TABLET(25 MG) BY MOUTH DAILY   DULoxetine (CYMBALTA) 30 MG capsule Take 1 capsule (30 mg total) by mouth at bedtime.   lisinopril (ZESTRIL) 10 MG tablet TAKE 1 TABLET(10 MG) BY MOUTH DAILY   metFORMIN (GLUCOPHAGE) 1000 MG tablet Take 0.5 tablets (500 mg total) by mouth 2 (two) times daily with a meal.   Multiple Vitamin (MULTIVITAMIN) tablet Take 1 tablet by mouth daily. Pt taking QHS   Semaglutide,0.25 or 0.5MG /DOS, (OZEMPIC, 0.25 OR 0.5 MG/DOSE,) 2 MG/3ML SOPN Inject 0.5 mg into the skin once a week. Patient receives via [employer] Patient Assistance through Dec 2023   TURMERIC PO Take 1 capsule by mouth at bedtime.   vitamin B-12 (CYANOCOBALAMIN) 100 MCG tablet Take by mouth daily. Pt taking 1000 mcg daily QHS   No facility-administered encounter medications on file as of 11/29/2021.     Patient assistance renewal for Ozempic:  I received a task from Junius Argyle, CPP requesting that I start the renewal application for patient assistance on the medication Ozempic to continue assistance through year 2024.    Unable to speak  with the patient to inform her that the application will be  mailed.Once she receives the application she will need to complete her part of the application and return it to her PCP office for Junius Argyle, CPP to fax over to [employer] for processing. Patient should include a copy of her proof of income.     Application emailed to Junius Argyle, CPP for review and to mail to patient home.  [place] Pharmacist Assistant [phone]

## 2021-11-30 DIAGNOSIS — M159 Polyosteoarthritis, unspecified: Secondary | ICD-10-CM | POA: Diagnosis not present

## 2021-11-30 DIAGNOSIS — M1712 Unilateral primary osteoarthritis, left knee: Secondary | ICD-10-CM | POA: Diagnosis not present

## 2021-12-08 ENCOUNTER — Other Ambulatory Visit: Payer: Self-pay

## 2021-12-08 DIAGNOSIS — E782 Mixed hyperlipidemia: Secondary | ICD-10-CM

## 2021-12-08 MED ORDER — ATORVASTATIN CALCIUM 80 MG PO TABS: ORAL_TABLET | ORAL | 3 refills | Status: DC

## 2021-12-23 ENCOUNTER — Other Ambulatory Visit: Payer: Self-pay | Admitting: Internal Medicine

## 2021-12-23 DIAGNOSIS — I10 Essential (primary) hypertension: Secondary | ICD-10-CM

## 2021-12-23 DIAGNOSIS — E11649 Type 2 diabetes mellitus with hypoglycemia without coma: Secondary | ICD-10-CM

## 2021-12-23 NOTE — Telephone Encounter (Signed)
Requested Prescriptions  Pending Prescriptions Disp Refills   chlorthalidone (HYGROTON) 25 MG tablet [Pharmacy Med Name: CHLORTHALIDONE 25MG  TABLETS] 90 tablet 1    Sig: TAKE 1 TABLET(25 MG) BY MOUTH DAILY     Cardiovascular: Diuretics - Thiazide Failed - 12/23/2021  7:09 AM      Failed - Cr in normal range and within 180 days    Creat  Date Value Ref Range Status  02/24/2021 0.80 0.60 - 1.00 mg/dL Final   Creatinine, Ser  Date Value Ref Range Status  03/18/2021 0.58 0.44 - 1.00 mg/dL Final   Creatinine, Urine  Date Value Ref Range Status  11/08/2021 29 20 - 275 mg/dL Final         Failed - K in normal range and within 180 days    Potassium  Date Value Ref Range Status  03/18/2021 3.9 3.5 - 5.1 mmol/L Final         Failed - Na in normal range and within 180 days    Sodium  Date Value Ref Range Status  03/18/2021 137 135 - 145 mmol/L Final  02/18/2015 141 134 - 144 mmol/L Final         Passed - Last BP in normal range    BP Readings from Last 1 Encounters:  11/08/21 120/70         Passed - Valid encounter within last 6 months    Recent Outpatient Visits           1 month ago Diabetes mellitus type 2 in obese [facility])   [facility] Teodora Medici, DO   4 months ago Upper respiratory tract infection, unspecified type   [facility] Teodora Medici, DO   4 months ago Diabetes mellitus type 2 in obese [facility])   [facility] Teodora Medici, DO   6 months ago Acute cystitis without hematuria   [facility] Teodora Medici, DO   6 months ago Diabetes mellitus type 2 in obese [facility])   [facility] Teodora Medici, DO               lisinopril (ZESTRIL) 10 MG tablet [Pharmacy Med Name: LISINOPRIL 10MG  TABLETS] 90 tablet 1    Sig: TAKE 1 TABLET(10 MG) BY MOUTH DAILY     Cardiovascular:  ACE Inhibitors Failed - 12/23/2021  7:09 AM      Failed - Cr in normal  range and within 180 days    Creat  Date Value Ref Range Status  02/24/2021 0.80 0.60 - 1.00 mg/dL Final   Creatinine, Ser  Date Value Ref Range Status  03/18/2021 0.58 0.44 - 1.00 mg/dL Final   Creatinine, Urine  Date Value Ref Range Status  11/08/2021 29 20 - 275 mg/dL Final         Failed - K in normal range and within 180 days    Potassium  Date Value Ref Range Status  03/18/2021 3.9 3.5 - 5.1 mmol/L Final         Passed - Patient is not pregnant      Passed - Last BP in normal range    BP Readings from Last 1 Encounters:  11/08/21 120/70         Passed - Valid encounter within last 6 months    Recent Outpatient Visits           1 month ago Diabetes mellitus type 2 in obese [facility])   [facility]  Margarita Mail, DO   4 months ago Upper respiratory tract infection, unspecified type   [facility] Margarita Mail, DO   4 months ago Diabetes mellitus type 2 in obese [facility])   [facility] Margarita Mail, DO   6 months ago Acute cystitis without hematuria   [facility] Margarita Mail, DO   6 months ago Diabetes mellitus type 2 in obese [facility])   [facility] Margarita Mail, [place]

## 2022-02-05 ENCOUNTER — Other Ambulatory Visit: Payer: Self-pay | Admitting: Internal Medicine

## 2022-02-05 DIAGNOSIS — M1990 Unspecified osteoarthritis, unspecified site: Secondary | ICD-10-CM

## 2022-02-07 MED ORDER — DULOXETINE HCL 30 MG PO CPEP: 30.0000 mg | ORAL_CAPSULE | Freq: Every evening | ORAL | 0 refills | Status: DC

## 2022-02-21 ENCOUNTER — Telehealth: Payer: Self-pay

## 2022-02-21 NOTE — Progress Notes (Signed)
Care Management & Coordination Services Pharmacy Team  Reason for Encounter: Diabetes  Contacted patient on 02/21/2022 to discuss diabetes disease state.   Recent office visits:  11/08/2021 Sandy Medici DO (PCP) No Medication Changes noted, Follow up 6 months  Recent consult visits:  11/30/2021 Dr. Posey Pronto MD (Rheumatology) No Medication Changes noted  Hospital visits:  None in previous 6 months  Medications: Outpatient Encounter Medications as of 02/21/2022  Medication Sig   Ascorbic Acid (VITAMIN C) 1000 MG tablet Take 1,000 mg by mouth daily.   atorvastatin (LIPITOR) 80 MG tablet TAKE 1 TABLET (80 MG) BY MOUTH AT BEDTIME   benzonatate (TESSALON) 100 MG capsule Take 1 capsule (100 mg total) by mouth 2 (two) times daily as needed for cough.   blood glucose meter kit and supplies KIT Dispense based on patient and insurance preference. Use to monitor blood sugars once daily as directed.   Calcium Carbonate-Vitamin D (CALTRATE 600+D PO) Take by mouth daily.   chlorthalidone (HYGROTON) 25 MG tablet TAKE 1 TABLET(25 MG) BY MOUTH DAILY   DULoxetine (CYMBALTA) 30 MG capsule Take 1 capsule (30 mg total) by mouth at bedtime.   lisinopril (ZESTRIL) 10 MG tablet TAKE 1 TABLET(10 MG) BY MOUTH DAILY   metFORMIN (GLUCOPHAGE) 1000 MG tablet Take 0.5 tablets (500 mg total) by mouth 2 (two) times daily with a meal.   Multiple Vitamin (MULTIVITAMIN) tablet Take 1 tablet by mouth daily. Pt taking QHS   Semaglutide,0.25 or 0.5MG /DOS, (OZEMPIC, 0.25 OR 0.5 MG/DOSE,) 2 MG/3ML SOPN Inject 0.5 mg into the skin once a week. Patient receives via [employer] Patient Assistance through Dec 2023   TURMERIC PO Take 1 capsule by mouth at bedtime.   vitamin B-12 (CYANOCOBALAMIN) 100 MCG tablet Take by mouth daily. Pt taking 1000 mcg daily QHS   No facility-administered encounter medications on file as of 02/21/2022.    Recent Relevant Labs: Lab Results  Component Value Date/Time   HGBA1C 6.6 (A) 11/08/2021  01:43 PM   HGBA1C 7.1 (A) 08/02/2021 03:48 PM   HGBA1C 8.5 (H) 04/26/2021 03:06 PM   HGBA1C 8.3 (H) 10/21/2020 03:05 PM   MICROALBUR <0.2 11/08/2021 03:32 PM   MICROALBUR <0.2 10/21/2020 03:05 PM   MICROALBUR 10 02/18/2015 08:41 AM    Kidney Function Lab Results  Component Value Date/Time   CREATININE 0.58 03/18/2021 03:59 AM   CREATININE 0.76 03/17/2021 06:11 PM   CREATININE 0.80 02/24/2021 02:07 PM   CREATININE 0.70 10/21/2020 03:05 PM   GFRNONAA >60 03/18/2021 03:59 AM   GFRNONAA 90 09/23/2019 11:56 AM   GFRAA 104 09/23/2019 11:56 AM    Current antihyperglycemic regimen:  Metformin 500 mg twice daily  Ozempic 0.25 mg weekly (Fridays)     Patient verbally confirms she is taking the above medications as directed. Yes  What diet changes have been made to improve diabetes control?  Patient denies any changes at this time  What recent interventions/DTPs have been made to improve glycemic control:  None ID  Have there been any recent hospitalizations or ED visits since last visit with PharmD? No  Patient denies hypoglycemic symptoms, including Pale, Sweaty, Shaky, Hungry, Nervous/irritable, and Vision changes  Patient denies hyperglycemic symptoms, including blurry vision, excessive thirst, fatigue, polyuria, and weakness  How often are you checking your blood sugar? Patient states she checks her blood sugar several times in a week  What are your blood sugars ranging?  Patient states she is unsure what her blood sugar ranges around, but reports it  is "normal".  During the week, how often does your blood glucose drop below 70? Never  Are you checking your feet daily/regularly? Yes.Patient denies any concerns regarding her feet.  Adherence Review: Is the patient currently on a STATIN medication? Yes Is the patient currently on ACE/ARB medication? Yes Does the patient have >5 day gap between last estimated fill dates? No  Care Gaps: Shingrix Vaccine COVID-19  Vaccine Foot Exam Ophthalmology Exam Diabetic Kidney Evaluation-eGFR measurement   Star Rating Drugs: Ozempic 0.5 mg - receives from [employer] Atorvastatin 80 mg last filled 12/08/2021 90 day supply at [facility]. Metformin 1000 mg  last filled 12/23/2021 90 day supply at [facility]. Lisinopril 10 mg  last filled 12/23/2021 90 day supply at [facility].  Care Gaps: Annual wellness visit in last year? Yes- completed on 09/15/2021 Last eye exam / retinopathy screening: last eye exam was completed on 02/06/2021. Last diabetic foot exam:Last completed on 12/14/2020.   [place] Pharmacist Assistant [phone]

## 2022-04-26 ENCOUNTER — Ambulatory Visit: Payer: Medicare Other

## 2022-04-26 DIAGNOSIS — I10 Essential (primary) hypertension: Secondary | ICD-10-CM

## 2022-04-26 DIAGNOSIS — E119 Type 2 diabetes mellitus without complications: Secondary | ICD-10-CM | POA: Diagnosis not present

## 2022-04-26 DIAGNOSIS — E669 Obesity, unspecified: Secondary | ICD-10-CM

## 2022-04-26 DIAGNOSIS — H2513 Age-related nuclear cataract, bilateral: Secondary | ICD-10-CM | POA: Diagnosis not present

## 2022-04-26 LAB — HM DIABETES EYE EXAM

## 2022-04-26 NOTE — Progress Notes (Signed)
Care Management & Coordination Services Pharmacy Note  04/26/2022 Name:  Sandy Salinas MRN:  [id number] DOB:  04-01-1950  Summary: Patient presents for follow-up consult.   Recommendations/Changes made from today's visit: Continue current medications   Follow up plan: CPP follow-up 6 months   Subjective: Sandy Salinas is an 72 y.o. year old female who is a primary patient of Teodora Medici, DO.  The care coordination team was consulted for assistance with disease management and care coordination needs.    Engaged with patient by telephone for follow up visit.  Recent office visits: 11/08/21: Patient presented to Dr. Rosana Berger for follow-up.   Recent consult visits: 11/30/21: Patient presented to Dr. Posey Pronto (rheumatology) for follow-up.   Hospital visits: None in previous 6 months   Objective:  Lab Results  Component Value Date   CREATININE 0.58 03/18/2021   BUN 13 03/18/2021   EGFR 79 02/24/2021   GFRNONAA >60 03/18/2021   GFRAA 104 09/23/2019   NA 137 03/18/2021   K 3.9 03/18/2021   CALCIUM 9.2 03/18/2021   CO2 29 03/18/2021   GLUCOSE 127 (H) 03/18/2021    Lab Results  Component Value Date/Time   HGBA1C 6.6 (A) 11/08/2021 01:43 PM   HGBA1C 7.1 (A) 08/02/2021 03:48 PM   HGBA1C 8.5 (H) 04/26/2021 03:06 PM   HGBA1C 8.3 (H) 10/21/2020 03:05 PM   MICROALBUR <0.2 11/08/2021 03:32 PM   MICROALBUR <0.2 10/21/2020 03:05 PM   MICROALBUR 10 02/18/2015 08:41 AM    Last diabetic Eye exam:  Lab Results  Component Value Date/Time   HMDIABEYEEXA No Retinopathy 11/01/2015 12:00 AM    Last diabetic Foot exam: No results found for: "HMDIABFOOTEX"   Lab Results  Component Value Date   CHOL 126 04/26/2021   HDL 33 (L) 04/26/2021   LDLCALC 67 04/26/2021   TRIG 184 (H) 04/26/2021   CHOLHDL 3.8 04/26/2021       Latest Ref Rng & Units 03/17/2021    8:07 PM 02/24/2021    2:07 PM 10/21/2020    3:05 PM  Hepatic Function  Total Protein 6.5 - 8.1 g/dL 6.9  6.7   6.6   Albumin 3.5 - 5.0 g/dL 3.6     AST 15 - 41 U/L 18  17  22    ALT 0 - 44 U/L 11  7  11    Alk Phosphatase 38 - 126 U/L 61     Total Bilirubin 0.3 - 1.2 mg/dL 0.6  0.3  0.3   Bilirubin, Direct 0.0 - 0.2 mg/dL 0.1       Lab Results  Component Value Date/Time   TSH 1.56 02/24/2021 02:07 PM   TSH 1.75 04/27/2020 03:46 PM       Latest Ref Rng & Units 03/18/2021    3:59 AM 03/17/2021    6:11 PM 02/24/2021    2:07 PM  CBC  WBC 4.0 - 10.5 K/uL 13.2  15.7  10.0   Hemoglobin 12.0 - 15.0 g/dL 12.5  12.4  12.8   Hematocrit 36.0 - 46.0 % 39.6  39.2  40.2   Platelets 150 - 400 K/uL 584  611  521     Lab Results  Component Value Date/Time   VD25OH 35 02/24/2021 02:07 PM   VD25OH 45 11/14/2016 10:44 AM   VITAMINB12 446 02/24/2021 02:07 PM   VITAMINB12 >2000 (H) 06/06/2016 08:31 AM    Clinical ASCVD: Yes  The ASCVD Risk score (Arnett DK, et al., 2019) failed to calculate for the following  reasons:   The valid total cholesterol range is 130 to 320 mg/dL       11/08/2021    1:16 PM 09/15/2021    9:42 AM 09/15/2021    9:30 AM  Depression screen PHQ 2/9  Decreased Interest 0 0 0  Down, Depressed, Hopeless 0 0 0  PHQ - 2 Score 0 0 0  Altered sleeping 0 0 0  Tired, decreased energy 0 0 0  Change in appetite 0 0 0  Feeling bad or failure about yourself  0 0 0  Trouble concentrating 0 0 0  Moving slowly or fidgety/restless 0 0 0  Suicidal thoughts 0 0 0  PHQ-9 Score 0 0 0  Difficult doing work/chores Not difficult at all       Social History   Tobacco Use  Smoking Status Never  Smokeless Tobacco Never   BP Readings from Last 3 Encounters:  11/08/21 120/70  08/23/21 116/64  08/02/21 120/68   Pulse Readings from Last 3 Encounters:  11/08/21 78  08/23/21 89  08/02/21 85   Wt Readings from Last 3 Encounters:  11/08/21 158 lb 1.6 oz (71.7 kg)  08/23/21 159 lb 1.6 oz (72.2 kg)  08/02/21 161 lb 9.6 oz (73.3 kg)   BMI Readings from Last 3 Encounters:  11/08/21 27.14 kg/m   08/23/21 27.31 kg/m  08/02/21 27.74 kg/m    Allergies  Allergen Reactions   Codeine Nausea Only and Other (See Comments)    Hallucinations   Jardiance [Empagliflozin] Other (See Comments)    Recurrent UTI, yeast infection    Medications Reviewed Today     Reviewed by Teodora Medici, DO (Physician) on 11/08/21 at 1612  Med List Status: <None>   Medication Order Taking? Sig Documenting Provider Last Dose Status Informant  Ascorbic Acid (VITAMIN C) 1000 MG tablet [id number] Yes Take 1,000 mg by mouth daily. [provider] Taking Active   atorvastatin (LIPITOR) 80 MG tablet [id number] Yes TAKE 1 TABLET (80 MG) BY MOUTH AT BEDTIME Delsa Grana, PA-C Taking Active Multiple Informants  benzonatate (TESSALON) 100 MG capsule [id number]  Take 1 capsule (100 mg total) by mouth 2 (two) times daily as needed for cough. Teodora Medici, DO  Active   blood glucose meter kit and supplies KIT [id number] Yes Dispense based on patient and insurance preference. Use to monitor blood sugars once daily as directed. Teodora Medici, DO Taking Active   Calcium Carbonate-Vitamin D (CALTRATE 600+D PO) [id number] Yes Take by mouth daily. [provider] Taking Active Multiple Informants           Med Note Alleen Borne   Tue Aug 19, 2019  3:01 PM)    chlorthalidone (HYGROTON) 25 MG tablet [id number] Yes TAKE 1 TABLET(25 MG) BY MOUTH DAILY Teodora Medici, DO Taking Active   DULoxetine (CYMBALTA) 30 MG capsule [id number]  Take 1 capsule (30 mg total) by mouth at bedtime. Teodora Medici, DO  Active   lisinopril (ZESTRIL) 10 MG tablet [id number] Yes TAKE 1 TABLET(10 MG) BY MOUTH DAILY Teodora Medici, DO Taking Active   metFORMIN (GLUCOPHAGE) 1000 MG tablet [id number] Yes Take 0.5 tablets (500 mg total) by mouth 2 (two) times daily with a meal. Teodora Medici, DO Taking Active   Multiple Vitamin (MULTIVITAMIN) tablet [id number] Yes Take 1 tablet by mouth daily. Pt taking QHS  [provider] Taking Active Multiple Informants  Semaglutide,0.25 or 0.5MG /DOS, (OZEMPIC, 0.25 OR 0.5 MG/DOSE,) 2 MG/3ML SOPN [id number] Yes Inject 0.5 mg into the skin once  a week. Patient receives via [employer] Patient Assistance through Dec 2023 Teodora Medici, DO Taking Active   TURMERIC PO [id number] Yes Take 1 capsule by mouth at bedtime. [provider] Taking Active Multiple Informants           Med Note Clemetine Marker D   Tue Aug 19, 2019  3:01 PM)    vitamin B-12 (CYANOCOBALAMIN) 100 MCG tablet [id number] Yes Take by mouth daily. Pt taking 1000 mcg daily QHS [provider] Taking Active Multiple Informants            SDOH:  (Social Determinants of Health) assessments and interventions performed: Yes SDOH Interventions    Flowsheet Row Clinical Support from 09/15/2021 in [place] [facility] from 06/22/2021 in [place] from 09/14/2020 in [facility]  SDOH Interventions     Food Insecurity Interventions Intervention Not Indicated -- Intervention Not Indicated  Housing Interventions Intervention Not Indicated -- Intervention Not Indicated  Transportation Interventions Intervention Not Indicated Intervention Not Indicated Intervention Not Indicated  Financial Strain Interventions Other (Comment) Other (Comment)  [PAP] Intervention Not Indicated  Physical Activity Interventions Intervention Not Indicated -- Intervention Not Indicated  [pt active at home and in yard but no exercise routine]  Stress Interventions Intervention Not Indicated -- Intervention Not Indicated  Social Connections Interventions Intervention Not Indicated -- Intervention Not Indicated       Medication Assistance: None required.  Patient affirms current coverage meets needs.  Medication Access: Within the past 30 days, how often has patient missed a dose of  medication? None Is a pillbox or other method used to improve adherence? Yes  Factors that may affect medication adherence? no barriers identified Are meds synced by current pharmacy? No  Are meds delivered by current pharmacy? No  Does patient experience delays in picking up medications due to transportation concerns? No   Upstream Services Reviewed: Is patient disadvantaged to use [facility] Pharmacy?: No  Current Rx insurance plan: Medicare Name and location of Current pharmacy:  [facility] [address] [place] [address] Phone: [phone] Fax: [phone]  [place] [address] Phone: [phone] Fax: [phone]  [facility] Pharmacy services reviewed with patient today?: No  Patient requests to transfer care to [facility] Pharmacy?: No  Reason patient declined to change pharmacies: Loyalty to other pharmacy/Patient preference  Compliance/Adherence/Medication fill history: Care Gaps: Foot exam  Covid  Shingrix  UACR   Star-Rating Drugs: Atorvastatin 80 mg: Last filled 12/08/21 for 90-DS  Metformin 1000 mg: Last filled 12/23/21 for 90-DS   Assessment/Plan  Hypertension (BP goal <130/80) -Controlled -Current treatment: Chlorthalidone 25 mg daily: Appropriate, Effective, Safe, Accessible Takes nightly, denies nocturia  Lisinopril 10 mg daily: Appropriate, Effective, Safe, Accessible  -Medications previously tried: NA  -Current home readings: 110/ -Denies hypotensive/hypertensive symptom -Recommended to continue current medication  Hyperlipidemia: (LDL goal < 70) -Controlled -Potential TIA Sep 2021  -Current treatment: Atorvastatin 80 mg daily: Appropriate, Effective, Safe, Accessible  -Current antiplatelet treatment: Clopidogrel 75 mg daily: Appropriate, Effective, Safe, Accessible -Medications previously  tried: NA   -Recommended to continue current medication  Diabetes (A1c goal <7%) -Controlled  -Complications: frequent UTIs  -Current medications: Metformin 500 mg twice daily: Appropriate, Query effective Ozempic 0.5 mg weekly (Fridays): Appropriate, Query effective Affordability  concerns with Ozempic cost. -Medications previously tried: Trulicity (GI Upset), Jardiance (Yeast infections), Rybelsus (Cost)  -Current home glucose readings fasting glucose: 118, 125,   -Reports hypoglycemic/hyperglycemic symptoms: shaky  -Current meal patterns: Significant appetite reduction with Ozempic. Has lost 6 pounds.  lunch: Skips sometimes or will have light snacks  drinks: Diet coke (2 bottles), Unsweetened tea with lunch, water -Continue current medications   Osteopenia (Goal Prevent fractures) -Controlled -Last DEXA Scan: 09/14/20   T-Score femoral neck: -1.9  T-Score total hip: -1.2  T-Score lumbar spine: -1.9  10-year probability of major osteoporotic fracture: 11.2%  10-year probability of hip fracture: 2.0% -Patient is not a candidate for pharmacologic treatment -Current treatment  Calcium + D3 daily -Medications previously tried: NA  -Recommended to continue current medication  Malva Limes, [place] Pharmacist Practitioner  [facility] [phone]

## 2022-05-03 ENCOUNTER — Ambulatory Visit: Payer: Medicare Other | Admitting: Internal Medicine

## 2022-05-10 NOTE — Progress Notes (Unsigned)
Established Patient Office Visit  Subjective:  Patient ID: Sandy Salinas, female    DOB: 1951/01/27  Age: 72 y.o. MRN: [id number]  CC:  No chief complaint on file.   HPI Sandy Salinas presents for follow up on chronic medical conditions.   Hypertension: -Medications: Chlorthalidone 25 mg, Lisinopril 10 mg -Patient is compliant with above medications and reports no side effects. -Checking BP at home (average): 120-130/70-80 -Denies any SOB, CP, vision changes, LE edema or symptoms of hypotension  HLD: -Medications: Lipitor 80 mg -Patient is compliant with above medications and reports no side effects.  -Last lipid panel: Lipid Panel     Component Value Date/Time   CHOL 126 04/26/2021 1506   CHOL 152 02/18/2015 0841   TRIG 184 (H) 04/26/2021 1506   HDL 33 (L) 04/26/2021 1506   HDL 40 02/18/2015 0841   CHOLHDL 3.8 04/26/2021 1506   VLDL 35 (H) 06/06/2016 0831   LDLCALC 67 04/26/2021 1506   LABVLDL 28 02/18/2015 0841   Diabetes, Type 2: -Last A1c 10/23 6.6% -Medications: Ozempic 0.5 mg weekly ?, Metformin 500 mg BID - tolerating these medications well, has noticed appetite has reduced.  -Medications tried: She cannot take SGLT2's, as she had multiple UTIs while on Jardiance.  We tried glipizide this past month, even with 2.5 dose the patient had an episode of hypoglycemia.  She cannot tolerate any higher doses of metformin due to GI side effects. -Checking BG at home: fasting 107-129 -Lowest home BG since last visit: no hypoglycemic events since Glipizide  -Diet: working on diet, eating more fruit and less processed carbs -Exercise: No regular exercise but active, does yard work -Eye exam: November 2022 -Foot exam: UTD -Microalbumin: UTD 10/23 -Statin: Lipitor 80 mg  -PNA vaccine: UTD -Denies symptoms of hypoglycemia, polyuria, polydipsia, numbness extremities, foot ulcers/trauma.   Osteoporosis:  -Currently on calcium and Vitamin D -DEXA 8/22 showing t score  of -1.9  Memory Loss: -Previously seen by Neuro in 9/21 for an episode of memory loss, considered dementia vs. TIA -EEG and MRI showing ischemic disease, started on Plavix by Dr. 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[place]) 05/19/2016   [place] April 2018, rescan in five years (April 2023)   Breast calcification seen on mammogram 12/01/2016   LEFT   Cataract    Depression 03/18/2021   Diabetes mellitus without complication ([place])    Family history of abdominal aortic aneurysm (AAA) 05/12/2016   father   Hyperlipidemia    Hypertension    Lumbar radiculopathy, acute    Microalbuminuria due to type 2 diabetes mellitus ([place])    Obesity    Osteoporosis    Primary osteoarthritis of left knee    Psoriasis, unspecified    Pyelonephritis 03/17/2021   Seizures ([place])     Past Surgical History:  Procedure Laterality Date   ABDOMINAL HYSTERECTOMY  1975   total   BREAST BIOPSY Right 04/09/2012   x 2, FIBROADENOMA WITH COARSE CALCIFICATIONS.done by byrnett   BREAST BIOPSY  Left 09/04/2017   Affirm Bx- X clip, FIBROADENOMATOUS CHANGE WITH CALCIFICATIONS. NEGATIVE FOR ATYPIA AND MALIGNANCY   CYSTECTOMY  2003   ROUX-EN-Y GASTRIC BYPASS  2009   TONSILLECTOMY  1970   TUBAL LIGATION  1975    Family History  Problem Relation Age of Onset   Alzheimer's disease Mother    Heart murmur Mother    Dementia Mother    Arthritis Mother    AAA (abdominal aortic aneurysm) Father    Heart disease Father    Asthma Sister     Hypertension Sister    Cancer Sister        breast   Breast cancer Sister        late 65's   Heart disease Brother    Hypertension Brother    Aneurysm Paternal Aunt    Lung cancer Paternal Uncle    Cancer Paternal Grandmother        possible, not sure what kind   Cancer Cousin     Social History   Socioeconomic History   Marital status: Married    Spouse name: Doctor, general practice   Number of children: 2   Years of education: Not on file   Highest education level: Associate degree: occupational, Hotel manager, or vocational program  Occupational History   Occupation: retired  Tobacco Use   Smoking status: Never   Smokeless tobacco: Never  Vaping Use   Vaping Use: Never used  Substance and Sexual Activity   Alcohol use: No   Drug use: No   Sexual activity: Not Currently    Birth control/protection: Abstinence    Comment: Old Age  Other Topics Concern   Not on file  Social History Narrative   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    Social Determinants of Health   Financial Resource Strain: Medium Risk (05/04/2022)   Overall Financial Resource Strain (CARDIA)    Difficulty of Paying Living Expenses: Somewhat hard  Food Insecurity: Food Insecurity Present (05/04/2022)   Hunger Vital Sign    Worried About Running Out of Food in the Last Year: Sometimes true    Ran Out of Food in the Last Year: Never true  Transportation Needs: No Transportation Needs (05/04/2022)   PRAPARE - Hydrologist (Medical): No    Lack of Transportation (Non-Medical): No  Physical Activity: Insufficiently Active (05/04/2022)   Exercise Vital Sign    Days of Exercise per Week: 2 days    Minutes of Exercise per Session: 20 min  Stress: Stress Concern Present (05/04/2022)   [place]    Feeling of Stress : To some extent  Social Connections: Moderately Integrated (05/04/2022)   Social Connection and  Isolation Panel [NHANES]    Frequency of Communication with Friends and Family: More than three times a week    Frequency of Social Gatherings with Friends and Family: More than three times a week    Attends Religious Services: More than 4 times per year    Active Member of [employer] or Organizations: No    Attends Archivist Meetings: Never    Marital Status: Married  Human resources officer Violence: Not At Risk (09/14/2020)   Humiliation, Afraid, Rape, and Kick questionnaire    Fear of Current or Ex-Partner: No    Emotionally Abused: No    Physically Abused: No    Sexually Abused: No    Outpatient Medications Prior to Visit  Medication Sig Dispense Refill  Ascorbic Acid (VITAMIN C) 1000 MG tablet Take 1,000 mg by mouth daily.     atorvastatin (LIPITOR) 80 MG tablet TAKE 1 TABLET (80 MG) BY MOUTH AT BEDTIME 90 tablet 3   benzonatate (TESSALON) 100 MG capsule Take 1 capsule (100 mg total) by mouth 2 (two) times daily as needed for cough. 20 capsule 0   blood glucose meter kit and supplies KIT Dispense based on patient and insurance preference. Use to monitor blood sugars once daily as directed. 1 each 0   Calcium Carbonate-Vitamin D (CALTRATE 600+D PO) Take by mouth daily.     chlorthalidone (HYGROTON) 25 MG tablet TAKE 1 TABLET(25 MG) BY MOUTH DAILY 90 tablet 1   DULoxetine (CYMBALTA) 30 MG capsule Take 1 capsule (30 mg total) by mouth at bedtime. 90 capsule 0   lisinopril (ZESTRIL) 10 MG tablet TAKE 1 TABLET(10 MG) BY MOUTH DAILY 90 tablet 1   metFORMIN (GLUCOPHAGE) 1000 MG tablet Take 0.5 tablets (500 mg total) by mouth 2 (two) times daily with a meal. 180 tablet 2   Multiple Vitamin (MULTIVITAMIN) tablet Take 1 tablet by mouth daily. Pt taking QHS     Semaglutide,0.25 or 0.5MG /DOS, (OZEMPIC, 0.25 OR 0.5 MG/DOSE,) 2 MG/3ML SOPN Inject 0.5 mg into the skin once a week. Patient receives via [employer] Patient Assistance through Dec 2023     TURMERIC PO Take 1 capsule by mouth at  bedtime.     vitamin B-12 (CYANOCOBALAMIN) 100 MCG tablet Take by mouth daily. Pt taking 1000 mcg daily QHS     No facility-administered medications prior to visit.    Allergies  Allergen Reactions   Codeine Nausea Only and Other (See Comments)    Hallucinations   Jardiance [Empagliflozin] Other (See Comments)    Recurrent UTI, yeast infection    ROS Review of Systems  Constitutional:  Negative for chills and fever.  Eyes:  Negative for visual disturbance.  Respiratory:  Negative for cough and shortness of breath.   Cardiovascular:  Negative for chest pain.  Gastrointestinal:  Negative for abdominal distention, abdominal pain, diarrhea, nausea and vomiting.  Musculoskeletal:  Positive for arthralgias.      Objective:    Physical Exam Constitutional:      Appearance: Normal appearance.  HENT:     Head: Normocephalic and atraumatic.  Eyes:     Conjunctiva/sclera: Conjunctivae normal.  Cardiovascular:     Rate and Rhythm: Normal rate and regular rhythm.  Pulmonary:     Effort: Pulmonary effort is normal.     Breath sounds: Normal breath sounds.  Musculoskeletal:     Right lower leg: No edema.     Left lower leg: No edema.  Skin:    General: Skin is warm and dry.  Neurological:     General: No focal deficit present.     Mental Status: She is alert. Mental status is at baseline.  Psychiatric:        Mood and Affect: Mood normal.        Behavior: Behavior normal.     There were no vitals taken for this visit. Wt Readings from Last 3 Encounters:  11/08/21 158 lb 1.6 oz (71.7 kg)  08/23/21 159 lb 1.6 oz (72.2 kg)  08/02/21 161 lb 9.6 oz (73.3 kg)     Health Maintenance Due  Topic Date Due   FOOT EXAM  12/14/2021   COVID-19 Vaccine (9 - 2023-24 season) 12/21/2021   Zoster Vaccines- Shingrix (2 of 2) 12/21/2021   Diabetic  kidney evaluation - eGFR measurement  03/18/2022   HEMOGLOBIN A1C  05/10/2022    There are no preventive care reminders to display for  this patient.  Lab Results  Component Value Date   TSH 1.56 02/24/2021   Lab Results  Component Value Date   WBC 13.2 (H) 03/18/2021   HGB 12.5 03/18/2021   HCT 39.6 03/18/2021   MCV 84.1 03/18/2021   PLT 584 (H) 03/18/2021   Lab Results  Component Value Date   NA 137 03/18/2021   K 3.9 03/18/2021   CO2 29 03/18/2021   GLUCOSE 127 (H) 03/18/2021   BUN 13 03/18/2021   CREATININE 0.58 03/18/2021   BILITOT 0.6 03/17/2021   ALKPHOS 61 03/17/2021   AST 18 03/17/2021   ALT 11 03/17/2021   PROT 6.9 03/17/2021   ALBUMIN 3.6 03/17/2021   CALCIUM 9.2 03/18/2021   ANIONGAP 8 03/18/2021   EGFR 79 02/24/2021   Lab Results  Component Value Date   CHOL 126 04/26/2021   Lab Results  Component Value Date   HDL 33 (L) 04/26/2021   Lab Results  Component Value Date   LDLCALC 67 04/26/2021   Lab Results  Component Value Date   TRIG 184 (H) 04/26/2021   Lab Results  Component Value Date   CHOLHDL 3.8 04/26/2021   Lab Results  Component Value Date   HGBA1C 6.6 (A) 11/08/2021      Assessment & Plan:   1. Diabetes mellitus type 2 in obese [facility]): A1c in the office today improved to 6.6%.  Microalbumin obtained today.  Continue metformin 500 mg twice daily, as well as Ozempic 0.5 mg weekly.  We will maintain patient on current Ozempic dose with plans to follow-up in 6 months to recheck.  - POCT HgB A1C - Urine Microalbumin w/creat. ratio  2. Primary hypertension: Stable.  Continue chlorthalidone 25 mg and lisinopril 10 mg daily.  3. Post-viral cough syndrome: Still having some dry cough, especially at night, refill Tessalon Perles.  - benzonatate (TESSALON) 100 MG capsule; Take 1 capsule (100 mg total) by mouth 2 (two) times daily as needed for cough.  Dispense: 20 capsule; Refill: 0  4. Arthritis: Refill Cymbalta 30 mg for pain.   - DULoxetine (CYMBALTA) 30 MG capsule; Take 1 capsule (30 mg total) by mouth at bedtime.  Dispense: 90 capsule; Refill: 0   Follow-up: No  follow-ups on file.    Teodora Medici, DO

## 2022-05-11 ENCOUNTER — Ambulatory Visit (INDEPENDENT_AMBULATORY_CARE_PROVIDER_SITE_OTHER): Payer: Medicare Other | Admitting: Internal Medicine

## 2022-05-11 ENCOUNTER — Encounter: Payer: Self-pay | Admitting: Internal Medicine

## 2022-05-11 VITALS — BP 122/70 | HR 71 | Temp 97.9°F | Resp 16 | Ht 64.0 in | Wt 157.5 lb

## 2022-05-11 DIAGNOSIS — Z1231 Encounter for screening mammogram for malignant neoplasm of breast: Secondary | ICD-10-CM

## 2022-05-11 DIAGNOSIS — E559 Vitamin D deficiency, unspecified: Secondary | ICD-10-CM

## 2022-05-11 DIAGNOSIS — E1165 Type 2 diabetes mellitus with hyperglycemia: Secondary | ICD-10-CM | POA: Diagnosis not present

## 2022-05-11 DIAGNOSIS — M8589 Other specified disorders of bone density and structure, multiple sites: Secondary | ICD-10-CM | POA: Diagnosis not present

## 2022-05-11 DIAGNOSIS — E782 Mixed hyperlipidemia: Secondary | ICD-10-CM | POA: Diagnosis not present

## 2022-05-11 DIAGNOSIS — I10 Essential (primary) hypertension: Secondary | ICD-10-CM

## 2022-05-11 MED ORDER — OZEMPIC (0.25 OR 0.5 MG/DOSE) 2 MG/3ML ~~LOC~~ SOPN: 0.5000 mg | PEN_INJECTOR | SUBCUTANEOUS | 4 refills | Status: DC

## 2022-05-11 MED ORDER — METFORMIN HCL 1000 MG PO TABS: 500.0000 mg | ORAL_TABLET | Freq: Two times a day (BID) | ORAL | 2 refills | Status: DC

## 2022-05-11 MED ORDER — LISINOPRIL 10 MG PO TABS: ORAL_TABLET | ORAL | 1 refills | Status: DC

## 2022-05-11 MED ORDER — CHLORTHALIDONE 25 MG PO TABS
ORAL_TABLET | ORAL | 1 refills | Status: AC
Start: 2022-05-11 — End: ?

## 2022-05-12 LAB — COMPLETE METABOLIC PANEL WITH GFR
AG Ratio: 1.5 (calc) (ref 1.0–2.5)
ALT: 15 U/L (ref 6–29)
AST: 26 U/L (ref 10–35)
Albumin: 4.1 g/dL (ref 3.6–5.1)
Alkaline phosphatase (APISO): 52 U/L (ref 37–153)
BUN: 17 mg/dL (ref 7–25)
CO2: 30 mmol/L (ref 20–32)
Calcium: 9.7 mg/dL (ref 8.6–10.4)
Chloride: 101 mmol/L (ref 98–110)
Creat: 0.88 mg/dL (ref 0.60–1.00)
Globulin: 2.7 g/dL (calc) (ref 1.9–3.7)
Glucose, Bld: 90 mg/dL (ref 65–99)
Potassium: 4.6 mmol/L (ref 3.5–5.3)
Sodium: 140 mmol/L (ref 135–146)
Total Bilirubin: 0.3 mg/dL (ref 0.2–1.2)
Total Protein: 6.8 g/dL (ref 6.1–8.1)
eGFR: 70 mL/min/{1.73_m2} (ref >=60)

## 2022-05-12 LAB — LIPID PANEL
Cholesterol: 142 mg/dL (ref <200)
HDL: 35 mg/dL — ABNORMAL LOW (ref >=50)
LDL Cholesterol (Calc): 79 mg/dL (calc)
Non-HDL Cholesterol (Calc): 107 mg/dL (calc) (ref <130)
Total CHOL/HDL Ratio: 4.1 (calc) (ref <5.0)
Triglycerides: 190 mg/dL — ABNORMAL HIGH (ref <150)

## 2022-05-12 LAB — CBC WITH DIFFERENTIAL/PLATELET
Absolute Monocytes: 598 cells/uL (ref 200–950)
Basophils Absolute: 72 cells/uL (ref 0–200)
Basophils Relative: 1 %
Eosinophils Absolute: 158 cells/uL (ref 15–500)
Eosinophils Relative: 2.2 %
HCT: 38.4 % (ref 35.0–45.0)
Hemoglobin: 12.1 g/dL (ref 11.7–15.5)
Lymphs Abs: 2757.6 cells/uL (ref 850–3900)
MCH: 26.4 pg — ABNORMAL LOW (ref 27.0–33.0)
MCHC: 31.5 g/dL — ABNORMAL LOW (ref 32.0–36.0)
MCV: 83.7 fL (ref 80.0–100.0)
MPV: 10.1 fL (ref 7.5–12.5)
Monocytes Relative: 8.3 %
Neutro Abs: 3614 cells/uL (ref 1500–7800)
Neutrophils Relative %: 50.2 %
Platelets: 536 10*3/uL — ABNORMAL HIGH (ref 140–400)
RBC: 4.59 10*6/uL (ref 3.80–5.10)
RDW: 13.6 % (ref 11.0–15.0)
Total Lymphocyte: 38.3 %
WBC: 7.2 10*3/uL (ref 3.8–10.8)

## 2022-05-12 LAB — HEMOGLOBIN A1C
Hgb A1c MFr Bld: 7.2 % of total Hgb — ABNORMAL HIGH (ref <5.7)
Mean Plasma Glucose: 160 mg/dL
eAG (mmol/L): 8.9 mmol/L

## 2022-05-12 LAB — VITAMIN D 25 HYDROXY (VIT D DEFICIENCY, FRACTURES): Vit D, 25-Hydroxy: 40 ng/mL (ref 30–100)

## 2022-05-16 DIAGNOSIS — M159 Polyosteoarthritis, unspecified: Secondary | ICD-10-CM | POA: Diagnosis not present

## 2022-05-16 DIAGNOSIS — M1712 Unilateral primary osteoarthritis, left knee: Secondary | ICD-10-CM | POA: Diagnosis not present

## 2022-06-01 ENCOUNTER — Ambulatory Visit (INDEPENDENT_AMBULATORY_CARE_PROVIDER_SITE_OTHER): Payer: Medicare Other | Admitting: Physician Assistant

## 2022-06-01 VITALS — BP 131/77 | HR 62 | Temp 98.2°F | Ht 64.02 in | Wt 157.5 lb

## 2022-06-01 DIAGNOSIS — L255 Unspecified contact dermatitis due to plants, except food: Secondary | ICD-10-CM | POA: Diagnosis not present

## 2022-06-01 MED ORDER — PREDNISONE 10 MG PO TABS
ORAL_TABLET | ORAL | 0 refills | Status: AC
Start: 2022-06-01 — End: ?

## 2022-06-01 NOTE — Progress Notes (Signed)
Acute Office Visit   Patient: Sandy Salinas   DOB: 11-30-1950   72 y.o. Female  MRN: [id number] Visit Date: 06/01/2022  Today's healthcare provider: Oswaldo Conroy Kelsea Mousel, PA-C  Introduced myself to the patient as a Secondary school teacher and provided education on APPs in clinical practice.    Chief Complaint  Patient presents with   Rash    Started last night, Rash is under breast, neck and left forearm    Subjective    HPI HPI     Rash    Additional comments: Started last night, Rash is under breast, neck and left forearm       Last edited by Sherolyn Buba, CMA on 06/01/2022  1:23 PM.       RASH Duration:   started last night after working in garden    She reports she was cleaning out her yard on Sat and she knows she has poison ivy/oak in yard   Location:  neck, trunk, and arms  Itching: yes Burning: no Redness: yes Oozing: no Scaling: no Blisters: no Painful: no Fevers: no Change in detergents/soaps/personal care products: no Recent illness: no Recent travel:no History of same: yes she has been exposed to poison ivy in the past with similar reactions Context: fluctuating Alleviating factors: nothing Treatments attempted: calamine lotion but this did not provide much relief  Shortness of breath: no  Throat/tongue swelling: no Myalgias/arthralgias: no  She is taking Metformin 1000 mg PO BID and Ozempic 0.5 mg weekly injection She checks her blood glucose daily- today it was 128     Medications: Outpatient Medications Prior to Visit  Medication Sig   Ascorbic Acid (VITAMIN C) 1000 MG tablet Take 1,000 mg by mouth daily.   atorvastatin (LIPITOR) 80 MG tablet TAKE 1 TABLET (80 MG) BY MOUTH AT BEDTIME   blood glucose meter kit and supplies KIT Dispense based on patient and insurance preference. Use to monitor blood sugars once daily as directed. (Patient not taking: Reported on 05/11/2022)   Calcium Carbonate-Vitamin D (CALTRATE 600+D PO) Take by mouth daily.    chlorthalidone (HYGROTON) 25 MG tablet TAKE 1 TABLET(25 MG) BY MOUTH DAILY   DULoxetine (CYMBALTA) 30 MG capsule Take 1 capsule (30 mg total) by mouth at bedtime.   lisinopril (ZESTRIL) 10 MG tablet TAKE 1 TABLET(10 MG) BY MOUTH DAILY   metFORMIN (GLUCOPHAGE) 1000 MG tablet Take 0.5 tablets (500 mg total) by mouth 2 (two) times daily with a meal.   Multiple Vitamin (MULTIVITAMIN) tablet Take 1 tablet by mouth daily. Pt taking QHS   Semaglutide,0.25 or 0.5MG /DOS, (OZEMPIC, 0.25 OR 0.5 MG/DOSE,) 2 MG/3ML SOPN Inject 0.5 mg into the skin once a week. Patient receives via [employer] Patient Assistance through Dec 2023   TURMERIC PO Take 1 capsule by mouth at bedtime.   vitamin B-12 (CYANOCOBALAMIN) 100 MCG tablet Take by mouth daily. Pt taking 1000 mcg daily QHS   No facility-administered medications prior to visit.    Review of Systems  Skin:  Positive for rash.       Objective    BP 131/77   Pulse 62   Temp 98.2 F (36.8 C) (Oral)   Ht 5' 4.02" (1.626 m)   Wt 157 lb 8 oz (71.4 kg)   SpO2 98%   BMI 27.02 kg/m    Physical Exam Vitals reviewed.  Constitutional:      General: She is awake.     Appearance: Normal appearance.  She is well-developed and well-groomed.  HENT:     Head: Normocephalic and atraumatic.  Pulmonary:     Effort: Pulmonary effort is normal.  Musculoskeletal:     Cervical back: Normal range of motion.  Skin:    General: Skin is warm and dry.     Capillary Refill: Capillary refill takes less than 2 seconds.     Findings: Rash present. Rash is macular.     Comments: Erythematous macular rash under breasts, submandibular area of neck, and behind left ear.   Neurological:     General: No focal deficit present.     Mental Status: She is alert and oriented to person, place, and time.  Psychiatric:        Mood and Affect: Mood normal.        Behavior: Behavior normal. Behavior is cooperative.        Thought Content: Thought content normal.        Judgment:  Judgment normal.       No results found for any visits on 06/01/22.  Assessment & Plan      No follow-ups on file.      Problem List Items Addressed This Visit   None Visit Diagnoses     Dermatitis due to plants, including poison ivy, sumac, and oak    -  Primary Acute, new concern Reports developing itchy rash under breasts and along neck after working in her yard on Sat  PE was notable for erythematous macular rash in areas specified consistent with dermatitis likely from poison oak/ivy/sumac Will send in prolonged Prednisone taper to assist with symptoms and avoid rebound symptoms Reviewed topicals for further relief along with antihistamines- recommendations provided in AVS Reviewed most recent labs and A1c which was 7.2%, typical glucose levels are in 120s per patient report We reviewed continued glucose checks at home and if results are elevated and stay so, we may need to adjust the taper. Patient to follow up with office for concerns Follow up as needed for persistent or progressing symptoms     Relevant Medications   predniSONE (DELTASONE) 10 MG tablet        No follow-ups on file.   I, Nelva Hauk E Deborha Moseley, PA-C, have reviewed all documentation for this visit. The documentation on 06/01/22 for the exam, diagnosis, procedures, and orders are all accurate and complete.   Jacquelin Hawking, MHS, PA-C [facility]

## 2022-06-01 NOTE — Patient Instructions (Signed)
Based on the physical exam today and your answers to my questions I believe you have a condition called allergic contact dermatitis from coming into contact with an irritant (likely poison oak, ivy, sumac, etc)  In order to help you feel better I recommend the following:  Using a topical treatment such as Calamine lotion, Benadryl cream, Cortisone cream as needed Avoiding scratching the rash as this can lead to secondary infection Gently cleanse the skin with warm water and gentle soap- no harsh abrasives or scrubbing is needed  I am sending in a script for a Prednisone taper. This taper is longer than most to help us avoid rebound symptoms from discontinuing too quickly. Steroids can cause the following: sleeplessness, increased appetite, elevated blood sugars, and increased irritability. I recommend taking it in the morning to prevent trouble sleeping and try to make changes to your diet to avoid excess sugar if you are diabetic   You can take an over the counter antihistamine such as Allegra, Zyrtec or Claritin during the day to help with the symptoms If you want  you can take a benadryl at night to help with sleep and itching but it can cause drowsiness so be mindful while using  If you may be in the same area that you were exposed to the plant I recommend the following to reduce further irritation Wear long sleeves and pants with closed shoes and cover your face. Use protective eye-wear to prevent eye contact Once finished in the area with the plant, strip your clothing and place it immediately in the wash to be cleaned on the hottest setting with detergent Take a shower and be sure to wash thoroughly to prevent any of the plant oils from lingering on your skin (if you are exposed again without protective clothing- wash your skin immediately with soap and water)  If you think any pets may have been exposed to these plants they will need to be bathed as well. The oils can stay on their hair  for some time and lead to further exposure and rash for you.   Please let us know if you have any further questions or concerns.   

## 2022-06-06 ENCOUNTER — Encounter: Payer: Self-pay | Admitting: Physician Assistant

## 2022-06-06 ENCOUNTER — Ambulatory Visit (INDEPENDENT_AMBULATORY_CARE_PROVIDER_SITE_OTHER): Payer: Medicare Other | Admitting: Physician Assistant

## 2022-06-06 VITALS — BP 124/64 | HR 98 | Temp 97.9°F | Resp 18 | Ht 64.0 in | Wt 158.6 lb

## 2022-06-06 DIAGNOSIS — L255 Unspecified contact dermatitis due to plants, except food: Secondary | ICD-10-CM | POA: Diagnosis not present

## 2022-06-06 MED ORDER — TRIAMCINOLONE ACETONIDE 0.5 % EX OINT
1.0000 | TOPICAL_OINTMENT | Freq: Two times a day (BID) | CUTANEOUS | 0 refills | Status: AC
Start: 2022-06-06 — End: ?

## 2022-06-06 NOTE — Progress Notes (Signed)
Acute Office Visit   Patient: Sandy Salinas   DOB: 1950-10-14   72 y.o. Female  MRN: [id number] Visit Date: 06/06/2022  Today's healthcare provider: Oswaldo Conroy Daryl Beehler, PA-C  Introduced myself to the patient as a Secondary school teacher and provided education on APPs in clinical practice.    Chief Complaint  Patient presents with   Rash    Poison ivy   Subjective    HPI HPI     Rash    Additional comments: Poison ivy      Last edited by Marcos Eke, CMA on 06/06/2022 10:28 AM.      Poison ivy  Started to develop rash on 05/31/22  She was previously seen by me on 06/01/22 and provided with Prednisone taper to assist with symptoms along with OTC recommendations for antihistamines and topical applications  She reports she has been using Calamine lotion, benadryl tablets and benadryl spray to help with itching She has been splitting her Prednisone doses throughout the day rather than taking all together  She reports continued itching despite the above measures     Medications: Outpatient Medications Prior to Visit  Medication Sig   Ascorbic Acid (VITAMIN C) 1000 MG tablet Take 1,000 mg by mouth daily.   atorvastatin (LIPITOR) 80 MG tablet TAKE 1 TABLET (80 MG) BY MOUTH AT BEDTIME   blood glucose meter kit and supplies KIT Dispense based on patient and insurance preference. Use to monitor blood sugars once daily as directed.   Calcium Carbonate-Vitamin D (CALTRATE 600+D PO) Take by mouth daily.   chlorthalidone (HYGROTON) 25 MG tablet TAKE 1 TABLET(25 MG) BY MOUTH DAILY   DULoxetine (CYMBALTA) 30 MG capsule Take 1 capsule (30 mg total) by mouth at bedtime.   lisinopril (ZESTRIL) 10 MG tablet TAKE 1 TABLET(10 MG) BY MOUTH DAILY   metFORMIN (GLUCOPHAGE) 1000 MG tablet Take 0.5 tablets (500 mg total) by mouth 2 (two) times daily with a meal.   Multiple Vitamin (MULTIVITAMIN) tablet Take 1 tablet by mouth daily. Pt taking QHS   predniSONE (DELTASONE) 10 MG tablet Take 40mg  PO daily  x4d, then 30mg  daily x4d, then 20mg  daily x4d, then 10mg  daily x4 d, then 5mg  daily x4d   Semaglutide,0.25 or 0.5MG /DOS, (OZEMPIC, 0.25 OR 0.5 MG/DOSE,) 2 MG/3ML SOPN Inject 0.5 mg into the skin once a week. Patient receives via [employer] Patient Assistance through Dec 2023   TURMERIC PO Take 1 capsule by mouth at bedtime.   vitamin B-12 (CYANOCOBALAMIN) 100 MCG tablet Take by mouth daily. Pt taking 1000 mcg daily QHS   No facility-administered medications prior to visit.    Review of Systems  Constitutional:  Negative for fatigue and fever.  Respiratory:  Negative for cough, shortness of breath and wheezing.   Skin:  Positive for rash (from poison ivy exposure).       Objective    BP 124/64   Pulse 98   Temp 97.9 F (36.6 C)   Resp 18   Ht 5\' 4"  (1.626 m)   Wt 158 lb 9.6 oz (71.9 kg)   SpO2 98%   BMI 27.22 kg/m    Physical Exam Vitals reviewed.  Constitutional:      General: She is awake.     Appearance: Normal appearance. She is well-developed and well-groomed.  HENT:     Head: Normocephalic and atraumatic.  Pulmonary:     Effort: Pulmonary effort is normal.     Breath sounds: Normal  breath sounds.  Musculoskeletal:     Cervical back: Normal range of motion.  Skin:    General: Skin is warm and dry.     Capillary Refill: Capillary refill takes less than 2 seconds.     Findings: Erythema and rash present. Rash is macular and papular.     Comments: Disseminated rash with isolated maculopapular lesions along trunk and arms.  Isolated maculopapular rash behind left ear  Neurological:     General: No focal deficit present.     Mental Status: She is alert and oriented to person, place, and time.  Psychiatric:        Mood and Affect: Mood normal.        Behavior: Behavior normal. Behavior is cooperative.        Thought Content: Thought content normal.        Judgment: Judgment normal.       No results found for any visits on 06/06/22.  Assessment & Plan       No follow-ups on file.     Problem List Items Addressed This Visit   None Visit Diagnoses     Dermatitis due to plants, including poison ivy, sumac, and oak    -  Primary Acute, ongoing concern Patient was seen by me on 06/01/2022 and provided with prolonged prednisone taper to assist with poison ivy dermatitis She has been using Benadryl tablets as well as Benadryl spray, calamine lotion to assist with symptoms but is not getting much relief We reviewed that it is not recommended to add further steroids such as IM steroid injection especially in patients with diabetes as this can cause increased blood glucose levels Recommend that she add a second-generation antihistamine daily to further assist with reaction Reviewed her use of prednisone and recommend that she takes the full dose once per day rather than breaking up dose into smaller increments throughout the day Will provide triamcinolone ointment to be used over areas that are causing increased itching or irritation-reviewed side effects and recommend that she does not use this for more than 2 weeks in 1 particular area as this can cause skin thinning Recommend that she continues to use calamine lotion and Benadryl as desired Follow-up as needed for persistent or progressing symptoms.   Relevant Medications   triamcinolone ointment (KENALOG) 0.5 %        No follow-ups on file.   I, Elleen Coulibaly E Bookert Guzzi, PA-C, have reviewed all documentation for this visit. The documentation on 06/06/22 for the exam, diagnosis, procedures, and orders are all accurate and complete.   Jacquelin Hawking, MHS, PA-C [facility]

## 2022-07-27 DIAGNOSIS — M159 Polyosteoarthritis, unspecified: Secondary | ICD-10-CM | POA: Diagnosis not present

## 2022-07-27 DIAGNOSIS — G8929 Other chronic pain: Secondary | ICD-10-CM | POA: Diagnosis not present

## 2022-07-27 DIAGNOSIS — M1712 Unilateral primary osteoarthritis, left knee: Secondary | ICD-10-CM | POA: Diagnosis not present

## 2022-07-27 DIAGNOSIS — M25562 Pain in left knee: Secondary | ICD-10-CM | POA: Diagnosis not present

## 2022-08-06 ENCOUNTER — Other Ambulatory Visit: Payer: Self-pay | Admitting: Internal Medicine

## 2022-08-06 DIAGNOSIS — E782 Mixed hyperlipidemia: Secondary | ICD-10-CM

## 2022-08-08 NOTE — Telephone Encounter (Signed)
Requested Prescriptions  Pending Prescriptions Disp Refills   atorvastatin (LIPITOR) 80 MG tablet [Pharmacy Med Name: ATORVASTATIN 80MG  TABLETS] 90 tablet 3    Sig: TAKE 1 TABLET(80 MG) BY MOUTH AT BEDTIME     Cardiovascular:  Antilipid - Statins Failed - 08/06/2022  7:19 AM      Failed - Lipid Panel in normal range within the last 12 months    Cholesterol, Total  Date Value Ref Range Status  02/18/2015 152 100 - 199 mg/dL Final   Cholesterol  Date Value Ref Range Status  05/11/2022 142 <200 mg/dL Final   LDL Cholesterol (Calc)  Date Value Ref Range Status  05/11/2022 79 mg/dL (calc) Final    Comment:    Reference range: <100 . Desirable range <100 mg/dL for primary prevention;   <70 mg/dL for patients with CHD or diabetic patients  with > or = 2 CHD risk factors. Marland Kitchen LDL-C is now calculated using the Martin-Hopkins  calculation, which is a validated novel method providing  better accuracy than the Friedewald equation in the  estimation of LDL-C.  Horald Pollen et al. Lenox Ahr. 7829;562(13): 2061-2068  (http://education.[employer].com/faq/FAQ164)    HDL  Date Value Ref Range Status  05/11/2022 35 (L) > OR = 50 mg/dL Final  08/65/7846 40 >96 mg/dL Final   Triglycerides  Date Value Ref Range Status  05/11/2022 190 (H) <150 mg/dL Final         Passed - Patient is not pregnant      Passed - Valid encounter within last 12 months    Recent Outpatient Visits           2 months ago Dermatitis due to plants, including poison ivy, sumac, and oak   [place] [facility] Mecum, Erin E, PA-C   2 months ago Dermatitis due to plants, including poison ivy, sumac, and oak   [place] [employer], Erin E, PA-C   2 months ago Type 2 diabetes mellitus with hyperglycemia, without long-term current use of insulin [facility])    [facility] Margarita Mail, DO   9 months ago Diabetes mellitus type 2 in obese [facility])   [facility] Margarita Mail, DO   11 months ago Upper respiratory tract infection, unspecified type   [facility] Margarita Mail, [place]

## 2022-08-22 NOTE — Telephone Encounter (Signed)
Copied from CRM [id number]. Topic: Referral - Request for Referral >> Aug 22, 2022  8:08 AM Clide Dales wrote: Has patient seen PCP for this complaint? No. *If NO, is insurance requiring patient see PCP for this issue before PCP can refer them? Referral for which specialty: Dermatology Preferred provider/office: [facility] Dr. Gwen Pounds Reason for referral: spots on face

## 2022-09-05 DIAGNOSIS — M65321 Trigger finger, right index finger: Secondary | ICD-10-CM | POA: Diagnosis not present

## 2022-09-05 DIAGNOSIS — M159 Polyosteoarthritis, unspecified: Secondary | ICD-10-CM | POA: Diagnosis not present

## 2022-09-14 ENCOUNTER — Encounter: Payer: Self-pay | Admitting: Internal Medicine

## 2022-09-15 ENCOUNTER — Other Ambulatory Visit: Payer: Self-pay | Admitting: Internal Medicine

## 2022-09-15 DIAGNOSIS — L989 Disorder of the skin and subcutaneous tissue, unspecified: Secondary | ICD-10-CM

## 2022-09-25 DIAGNOSIS — L821 Other seborrheic keratosis: Secondary | ICD-10-CM | POA: Diagnosis not present

## 2022-09-25 DIAGNOSIS — D225 Melanocytic nevi of trunk: Secondary | ICD-10-CM | POA: Diagnosis not present

## 2022-09-25 DIAGNOSIS — D235 Other benign neoplasm of skin of trunk: Secondary | ICD-10-CM | POA: Diagnosis not present

## 2022-09-25 DIAGNOSIS — L814 Other melanin hyperpigmentation: Secondary | ICD-10-CM | POA: Diagnosis not present

## 2022-09-25 DIAGNOSIS — L570 Actinic keratosis: Secondary | ICD-10-CM | POA: Diagnosis not present

## 2022-09-25 DIAGNOSIS — L820 Inflamed seborrheic keratosis: Secondary | ICD-10-CM | POA: Diagnosis not present

## 2022-09-25 DIAGNOSIS — D485 Neoplasm of uncertain behavior of skin: Secondary | ICD-10-CM | POA: Diagnosis not present

## 2022-09-25 DIAGNOSIS — L578 Other skin changes due to chronic exposure to nonionizing radiation: Secondary | ICD-10-CM | POA: Diagnosis not present

## 2022-10-23 DIAGNOSIS — Z23 Encounter for immunization: Secondary | ICD-10-CM | POA: Diagnosis not present

## 2022-10-24 ENCOUNTER — Ambulatory Visit
Admission: RE | Admit: 2022-10-24 | Discharge: 2022-10-24 | Disposition: A | Discharge status: Home / Self Care | Payer: Medicare Other | Source: Ambulatory Visit | Attending: Internal Medicine

## 2022-10-24 ENCOUNTER — Ambulatory Visit
Admission: RE | Admit: 2022-10-24 | Discharge: 2022-10-24 | Disposition: A | Discharge status: Home / Self Care | Payer: Medicare Other | Source: Ambulatory Visit | Attending: Internal Medicine | Admitting: Internal Medicine

## 2022-10-24 DIAGNOSIS — Z1231 Encounter for screening mammogram for malignant neoplasm of breast: Secondary | ICD-10-CM | POA: Insufficient documentation

## 2022-10-24 DIAGNOSIS — Z780 Asymptomatic menopausal state: Secondary | ICD-10-CM | POA: Diagnosis not present

## 2022-10-24 DIAGNOSIS — M810 Age-related osteoporosis without current pathological fracture: Secondary | ICD-10-CM | POA: Diagnosis not present

## 2022-10-24 DIAGNOSIS — M8589 Other specified disorders of bone density and structure, multiple sites: Secondary | ICD-10-CM | POA: Insufficient documentation

## 2022-11-04 ENCOUNTER — Other Ambulatory Visit: Payer: Self-pay | Admitting: Internal Medicine

## 2022-11-04 DIAGNOSIS — I10 Essential (primary) hypertension: Secondary | ICD-10-CM

## 2022-11-06 ENCOUNTER — Ambulatory Visit: Payer: Self-pay

## 2022-11-06 NOTE — Telephone Encounter (Signed)
Requested Prescriptions  Pending Prescriptions Disp Refills   chlorthalidone (HYGROTON) 25 MG tablet [Pharmacy Med Name: CHLORTHALIDONE 25MG  TABLETS] 90 tablet 0    Sig: TAKE 1 TABLET(25 MG) BY MOUTH DAILY     Cardiovascular: Diuretics - Thiazide Passed - 11/04/2022  7:28 AM      Passed - Cr in normal range and within 180 days    Creat  Date Value Ref Range Status  05/11/2022 0.88 0.60 - 1.00 mg/dL Final   Creatinine, Urine  Date Value Ref Range Status  11/08/2021 29 20 - 275 mg/dL Final         Passed - K in normal range and within 180 days    Potassium  Date Value Ref Range Status  05/11/2022 4.6 3.5 - 5.3 mmol/L Final         Passed - Na in normal range and within 180 days    Sodium  Date Value Ref Range Status  05/11/2022 140 135 - 146 mmol/L Final  02/18/2015 141 134 - 144 mmol/L Final         Passed - Last BP in normal range    BP Readings from Last 1 Encounters:  06/06/22 124/64         Passed - Valid encounter within last 6 months    Recent Outpatient Visits           5 months ago Dermatitis due to plants, including poison ivy, sumac, and oak   [place] [facility] Mecum, Erin E, PA-C   5 months ago Dermatitis due to plants, including poison ivy, sumac, and oak   [place] [employer], Erin E, PA-C   5 months ago Type 2 diabetes mellitus with hyperglycemia, without long-term current use of insulin [facility])   [place] [facility] Margarita Mail, DO   12 months ago Diabetes mellitus type 2 in obese [facility])   [place] [facility] Margarita Mail, DO   1 year ago Upper respiratory tract infection, unspecified type   [facility] Margarita Mail, DO       Future Appointments             Tomorrow Margarita Mail, DO [place] [facility], [facility]   In 1 week Margarita Mail, DO [place] [facility], [facility]              lisinopril (ZESTRIL) 10 MG tablet [Pharmacy Med Name: LISINOPRIL 10MG  TABLETS] 90 tablet 0    Sig: TAKE 1 TABLET(10 MG) BY MOUTH DAILY     Cardiovascular:  ACE Inhibitors Passed - 11/04/2022  7:28 AM      Passed - Cr in normal range and within 180 days    Creat  Date Value Ref Range Status  05/11/2022 0.88 0.60 - 1.00 mg/dL Final   Creatinine, Urine  Date Value Ref Range Status  11/08/2021 29 20 - 275 mg/dL Final         Passed - K in normal range and within 180 days    Potassium  Date Value Ref Range Status  05/11/2022 4.6 3.5 - 5.3 mmol/L Final         Passed - Patient is not pregnant      Passed - Last BP in normal range    BP Readings from Last 1 Encounters:  06/06/22 124/64         Passed - Valid encounter within last 6 months    Recent  Outpatient Visits           5 months ago Dermatitis due to plants, including poison ivy, sumac, and oak   [place] [facility] Mecum, Oswaldo Conroy, PA-C   5 months ago Dermatitis due to plants, including poison ivy, sumac, and oak   [place] [employer], Oswaldo Conroy, PA-C   5 months ago Type 2 diabetes mellitus with hyperglycemia, without long-term current use of insulin [facility])   [place] [facility] Margarita Mail, DO   12 months ago Diabetes mellitus type 2 in obese [facility])   [place] [facility] Margarita Mail, DO   1 year ago Upper respiratory tract infection, unspecified type   [facility] Margarita Mail, DO       Future Appointments             Tomorrow Margarita Mail, DO  [facility], [facility]   In 1 week Margarita Mail, DO [facility], [facility]

## 2022-11-06 NOTE — Telephone Encounter (Signed)
Summary: Swelling of ankles, dizziness Advice   Pt is calling to report headache, and swelling of ankles, and dizziness & light headedness for three days. BP reporting 155/81, 160/86, 138/86. Please advise       Chief Complaint: Dizziness,headache, had ankle swelling is better today. Symptoms: Above Frequency: Thursday Pertinent Negatives: Patient denies fever, chest pain Disposition: [] ED /[] Urgent Care (no appt availability in office) / [x] Appointment(In office/virtual)/ []  [place] [facility]/ [] Home Care/ [] Refused Recommended Disposition /[] [place] [facility]/ []  Follow-up with PCP Additional Notes: Agrees with appointment. Wants to see Dr. Caralee Ates only.  Reason for Disposition  [1] MODERATE dizziness (e.g., interferes with normal activities) AND [2] has NOT been evaluated by doctor (or NP/PA) for this  (Exception: Dizziness caused by heat exposure, sudden standing, or poor fluid intake.)  Answer Assessment - Initial Assessment Questions 1. DESCRIPTION: "Describe your dizziness."     Dizzy 2. LIGHTHEADED: "Do you feel lightheaded?" (e.g., somewhat faint, woozy, weak upon standing)     Woozy 3. VERTIGO: "Do you feel like either you or the room is spinning or tilting?" (i.e. vertigo)     No 4. SEVERITY: "How bad is it?"  "Do you feel like you are going to faint?" "Can you stand and walk?"   - MILD: Feels slightly dizzy, but walking normally.   - MODERATE: Feels unsteady when walking, but not falling; interferes with normal activities (e.g., school, work).   - SEVERE: Unable to walk without falling, or requires assistance to walk without falling; feels like passing out now.      Mild-moderate 5. ONSET:  "When did the dizziness begin?"     Thursday 6. AGGRAVATING FACTORS: "Does anything make it worse?" (e.g., standing, change in head position)     Getting 7. HEART RATE: "Can you tell me your heart rate?" "How many beats in 15 seconds?"  (Note: not all patients can do this)        No 8. CAUSE: "What do you think is causing the dizziness?"     Unsure 9. RECURRENT SYMPTOM: "Have you had dizziness before?" If Yes, ask: "When was the last time?" "What happened that time?"     No 10. OTHER SYMPTOMS: "Do you have any other symptoms?" (e.g., fever, chest pain, vomiting, diarrhea, bleeding)       Headache 11. PREGNANCY: "Is there any chance you are pregnant?" "When was your last menstrual period?"       No  Protocols used: Dizziness - Lightheadedness-A-AH

## 2022-11-06 NOTE — Progress Notes (Unsigned)
Acute Office Visit  Subjective:     Patient ID: [name], female    DOB: 11/05/1950, 72 y.o.   MRN: [id number]  No chief complaint on file.   HPI Patient is in today for dizziness, headache.   URI Compliant:   -Worst symptom: -Fever: {Blank single:19197::"yes","no"} -Cough: {Blank single:19197::"yes","no"} -Shortness of breath: {Blank single:19197::"yes","no"} -Wheezing: {Blank single:19197::"yes","no"} -Chest pain: {Blank single:19197::"yes","no","yes, with cough"} -Chest tightness: {Blank single:19197::"yes","no"} -Chest congestion: {Blank single:19197::"yes","no"} -Nasal congestion: {Blank single:19197::"yes","no"} -Runny nose: {Blank single:19197::"yes","no"} -Post nasal drip: {Blank single:19197::"yes","no"} -Sneezing: {Blank single:19197::"yes","no"} -Sore throat: {Blank single:19197::"yes","no"} -Swollen glands: {Blank single:19197::"yes","no"} -Sinus pressure: {Blank single:19197::"yes","no"} -Headache: {Blank single:19197::"yes","no"} -Face pain: {Blank single:19197::"yes","no"} -Toothache: {Blank single:19197::"yes","no"} -Ear pain: {Blank single:19197::"yes","no"} {Blank single:19197::""right","left", "bilateral"} -Ear pressure: {Blank single:19197::"yes","no"} {Blank single:19197::""right","left", "bilateral"} -Eyes red/itching:{Blank single:19197::"yes","no"} -Eye drainage/crusting: {Blank single:19197::"yes","no"}  -Vomiting: {Blank single:19197::"yes","no"} -Rash: {Blank single:19197::"yes","no"} -Fatigue: {Blank single:19197::"yes","no"} -Sick contacts: {Blank single:19197::"yes","no"} -Strep contacts: {Blank single:19197::"yes","no"}  -Context: {Blank multiple:19196::"better","worse","stable","fluctuating"} -Recurrent sinusitis: {Blank single:19197::"yes","no"} -Relief with OTC cold/cough medications: {Blank single:19197::"yes","no"}  -Treatments attempted: {Blank  multiple:19196::"none","cold/sinus","mucinex","anti-histamine","pseudoephedrine","cough syrup","antibiotics"}    ROS      Objective:    There were no vitals taken for this visit. {Vitals History (Optional):23777}  Physical Exam  No results found for any visits on 11/07/22.      Assessment & Plan:   Problem List Items Addressed This Visit   None   No orders of the defined types were placed in this encounter.   No follow-ups on file.  Margarita Mail, DO

## 2022-11-07 ENCOUNTER — Ambulatory Visit: Payer: Medicare Other | Admitting: Internal Medicine

## 2022-11-07 ENCOUNTER — Encounter: Payer: Self-pay | Admitting: Internal Medicine

## 2022-11-07 VITALS — BP 138/80 | HR 83 | Temp 98.0°F | Resp 18 | Ht 64.0 in | Wt 164.8 lb

## 2022-11-07 DIAGNOSIS — I10 Essential (primary) hypertension: Secondary | ICD-10-CM | POA: Diagnosis not present

## 2022-11-07 DIAGNOSIS — R519 Headache, unspecified: Secondary | ICD-10-CM

## 2022-11-07 DIAGNOSIS — E1165 Type 2 diabetes mellitus with hyperglycemia: Secondary | ICD-10-CM

## 2022-11-07 DIAGNOSIS — Z7985 Long-term (current) use of injectable non-insulin antidiabetic drugs: Secondary | ICD-10-CM

## 2022-11-07 LAB — POCT GLYCOSYLATED HEMOGLOBIN (HGB A1C): Hemoglobin A1C: 7.2 % — AB (ref 4.0–5.6)

## 2022-11-07 MED ORDER — OZEMPIC (0.25 OR 0.5 MG/DOSE) 2 MG/3ML ~~LOC~~ SOPN
0.2500 mg | PEN_INJECTOR | SUBCUTANEOUS | 0 refills | Status: DC
Start: 2022-11-07 — End: 2023-12-19

## 2022-11-07 MED ORDER — LISINOPRIL 20 MG PO TABS: 20.0000 mg | ORAL_TABLET | Freq: Every day | ORAL | 0 refills | Status: DC

## 2022-11-08 ENCOUNTER — Other Ambulatory Visit: Payer: Medicare Other | Admitting: Pharmacist

## 2022-11-08 ENCOUNTER — Encounter: Payer: Self-pay | Admitting: Internal Medicine

## 2022-11-08 ENCOUNTER — Encounter: Payer: Self-pay | Admitting: Pharmacist

## 2022-11-08 ENCOUNTER — Other Ambulatory Visit: Payer: Self-pay | Admitting: Internal Medicine

## 2022-11-08 DIAGNOSIS — Z79899 Other long term (current) drug therapy: Secondary | ICD-10-CM

## 2022-11-08 DIAGNOSIS — E1165 Type 2 diabetes mellitus with hyperglycemia: Secondary | ICD-10-CM

## 2022-11-08 LAB — MICROALBUMIN / CREATININE URINE RATIO
Creatinine, Urine: 27 mg/dL (ref 20–275)
Microalb, Ur: <0.2 mg/dL

## 2022-11-08 NOTE — Patient Instructions (Signed)
Goals Addressed             This Visit's Progress    Pharmacy Goals       The goal A1c is less than 7%. This is the best way to reduce the risk of the long term complications of diabetes, including heart disease, kidney disease, eye disease, strokes, and nerve damage. An A1c of less than 7% corresponds with fasting sugars less than 130 and 2 hour after meal sugars less than 180.  Please watch the mail for an envelope from [employer] containing the patient assistance program application. Please complete this application and mail back to [facility] Technician Noreene Larsson [place] along with a copy of your Medicare Part D prescription card and a copy of your proof of income document OR you can bring these documents to the office to have them faxed back to Attention: Pattricia Boss at Fax # [phone]   If you need to call Noreene Larsson, you can reach her at [phone]   In the future, if you need to reach out to the patient assistance program regarding refills or to find out the status of your application, you can do so by calling:   [employer] at [phone]     Thank you!   Estelle Grumbles, PharmD, [facility] [phone]

## 2022-11-08 NOTE — Progress Notes (Signed)
11/08/2022 Name: Sandy Salinas MRN: [id number] DOB: 1950-03-24  Chief Complaint  Patient presents with   Medication Assistance    Sandy Salinas is a 72 y.o. year old female who presented for a telephone visit.   They were referred to the pharmacist by their PCP for assistance in managing medication access.    Subjective:  Care Team: Primary Care Provider: Margarita Mail, DO ; Next Scheduled Visit: 11/13/2022 Rheumatology: Tracey Harries, MD; Next Scheduled Visit: 12/27/2022  Medication Access/Adherence  Current Pharmacy:  [id number] - [place], [place] - [address] AT [facility] [address] [place] [address] [place] [address] Phone: [phone] Fax: [phone]  [facility] [address] [place] [address] Phone: [phone] Fax: [phone]   Patient reports affordability concerns with their medications: Yes  Patient reports access/transportation concerns to their pharmacy: No  Patient reports adherence concerns with their medications:  No    Reports uses a weekly pillbox to organize her medications   Diabetes:  Current medications:  - metformin 1000 mg - 1/2 tablet (500 mg) twice daily with meals - Reports previously on Ozempic, but off for the past month as ran out of medication  Denies checking home blood sugar recently  Statin therapy: atorvastatin 80 mg daily  Current medication access support: Per review of chart, patient previously enrolled in patient assistance for Ozempic from [employer] patient assistance program on behalf of patient. Speak with representative Nicole Cella who advises patient previously enrolled under a status of no prescription insurance and her latest enrollment ended 07/15/2022; last order was shipped 04/27/2022. Patient will need to submit a new application for re-enrollment   Hypertension:  Current medications:  -  chlorthalidone 25 mg daily - lisinopril 20 mg daily  Current blood pressure readings readings: last checked last night, reading: 127/70, HR 63   Objective:  Lab Results  Component Value Date   HGBA1C 7.2 (A) 11/07/2022    Lab Results  Component Value Date   CREATININE 0.88 05/11/2022   BUN 17 05/11/2022   NA 140 05/11/2022   K 4.6 05/11/2022   CL 101 05/11/2022   CO2 30 05/11/2022    Lab Results  Component Value Date   CHOL 142 05/11/2022   HDL 35 (L) 05/11/2022   LDLCALC 79 05/11/2022   TRIG 190 (H) 05/11/2022   CHOLHDL 4.1 05/11/2022    Medications Reviewed Today     Reviewed by Manuela Neptune, RPH-CPP (Pharmacist) on 11/08/22 at 1011  Med List Status: <None>   Medication Order Taking? Sig Documenting Provider Last Dose Status Informant  Ascorbic Acid (VITAMIN C) 1000 MG tablet [id number] Yes Take 1,000 mg by mouth daily. [provider] Taking Active   atorvastatin (LIPITOR) 80 MG tablet [id number] Yes TAKE 1 TABLET(80 MG) BY MOUTH AT BEDTIME Margarita Mail, DO Taking Active   blood glucose meter kit and supplies KIT [id number]  Dispense based on patient and insurance preference. Use to monitor blood sugars once daily as directed. Margarita Mail, DO  Active   Calcium Carbonate-Vitamin D (CALTRATE 600+D PO) [id number] Yes Take by mouth daily. [provider] Taking Active Multiple Informants           Med Note Reather Littler D   Tue Aug 19, 2019  3:01 PM)    chlorthalidone (HYGROTON) 25 MG tablet [id number] Yes TAKE 1 TABLET(25 MG) BY MOUTH DAILY Caralee Ates,  Gentry Fitz, DO Taking Active   DULoxetine (CYMBALTA) 30 MG capsule [id number] Yes Take 1 capsule (30 mg total) by mouth at bedtime. Margarita Mail, DO Taking Active   lisinopril (ZESTRIL) 20 MG tablet [id number] Yes Take 1 tablet (20 mg total) by mouth daily. Margarita Mail, DO Taking Active   metFORMIN (GLUCOPHAGE) 1000 MG tablet [id number] Yes Take 0.5 tablets (500 mg total) by mouth 2  (two) times daily with a meal. Margarita Mail, DO Taking Active   Multiple Vitamin (MULTIVITAMIN) tablet [id number] Yes Take 1 tablet by mouth daily. Pt taking QHS [provider] Taking Active Multiple Informants  Semaglutide,0.25 or 0.5MG /DOS, (OZEMPIC, 0.25 OR 0.5 MG/DOSE,) 2 MG/3ML SOPN [id number]  Inject 0.25 mg into the skin once a week. Margarita Mail, DO  Active   vitamin B-12 (CYANOCOBALAMIN) 100 MCG tablet [id number] Yes Take by mouth daily. Pt taking 1000 mcg daily QHS [provider] Taking Active Multiple Informants              Assessment/Plan:   Comprehensive medication review performed; medication list updated in electronic medical record   Diabetes: - Reviewed dietary modifications including importance of having regular well-balanced meals throughout the day, while controlling carbohydrate portion sizes - Recommend to check glucose, keep log of results and have this record to review at upcoming medical appointments. Patient to contact provider office sooner if needed for readings outside of established parameters or symptoms - Meets financial criteria for Ozempic patient assistance program through [employer]. Will collaborate with provider, CPhT, and patient to pursue assistance.    Hypertension: - Recommend to monitor home blood pressure, keep log of results and have this record to review at upcoming medical appointments. Patient to contact provider office sooner if needed for readings outside of established parameters or symptoms    Follow Up Plan: Clinical Pharmacist will follow up with patient by telephone on 12/13/2022 at 9:30 am  Estelle Grumbles, PharmD, [facility] [phone]

## 2022-11-10 ENCOUNTER — Telehealth: Payer: Self-pay | Admitting: Pharmacy Technician

## 2022-11-10 DIAGNOSIS — Z5986 Financial insecurity: Secondary | ICD-10-CM

## 2022-11-10 NOTE — Progress Notes (Signed)
[employer] Customer service manager [facility])                                            [facility] Quality Pharmacy Team    11/10/2022  Sandy Salinas [birth year] [id number]                                      Medication Assistance Referral  Referral From:  [facility] PharmD Estelle Grumbles  Medication/Company: [employer] / [employer] Patient application portion:  Mailed Provider application portion: Faxed  to Dr. Margarita Mail Provider address/fax verified via: Office website  Pattricia Boss, CPhT [place]  Office: [phone] Fax: [phone] Email: [email or web address]

## 2022-11-12 NOTE — Progress Notes (Unsigned)
Acute Office Visit  Subjective:     Patient ID: Sandy Salinas, female    DOB: [birth year], 72 y.o.   MRN: [id number]  No chief complaint on file.   HPI Patient is in today for hip pain.  HIP PAIN Duration: {Blank single:19197::"chronic","days","weeks","months"} Involved hip: {Blank single:19197::"left","right","bilateral"}  Mechanism of injury: {Blank single:19197::"trauma","unknown"} Location: {Blank single:19197::"anterior","posterior","lateral","medial","diffuse"} Onset: {Blank single:19197::"sudden","gradual"}  Severity: {Blank single:19197::"mild","moderate","severe","1/10","2/10","3/10","4/10","5/10","6/10","7/10","8/10","9/10","10/10"}  Quality: {Blank multiple:19196::"sharp","dull","aching","burning","cramping","ill-defined","itchy","pressure-like","pulling","shooting","sore","stabbing","tender","tearing","throbbing"} Frequency: {Blank single:19197::"constant","intermittent","occasional","rare","every few minutes","a few times a hour","a few times a day","a few times a week","a few times a month","a few times a year"} Radiation: {Blank single:19197::"yes","no"} Aggravating factors: {Blank multiple:19196::"weight bearing","walking","running","stairs","bending","movement","prolonged sitting"}   Alleviating factors: {Blank multiple:19196::"nothing","ice","physical therapy","HEP","APAP","NSAIDs","crutches","rest"}  Status: {Blank multiple:19196::"better","worse","stable","fluctuating"} Treatments attempted: {Blank multiple:19196::"none","rest","ice","heat","APAP","ibuprofen","aleve","physical therapy","HEP","chiropractor"}   Relief with NSAIDs?: {Blank single:19197::"No NSAIDs Taken","no","mild","moderate","significant"} Weakness with weight bearing: {Blank single:19197::"yes","no"} Weakness with walking: {Blank single:19197::"yes","no"} Paresthesias / decreased sensation: {Blank single:19197::"yes","no"} Swelling: {Blank single:19197::"yes","no"} Redness:{Blank  single:19197::"yes","no"} Fevers: {Blank single:19197::"yes","no"}   ROS      Objective:    There were no vitals taken for this visit. {Vitals History (Optional):23777}  Physical Exam  No results found for any visits on 11/13/22.      Assessment & Plan:   Problem List Items Addressed This Visit   None   No orders of the defined types were placed in this encounter.   No follow-ups on file.  Margarita Mail, DO

## 2022-11-13 ENCOUNTER — Ambulatory Visit (INDEPENDENT_AMBULATORY_CARE_PROVIDER_SITE_OTHER): Payer: Medicare Other | Admitting: Internal Medicine

## 2022-11-13 ENCOUNTER — Encounter: Payer: Self-pay | Admitting: Internal Medicine

## 2022-11-13 ENCOUNTER — Ambulatory Visit: Payer: Medicare Other | Admitting: Internal Medicine

## 2022-11-13 VITALS — BP 122/70 | HR 82 | Temp 98.2°F | Resp 18 | Ht 64.0 in | Wt 164.0 lb

## 2022-11-13 DIAGNOSIS — M816 Localized osteoporosis [Lequesne]: Secondary | ICD-10-CM

## 2022-11-13 DIAGNOSIS — M7062 Trochanteric bursitis, left hip: Secondary | ICD-10-CM

## 2022-11-13 MED ORDER — METHYLPREDNISOLONE 4 MG PO TBPK
ORAL_TABLET | ORAL | 0 refills | Status: DC
Start: 2022-11-13 — End: 2023-04-19

## 2022-11-13 NOTE — Patient Instructions (Signed)
Denosumab Injection (Osteoporosis) What is this medication? DENOSUMAB (den oh SUE mab) prevents and treats osteoporosis. It works by making your bones stronger and less likely to break (fracture). It is a monoclonal antibody. This medicine may be used for other purposes; ask your health care provider or pharmacist if you have questions. COMMON BRAND NAME(S): Prolia What should I tell my care team before I take this medication? They need to know if you have any of these conditions: Dental or gum disease Had thyroid or parathyroid (glands located in neck) surgery Having dental surgery or a tooth pulled Kidney disease Low levels of calcium in the blood On dialysis Poor nutrition Thyroid disease Trouble absorbing nutrients from your food An unusual or allergic reaction to denosumab, other medications, foods, dyes, or preservatives Pregnant or trying to get pregnant Breastfeeding How should I use this medication? This medication is injected under the skin. It is given by your care team in a hospital or clinic setting. A special MedGuide will be given to you before each treatment. Be sure to read this information carefully each time. Talk to your care team about the use of this medication in children. Special care may be needed. Overdosage: If you think you have taken too much of this medicine contact a poison control center or emergency room at once. NOTE: This medicine is only for you. Do not share this medicine with others. What if I miss a dose? Keep appointments for follow-up doses. It is important not to miss your dose. Call your care team if you are unable to keep an appointment. What may interact with this medication? Do not take this medication with any of the following: Other medications that contain denosumab This medication may also interact with the following: Medications that lower your chance of fighting infection Steroid medications, such as prednisone or cortisone This  list may not describe all possible interactions. Give your health care provider a list of all the medicines, herbs, non-prescription drugs, or dietary supplements you use. Also tell them if you smoke, drink alcohol, or use illegal drugs. Some items may interact with your medicine. What should I watch for while using this medication? Your condition will be monitored carefully while you are receiving this medication. You may need blood work done while taking this medication. This medication may increase your risk of getting an infection. Call your care team for advice if you get a fever, chills, sore throat, or other symptoms of a cold or flu. Do not treat yourself. Try to avoid being around people who are sick. Tell your dentist and dental surgeon that you are taking this medication. You should not have major dental surgery while on this medication. See your dentist to have a dental exam and fix any dental problems before starting this medication. Take good care of your teeth while on this medication. Make sure you see your dentist for regular follow-up appointments. This medication may cause low levels of calcium in your body. The risk of severe side effects is increased in people with kidney disease. Your care team may prescribe calcium and vitamin D to help prevent low calcium levels while you take this medication. It is important to take calcium and vitamin D as directed by your care team. Talk to your care team if you may be pregnant. Serious birth defects may occur if you take this medication during pregnancy and for 5 months after the last dose. You will need a negative pregnancy test before starting this medication. Contraception   is recommended while taking this medication and for 5 months after the last dose. Your care team can help you find the option that works for you. Talk to your care team before breastfeeding. Changes to your treatment plan may be needed. What side effects may I notice from  receiving this medication? Side effects that you should report to your care team as soon as possible: Allergic reactions--skin rash, itching, hives, swelling of the face, lips, tongue, or throat Infection--fever, chills, cough, sore throat, wounds that don't heal, pain or trouble when passing urine, general feeling of discomfort or being unwell Low calcium level--muscle pain or cramps, confusion, tingling, or numbness in the hands or feet Osteonecrosis of the jaw--pain, swelling, or redness in the mouth, numbness of the jaw, poor healing after dental work, unusual discharge from the mouth, visible bones in the mouth Severe bone, joint, or muscle pain Skin infection--skin redness, swelling, warmth, or pain Side effects that usually do not require medical attention (report these to your care team if they continue or are bothersome): Back pain Headache Joint pain Muscle pain Pain in the hands, arms, legs, or feet Runny or stuffy nose Sore throat This list may not describe all possible side effects. Call your doctor for medical advice about side effects. You may report side effects to FDA at 1-800-FDA-1088. Where should I keep my medication? This medication is given in a hospital or clinic. It will not be stored at home. NOTE: This sheet is a summary. It may not cover all possible information. If you have questions about this medicine, talk to your doctor, pharmacist, or health care provider.  2024 Elsevier/Gold Standard (2022-02-28 00:00:00)  

## 2022-11-14 DIAGNOSIS — M810 Age-related osteoporosis without current pathological fracture: Secondary | ICD-10-CM | POA: Diagnosis not present

## 2022-11-14 DIAGNOSIS — M7062 Trochanteric bursitis, left hip: Secondary | ICD-10-CM | POA: Diagnosis not present

## 2022-11-14 DIAGNOSIS — M150 Primary generalized (osteo)arthritis: Secondary | ICD-10-CM | POA: Diagnosis not present

## 2022-11-16 ENCOUNTER — Ambulatory Visit: Payer: Medicare Other

## 2022-11-16 VITALS — Ht 64.0 in | Wt 164.0 lb

## 2022-11-16 DIAGNOSIS — Z0000 Encounter for general adult medical examination without abnormal findings: Secondary | ICD-10-CM

## 2022-11-16 NOTE — Patient Instructions (Signed)
Sandy Salinas , Thank you for taking time to come for your Medicare Wellness Visit. I appreciate your ongoing commitment to your health goals. Please review the following plan we discussed and let me know if I can assist you in the future.   Referrals/Orders/Follow-Ups/Clinician Recommendations: none  This is a list of the screening recommended for you and due dates:  Health Maintenance  Topic Date Due   COVID-19 Vaccine (9 - 2023-24 season) 11/23/2022*   Flu Shot  05/07/2023*   Eye exam for diabetics  04/26/2023   Hemoglobin A1C  05/08/2023   Yearly kidney function blood test for diabetes  05/11/2023   Complete foot exam   05/11/2023   Cologuard (Stool DNA test)  07/08/2023   Mammogram  10/24/2023   Yearly kidney health urinalysis for diabetes  11/07/2023   Medicare Annual Wellness Visit  11/16/2023   DTaP/Tdap/Td vaccine (3 - Td or Tdap) 04/15/2026   Pneumonia Vaccine  Completed   DEXA scan (bone density measurement)  Completed   Hepatitis C Screening  Completed   Zoster (Shingles) Vaccine  Completed   HPV Vaccine  Aged Out  *Topic was postponed. The date shown is not the original due date.    Advanced directives: (Copy Requested) Please bring a copy of your health care power of attorney and living will to the office to be added to your chart at your convenience.  Next Medicare Annual Wellness Visit scheduled for next year: Yes 11/16/23 @ 1:40pm telephone

## 2022-11-16 NOTE — Progress Notes (Signed)
Subjective:   Sandy Salinas is a 72 y.o. female who presents for Medicare Annual (Subsequent) preventive examination.  Visit Complete: Virtual I connected with  Sandy Salinas on 11/16/22 by a audio enabled telemedicine application and verified that I am speaking with the correct person using two identifiers.  Patient Location: Home  Provider Location: Office/Clinic  I discussed the limitations of evaluation and management by telemedicine. The patient expressed understanding and agreed to proceed.  Vital Signs: Because this visit was a virtual/telehealth visit, some criteria may be missing or patient reported. Any vitals not documented were not able to be obtained and vitals that have been documented are patient reported.  Patient Medicare AWV questionnaire was completed by the patient on 11/13/22; I have confirmed that all information answered by patient is correct and no changes since this date.  Cardiac Risk Factors include: advanced age (>53men, >12 women);dyslipidemia;hypertension;diabetes mellitus    Objective:    Today's Vitals   11/16/22 1518  Weight: 164 lb (74.4 kg)  Height: 5\' 4"  (1.626 m)   Body mass index is 28.15 kg/m.     11/16/2022    3:25 PM 03/17/2021    9:39 PM 03/17/2021    6:11 PM 01/16/2021    3:22 PM 09/14/2020    9:41 AM 11/21/2019    2:03 PM 08/19/2019    3:13 PM  Advanced Directives  Does Patient Have a Medical Advance Directive? Yes No No No Yes No No  Type of Estate agent of [place];Living will    Healthcare Power of [place];Living will    Copy of Healthcare Power of Attorney in Chart? No - copy requested    No - copy requested    Would patient like information on creating a medical advance directive?  No - Patient declined  No - Patient declined  No - Patient declined Yes (MAU/Ambulatory/Procedural Areas - Information given)    Current Medications (verified) Outpatient Encounter Medications as of 11/16/2022   Medication Sig   Ascorbic Acid (VITAMIN C) 1000 MG tablet Take 1,000 mg by mouth daily.   atorvastatin (LIPITOR) 80 MG tablet TAKE 1 TABLET(80 MG) BY MOUTH AT BEDTIME   blood glucose meter kit and supplies KIT Dispense based on patient and insurance preference. Use to monitor blood sugars once daily as directed.   Calcium Carbonate-Vitamin D (CALTRATE 600+D PO) Take by mouth daily.   chlorthalidone (HYGROTON) 25 MG tablet TAKE 1 TABLET(25 MG) BY MOUTH DAILY   DULoxetine (CYMBALTA) 30 MG capsule Take 1 capsule (30 mg total) by mouth at bedtime.   lisinopril (ZESTRIL) 20 MG tablet Take 1 tablet (20 mg total) by mouth daily.   metFORMIN (GLUCOPHAGE) 1000 MG tablet Take 0.5 tablets (500 mg total) by mouth 2 (two) times daily with a meal.   methylPREDNISolone (MEDROL DOSEPAK) 4 MG TBPK tablet Day 1: Take 8 mg (2 tablets) before breakfast, 4 mg (1 tablet) after lunch, 4 mg (1 tablet) after supper, and 8 mg (2 tablets) at bedtime. Day 2:Take 4 mg (1 tablet) before breakfast, 4 mg (1 tablet) after lunch, 4 mg (1 tablet) after supper, and 8 mg (2 tablets) at bedtime. Day 3: Take 4 mg (1 tablet) before breakfast, 4 mg (1 tablet) after lunch, 4 mg (1 tablet) after supper, and 4 mg (1 tablet) at bedtime. Day 4: Take 4 mg (1 tablet) before breakfast, 4 mg (1 tablet) after lunch, and 4 mg (1 tablet) at bedtime. Day 5: Take 4 mg (1 tablet)  before breakfast and 4 mg (1 tablet) at bedtime. Day 6: Take 4 mg (1 tablet) before breakfast.   Multiple Vitamin (MULTIVITAMIN) tablet Take 1 tablet by mouth daily. Pt taking QHS   Semaglutide,0.25 or 0.5MG /DOS, (OZEMPIC, 0.25 OR 0.5 MG/DOSE,) 2 MG/3ML SOPN Inject 0.25 mg into the skin once a week.   vitamin B-12 (CYANOCOBALAMIN) 100 MCG tablet Take by mouth daily. Pt taking 1000 mcg daily QHS   No facility-administered encounter medications on file as of 11/16/2022.    Allergies (verified) Codeine and Jardiance [empagliflozin]   History: Past Medical History:  Diagnosis  Date   Abdominal aortic ectasia (HCC) 05/19/2016   [place] April 2018, rescan in five years (April 2023)   Breast calcification seen on mammogram 12/01/2016   LEFT   Cataract    Depression 03/18/2021   Diabetes mellitus without complication (HCC)    Family history of abdominal aortic aneurysm (AAA) 05/12/2016   father   Hyperlipidemia    Hypertension    Lumbar radiculopathy, acute    Microalbuminuria due to type 2 diabetes mellitus (HCC)    Obesity    Osteoporosis    Primary osteoarthritis of left knee    Psoriasis, unspecified    Pyelonephritis 03/17/2021   Seizures (HCC)    Past Surgical History:  Procedure Laterality Date   ABDOMINAL HYSTERECTOMY  1975   total   BREAST BIOPSY Right 04/09/2012   x 2, FIBROADENOMA WITH COARSE CALCIFICATIONS.done by byrnett   BREAST BIOPSY Left 09/04/2017   Affirm Bx- X clip, FIBROADENOMATOUS CHANGE WITH CALCIFICATIONS. NEGATIVE FOR ATYPIA AND MALIGNANCY   CYSTECTOMY  2003   ROUX-EN-Y GASTRIC BYPASS  2009   TONSILLECTOMY  1970   TUBAL LIGATION  1975   Family History  Problem Relation Age of Onset   Alzheimer's disease Mother    Heart murmur Mother    Dementia Mother    Arthritis Mother    AAA (abdominal aortic aneurysm) Father    Heart disease Father    Asthma Sister    Hypertension Sister    Cancer Sister        breast   Breast cancer Sister        late 107's   Heart disease Brother    Hypertension Brother    Aneurysm Paternal Aunt    Lung cancer Paternal Uncle    Cancer Paternal Grandmother        possible, not sure what kind   Cancer Cousin    Social History   Socioeconomic History   Marital status: Married    Spouse name: Merchant navy officer   Number of children: 2   Years of education: Not on file   Highest education level: Associate degree: occupational, Scientist, product/process development, or vocational program  Occupational History   Occupation: retired  Tobacco Use   Smoking status: Never   Smokeless tobacco: Never  Vaping Use   Vaping status: Never Used   Substance and Sexual Activity   Alcohol use: No   Drug use: No   Sexual activity: Not Currently    Birth control/protection: Abstinence    Comment: Old Age  Other Topics Concern   Not on file  Social History Narrative   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    Social Determinants of Health   Financial Resource Strain: Low Risk  (11/13/2022)   Overall Financial Resource Strain (CARDIA)    Difficulty of Paying Living Expenses: Not hard at all  Food Insecurity: No Food Insecurity (11/13/2022)   Hunger Vital Sign  Worried About Programme researcher, broadcasting/film/video in the Last Year: Never true    Ran Out of Food in the Last Year: Never true  Transportation Needs: No Transportation Needs (11/13/2022)   PRAPARE - Administrator, Civil Service (Medical): No    Lack of Transportation (Non-Medical): No  Physical Activity: Inactive (11/13/2022)   Exercise Vital Sign    Days of Exercise per Week: 0 days    Minutes of Exercise per Session: 0 min  Stress: No Stress Concern Present (11/13/2022)   [employer] of Occupational Health - Occupational Stress Questionnaire    Feeling of Stress : Not at all  Social Connections: Moderately Integrated (11/13/2022)   Social Connection and Isolation Panel [NHANES]    Frequency of Communication with Friends and Family: More than three times a week    Frequency of Social Gatherings with Friends and Family: More than three times a week    Attends Religious Services: More than 4 times per year    Active Member of [employer] or Organizations: No    Attends Engineer, structural: Never    Marital Status: Married    Tobacco Counseling Counseling given: Not Answered   Clinical Intake:  Pre-visit preparation completed: Yes  Pain : No/denies pain   BMI - recorded: 28.15 Nutritional Status: BMI 25 -29 Overweight Nutritional Risks: None Diabetes: Yes CBG done?: No Did pt. bring in CBG monitor from home?: No  How often do you need to  have someone help you when you read instructions, pamphlets, or other written materials from your doctor or pharmacy?: 1 - Never  Interpreter Needed?: No  Comments: lives with husband Information entered by :: B.Thao Vanover,LPN   Activities of Daily Living    11/13/2022   10:04 AM 11/13/2022    8:10 AM  In your present state of health, do you have any difficulty performing the following activities:  Hearing? 0 0  Vision? 0 0  Difficulty concentrating or making decisions? 0 0  Walking or climbing stairs? 0 0  Dressing or bathing? 0 0  Doing errands, shopping? 0 0  Preparing Food and eating ? N   Using the Toilet? N   In the past six months, have you accidently leaked urine? Y   Do you have problems with loss of bowel control? N   Managing your Medications? N   Managing your Finances? N   Housekeeping or managing your Housekeeping? N     Patient Care Team: Margarita Mail, DO as PCP - General (Internal Medicine) Alva Garnet Tyrone Apple, MD (Bariatrics) Sherle Poe, Vashti Hey, MD (Dermatology) Tedd Sias Marlana Salvage, MD as Physician Assistant (Endocrinology) Patterson Hammersmith, MD as Consulting Physician (Rheumatology) Lonell Face, MD as Consulting Physician (Neurology) Alwyn Pea, MD as Consulting Physician (Cardiology) Ronney Asters Jackelyn Poling, RPH-CPP as Pharmacist Isla Pence, OD (Optometry)  Indicate any recent Medical Services you may have received from other than [facility] providers in the past year (date may be approximate).     Assessment:   This is a routine wellness examination for [place].  Hearing/Vision screen Hearing Screening - Comments:: Pt says her hearing is good Vision Screening - Comments:: Pt says vision good with glasses Dr Clydene Pugh   Goals Addressed             This Visit's Progress    Monitor and Manage My Blood Sugar-Diabetes Type 2   Not on track    Timeframe:  Long-Range Goal Priority:  High Start Date: 07/05/21  Expected End  Date: 07/06/22                      Follow Up within 90 days   - check blood sugar at prescribed times - check blood sugar if I feel it is too high or too low - enter blood sugar readings and medication or insulin into daily log - take the blood sugar log to all doctor visits    Why is this important?   Checking your blood sugar at home helps to keep it from getting very high or very low.  Writing the results in a diary or log helps the doctor know how to care for you.  Your blood sugar log should have the time, date and the results.  Also, write down the amount of insulin or other medicine that you take.  Other information, like what you ate, exercise done and how you were feeling, will also be helpful.     Notes:      Pharmacy Goals   On track    The goal A1c is less than 7%. This is the best way to reduce the risk of the long term complications of diabetes, including heart disease, kidney disease, eye disease, strokes, and nerve damage. An A1c of less than 7% corresponds with fasting sugars less than 130 and 2 hour after meal sugars less than 180.  Please watch the mail for an envelope from [employer] containing the patient assistance program application. Please complete this application and mail back to [facility] Technician Noreene Larsson Simcox along with a copy of your Medicare Part D prescription card and a copy of your proof of income document OR you can bring these documents to the office to have them faxed back to Attention: Pattricia Boss at Fax # [phone]   If you need to call Noreene Larsson, you can reach her at [phone]   In the future, if you need to reach out to the patient assistance program regarding refills or to find out the status of your application, you can do so by calling:   [employer] at [phone]     Thank you!   Estelle Grumbles, PharmD, [facility] [phone]     COMPLETED: Weight (lb) < 170 lb (77.1 kg)   164 lb (74.4 kg)       Depression Screen    11/16/2022    3:23 PM 11/13/2022    8:10 AM 06/06/2022   10:29 AM 06/01/2022    1:29 PM 05/11/2022    7:53 AM 11/08/2021    1:16 PM 09/15/2021    9:42 AM  PHQ 2/9 Scores  PHQ - 2 Score 0 0 0 0 0 0 0  PHQ- 9 Score  0 0 0 0 0 0    Fall Risk    11/13/2022   10:04 AM 11/13/2022    8:09 AM 06/06/2022   10:29 AM 06/01/2022    1:29 PM 05/11/2022    7:53 AM  Fall Risk   Falls in the past year? 1 0 0 0 0  Number falls in past yr: 0 0 0 0 0  Injury with Fall? 0 0 0 0 0  Risk for fall due to : No Fall Risks   No Fall Risks No Fall Risks  Follow up Education provided;Falls prevention discussed   Falls evaluation completed Falls prevention discussed;Education provided;Falls evaluation completed    MEDICARE RISK AT HOME: Medicare Risk at Home Any stairs  in or around the home?: No If so, are there any without handrails?: No Home free of loose throw rugs in walkways, pet beds, electrical cords, etc?: No Adequate lighting in your home to reduce risk of falls?: Yes Life alert?: No Use of a cane, walker or w/c?: No Grab bars in the bathroom?: Yes Shower chair or bench in shower?: No Elevated toilet seat or a handicapped toilet?: No  TIMED UP AND GO:  Was the test performed?  No    Cognitive Function:        11/16/2022    3:30 PM 09/15/2021    9:28 AM 08/19/2019    3:16 PM 05/15/2017    8:39 AM  6CIT Screen  What Year? 0 points 0 points 0 points 0 points  What month? 0 points 0 points 0 points 0 points  What time? 0 points 0 points 0 points 0 points  Count back from 20 0 points 0 points 0 points 0 points  Months in reverse 0 points 0 points 0 points 0 points  Repeat phrase 0 points 0 points 0 points 0 points  Total Score 0 points 0 points 0 points 0 points    Immunizations Immunization History  Administered Date(s) Administered   Influenza, High Dose Seasonal PF 11/14/2016, 09/24/2018   Influenza, Seasonal, Injecte, Preservative Fre 01/10/2007    Influenza,inj,Quad PF,6+ Mos 10/14/2015   Influenza-Unspecified 09/26/2019, 10/21/2020, 10/13/2021   Moderna Sars-Covid-2 Vaccination 10/26/2021   PFIZER Comirnaty(Gray Top)Covid-19 Tri-Sucrose Vaccine 02/27/2019, 03/20/2019   PFIZER(Purple Top)SARS-COV-2 Vaccination 02/27/2019, 03/20/2019, 11/03/2019, 05/04/2020, 10/21/2020   PNEUMOCOCCAL CONJUGATE-20 10/13/2021   Pneumococcal Conjugate-13 11/14/2016   Pneumococcal Polysaccharide-23 11/20/2017   Respiratory Syncytial Virus Vaccine,Recomb Aduvanted(Arexvy) 10/13/2021   Td 02/07/2004   Tdap 04/14/2016   Zoster Recombinant(Shingrix) 10/26/2021, 01/17/2022    TDAP status: Up to date  Flu Vaccine status: Up to date  Pneumococcal vaccine status: Up to date  Covid-19 vaccine status: Completed vaccines  Qualifies for Shingles Vaccine? Yes   Zostavax completed Yes   Shingrix Completed?: Yes  Screening Tests Health Maintenance  Topic Date Due   COVID-19 Vaccine (9 - 2023-24 season) 11/23/2022 (Originally 10/08/2022)   INFLUENZA VACCINE  05/07/2023 (Originally 09/07/2022)   OPHTHALMOLOGY EXAM  04/26/2023   HEMOGLOBIN A1C  05/08/2023   Diabetic kidney evaluation - eGFR measurement  05/11/2023   FOOT EXAM  05/11/2023   Fecal DNA (Cologuard)  07/08/2023   MAMMOGRAM  10/24/2023   Diabetic kidney evaluation - Urine ACR  11/07/2023   Medicare Annual Wellness (AWV)  11/16/2023   DTaP/Tdap/Td (3 - Td or Tdap) 04/15/2026   Pneumonia Vaccine 40+ Years old  Completed   DEXA SCAN  Completed   Hepatitis C Screening  Completed   Zoster Vaccines- Shingrix  Completed   HPV VACCINES  Aged Out    Health Maintenance  There are no preventive care reminders to display for this patient.   Colorectal cancer screening: Type of screening: Cologuard. Completed 07/07/20. Repeat every 3 years  Mammogram status: Completed 10/24/22. Repeat every year  Bone Density status: Completed 10/24/22. Results reflect: Bone density results: OSTEOPENIA. Repeat every 3  years.  Lung Cancer Screening: (Low Dose CT Chest recommended if Age 69-80 years, 20 pack-year currently smoking OR have quit w/in 15years.) does not qualify.   Lung Cancer Screening Referral: no  Additional Screening:  Hepatitis C Screening: does not qualify; Completed 08/27/2018  Vision Screening: Recommended annual ophthalmology exams for early detection of glaucoma and other disorders of the eye. Is the  patient up to date with their annual eye exam?  Yes  Who is the provider or what is the name of the office in which the patient attends annual eye exams? Dr Clydene Pugh If pt is not established with a provider, would they like to be referred to a provider to establish care? No .   Dental Screening: Recommended annual dental exams for proper oral hygiene  Diabetic Foot Exam: Diabetic Foot Exam: Completed 05/11/22  Community Resource Referral / Chronic Care Management: CRR required this visit?  No   CCM required this visit?  No    Plan:     I have personally reviewed and noted the following in the patient's chart:   Medical and social history Use of alcohol, tobacco or illicit drugs  Current medications and supplements including opioid prescriptions. Patient is not currently taking opioid prescriptions. Functional ability and status Nutritional status Physical activity Advanced directives List of other physicians Hospitalizations, surgeries, and ER visits in previous 12 months Vitals Screenings to include cognitive, depression, and falls Referrals and appointments  In addition, I have reviewed and discussed with patient certain preventive protocols, quality metrics, and best practice recommendations. A written personalized care plan for preventive services as well as general preventive health recommendations were provided to patient.    Sue Lush, LPN   [id number]   After Visit Summary: (MyChart) Due to this being a telephonic visit, the after visit summary with  patients personalized plan was offered to patient via MyChart   Nurse Notes: The patient states she is doing well and has no concerns or questions at this time.

## 2022-12-05 DIAGNOSIS — M810 Age-related osteoporosis without current pathological fracture: Secondary | ICD-10-CM | POA: Diagnosis not present

## 2022-12-12 DIAGNOSIS — D485 Neoplasm of uncertain behavior of skin: Secondary | ICD-10-CM | POA: Diagnosis not present

## 2022-12-12 DIAGNOSIS — B078 Other viral warts: Secondary | ICD-10-CM | POA: Diagnosis not present

## 2022-12-12 DIAGNOSIS — D2230 Melanocytic nevi of unspecified part of face: Secondary | ICD-10-CM | POA: Diagnosis not present

## 2022-12-12 DIAGNOSIS — L570 Actinic keratosis: Secondary | ICD-10-CM | POA: Diagnosis not present

## 2022-12-12 DIAGNOSIS — L578 Other skin changes due to chronic exposure to nonionizing radiation: Secondary | ICD-10-CM | POA: Diagnosis not present

## 2022-12-13 ENCOUNTER — Other Ambulatory Visit: Payer: Medicare Other | Admitting: Pharmacist

## 2022-12-13 NOTE — Patient Instructions (Signed)
Goals Addressed             This Visit's Progress    Pharmacy Goals       The goal A1c is less than 7%. This is the best way to reduce the risk of the long term complications of diabetes, including heart disease, kidney disease, eye disease, strokes, and nerve damage. An A1c of less than 7% corresponds with fasting sugars less than 130 and 2 hour after meal sugars less than 180.  Please the patient assistance program application and mail back to [facility] Technician Noreene Larsson Simcox OR you can bring these documents to the office to have them faxed back to Attention: Pattricia Boss at Fax # [phone]   If you need to call Noreene Larsson, you can reach her at [phone]   In the future, if you need to reach out to the patient assistance program regarding refills or to find out the status of your application, you can do so by calling:   [employer] at [phone]   Thank you!   Estelle Grumbles, PharmD, [facility] [phone]

## 2022-12-13 NOTE — Progress Notes (Signed)
   12/13/2022  Patient ID: Sandy Salinas, female   DOB: 02-11-1950, 72 y.o.   MRN: [id number]  Outreach to patient today to follow up regarding enrollment for Ozempic patient assistance from [employer]. Note [facility] mailed application to patient on 11/10/2022 - Today patient confirms that she received this paperwork and is planning to complete and mail this back to [facility] - Let patient know that as [employer] is no longer accepting applications for current calendar year, application will be submitted for 2025 calendar year  Follow Up Plan: Will follow up with patient by telephone in January  Estelle Grumbles, PharmD, [facility] [phone]

## 2022-12-25 ENCOUNTER — Encounter: Payer: Self-pay | Admitting: Physician Assistant

## 2022-12-25 ENCOUNTER — Ambulatory Visit (INDEPENDENT_AMBULATORY_CARE_PROVIDER_SITE_OTHER): Payer: Medicare Other | Admitting: Physician Assistant

## 2022-12-25 VITALS — BP 124/64 | HR 89 | Resp 16 | Ht 64.0 in | Wt 167.4 lb

## 2022-12-25 DIAGNOSIS — R319 Hematuria, unspecified: Secondary | ICD-10-CM | POA: Diagnosis not present

## 2022-12-25 DIAGNOSIS — N390 Urinary tract infection, site not specified: Secondary | ICD-10-CM | POA: Diagnosis not present

## 2022-12-25 DIAGNOSIS — R350 Frequency of micturition: Secondary | ICD-10-CM | POA: Diagnosis not present

## 2022-12-25 LAB — POCT URINALYSIS DIPSTICK
Bilirubin, UA: NEGATIVE
Glucose, UA: NEGATIVE
Ketones, UA: NEGATIVE
Nitrite, UA: NEGATIVE
Protein, UA: NEGATIVE
Spec Grav, UA: 1.02 (ref 1.010–1.025)
Urobilinogen, UA: 0.2 U/dL
pH, UA: 5 (ref 5.0–8.0)

## 2022-12-25 MED ORDER — SULFAMETHOXAZOLE-TRIMETHOPRIM 800-160 MG PO TABS
1.0000 | ORAL_TABLET | Freq: Two times a day (BID) | ORAL | 0 refills | Status: AC
Start: 2022-12-25 — End: 2022-12-30

## 2022-12-25 NOTE — Progress Notes (Signed)
Acute Office Visit   Patient: Sandy Salinas   DOB: 1950-02-08   72 y.o. Female  MRN: [id number] Visit Date: 12/25/2022  Today's healthcare provider: Oswaldo Conroy Gwendalyn Mcgonagle, PA-C  Introduced myself to the patient as a Secondary school teacher and provided education on APPs in clinical practice.    Chief Complaint  Patient presents with   Urinary Frequency   vaginal pressure    Onset for a week pt did a course of Azo last week but no relief   Subjective    HPI HPI     vaginal pressure    Additional comments: Onset for a week pt did a course of Azo last week but no relief      Last edited by Forde Radon, CMA on 12/25/2022  9:26 AM.      Concern for UTI  Onset: gradual  Duration: ongoing for about a week  Associated symptoms:increased urinary frequency, pelvic pressure,  Interventions; tried AZO last week without relief   She reports she has not had UTI in a long time   Medications: Outpatient Medications Prior to Visit  Medication Sig   Ascorbic Acid (VITAMIN C) 1000 MG tablet Take 1,000 mg by mouth daily.   atorvastatin (LIPITOR) 80 MG tablet TAKE 1 TABLET(80 MG) BY MOUTH AT BEDTIME   blood glucose meter kit and supplies KIT Dispense based on patient and insurance preference. Use to monitor blood sugars once daily as directed.   Calcium Carbonate-Vitamin D (CALTRATE 600+D PO) Take by mouth daily.   chlorthalidone (HYGROTON) 25 MG tablet TAKE 1 TABLET(25 MG) BY MOUTH DAILY   DULoxetine (CYMBALTA) 30 MG capsule Take 1 capsule (30 mg total) by mouth at bedtime.   lisinopril (ZESTRIL) 20 MG tablet Take 1 tablet (20 mg total) by mouth daily.   metFORMIN (GLUCOPHAGE) 1000 MG tablet Take 0.5 tablets (500 mg total) by mouth 2 (two) times daily with a meal.   methylPREDNISolone (MEDROL DOSEPAK) 4 MG TBPK tablet Day 1: Take 8 mg (2 tablets) before breakfast, 4 mg (1 tablet) after lunch, 4 mg (1 tablet) after supper, and 8 mg (2 tablets) at bedtime. Day 2:Take 4 mg (1 tablet)  before breakfast, 4 mg (1 tablet) after lunch, 4 mg (1 tablet) after supper, and 8 mg (2 tablets) at bedtime. Day 3: Take 4 mg (1 tablet) before breakfast, 4 mg (1 tablet) after lunch, 4 mg (1 tablet) after supper, and 4 mg (1 tablet) at bedtime. Day 4: Take 4 mg (1 tablet) before breakfast, 4 mg (1 tablet) after lunch, and 4 mg (1 tablet) at bedtime. Day 5: Take 4 mg (1 tablet) before breakfast and 4 mg (1 tablet) at bedtime. Day 6: Take 4 mg (1 tablet) before breakfast.   Multiple Vitamin (MULTIVITAMIN) tablet Take 1 tablet by mouth daily. Pt taking QHS   Semaglutide,0.25 or 0.5MG /DOS, (OZEMPIC, 0.25 OR 0.5 MG/DOSE,) 2 MG/3ML SOPN Inject 0.25 mg into the skin once a week.   vitamin B-12 (CYANOCOBALAMIN) 100 MCG tablet Take by mouth daily. Pt taking 1000 mcg daily QHS   No facility-administered medications prior to visit.    Review of Systems  Constitutional:  Negative for chills, fatigue and fever.  Gastrointestinal:  Negative for abdominal distention and abdominal pain.  Genitourinary:  Positive for frequency and urgency. Negative for difficulty urinating, dysuria, flank pain, hematuria, pelvic pain, vaginal bleeding, vaginal discharge and vaginal pain.       Vaginal pressure    Neurological:  Negative for dizziness and headaches.        Objective    BP 124/64   Pulse 89   Resp 16   Ht 5\' 4"  (1.626 m)   Wt 167 lb 6.4 oz (75.9 kg)   SpO2 97%   BMI 28.73 kg/m     Physical Exam Vitals reviewed.  Constitutional:      General: She is awake.     Appearance: Normal appearance. She is well-developed and well-groomed.  HENT:     Head: Normocephalic and atraumatic.  Eyes:     General: Lids are normal. Gaze aligned appropriately.     Extraocular Movements: Extraocular movements intact.     Conjunctiva/sclera: Conjunctivae normal.  Pulmonary:     Effort: Pulmonary effort is normal.  Neurological:     General: No focal deficit present.     Mental Status: She is alert and oriented  to person, place, and time.     GCS: GCS eye subscore is 4. GCS verbal subscore is 5. GCS motor subscore is 6.     Cranial Nerves: No cranial nerve deficit, dysarthria or facial asymmetry.  Psychiatric:        Attention and Perception: Attention and perception normal.        Mood and Affect: Mood and affect normal.        Speech: Speech normal.        Behavior: Behavior normal. Behavior is cooperative.       Results for orders placed or performed in visit on 12/25/22  POCT urinalysis dipstick  Result Value Ref Range   Color, UA Yellow    Clarity, UA Cloudy    Glucose, UA Negative Negative   Bilirubin, UA Negative    Ketones, UA Negative    Spec Grav, UA 1.020 1.010 - 1.025   Blood, UA Small    pH, UA 5.0 5.0 - 8.0   Protein, UA Negative Negative   Urobilinogen, UA 0.2 0.2 or 1.0 E.U./dL   Nitrite, UA Negative    Leukocytes, UA Large (3+) (A) Negative   Appearance yellow    Odor foul     Assessment & Plan      No follow-ups on file.       Problem List Items Addressed This Visit       Other   Urinary frequency   Relevant Orders   POCT urinalysis dipstick (Completed)   Urine Culture   Other Visit Diagnoses     Urinary tract infection with hematuria, site unspecified    -  Primary   Relevant Medications   sulfamethoxazole-trimethoprim (BACTRIM DS) 800-160 MG tablet      Acute, new problem Patient reports symptoms comprised of the following: pressure, increased urinary frequency, urinary urgency since last week Results of UA are consistent with UTI - urine sample sent for culture to determine causative organism and susceptibility- results to dictate further management  Results of urine dip reviewed with patient during apt  Will provide script for Bactrim BID x 5 days  - discussed importance of finishing entire course of abx and staying well hydrated while recovering from UTI She denies previous hx of yeast infections with abx use- no diflucan indicated for  prevention  Reviewed ED precautions with patient Follow up as needed for persistent or worsening symptoms   No follow-ups on file.   I, Evania Lyne E Cerita Rabelo, PA-C, have reviewed all documentation for this visit. The documentation on 12/25/22 for the exam, diagnosis, procedures, and orders are all  accurate and complete.   Jacquelin Hawking, MHS, PA-C [facility]

## 2022-12-27 ENCOUNTER — Other Ambulatory Visit: Payer: Self-pay | Admitting: Pharmacy Technician

## 2022-12-27 DIAGNOSIS — Z5986 Financial insecurity: Secondary | ICD-10-CM

## 2022-12-27 NOTE — Progress Notes (Signed)
Pharmacy Medication Assistance Program Note    12/27/2022  Patient ID: Sandy Salinas, female   DOB: 1951-01-05, 72 y.o.   MRN: [id number]     12/27/2022  Outreach Medication One  Initial Outreach Date (Medication One) 11/10/2022  Manufacturer Medication One [employer] Drugs Ozempic  Dose of Ozempic 2mg /73ml  Type of Radiographer, therapeutic Assistance  Date Application Sent to Patient 11/10/2022  Application Items Requested Application;Proof of Income;Other  Date Application Sent to Prescriber 11/16/2022  Name of Prescriber Margarita Mail  Date Application Received From Patient 12/20/2022  Date Application Received From Provider 11/20/2022  Date Application Submitted to Manufacturer 12/27/2022  Method Application Sent to Manufacturer Fax       Signature  Kristopher Glee [facility]  Office: [phone] Fax: [phone] Email: [email or web address]

## 2022-12-28 ENCOUNTER — Encounter: Payer: Self-pay | Admitting: Pharmacy Technician

## 2022-12-28 LAB — URINE CULTURE
MICRO NUMBER:: 15744722
SPECIMEN QUALITY:: ADEQUATE

## 2022-12-28 NOTE — Progress Notes (Signed)
Your urine culture results are back and it was positive for E.coli bacteria  The bacteria was found to be susceptible to the antibiotic you were started on so please make sure you finish the entire course as directed Please stay well hydrated. Let us know if you have further questions or concerns

## 2022-12-28 NOTE — Progress Notes (Signed)
Erroneous encounter.  Pattricia Boss, CPhT [place]  Office: [phone] Fax: [phone] Email: [email or web address]

## 2023-01-18 ENCOUNTER — Telehealth: Payer: Self-pay

## 2023-01-18 NOTE — Telephone Encounter (Signed)
Copied from CRM [id number]. Topic: Referral - Question >> Jan 18, 2023  2:27 PM Haroldine Laws wrote: Reason for CRM: Pt went to see Dr. Allena Katz at [facility]  She got a letter today saying she was approved for endocrinology at [facility].  CB@  [phone]

## 2023-02-04 ENCOUNTER — Other Ambulatory Visit: Payer: Self-pay | Admitting: Internal Medicine

## 2023-02-04 DIAGNOSIS — I10 Essential (primary) hypertension: Secondary | ICD-10-CM

## 2023-02-04 DIAGNOSIS — R519 Headache, unspecified: Secondary | ICD-10-CM

## 2023-02-08 NOTE — Telephone Encounter (Signed)
 Requested Prescriptions  Pending Prescriptions Disp Refills   lisinopril  (ZESTRIL ) 20 MG tablet [Pharmacy Med Name: LISINOPRIL  20MG  TABLETS] 90 tablet 0    Sig: TAKE 1 TABLET(20 MG) BY MOUTH DAILY     Cardiovascular:  ACE Inhibitors Failed - 02/08/2023  3:12 PM      Failed - Cr in normal range and within 180 days    Creat  Date Value Ref Range Status  05/11/2022 0.88 0.60 - 1.00 mg/dL Final   Creatinine, Urine  Date Value Ref Range Status  11/07/2022 27 20 - 275 mg/dL Final         Failed - K in normal range and within 180 days    Potassium  Date Value Ref Range Status  05/11/2022 4.6 3.5 - 5.3 mmol/L Final         Passed - Patient is not pregnant      Passed - Last BP in normal range    BP Readings from Last 1 Encounters:  12/25/22 124/64         Passed - Valid encounter within last 6 months    Recent Outpatient Visits           1 month ago Urinary tract infection with hematuria, site unspecified   [facility] Mecum, Rocky BRAVO, PA-C   2 months ago Trochanteric bursitis of left hip   [facility] Bernardo Fend, DO   3 months ago Primary hypertension   [place] [facility] Bernardo Fend, DO   8 months ago Dermatitis due to plants, including poison ivy, sumac, and oak   [place] [facility] Mecum, Rocky BRAVO, PA-C   8 months ago Dermatitis due to plants, including poison ivy, sumac, and oak   [place] [facility] Mecum, Erin E, PA-C

## 2023-02-11 ENCOUNTER — Other Ambulatory Visit: Payer: Self-pay | Admitting: Internal Medicine

## 2023-02-11 DIAGNOSIS — I10 Essential (primary) hypertension: Secondary | ICD-10-CM

## 2023-02-11 DIAGNOSIS — E11649 Type 2 diabetes mellitus with hypoglycemia without coma: Secondary | ICD-10-CM

## 2023-02-13 ENCOUNTER — Other Ambulatory Visit: Payer: Self-pay | Admitting: Internal Medicine

## 2023-02-13 DIAGNOSIS — R519 Headache, unspecified: Secondary | ICD-10-CM

## 2023-02-13 DIAGNOSIS — I10 Essential (primary) hypertension: Secondary | ICD-10-CM

## 2023-02-13 NOTE — Telephone Encounter (Signed)
 Requested Prescriptions  Pending Prescriptions Disp Refills   chlorthalidone  (HYGROTON ) 25 MG tablet [Pharmacy Med Name: CHLORTHALIDONE  25MG  TABLETS] 90 tablet 0    Sig: TAKE 1 TABLET(25 MG) BY MOUTH DAILY     Cardiovascular: Diuretics - Thiazide Failed - 02/13/2023  4:53 PM      Failed - Cr in normal range and within 180 days    Creat  Date Value Ref Range Status  05/11/2022 0.88 0.60 - 1.00 mg/dL Final   Creatinine, Urine  Date Value Ref Range Status  11/07/2022 27 20 - 275 mg/dL Final         Failed - K in normal range and within 180 days    Potassium  Date Value Ref Range Status  05/11/2022 4.6 3.5 - 5.3 mmol/L Final         Failed - Na in normal range and within 180 days    Sodium  Date Value Ref Range Status  05/11/2022 140 135 - 146 mmol/L Final  02/18/2015 141 134 - 144 mmol/L Final         Passed - Last BP in normal range    BP Readings from Last 1 Encounters:  12/25/22 124/64         Passed - Valid encounter within last 6 months    Recent Outpatient Visits           1 month ago Urinary tract infection with hematuria, site unspecified   [place] [facility] Mecum, Rocky BRAVO, PA-C   3 months ago Trochanteric bursitis of left hip   [facility] Bernardo Fend, DO   3 months ago Primary hypertension   [place] [facility] Bernardo Fend, DO   8 months ago Dermatitis due to plants, including poison ivy, sumac, and oak   [place] [facility] Mecum, Erin E, PA-C   8 months ago Dermatitis due to plants, including poison ivy, sumac, and oak   [place] [facility] Mecum, Erin E, PA-C              Refused Prescriptions Disp Refills   lisinopril  (ZESTRIL ) 10 MG tablet [Pharmacy Med Name: LISINOPRIL  10MG  TABLETS] 90 tablet 1    Sig: TAKE 1 TABLET(10 MG) BY MOUTH DAILY     Cardiovascular:  ACE Inhibitors Failed - 02/13/2023  4:53 PM      Failed - Cr in normal  range and within 180 days    Creat  Date Value Ref Range Status  05/11/2022 0.88 0.60 - 1.00 mg/dL Final   Creatinine, Urine  Date Value Ref Range Status  11/07/2022 27 20 - 275 mg/dL Final         Failed - K in normal range and within 180 days    Potassium  Date Value Ref Range Status  05/11/2022 4.6 3.5 - 5.3 mmol/L Final         Passed - Patient is not pregnant      Passed - Last BP in normal range    BP Readings from Last 1 Encounters:  12/25/22 124/64         Passed - Valid encounter within last 6 months    Recent Outpatient Visits           1 month ago Urinary tract infection with hematuria, site unspecified   [facility] Mecum, Erin E, PA-C   3 months ago Trochanteric bursitis of left hip   [place] [facility] Bernardo Fend, DO   3 months ago Primary hypertension   [place] [facility] Bernardo Fend, DO   8 months ago Dermatitis due to plants, including poison ivy, sumac, and oak   [place] [facility] Mecum, Rocky BRAVO, PA-C   8 months ago Dermatitis due to plants, including poison ivy, sumac, and oak   [place] [facility] Mecum, Rocky BRAVO, PA-C

## 2023-02-14 ENCOUNTER — Other Ambulatory Visit: Payer: Self-pay | Admitting: Internal Medicine

## 2023-02-14 ENCOUNTER — Telehealth: Payer: Self-pay | Admitting: Internal Medicine

## 2023-02-14 DIAGNOSIS — R519 Headache, unspecified: Secondary | ICD-10-CM

## 2023-02-14 DIAGNOSIS — I10 Essential (primary) hypertension: Secondary | ICD-10-CM

## 2023-02-14 NOTE — Telephone Encounter (Signed)
 Called [facility] and was advised that both the 10 mg and 20 mg were on pt's profile. Advised the 20 mg was dc'd 11/07/22 and the 20 mg was started that same day. Advised the 20 mg is her dose. Was going to talk to pharmacist but the hold was too long.   Called back to have the 10 mg dose Lisinopril  removed from profile so her 20 mg dosage can be filled. [employer] Pharmacist who removed it from profile.  Sandy Salinas stated she has it ready for pt to pick up.   Called pt and LM on VM of the same.   Refused this 5th request from pharmacy.

## 2023-02-14 NOTE — Telephone Encounter (Signed)
 Called [facility] to check if they received refill for lisinopril. Pharmacy tech reports they received it but pharmacist refused it. Message left informing pt. Of this.

## 2023-02-14 NOTE — Telephone Encounter (Signed)
 Medication Refill -  Most Recent Primary Care Visit:  Provider: MARYLENE LONGS E  Department: [facility]  Visit Type: OFFICE VISIT  Date: 12/25/2022  Medication: lisinopril  (ZESTRIL ) 20 MG tablet   Pt called office earlier today and was informed that prescription was sent in on 02/08/2023. Pt reached out to [facility] and they have not received prescription. Pt originally requested refill on 02/04/2023. Pt requesting refill to be sent in again.  Has the patient contacted their pharmacy? Yes  Is this the correct pharmacy for this prescription? Yes  This is the patient's preferred pharmacy:  [id number] - [place], [place] - [address] AT [facility] OF [address] [place] [address] Phone: [phone] Fax: [phone]  Has the prescription been filled recently? Yes  Is the patient out of the medication? Yes  Has the patient been seen for an appointment in the last year OR does the patient have an upcoming appointment? Yes  Can we respond through MyChart? Yes  Agent: Please be advised that Rx refills may take up to 3 business days. We ask that you follow-up with your pharmacy.

## 2023-02-14 NOTE — Telephone Encounter (Signed)
 Refilled 02/08/23 # 90. Requested Prescriptions  Refused Prescriptions Disp Refills   lisinopril  (ZESTRIL ) 20 MG tablet [Pharmacy Med Name: LISINOPRIL  20MG  TABLETS] 90 tablet 0    Sig: TAKE 1 TABLET(20 MG) BY MOUTH DAILY     Cardiovascular:  ACE Inhibitors Failed - 02/14/2023 10:33 AM      Failed - Cr in normal range and within 180 days    Creat  Date Value Ref Range Status  05/11/2022 0.88 0.60 - 1.00 mg/dL Final   Creatinine, Urine  Date Value Ref Range Status  11/07/2022 27 20 - 275 mg/dL Final         Failed - K in normal range and within 180 days    Potassium  Date Value Ref Range Status  05/11/2022 4.6 3.5 - 5.3 mmol/L Final         Passed - Patient is not pregnant      Passed - Last BP in normal range    BP Readings from Last 1 Encounters:  12/25/22 124/64         Passed - Valid encounter within last 6 months    Recent Outpatient Visits           1 month ago Urinary tract infection with hematuria, site unspecified   [facility] Mecum, Erin E, PA-C   3 months ago Trochanteric bursitis of left hip   [facility] Bernardo Fend, DO   3 months ago Primary hypertension   [place] [facility] Bernardo Fend, DO   8 months ago Dermatitis due to plants, including poison ivy, sumac, and oak   [place] [facility] Mecum, Rocky BRAVO, PA-C   8 months ago Dermatitis due to plants, including poison ivy, sumac, and oak    [place] Mecum, Erin E, PA-C

## 2023-02-21 ENCOUNTER — Telehealth: Payer: Self-pay | Admitting: Pharmacy Technician

## 2023-02-21 ENCOUNTER — Other Ambulatory Visit: Payer: Self-pay | Admitting: Pharmacist

## 2023-02-21 DIAGNOSIS — Z5986 Financial insecurity: Secondary | ICD-10-CM

## 2023-02-21 NOTE — Patient Instructions (Signed)
 Goals Addressed             This Visit's Progress    Pharmacy Goals       The goal A1c is less than 7%. This is the best way to reduce the risk of the long term complications of diabetes, including heart disease, kidney disease, eye disease, strokes, and nerve damage. An A1c of less than 7% corresponds with fasting sugars less than 130 and 2 hour after meal sugars less than 180.  In the future, if you need to reach out to the patient assistance program regarding refills or to find out the status of your application, you can do so by calling:   [employer] at [phone]   Thank you!   Estelle Grumbles, PharmD, [facility] [phone]

## 2023-02-21 NOTE — Progress Notes (Signed)
 Pharmacy Medication Assistance Program Note    02/21/2023  Patient ID: Sandy Salinas, female   DOB: [birth year], 73 y.o.   MRN: [id number]     12/27/2022 02/21/2023  Outreach Medication One  Initial Outreach Date (Medication One) 11/10/2022   Manufacturer Medication One [employer]   [employer] Drugs Ozempic    Dose of Ozempic  2mg /26ml   Type of Sport and exercise psychologist   Date Application Sent to Patient 11/10/2022   Application Items Requested Application;Proof of Income;Other   Date Application Sent to Prescriber 11/16/2022   Name of Prescriber Rockney Cid   Date Application Received From Patient 12/20/2022   Date Application Received From Provider 11/20/2022   Date Application Submitted to Manufacturer 12/27/2022   Method Application Sent to Manufacturer Fax   Patient Assistance Determination  Approved  Approval Start Date  02/07/2023  Approval End Date  02/06/2024  Additional Outreach Contact  Provider  Contacted Provider  Message    Care coordination call placed to [employer] in regard to Ozempic . Spoke to [place] who informs patient is APPROVED 02/07/23-02/06/24. Medication will auto fill and ship to prescriber's office based on last time filled in 2024. Patient may call [employer] at any time to check on next shipment by calling [phone]. [facility] PharmD has been made aware of patient's approval.  Apolonio Cutting, CPhT [place]  Office: [phone] Fax: [phone] Email: [email or web address]

## 2023-02-21 NOTE — Progress Notes (Signed)
 02/21/2023 Name: Sandy Salinas MRN: [id number] DOB: May 26, 1950  Chief Complaint  Patient presents with   Medication Assistance    Sandy Salinas is a 73 y.o. year old female who presented for a telephone visit.   They were referred to the pharmacist by their PCP for assistance in managing medication access.    Conversation today limited as patient reports that she is not currently home   Subjective:   Care Team: Primary Care Provider: Rockney Cid, DO Rheumatology: Melissa Spring, MD; Next Scheduled Visit: 03/20/2023   Medication Access/Adherence  Current Pharmacy:  [id number] - [place],  - [address] AT [facility] [address] [place] [address] [place] [address] Phone: [phone] Fax: [phone]  [facility] - [facility] [address] [place] [address] Phone: [phone] Fax: [phone]   Patient reports affordability concerns with their medications: Yes  Patient reports access/transportation concerns to their pharmacy: No  Patient reports adherence concerns with their medications:  No     Reports uses a weekly pillbox to organize her medications     Diabetes:   Current medications:  - metformin  1000 mg - 1/2 tablet (500 mg) twice daily with meals - Previously on Ozempic , but off for the past months as ran out of medication   Reports last checked her home blood sugar yesterday before supper, recalls reading: 150   Statin therapy: atorvastatin  80 mg daily   Current medication access support: Collaborating with PCP and CPhT to aid patient with applying for patient assistance for Ozempic  from [employer] - Today patient reports that she received a letter from [employer] advising that she was approved for enrollment in the assistance program for 2025     Objective:  Lab Results  Component Value Date   HGBA1C 7.2 (A) 11/07/2022    Lab Results  Component Value  Date   CREATININE 0.88 05/11/2022   BUN 17 05/11/2022   NA 140 05/11/2022   K 4.6 05/11/2022   CL 101 05/11/2022   CO2 30 05/11/2022    Lab Results  Component Value Date   CHOL 142 05/11/2022   HDL 35 (L) 05/11/2022   LDLCALC 79 05/11/2022   TRIG 190 (H) 05/11/2022   CHOLHDL 4.1 05/11/2022    Current Outpatient Medications on File Prior to Visit  Medication Sig Dispense Refill   Ascorbic Acid (VITAMIN C) 1000 MG tablet Take 1,000 mg by mouth daily.     atorvastatin  (LIPITOR) 80 MG tablet TAKE 1 TABLET(80 MG) BY MOUTH AT BEDTIME 90 tablet 3   blood glucose meter kit and supplies KIT Dispense based on patient and insurance preference. Use to monitor blood sugars once daily as directed. 1 each 0   Calcium  Carbonate-Vitamin D  (CALTRATE 600+D PO) Take by mouth daily.     chlorthalidone  (HYGROTON ) 25 MG tablet TAKE 1 TABLET(25 MG) BY MOUTH DAILY 90 tablet 0   DULoxetine  (CYMBALTA ) 30 MG capsule Take 1 capsule (30 mg total) by mouth at bedtime. 90 capsule 0   lisinopril  (ZESTRIL ) 20 MG tablet TAKE 1 TABLET(20 MG) BY MOUTH DAILY 90 tablet 0   metFORMIN  (GLUCOPHAGE ) 1000 MG tablet Take 0.5 tablets (500 mg total) by mouth 2 (two) times daily with a meal. 180 tablet 2   methylPREDNISolone  (MEDROL  DOSEPAK) 4 MG TBPK tablet Day 1: Take 8 mg (2 tablets) before breakfast, 4 mg (1 tablet) after lunch, 4 mg (1 tablet) after  supper, and 8 mg (2 tablets) at bedtime. Day 2:Take 4 mg (1 tablet) before breakfast, 4 mg (1 tablet) after lunch, 4 mg (1 tablet) after supper, and 8 mg (2 tablets) at bedtime. Day 3: Take 4 mg (1 tablet) before breakfast, 4 mg (1 tablet) after lunch, 4 mg (1 tablet) after supper, and 4 mg (1 tablet) at bedtime. Day 4: Take 4 mg (1 tablet) before breakfast, 4 mg (1 tablet) after lunch, and 4 mg (1 tablet) at bedtime. Day 5: Take 4 mg (1 tablet) before breakfast and 4 mg (1 tablet) at bedtime. Day 6: Take 4 mg (1 tablet) before breakfast. 1 each 0   Multiple Vitamin (MULTIVITAMIN)  tablet Take 1 tablet by mouth daily. Pt taking QHS     Semaglutide ,0.25 or 0.5MG /DOS, (OZEMPIC , 0.25 OR 0.5 MG/DOSE,) 2 MG/3ML SOPN Inject 0.25 mg into the skin once a week. 3 mL 0   vitamin B-12 (CYANOCOBALAMIN ) 100 MCG tablet Take by mouth daily. Pt taking 1000 mcg daily QHS     No current facility-administered medications on file prior to visit.       Assessment/Plan:   Diabetes: - Have reviewed dietary modifications including importance of having regular well-balanced meals throughout the day, while controlling carbohydrate portion sizes - Recommend to check glucose, keep log of results and have this record to review at upcoming medical appointments. Patient to contact provider office sooner if needed for readings outside of established parameters or symptoms - Collaborate with CPhT Aleda Hurl Simcox who confirms patient is enrolled in patient assistance for Ozempic  from [employer] through 02/06/2024. Advises, per program, order began processing on 02/07/23 and should be delivered to prescriber's office in the next 10-14 business days.  Provide update to patient     Follow Up Plan: Clinical Pharmacist will follow up with patient by telephone again next month   Arthur Lash, PharmD, [facility] [phone]

## 2023-03-28 ENCOUNTER — Other Ambulatory Visit: Payer: Self-pay | Admitting: Pharmacist

## 2023-03-28 DIAGNOSIS — E669 Obesity, unspecified: Secondary | ICD-10-CM

## 2023-03-28 NOTE — Progress Notes (Signed)
03/28/2023 Name: Sandy Salinas MRN: [id number] DOB: 08/09/1950  Chief Complaint  Patient presents with   Medication Assistance   Medication Management    Sandy Salinas is a 73 y.o. year old female who presented for a telephone visit.   They were referred to the pharmacist by their PCP for assistance in managing medication access.    Subjective:   Care Team: Primary Care Provider: Margarita Mail, DO Rheumatology: Tracey Harries, MD; Next Scheduled Visit: 06/06/2023  Medication Access/Adherence  Current Pharmacy:  [id number] [place], [place] - [address] AT [facility] [address] [place] [address] [place] [address] Phone: [phone] Fax: [phone]  [facility] - [facility] [address] [place] [address] Phone: [phone] Fax: [phone]   Patient reports affordability concerns with their medications: Yes  Patient reports access/transportation concerns to their pharmacy: No  Patient reports adherence concerns with their medications:  No     Reports uses a weekly pillbox to organize her medications     Diabetes:   Current medications:  - metformin 1000 mg - 1/2 tablet (500 mg) twice daily with meals - Previously on Ozempic, but off for the past months as ran out of medication   Reports recent morning fasting blood sugar readings ranging: 100-130 - Attributes variation to carbohydrate portion sizes as supper the night prior   Statin therapy: atorvastatin 80 mg daily   Current medication access support: Enrolled in patient assistance program for Ozempic from [employer] through 02/06/2024 - Today reports she has not yet received a shipment of Ozempic from manufacturer   Objective:  Lab Results  Component Value Date   HGBA1C 7.2 (A) 11/07/2022    Lab Results  Component Value Date   CREATININE 0.88 05/11/2022   BUN 17 05/11/2022   NA 140 05/11/2022   K 4.6 05/11/2022    CL 101 05/11/2022   CO2 30 05/11/2022    Lab Results  Component Value Date   CHOL 142 05/11/2022   HDL 35 (L) 05/11/2022   LDLCALC 79 05/11/2022   TRIG 190 (H) 05/11/2022   CHOLHDL 4.1 05/11/2022    Current Outpatient Medications on File Prior to Visit  Medication Sig Dispense Refill   Ascorbic Acid (VITAMIN C) 1000 MG tablet Take 1,000 mg by mouth daily.     atorvastatin (LIPITOR) 80 MG tablet TAKE 1 TABLET(80 MG) BY MOUTH AT BEDTIME 90 tablet 3   blood glucose meter kit and supplies KIT Dispense based on patient and insurance preference. Use to monitor blood sugars once daily as directed. 1 each 0   Calcium Carbonate-Vitamin D (CALTRATE 600+D PO) Take by mouth daily.     chlorthalidone (HYGROTON) 25 MG tablet TAKE 1 TABLET(25 MG) BY MOUTH DAILY 90 tablet 0   DULoxetine (CYMBALTA) 30 MG capsule Take 1 capsule (30 mg total) by mouth at bedtime. 90 capsule 0   lisinopril (ZESTRIL) 20 MG tablet TAKE 1 TABLET(20 MG) BY MOUTH DAILY 90 tablet 0   metFORMIN (GLUCOPHAGE) 1000 MG tablet Take 0.5 tablets (500 mg total) by mouth 2 (two) times daily with a meal. 180 tablet 2   methylPREDNISolone (MEDROL DOSEPAK) 4 MG TBPK tablet Day 1: Take 8 mg (2 tablets) before breakfast, 4 mg (1 tablet) after lunch, 4 mg (1 tablet) after supper, and 8 mg (2 tablets) at bedtime. Day 2:Take 4 mg (1 tablet) before breakfast, 4 mg (1 tablet) after lunch,  4 mg (1 tablet) after supper, and 8 mg (2 tablets) at bedtime. Day 3: Take 4 mg (1 tablet) before breakfast, 4 mg (1 tablet) after lunch, 4 mg (1 tablet) after supper, and 4 mg (1 tablet) at bedtime. Day 4: Take 4 mg (1 tablet) before breakfast, 4 mg (1 tablet) after lunch, and 4 mg (1 tablet) at bedtime. Day 5: Take 4 mg (1 tablet) before breakfast and 4 mg (1 tablet) at bedtime. Day 6: Take 4 mg (1 tablet) before breakfast. 1 each 0   Multiple Vitamin (MULTIVITAMIN) tablet Take 1 tablet by mouth daily. Pt taking QHS     Semaglutide,0.25 or 0.5MG /DOS, (OZEMPIC,  0.25 OR 0.5 MG/DOSE,) 2 MG/3ML SOPN Inject 0.25 mg into the skin once a week. 3 mL 0   vitamin B-12 (CYANOCOBALAMIN) 100 MCG tablet Take by mouth daily. Pt taking 1000 mcg daily QHS     No current facility-administered medications on file prior to visit.      Assessment/Plan:   Encourage patient to contact office to schedule follow up appointment with PCP  Diabetes: - Have reviewed dietary modifications including importance of having regular well-balanced meals throughout the day, while controlling carbohydrate portion sizes - Recommend to check glucose, keep log of results and have this record to review at upcoming medical appointments. Patient to contact provider office sooner if needed for readings outside of established parameters or symptoms - Recommend patient to [employer] [phone]) today to request 68-month supply voucher for Ozempic as medication is not received >14 business days after 02/07/2023 From review of chart, note PCP sent a 1 month supply prescription for Ozempic 0.25 mg weekly to [facility] for patient in October, which can be used for this voucher   Follow Up Plan: Clinical Pharmacist will follow up with patient by telephone next month   Estelle Grumbles, PharmD, [facility] [phone]

## 2023-03-28 NOTE — Patient Instructions (Signed)
Goals Addressed             This Visit's Progress    Pharmacy Goals       The goal A1c is less than 7%. This is the best way to reduce the risk of the long term complications of diabetes, including heart disease, kidney disease, eye disease, strokes, and nerve damage. An A1c of less than 7% corresponds with fasting sugars less than 130 and 2 hour after meal sugars less than 180.  In the future, if you need to reach out to the patient assistance program regarding refills or to find out the status of your application, you can do so by calling:   [employer] at [phone]   Thank you!   Estelle Grumbles, PharmD, [facility] [phone]

## 2023-04-12 ENCOUNTER — Other Ambulatory Visit: Payer: Self-pay | Admitting: Internal Medicine

## 2023-04-12 ENCOUNTER — Encounter: Payer: Self-pay | Admitting: Internal Medicine

## 2023-04-12 DIAGNOSIS — N3946 Mixed incontinence: Secondary | ICD-10-CM

## 2023-04-14 ENCOUNTER — Encounter: Payer: Self-pay | Admitting: Internal Medicine

## 2023-04-19 ENCOUNTER — Encounter: Payer: Self-pay | Admitting: Internal Medicine

## 2023-04-19 ENCOUNTER — Other Ambulatory Visit: Payer: Self-pay

## 2023-04-19 ENCOUNTER — Ambulatory Visit: Admitting: Internal Medicine

## 2023-04-19 VITALS — BP 130/74 | HR 88 | Temp 98.5°F | Resp 16 | Ht 64.0 in | Wt 167.2 lb

## 2023-04-19 DIAGNOSIS — M1990 Unspecified osteoarthritis, unspecified site: Secondary | ICD-10-CM | POA: Diagnosis not present

## 2023-04-19 DIAGNOSIS — Z7985 Long-term (current) use of injectable non-insulin antidiabetic drugs: Secondary | ICD-10-CM | POA: Diagnosis not present

## 2023-04-19 DIAGNOSIS — E782 Mixed hyperlipidemia: Secondary | ICD-10-CM | POA: Diagnosis not present

## 2023-04-19 DIAGNOSIS — I10 Essential (primary) hypertension: Secondary | ICD-10-CM | POA: Diagnosis not present

## 2023-04-19 DIAGNOSIS — E1165 Type 2 diabetes mellitus with hyperglycemia: Secondary | ICD-10-CM | POA: Diagnosis not present

## 2023-04-19 LAB — POCT GLYCOSYLATED HEMOGLOBIN (HGB A1C): Hemoglobin A1C: 7.5 % — AB (ref 4.0–5.6)

## 2023-04-19 MED ORDER — METFORMIN HCL 1000 MG PO TABS: 500.0000 mg | ORAL_TABLET | Freq: Two times a day (BID) | ORAL | 2 refills | Status: DC

## 2023-04-19 MED ORDER — DULOXETINE HCL 30 MG PO CPEP
30.0000 mg | ORAL_CAPSULE | Freq: Every evening | ORAL | 0 refills | Status: DC
Start: 2023-04-19 — End: 2023-09-18

## 2023-04-19 MED ORDER — LISINOPRIL 20 MG PO TABS: 20.0000 mg | ORAL_TABLET | Freq: Every day | ORAL | 1 refills | Status: DC

## 2023-04-19 MED ORDER — CHLORTHALIDONE 25 MG PO TABS: ORAL_TABLET | ORAL | 1 refills | Status: DC

## 2023-04-19 NOTE — Assessment & Plan Note (Signed)
 A1c 7.5%, too high for surgery. Will start back on Ozempic 0.25 mg for 4 weeks, then increased to 0.5 mg.

## 2023-04-19 NOTE — Assessment & Plan Note (Signed)
 Plan to recheck fasting labs at follow up, continue statin.

## 2023-04-19 NOTE — Assessment & Plan Note (Signed)
 Doing well on Cymbalta, refill.

## 2023-04-19 NOTE — Assessment & Plan Note (Signed)
 Blood pressure stable here today, no changes made to medications and appropriate refills sent to pharmacy.

## 2023-04-19 NOTE — Progress Notes (Signed)
 Established Patient Office Visit  Subjective:  Patient ID: Sandy Salinas, female    DOB: 1950/11/05  Age: 73 y.o. MRN: [id number]  CC:  Chief Complaint  Patient presents with   surgical clearance    Son[place]ello for thigh procedure    HPI Sandy Salinas presents for surgical pre-op for Sonobello procedure, however A1c is too high today.   Hypertension: -Medications: Chlorthalidone 25 mg, Lisinopril 10 mg -Patient is compliant with above medications and reports no side effects. -Checking BP at home (average): 120-130/70-80 -Denies any SOB, CP, vision changes, LE edema or symptoms of hypotension  HLD: -Medications: Lipitor 80 mg -Patient is compliant with above medications and reports no side effects.  -Last lipid panel: Lipid Panel     Component Value Date/Time   CHOL 142 05/11/2022 0830   CHOL 152 02/18/2015 0841   TRIG 190 (H) 05/11/2022 0830   HDL 35 (L) 05/11/2022 0830   HDL 40 02/18/2015 0841   CHOLHDL 4.1 05/11/2022 0830   VLDL 35 (H) 06/06/2016 0831   LDLCALC 79 05/11/2022 0830   LABVLDL 28 02/18/2015 0841   Diabetes, Type 2: -Last A1c 10/24 7.2 -Medications: Metformin 500 mg BID, had been on Ozempic 0.5 mg weekly but due to supply/insurance issues has not been on in several months  -Medications tried: She cannot take SGLT2's, as she had multiple UTIs while on Jardiance.  We tried glipizide this past month, even with 2.5 dose the patient had an episode of hypoglycemia.  She cannot tolerate any higher doses of metformin due to GI side effects. -Diet: working on diet, eating more fruit and less processed carbs -Exercise: No regular exercise but active, does yard work -Eye exam: UTD  -Foot exam: UTD 10/24 -Microalbumin: UTD  -Statin: yes -PNA vaccine: UTD -Denies symptoms of hypoglycemia, polyuria, polydipsia, numbness extremities, foot ulcers/trauma.   Osteoporosis:  -Currently on calcium and Vitamin D -DEXA 10/24 t score -2.5 in the lumbar spine, -2.1  in the right femur, -2.9 in the forearm  Urge Incontinence:  -Currently on Myrbetriq and following with Urology   OA: -Unable to take anti-inflammatories secondary to history of gastric bypass -Has been given Cymbalta 30 mg to take at night to help with pain by rheumatology,  Health Maintenance: -Blood work UTD -Mammogram 9/24 BIRADS-1 -Cologuard 6/22 negative   Past Medical History:  Diagnosis Date   Abdominal aortic ectasia (HCC) 05/19/2016   [place] April 2018, rescan in five years (April 2023)   Breast calcification seen on mammogram 12/01/2016   LEFT   Cataract    Depression 03/18/2021   Diabetes mellitus without complication (HCC)    Family history of abdominal aortic aneurysm (AAA) 05/12/2016   father   Hyperlipidemia    Hypertension    Lumbar radiculopathy, acute    Microalbuminuria due to type 2 diabetes mellitus (HCC)    Obesity    Osteoporosis    Primary osteoarthritis of left knee    Psoriasis, unspecified    Pyelonephritis 03/17/2021   Seizures (HCC)     Past Surgical History:  Procedure Laterality Date   ABDOMINAL HYSTERECTOMY  1975   total   BREAST BIOPSY Right 04/09/2012   x 2, FIBROADENOMA WITH COARSE CALCIFICATIONS.done by byrnett   BREAST BIOPSY Left 09/04/2017   Affirm Bx- X clip, FIBROADENOMATOUS CHANGE WITH CALCIFICATIONS. NEGATIVE FOR ATYPIA AND MALIGNANCY   CYSTECTOMY  2003   ROUX-EN-Y GASTRIC BYPASS  2009   TONSILLECTOMY  1970   TUBAL LIGATION  1975  Family History  Problem Relation Age of Onset   Alzheimer's disease Mother    Heart murmur Mother    Dementia Mother    Arthritis Mother    AAA (abdominal aortic aneurysm) Father    Heart disease Father    Asthma Sister    Hypertension Sister    Cancer Sister        breast   Breast cancer Sister        late 1's   Heart disease Brother    Hypertension Brother    Aneurysm Paternal Aunt    Lung cancer Paternal Uncle    Cancer Paternal Grandmother        possible, not sure what kind    Cancer Cousin     Social History   Socioeconomic History   Marital status: Married    Spouse name: Merchant navy officer   Number of children: 2   Years of education: Not on file   Highest education level: Associate degree: occupational, Scientist, product/process development, or vocational program  Occupational History   Occupation: retired  Tobacco Use   Smoking status: Never   Smokeless tobacco: Never  Vaping Use   Vaping status: Never Used  Substance and Sexual Activity   Alcohol use: No   Drug use: No   Sexual activity: Not Currently    Birth control/protection: Abstinence    Comment: Old Age  Other Topics Concern   Not on file  Social History Narrative   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    Social Drivers of Corporate investment banker Strain: Low Risk  (11/13/2022)   Overall Financial Resource Strain (CARDIA)    Difficulty of Paying Living Expenses: Not hard at all  Food Insecurity: No Food Insecurity (11/13/2022)   Hunger Vital Sign    Worried About Running Out of Food in the Last Year: Never true    Ran Out of Food in the Last Year: Never true  Transportation Needs: No Transportation Needs (11/13/2022)   PRAPARE - Administrator, Civil Service (Medical): No    Lack of Transportation (Non-Medical): No  Physical Activity: Inactive (11/13/2022)   Exercise Vital Sign    Days of Exercise per Week: 0 days    Minutes of Exercise per Session: 0 min  Stress: No Stress Concern Present (11/13/2022)   [employer] of Occupational Health - Occupational Stress Questionnaire    Feeling of Stress : Not at all  Social Connections: Moderately Integrated (11/13/2022)   Social Connection and Isolation Panel [NHANES]    Frequency of Communication with Friends and Family: More than three times a week    Frequency of Social Gatherings with Friends and Family: More than three times a week    Attends Religious Services: More than 4 times per year    Active Member of [employer] or Organizations: No     Attends Banker Meetings: Never    Marital Status: Married  Catering manager Violence: Not At Risk (11/16/2022)   Humiliation, Afraid, Rape, and Kick questionnaire    Fear of Current or Ex-Partner: No    Emotionally Abused: No    Physically Abused: No    Sexually Abused: No    Outpatient Medications Prior to Visit  Medication Sig Dispense Refill   Ascorbic Acid (VITAMIN C) 1000 MG tablet Take 1,000 mg by mouth daily.     atorvastatin (LIPITOR) 80 MG tablet TAKE 1 TABLET(80 MG) BY MOUTH AT BEDTIME 90 tablet 3   Calcium Carbonate-Vitamin D (  CALTRATE 600+D PO) Take by mouth daily.     chlorthalidone (HYGROTON) 25 MG tablet TAKE 1 TABLET(25 MG) BY MOUTH DAILY 90 tablet 0   DULoxetine (CYMBALTA) 30 MG capsule Take 1 capsule (30 mg total) by mouth at bedtime. 90 capsule 0   lisinopril (ZESTRIL) 20 MG tablet TAKE 1 TABLET(20 MG) BY MOUTH DAILY 90 tablet 0   metFORMIN (GLUCOPHAGE) 1000 MG tablet Take 0.5 tablets (500 mg total) by mouth 2 (two) times daily with a meal. 180 tablet 2   Multiple Vitamin (MULTIVITAMIN) tablet Take 1 tablet by mouth daily. Pt taking QHS     vitamin B-12 (CYANOCOBALAMIN) 100 MCG tablet Take by mouth daily. Pt taking 1000 mcg daily QHS     blood glucose meter kit and supplies KIT Dispense based on patient and insurance preference. Use to monitor blood sugars once daily as directed. (Patient not taking: Reported on 04/19/2023) 1 each 0   methylPREDNISolone (MEDROL DOSEPAK) 4 MG TBPK tablet Day 1: Take 8 mg (2 tablets) before breakfast, 4 mg (1 tablet) after lunch, 4 mg (1 tablet) after supper, and 8 mg (2 tablets) at bedtime. Day 2:Take 4 mg (1 tablet) before breakfast, 4 mg (1 tablet) after lunch, 4 mg (1 tablet) after supper, and 8 mg (2 tablets) at bedtime. Day 3: Take 4 mg (1 tablet) before breakfast, 4 mg (1 tablet) after lunch, 4 mg (1 tablet) after supper, and 4 mg (1 tablet) at bedtime. Day 4: Take 4 mg (1 tablet) before breakfast, 4 mg (1 tablet) after  lunch, and 4 mg (1 tablet) at bedtime. Day 5: Take 4 mg (1 tablet) before breakfast and 4 mg (1 tablet) at bedtime. Day 6: Take 4 mg (1 tablet) before breakfast. (Patient not taking: Reported on 04/19/2023) 1 each 0   Semaglutide,0.25 or 0.5MG /DOS, (OZEMPIC, 0.25 OR 0.5 MG/DOSE,) 2 MG/3ML SOPN Inject 0.25 mg into the skin once a week. (Patient not taking: Reported on 04/19/2023) 3 mL 0   No facility-administered medications prior to visit.    Allergies  Allergen Reactions   Codeine Nausea Only and Other (See Comments)    Hallucinations   Jardiance [Empagliflozin] Other (See Comments)    Recurrent UTI, yeast infection    ROS Review of Systems  All other systems reviewed and are negative.     Objective:    Physical Exam Constitutional:      Appearance: Normal appearance.  HENT:     Head: Normocephalic and atraumatic.  Eyes:     Conjunctiva/sclera: Conjunctivae normal.  Cardiovascular:     Rate and Rhythm: Normal rate and regular rhythm.  Pulmonary:     Effort: Pulmonary effort is normal.     Breath sounds: Normal breath sounds.  Skin:    General: Skin is warm and dry.  Neurological:     General: No focal deficit present.     Mental Status: She is alert. Mental status is at baseline.  Psychiatric:        Mood and Affect: Mood normal.        Behavior: Behavior normal.     BP 130/74 (Cuff Size: Normal)   Pulse 88   Temp 98.5 F (36.9 C) (Oral)   Resp 16   Ht 5\' 4"  (1.626 m)   Wt 167 lb 3.2 oz (75.8 kg)   SpO2 96%   BMI 28.70 kg/m  Wt Readings from Last 3 Encounters:  04/19/23 167 lb 3.2 oz (75.8 kg)  12/25/22 167 lb 6.4 oz (75.9  kg)  11/16/22 164 lb (74.4 kg)     Health Maintenance Due  Topic Date Due   COVID-19 Vaccine (9 - 2024-25 season) 10/08/2022   Diabetic kidney evaluation - eGFR measurement  05/11/2023    There are no preventive care reminders to display for this patient.  Lab Results  Component Value Date   TSH 1.56 02/24/2021   Lab Results   Component Value Date   WBC 7.2 05/11/2022   HGB 12.1 05/11/2022   HCT 38.4 05/11/2022   MCV 83.7 05/11/2022   PLT 536 (H) 05/11/2022   Lab Results  Component Value Date   NA 140 05/11/2022   K 4.6 05/11/2022   CO2 30 05/11/2022   GLUCOSE 90 05/11/2022   BUN 17 05/11/2022   CREATININE 0.88 05/11/2022   BILITOT 0.3 05/11/2022   ALKPHOS 61 03/17/2021   AST 26 05/11/2022   ALT 15 05/11/2022   PROT 6.8 05/11/2022   ALBUMIN 3.6 03/17/2021   CALCIUM 9.7 05/11/2022   ANIONGAP 8 03/18/2021   EGFR 70 05/11/2022   Lab Results  Component Value Date   CHOL 142 05/11/2022   Lab Results  Component Value Date   HDL 35 (L) 05/11/2022   Lab Results  Component Value Date   LDLCALC 79 05/11/2022   Lab Results  Component Value Date   TRIG 190 (H) 05/11/2022   Lab Results  Component Value Date   CHOLHDL 4.1 05/11/2022   Lab Results  Component Value Date   HGBA1C 7.5 (A) 04/19/2023      Assessment & Plan:   Type 2 diabetes mellitus with hyperglycemia, without long-term current use of insulin (HCC) Assessment & Plan: A1c 7.5%, too high for surgery. Will start back on Ozempic 0.25 mg for 4 weeks, then increased to 0.5 mg.   Orders: -     POCT glycosylated hemoglobin (Hb A1C) -     metFORMIN HCl; Take 0.5 tablets (500 mg total) by mouth 2 (two) times daily with a meal.  Dispense: 180 tablet; Refill: 2  Primary hypertension Assessment & Plan: Blood pressure stable here today, no changes made to medications and appropriate refills sent to pharmacy.    Orders: -     Chlorthalidone; TAKE 1 TABLET(25 MG) BY MOUTH DAILY  Dispense: 90 tablet; Refill: 1 -     Lisinopril; Take 1 tablet (20 mg total) by mouth daily.  Dispense: 90 tablet; Refill: 1  Mixed hyperlipidemia Assessment & Plan: Plan to recheck fasting labs at follow up, continue statin.    Arthritis Assessment & Plan: Doing well on Cymbalta, refill.   Orders: -     DULoxetine HCl; Take 1 capsule (30 mg total)  by mouth at bedtime.  Dispense: 90 capsule; Refill: 0      Follow-up: Return in about 3 months (around 07/20/2023).    Margarita Mail, DO

## 2023-04-25 ENCOUNTER — Other Ambulatory Visit: Payer: Self-pay | Admitting: Pharmacist

## 2023-04-25 DIAGNOSIS — E1169 Type 2 diabetes mellitus with other specified complication: Secondary | ICD-10-CM

## 2023-04-25 NOTE — Patient Instructions (Signed)
 Goals Addressed             This Visit's Progress    Pharmacy Goals       The goal A1c is less than 7%. This is the best way to reduce the risk of the long term complications of diabetes, including heart disease, kidney disease, eye disease, strokes, and nerve damage. An A1c of less than 7% corresponds with fasting sugars less than 130 and 2 hour after meal sugars less than 180.  In the future, if you need to reach out to the patient assistance program regarding refills or to find out the status of your application, you can do so by calling:   [employer] at [phone]   Thank you!   Estelle Grumbles, PharmD, [facility] [phone]

## 2023-04-25 NOTE — Progress Notes (Signed)
 04/25/2023 Name: Sandy Salinas MRN: [id number] DOB: 1950-08-04  No chief complaint on file.   Sandy Salinas is a 73 y.o. year old female who presented for a telephone visit.   They were referred to the pharmacist by their PCP for assistance in managing medication access.    Subjective:   Care Team: Primary Care Provider: Margarita Mail, DO; Next Scheduled Visit: 05/23/2023 Rheumatology: Tracey Harries, MD  Medication Access/Adherence  Current Pharmacy:  [facility] [phone] Cheree Ditto, [place] - [address] AT [facility] [address] [place] [address] [place] [address] Phone: [phone] Fax: [phone]  [facility] [address] [place] [address] Phone: [phone] Fax: [phone]   Patient reports affordability concerns with their medications: No  Patient reports access/transportation concerns to their pharmacy: No  Patient reports adherence concerns with their medications:  No     Reports uses a weekly pillbox to organize her medications     Diabetes:   Current medications:  - metformin 1000 mg - 1/2 tablet (500 mg) twice daily with meals - Ozempic 0.25 mg weekly on Mondays (restarted on 04/16/2023)   Reports recent morning fasting blood sugar readings ranging: 110-115; today: 115  Reports working on improving diet, including eating more non-starchy vegetables and cutting back on bread   Statin therapy: atorvastatin 80 mg daily   Current medication access support: Enrolled in patient assistance program for Ozempic from [employer] through 02/06/2024 - Today reports picked up supply of Ozempic from manufacturer at office 2 weeks ago   Objective:  Lab Results  Component Value Date   HGBA1C 7.5 (A) 04/19/2023    Lab Results  Component Value Date   CREATININE 0.88 05/11/2022   BUN 17 05/11/2022   NA 140 05/11/2022   K 4.6 05/11/2022   CL 101 05/11/2022   CO2 30  05/11/2022    Lab Results  Component Value Date   CHOL 142 05/11/2022   HDL 35 (L) 05/11/2022   LDLCALC 79 05/11/2022   TRIG 190 (H) 05/11/2022   CHOLHDL 4.1 05/11/2022    Medications Reviewed Today     Reviewed by Manuela Neptune, RPH-CPP (Pharmacist) on 04/25/23 at 1444  Med List Status: <None>   Medication Order Taking? Sig Documenting Provider Last Dose Status Informant  Ascorbic Acid (VITAMIN C) 1000 MG tablet [id number]  Take 1,000 mg by mouth daily. [provider]  Active   atorvastatin (LIPITOR) 80 MG tablet [id number]  TAKE 1 TABLET(80 MG) BY MOUTH AT BEDTIME Margarita Mail, DO  Active   blood glucose meter kit and supplies KIT [id number]  Dispense based on patient and insurance preference. Use to monitor blood sugars once daily as directed.  Patient not taking: Reported on 04/19/2023   Margarita Mail, DO  Active   Calcium Carbonate-Vitamin D (CALTRATE 600+D PO) [id number]  Take by mouth daily. [provider]  Active Multiple Informants           Med Note Juanetta Gosling   Tue Aug 19, 2019  3:01 PM)    chlorthalidone (HYGROTON) 25 MG tablet [id number]  TAKE 1 TABLET(25 MG) BY MOUTH DAILY Margarita Mail, DO  Active   DULoxetine (CYMBALTA) 30 MG capsule [id number]  Take 1 capsule (30 mg total) by mouth at bedtime. Margarita Mail, DO  Active   lisinopril (ZESTRIL) 20 MG tablet [id number]  Take 1 tablet (20 mg total) by mouth daily.  Margarita Mail, DO  Active   metFORMIN (GLUCOPHAGE) 1000 MG tablet [id number] Yes Take 0.5 tablets (500 mg total) by mouth 2 (two) times daily with a meal. Margarita Mail, DO Taking Active   Multiple Vitamin (MULTIVITAMIN) tablet [id number]  Take 1 tablet by mouth daily. Pt taking QHS [provider]  Active Multiple Informants  Semaglutide,0.25 or 0.5MG /DOS, (OZEMPIC, 0.25 OR 0.5 MG/DOSE,) 2 MG/3ML SOPN [id number] Yes Inject 0.25 mg into the skin once a week. Margarita Mail, DO Taking Active   vitamin  B-12 (CYANOCOBALAMIN) 100 MCG tablet [id number]  Take by mouth daily. Pt taking 1000 mcg daily QHS [provider]  Active Multiple Informants              Assessment/Plan:   Diabetes: - Reviewed dietary modifications including importance of having regular well-balanced meals throughout the day, while controlling carbohydrate portion sizes - Recommend to check glucose, keep log of results and have this record to review at upcoming medical appointments. Patient to contact provider office sooner if needed for readings outside of established parameters or symptoms - Patient to contact [employer] as needed for refills of Ozempic   Follow Up Plan: Clinical Pharmacist will follow up with patient by telephone on 12/05/2023 at 2:00 PM    Estelle Grumbles, PharmD, [facility] [phone]

## 2023-05-02 ENCOUNTER — Other Ambulatory Visit: Payer: Self-pay | Admitting: Internal Medicine

## 2023-05-02 ENCOUNTER — Telehealth: Payer: Self-pay

## 2023-05-02 ENCOUNTER — Encounter: Payer: Self-pay | Admitting: Internal Medicine

## 2023-05-02 NOTE — Telephone Encounter (Signed)
 Copied from CRM [id number]. Topic: Clinical - Request for Lab/Test Order >> May 02, 2023  7:50 AM Clayton Bibles wrote: Reason for CRM: Sandy Salinas would like to come in around 3:30 today to check her A1C. There is no order in her chart. Please give her a call at [phone]

## 2023-05-02 NOTE — Telephone Encounter (Signed)
 Left message for patient

## 2023-05-02 NOTE — Telephone Encounter (Signed)
 Pt insisted to still get it done and will do self pay for it.

## 2023-05-23 ENCOUNTER — Ambulatory Visit (INDEPENDENT_AMBULATORY_CARE_PROVIDER_SITE_OTHER)

## 2023-05-23 DIAGNOSIS — E1165 Type 2 diabetes mellitus with hyperglycemia: Secondary | ICD-10-CM | POA: Diagnosis not present

## 2023-05-23 LAB — POCT GLYCOSYLATED HEMOGLOBIN (HGB A1C): Hemoglobin A1C: 7.1 % — AB (ref 4.0–5.6)

## 2023-06-21 ENCOUNTER — Encounter: Payer: Self-pay | Admitting: Internal Medicine

## 2023-06-22 ENCOUNTER — Other Ambulatory Visit: Payer: Self-pay

## 2023-06-22 ENCOUNTER — Ambulatory Visit (INDEPENDENT_AMBULATORY_CARE_PROVIDER_SITE_OTHER): Admitting: Nurse Practitioner

## 2023-06-22 ENCOUNTER — Encounter: Payer: Self-pay | Admitting: Nurse Practitioner

## 2023-06-22 VITALS — BP 124/78 | HR 75 | Temp 97.8°F | Resp 16 | Ht 64.0 in | Wt 163.5 lb

## 2023-06-22 DIAGNOSIS — L255 Unspecified contact dermatitis due to plants, except food: Secondary | ICD-10-CM | POA: Diagnosis not present

## 2023-06-22 MED ORDER — TRIAMCINOLONE ACETONIDE 0.1 % EX OINT: 1.0000 | TOPICAL_OINTMENT | Freq: Two times a day (BID) | CUTANEOUS | 6 refills | Status: DC

## 2023-06-22 MED ORDER — PREDNISONE 20 MG PO TABS: ORAL_TABLET | ORAL | 0 refills | Status: DC

## 2023-06-22 MED ORDER — HYDROXYZINE PAMOATE 25 MG PO CAPS: 25.0000 mg | ORAL_CAPSULE | Freq: Three times a day (TID) | ORAL | 0 refills | Status: DC | PRN

## 2023-06-22 NOTE — Progress Notes (Signed)
 BP 124/78 (Cuff Size: Normal)   Pulse 75   Temp 97.8 F (36.6 C) (Oral)   Resp 16   Ht 5\' 4"  (1.626 m)   Wt 163 lb 8 oz (74.2 kg)   SpO2 97%   BMI 28.06 kg/m    Subjective:    Patient ID: Sandy Salinas, female    DOB: Jul 25, 1950, 73 y.o.   MRN: [id number]  HPI: LENIX MCCORMAC is a 73 y.o. female  Chief Complaint  Patient presents with   Poison Ivy    All over for 1 week    Discussed the use of AI scribe software for clinical note transcription with the patient, who gave verbal consent to proceed.  History of Present Illness ODA BREER "Sandy Salinas" is a 73 year old female who presents with a rash from poison ivy exposure.  She developed a rash after coming into contact with poison ivy while trimming around a pecan tree and weeding flowers in her backyard. Despite her known allergy  to poison ivy, she did not see it at the time of exposure.  The rash is causing significant pruritus, affecting her fingers, ears, around her eyes, forehead, sides, and back. She describes the itching as 'itching, itching, itching'. The symptoms began after the exposure last Saturday, with itching starting around Wednesday, indicating a delayed reaction.  She has a history of severe reactions to poison ivy during her childhood, and the current episode is consistent with her past experiences.         04/19/2023    2:41 PM 12/25/2022    9:17 AM 11/16/2022    3:23 PM  Depression screen PHQ 2/9  Decreased Interest 0 0 0  Down, Depressed, Hopeless 0 0 0  PHQ - 2 Score 0 0 0  Altered sleeping  0   Tired, decreased energy  0   Change in appetite  0   Feeling bad or failure about yourself   0   Trouble concentrating  0   Moving slowly or fidgety/restless  0   Suicidal thoughts  0   PHQ-9 Score  0   Difficult doing work/chores  Not difficult at all Not difficult at all    Relevant past medical, surgical, family and social history reviewed and updated as indicated. Interim medical  history since our last visit reviewed. Allergies and medications reviewed and updated.  Review of Systems  Ten systems reviewed and is negative except as mentioned in HPI      Objective:      BP 124/78 (Cuff Size: Normal)   Pulse 75   Temp 97.8 F (36.6 C) (Oral)   Resp 16   Ht 5\' 4"  (1.626 m)   Wt 163 lb 8 oz (74.2 kg)   SpO2 97%   BMI 28.06 kg/m    Wt Readings from Last 3 Encounters:  06/22/23 163 lb 8 oz (74.2 kg)  04/19/23 167 lb 3.2 oz (75.8 kg)  12/25/22 167 lb 6.4 oz (75.9 kg)    Physical Exam Vitals reviewed.  Constitutional:      Appearance: Normal appearance.  HENT:     Head: Normocephalic.  Cardiovascular:     Rate and Rhythm: Normal rate.  Pulmonary:     Effort: Pulmonary effort is normal.  Skin:    Findings: Rash present.  Neurological:     General: No focal deficit present.     Mental Status: She is alert and oriented to person, place, and time. Mental status is at  baseline.  Psychiatric:        Mood and Affect: Mood normal.        Behavior: Behavior normal.        Thought Content: Thought content normal.        Judgment: Judgment normal.       Results for orders placed or performed in visit on 05/23/23  POCT HgB A1C   Collection Time: 05/23/23  8:13 AM  Result Value Ref Range   Hemoglobin A1C 7.1 (A) 4.0 - 5.6 %   HbA1c POC (<> result, manual entry)     HbA1c, POC (prediabetic range)     HbA1c, POC (controlled diabetic range)            Assessment & Plan:   Problem List Items Addressed This Visit   None Visit Diagnoses       Dermatitis due to plants, including poison ivy, sumac, and oak    -  Primary   Relevant Medications   hydrOXYzine (VISTARIL) 25 MG capsule   triamcinolone  ointment (KENALOG ) 0.1 %   predniSONE  (DELTASONE ) 20 MG tablet        Assessment and Plan Assessment & Plan Contact dermatitis due to poison ivy Contact dermatitis from poison ivy exposure while trimming around a pecan tree, presenting with  itching on fingers, ears, around eyes, forehead, sides, and back. Symptoms began a few days post-exposure and have persisted. Discussed potential for steroid treatment to elevate blood glucose levels, considering her type 2 diabetes mellitus. - Prescribe a prednisone  taper: 40 mg for 5 days, 20 mg for 5 days, then 10 mg. - Prescribe Kenalog  ointment for topical application to affected areas. - Prescribe hydroxyzine for pruritus, to be taken every 8 hours as needed, preferably at bedtime due to sedative effects. - Advise taking Zyrtec or Claritin during the day to manage pruritus without causing drowsiness.  Type 2 diabetes mellitus Type 2 diabetes mellitus, potentially impacted by steroid treatment for contact dermatitis. Advised to monitor blood glucose levels closely and adjust management as necessary. - Monitor blood glucose levels closely during steroid treatment and adjust diabetes management as needed.        Follow up plan: Return if symptoms worsen or fail to improve.

## 2023-06-25 ENCOUNTER — Encounter: Payer: Self-pay | Admitting: Nurse Practitioner

## 2023-06-25 ENCOUNTER — Ambulatory Visit: Admitting: Nurse Practitioner

## 2023-06-25 ENCOUNTER — Ambulatory Visit: Payer: Self-pay

## 2023-06-25 VITALS — BP 122/70 | HR 96 | Ht 64.0 in | Wt 163.0 lb

## 2023-06-25 DIAGNOSIS — L255 Unspecified contact dermatitis due to plants, except food: Secondary | ICD-10-CM | POA: Diagnosis not present

## 2023-06-25 DIAGNOSIS — E1165 Type 2 diabetes mellitus with hyperglycemia: Secondary | ICD-10-CM | POA: Diagnosis not present

## 2023-06-25 LAB — POCT GLYCOSYLATED HEMOGLOBIN (HGB A1C): Hemoglobin A1C: 6.7 % — AB (ref 4.0–5.6)

## 2023-06-25 MED ORDER — TRIAMCINOLONE ACETONIDE 40 MG/ML IJ SUSP
40.0000 mg | Freq: Once | INTRAMUSCULAR | Status: AC
  Administered 2023-06-25: 40 mg via INTRAMUSCULAR

## 2023-06-25 NOTE — Progress Notes (Signed)
 BP (!) 162/88   Pulse 96   Ht 5\' 4"  (1.626 m)   Wt 163 lb (73.9 kg)   SpO2 97%   BMI 27.98 kg/m    Subjective:    Patient ID: Sandy Salinas, female    DOB: [birth year], 73 y.o.   MRN: [id number]  HPI: Sandy Salinas is a 73 y.o. female  Chief Complaint  Patient presents with   Rash    Poison oak, not better wants steroid shot     Rash: seen on 06/22/2023,  for dermatitis related to poison ivy. Started on prednisone , zyrtec, kenalog  cream and hydroxyzine .  She was unable to get the prednisone .  She is back today for worsening rash.  Will make sure pharmacy has prednisone  ready for patient to start tomorrow. Will give patient kenalog  injection in office.  Patient also reports she was scheduled to come on Friday to have her A1C checked before she has surgery.  Will get that today.  Her A1C today was 6.7.        04/19/2023    2:41 PM 12/25/2022    9:17 AM 11/16/2022    3:23 PM  Depression screen PHQ 2/9  Decreased Interest 0 0 0  Down, Depressed, Hopeless 0 0 0  PHQ - 2 Score 0 0 0  Altered sleeping  0   Tired, decreased energy  0   Change in appetite  0   Feeling bad or failure about yourself   0   Trouble concentrating  0   Moving slowly or fidgety/restless  0   Suicidal thoughts  0   PHQ-9 Score  0   Difficult doing work/chores  Not difficult at all Not difficult at all    Relevant past medical, surgical, family and social history reviewed and updated as indicated. Interim medical history since our last visit reviewed. Allergies and medications reviewed and updated.  Review of Systems  Ten systems reviewed and is negative except as mentioned in HPI      Objective:      BP (!) 162/88   Pulse 96   Ht 5\' 4"  (1.626 m)   Wt 163 lb (73.9 kg)   SpO2 97%   BMI 27.98 kg/m    Wt Readings from Last 3 Encounters:  06/25/23 163 lb (73.9 kg)  06/22/23 163 lb 8 oz (74.2 kg)  04/19/23 167 lb 3.2 oz (75.8 kg)    Physical Exam Vitals reviewed.   Constitutional:      Appearance: Normal appearance.  HENT:     Head: Normocephalic.  Cardiovascular:     Rate and Rhythm: Normal rate.  Pulmonary:     Effort: Pulmonary effort is normal.  Skin:    Findings: Rash present.  Neurological:     General: No focal deficit present.     Mental Status: She is alert and oriented to person, place, and time. Mental status is at baseline.  Psychiatric:        Mood and Affect: Mood normal.        Behavior: Behavior normal.        Thought Content: Thought content normal.        Judgment: Judgment normal.      Results for orders placed or performed in visit on 06/25/23  POCT HgB A1C   Collection Time: 06/25/23  2:33 PM  Result Value Ref Range   Hemoglobin A1C 6.7 (A) 4.0 - 5.6 %   HbA1c POC (<> result, manual entry)  HbA1c, POC (prediabetic range)     HbA1c, POC (controlled diabetic range)            Assessment & Plan:   Problem List Items Addressed This Visit       Endocrine   Type 2 diabetes mellitus with hyperglycemia, without long-term current use of insulin  (HCC)   A1C 6.7 today      Relevant Orders   POCT HgB A1C (Completed)   Other Visit Diagnoses       Dermatitis due to plants, including poison ivy, sumac, and oak    -  Primary   pick up steroid extended taper, continue kenalog  cream, hydroxyzine  and zyrtec. provide steroid shot while in office.   Relevant Medications   triamcinolone  acetonide (KENALOG -40) injection 40 mg (Completed)             Follow up plan: Return if symptoms worsen or fail to improve.

## 2023-06-25 NOTE — Telephone Encounter (Signed)
 Called disconnected prior to transfer. Called pt back and left message on machine. Will place in call backs.    Copied from CRM [id number]. Topic: Clinical - Red Word Triage >> Jun 25, 2023  8:08 AM Everette C wrote: [facility] that prompted transfer to Nurse Triage: The patient was previously seen for poison ivy/skin irritation that they are continuing to experience that haw now worsened and spread to their face and is approaching their left eye

## 2023-06-25 NOTE — Telephone Encounter (Signed)
  Chief Complaint: poison ivy rash Symptoms: itchy red rash to hands, face, neck, and back Frequency: x2 weeks Pertinent Negatives: Patient denies shortness of breath Disposition: [] ED /[] Urgent Care (no appt availability in office) / [] Appointment(In office/virtual)/ []  [facility]/ [] Home Care/ [] Refused Recommended Disposition /[] [facility]/ [x]  Follow-up with PCP Additional Notes: Patient would like an injection to manage symptoms Copied from CRM [id number]. Topic: Clinical - Red Word Triage >> Jun 25, 2023  8:24 AM Lotus Round B wrote: [facility] that prompted transfer to Nurse Triage: poison ivy Reason for Disposition  [1] Severe poison ivy, oak, or sumac reaction in the past AND [2] face or genitals involved  Answer Assessment - Initial Assessment Questions 1. APPEARANCE of RASH: "Describe the rash."      Blisters, like poison ivy 2. LOCATION: "Where is the rash located?"  (e.g., face, genitals, hands, legs)     Back, fingers, left eye, neck 3. SIZE: "How large is the rash?"      Wide spread 4. ONSET: "When did the rash begin?"      One week 5. ITCHING: "Does the rash itch?" If Yes, ask: "How bad is it?"   - MILD - doesn't interfere with normal activities   - MODERATE-SEVERE: interferes with work, school, sleep, or other activities      severe 6. EXPOSURE:  "How were you exposed to the plant (poison ivy, poison oak, sumac)"  "When were you exposed?"       One week ago, clearing bushes under a tree 7. PAST HISTORY: "Have you had a poison ivy rash before?" If Yes, ask: "How bad was it?"     Yes, very allergic  Protocols used: Poison Ivy - Oak - Sumac-A-AH

## 2023-06-25 NOTE — Assessment & Plan Note (Signed)
 A1C 6.7% today!

## 2023-06-26 ENCOUNTER — Telehealth: Payer: Self-pay

## 2023-06-26 NOTE — Telephone Encounter (Signed)
 Please advise - see below  Copied from CRM [id number]. Topic: Clinical - Medication Question >> Jun 26, 2023  2:20 PM Tiffany S wrote: Reason for CRM: predniSONE  (DELTASONE ) 20 MG tablet [[id number]]  Pharmacy needs clarification on the strength of the medication   [id number]

## 2023-06-26 NOTE — Telephone Encounter (Signed)
 Pharmacy notified to do 10mg  strength

## 2023-06-29 ENCOUNTER — Encounter: Payer: Self-pay | Admitting: Internal Medicine

## 2023-06-29 ENCOUNTER — Ambulatory Visit (INDEPENDENT_AMBULATORY_CARE_PROVIDER_SITE_OTHER): Admitting: Internal Medicine

## 2023-06-29 ENCOUNTER — Other Ambulatory Visit: Payer: Self-pay

## 2023-06-29 ENCOUNTER — Ambulatory Visit: Admitting: Internal Medicine

## 2023-06-29 VITALS — BP 118/76 | HR 75 | Temp 97.8°F | Resp 16 | Ht 64.0 in | Wt 160.4 lb

## 2023-06-29 DIAGNOSIS — Z01818 Encounter for other preprocedural examination: Secondary | ICD-10-CM

## 2023-06-29 NOTE — Progress Notes (Signed)
 Established Patient Office Visit  Subjective   Patient ID: Sandy Salinas, female    DOB: 07-May-1950  Age: 73 y.o. MRN: [id number]  Chief Complaint  Patient presents with   surgical clearance    HPI  Patient is here for pre-op clearance.   Type of Surgery: Son[facility] B[facility] procedure - abdominal liposuction and skin removal  Date of Surgery: 07/04/23 Location of Surgery: [facility] Hx of surgical complications: None Hx of anesthesia complications: None  Hx of MI/stenting, COPD, DM: A1c well controlled, no history of cardiovascular disease or chronic respiratory disorders No history of smoking, alcohol or drug use.  Revised Cardiac Risk Index for major cardiac event (RCRI): pending after labs Pulmonary Risk Score (ARISCAT): pending after labs EKG: sinus bradycardia but HR 59 Labwork: pending This patient is Low/Mod/High pulm risk and low/mod/high cardiac risk with &gt;4/&lt;4 MET score. Patient may/may not proceed with low/mod/high risk procedure as previously planned.   Patient Active Problem List   Diagnosis Date Noted   Type 2 diabetes mellitus with hyperglycemia, without long-term current use of insulin  (HCC) 04/19/2023   Arthritis 04/19/2023   Leukocytosis 03/18/2021   Depression 03/18/2021   Bicipital tendonitis of right shoulder 11/10/2020   Sprain of left foot 11/10/2020   Contusion of right chest wall 11/28/2019   Alzheimer's dementia without behavioral disturbance (HCC) 11/17/2019   Family history of Alzheimer's disease 11/17/2019   Urge incontinence 11/17/2019   Urinary frequency 11/17/2019   Spells of decreased attentiveness 10/28/2019   Trochanteric bursitis of left hip 05/20/2019   Gait abnormality 03/25/2019   Vertigo 03/25/2019   Microalbuminuria due to type 2 diabetes mellitus (HCC) 02/20/2018   Obesity (BMI 30.0-34.9) 02/19/2017   Breast calcification seen on mammogram 12/01/2016   Osteopenia 11/08/2016   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    Abdominal aortic ectasia (HCC) 05/19/2016   Abnormal EKG 05/12/2016   Localized osteoarthritis of lumbar spine 12/13/2015   Xerosis of skin 12/13/2015   Psoriatic arthritis (HCC) 11/18/2015   Lumbar radiculopathy, acute 10/14/2015   Vitamin D  deficiency 08/18/2014   Hyperlipidemia    Hypertension    Primary osteoarthritis of left knee    Type 2 diabetes mellitus with obesity (HCC) 07/24/2014   Status post bariatric surgery 07/24/2014   Past Medical History:  Diagnosis Date   Abdominal aortic ectasia (HCC) 05/19/2016   US  April 2018, rescan in five years (April 2023)   Breast calcification seen on mammogram 12/01/2016   LEFT   Cataract    Depression 03/18/2021   Diabetes mellitus without complication (HCC)    Family history of abdominal aortic aneurysm (AAA) 05/12/2016   father   Hyperlipidemia    Hypertension    Lumbar radiculopathy, acute    Microalbuminuria due to type 2 diabetes mellitus (HCC)    Obesity    Osteoporosis    Primary osteoarthritis of left knee    Psoriasis, unspecified    Pyelonephritis 03/17/2021   Seizures (HCC)    Past Surgical History:  Procedure Laterality Date   ABDOMINAL HYSTERECTOMY  1975   total   BREAST BIOPSY Right 04/09/2012   x 2, FIBROADENOMA WITH COARSE CALCIFICATIONS.done by byrnett   BREAST BIOPSY Left 09/04/2017   Affirm Bx- X clip, FIBROADENOMATOUS CHANGE WITH CALCIFICATIONS. NEGATIVE FOR ATYPIA AND MALIGNANCY   CYSTECTOMY  2003   ROUX-EN-Y GASTRIC BYPASS  2009   TONSILLECTOMY  1970   TUBAL LIGATION  1975   Social History   Tobacco Use   Smoking status: Never  Smokeless tobacco: Never  Vaping Use   Vaping status: Never Used  Substance Use Topics   Alcohol use: No   Drug use: No   Social History   Socioeconomic History   Marital status: Married    Spouse name: Lavonia Powers   Number of children: 2   Years of education: Not on file   Highest education level: Associate degree: occupational, Scientist, product/process development, or vocational program   Occupational History   Occupation: retired  Tobacco Use   Smoking status: Never   Smokeless tobacco: Never  Vaping Use   Vaping status: Never Used  Substance and Sexual Activity   Alcohol use: No   Drug use: No   Sexual activity: Not Currently    Birth control/protection: Abstinence    Comment: Old Age  Other Topics Concern   Not on file  Social History Narrative   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    Social Drivers of Health   Financial Resource Strain: Medium Risk (06/21/2023)   Overall Financial Resource Strain (CARDIA)    Difficulty of Paying Living Expenses: Somewhat hard  Food Insecurity: No Food Insecurity (06/21/2023)   Hunger Vital Sign    Worried About Running Out of Food in the Last Year: Never true    Ran Out of Food in the Last Year: Never true  Transportation Needs: No Transportation Needs (06/21/2023)   PRAPARE - Administrator, Civil Service (Medical): No    Lack of Transportation (Non-Medical): No  Physical Activity: Inactive (06/21/2023)   Exercise Vital Sign    Days of Exercise per Week: 0 days    Minutes of Exercise per Session: 0 min  Stress: Stress Concern Present (06/21/2023)   [employer] of Occupational Health - Occupational Stress Questionnaire    Feeling of Stress : To some extent  Social Connections: Moderately Integrated (06/21/2023)   Social Connection and Isolation Panel [NHANES]    Frequency of Communication with Friends and Family: More than three times a week    Frequency of Social Gatherings with Friends and Family: More than three times a week    Attends Religious Services: More than 4 times per year    Active Member of [employer] or Organizations: No    Attends Banker Meetings: Never    Marital Status: Married  Catering manager Violence: Not At Risk (11/16/2022)   Humiliation, Afraid, Rape, and Kick questionnaire    Fear of Current or Ex-Partner: No    Emotionally Abused: No    Physically  Abused: No    Sexually Abused: No   Family Status  Relation Name Status   Mother Torrence Freeze Deceased       dementia   Father Jules Oar Deceased       AAA   Sister Dannis Dy Deceased   Brother Ollis Bi (Not Specified)   Daughter  Alive   Son  Alive   Son  Alive   Pat Aunt  (Not Specified)   Ala Alice  Deceased   MGM  Deceased   MGF  Deceased   PGM Basil Lim Deceased   PGF  Deceased   Squire Dyers Deceased at age 63       Brain cancer  No partnership data on file   Family History  Problem Relation Age of Onset   Alzheimer's disease Mother    Heart murmur Mother    Dementia Mother    Arthritis Mother    AAA (abdominal aortic aneurysm) Father  Heart disease Father    Asthma Sister    Hypertension Sister    Cancer Sister        breast   Breast cancer Sister        late 3's   Heart disease Brother    Hypertension Brother    Aneurysm Paternal Aunt    Lung cancer Paternal Uncle    Cancer Paternal Grandmother        possible, not sure what kind   Cancer Cousin    Allergies  Allergen Reactions   Codeine Nausea Only and Other (See Comments)    Hallucinations   Jardiance  [Empagliflozin ] Other (See Comments)    Recurrent UTI, yeast infection      Review of Systems  All other systems reviewed and are negative.     Objective:     BP 118/76 (Cuff Size: Large)   Pulse 75   Temp 97.8 F (36.6 C) (Oral)   Resp 16   Ht 5\' 4"  (1.626 m)   Wt 160 lb 6.4 oz (72.8 kg)   SpO2 97%   BMI 27.53 kg/m  BP Readings from Last 3 Encounters:  06/29/23 118/76  06/25/23 122/70  06/22/23 124/78   Wt Readings from Last 3 Encounters:  06/29/23 160 lb 6.4 oz (72.8 kg)  06/25/23 163 lb (73.9 kg)  06/22/23 163 lb 8 oz (74.2 kg)      Physical Exam Constitutional:      Appearance: Normal appearance.  HENT:     Head: Normocephalic and atraumatic.     Mouth/Throat:     Mouth: Mucous membranes are moist.     Pharynx: Oropharynx is clear.  Eyes:      Extraocular Movements: Extraocular movements intact.     Conjunctiva/sclera: Conjunctivae normal.     Pupils: Pupils are equal, round, and reactive to light.  Cardiovascular:     Rate and Rhythm: Normal rate and regular rhythm.  Pulmonary:     Effort: Pulmonary effort is normal.     Breath sounds: Normal breath sounds.  Skin:    General: Skin is warm and dry.  Neurological:     General: No focal deficit present.     Mental Status: She is alert. Mental status is at baseline.  Psychiatric:        Mood and Affect: Mood normal.        Behavior: Behavior normal.      No results found for any visits on 06/29/23.  Last CBC Lab Results  Component Value Date   WBC 7.2 05/11/2022   HGB 12.1 05/11/2022   HCT 38.4 05/11/2022   MCV 83.7 05/11/2022   MCH 26.4 (L) 05/11/2022   RDW 13.6 05/11/2022   PLT 536 (H) 05/11/2022   Last metabolic panel Lab Results  Component Value Date   GLUCOSE 90 05/11/2022   NA 140 05/11/2022   K 4.6 05/11/2022   CL 101 05/11/2022   CO2 30 05/11/2022   BUN 17 05/11/2022   CREATININE 0.88 05/11/2022   EGFR 70 05/11/2022   CALCIUM  9.7 05/11/2022   PROT 6.8 05/11/2022   ALBUMIN 3.6 03/17/2021   LABGLOB 2.4 02/18/2015   AGRATIO 1.8 02/18/2015   BILITOT 0.3 05/11/2022   ALKPHOS 61 03/17/2021   AST 26 05/11/2022   ALT 15 05/11/2022   ANIONGAP 8 03/18/2021   Last lipids Lab Results  Component Value Date   CHOL 142 05/11/2022   HDL 35 (L) 05/11/2022   LDLCALC 79 05/11/2022   TRIG 190 (  H) 05/11/2022   CHOLHDL 4.1 05/11/2022   Last hemoglobin A1c Lab Results  Component Value Date   HGBA1C 6.7 (A) 06/25/2023   Last thyroid  functions Lab Results  Component Value Date   TSH 1.56 02/24/2021   Last vitamin D  Lab Results  Component Value Date   VD25OH 40 05/11/2022   Last vitamin B12 and Folate Lab Results  Component Value Date   VITAMINB12 446 02/24/2021      The 10-year ASCVD risk score (Arnett DK, et al., 2019) is: 23.6%     Assessment & Plan:   1. Pre-operative clearance (Primary): EKG with sinus bradycardia but HR 59, no other abnormalities. Labs pending.   - CBC w/Diff/Platelet - Comprehensive Metabolic Panel (CMET) - EKG 12-Lead   Return for already scheduled.    Rockney Cid, DO

## 2023-06-30 LAB — CBC WITH DIFFERENTIAL/PLATELET
Absolute Lymphocytes: 3562 {cells}/uL (ref 850–3900)
Absolute Monocytes: 762 {cells}/uL (ref 200–950)
Basophils Absolute: 67 {cells}/uL (ref 0–200)
Basophils Relative: 0.6 %
Eosinophils Absolute: 56 {cells}/uL (ref 15–500)
Eosinophils Relative: 0.5 %
HCT: 41.5 % (ref 35.0–45.0)
Hemoglobin: 13 g/dL (ref 11.7–15.5)
MCH: 26.5 pg — ABNORMAL LOW (ref 27.0–33.0)
MCHC: 31.3 g/dL — ABNORMAL LOW (ref 32.0–36.0)
MCV: 84.5 fL (ref 80.0–100.0)
MPV: 10 fL (ref 7.5–12.5)
Monocytes Relative: 6.8 %
Neutro Abs: 6754 {cells}/uL (ref 1500–7800)
Neutrophils Relative %: 60.3 %
Platelets: 521 10*3/uL — ABNORMAL HIGH (ref 140–400)
RBC: 4.91 10*6/uL (ref 3.80–5.10)
RDW: 14.4 % (ref 11.0–15.0)
Total Lymphocyte: 31.8 %
WBC: 11.2 10*3/uL — ABNORMAL HIGH (ref 3.8–10.8)

## 2023-06-30 LAB — COMPREHENSIVE METABOLIC PANEL WITH GFR
AG Ratio: 1.6 (calc) (ref 1.0–2.5)
ALT: 12 U/L (ref 6–29)
AST: 19 U/L (ref 10–35)
Albumin: 4.5 g/dL (ref 3.6–5.1)
Alkaline phosphatase (APISO): 40 U/L (ref 37–153)
BUN: 15 mg/dL (ref 7–25)
CO2: 27 mmol/L (ref 20–32)
Calcium: 9.4 mg/dL (ref 8.6–10.4)
Chloride: 101 mmol/L (ref 98–110)
Creat: 0.73 mg/dL (ref 0.60–1.00)
Globulin: 2.8 g/dL (ref 1.9–3.7)
Glucose, Bld: 98 mg/dL (ref 65–99)
Potassium: 4.5 mmol/L (ref 3.5–5.3)
Sodium: 137 mmol/L (ref 135–146)
Total Bilirubin: 0.4 mg/dL (ref 0.2–1.2)
Total Protein: 7.3 g/dL (ref 6.1–8.1)
eGFR: 87 mL/min/{1.73_m2} (ref >=60)

## 2023-07-03 ENCOUNTER — Encounter: Payer: Self-pay | Admitting: Internal Medicine

## 2023-07-03 ENCOUNTER — Telehealth: Payer: Self-pay

## 2023-07-03 ENCOUNTER — Ambulatory Visit: Payer: Self-pay | Admitting: Internal Medicine

## 2023-07-03 NOTE — Telephone Encounter (Signed)
 Patient has picked up paperwork and surgery scheduled for 07/04/23

## 2023-07-03 NOTE — Telephone Encounter (Signed)
 Copied from CRM [id number]. Topic: Clinical - Lab/Test Results >> Jul 03, 2023  8:58 AM Rennis Case wrote: Reason for CRM: Patient calling to see if EKG results have come back. Advised patient they have not come in yet, pt requesting a call once the results are available.

## 2023-07-03 NOTE — Telephone Encounter (Signed)
 Copied from CRM [id number]. Topic: General - Other >> Jul 03, 2023  9:32 AM Adan Adas P wrote: Reason for CRM: [place] from [facility] called to gt a medical clearance for this patient  [phone] callback#.

## 2023-07-16 ENCOUNTER — Ambulatory Visit: Admitting: Urology

## 2023-07-20 ENCOUNTER — Ambulatory Visit: Admitting: Internal Medicine

## 2023-08-03 ENCOUNTER — Other Ambulatory Visit: Payer: Self-pay | Admitting: Internal Medicine

## 2023-08-03 DIAGNOSIS — I10 Essential (primary) hypertension: Secondary | ICD-10-CM

## 2023-08-06 NOTE — Telephone Encounter (Signed)
 Requested Prescriptions  Pending Prescriptions Disp Refills   chlorthalidone  (HYGROTON ) 25 MG tablet [Pharmacy Med Name: CHLORTHALIDONE  25MG  TABLETS] 90 tablet 1    Sig: TAKE 1 TABLET(25 MG) BY MOUTH DAILY     Cardiovascular: Diuretics - Thiazide Passed - 08/06/2023  2:47 PM      Passed - Cr in normal range and within 180 days    Creat  Date Value Ref Range Status  06/29/2023 0.73 0.60 - 1.00 mg/dL Final   Creatinine, Urine  Date Value Ref Range Status  11/07/2022 27 20 - 275 mg/dL Final         Passed - K in normal range and within 180 days    Potassium  Date Value Ref Range Status  06/29/2023 4.5 3.5 - 5.3 mmol/L Final         Passed - Na in normal range and within 180 days    Sodium  Date Value Ref Range Status  06/29/2023 137 135 - 146 mmol/L Final  02/18/2015 141 134 - 144 mmol/L Final         Passed - Last BP in normal range    BP Readings from Last 1 Encounters:  06/29/23 118/76         Passed - Valid encounter within last 6 months    Recent Outpatient Visits           1 month ago Pre-operative clearance   [facility] Bernardo Fend, DO   1 month ago Dermatitis due to plants, including poison ivy, sumac, and oak   [facility] Gareth Mliss FALCON, FNP   1 month ago Dermatitis due to plants, including poison ivy, sumac, and oak   [facility] Gareth Mliss F, FNP   3 months ago Type 2 diabetes mellitus with hyperglycemia, without long-term current use of insulin  [facility])   [facility] Bernardo Fend, DO       Future Appointments             In 1 month MacDiarmid, Glendia, MD [facility] [place]   In 1 month Bernardo Fend, DO [facility]

## 2023-08-17 ENCOUNTER — Other Ambulatory Visit: Payer: Self-pay | Admitting: Internal Medicine

## 2023-08-17 DIAGNOSIS — E782 Mixed hyperlipidemia: Secondary | ICD-10-CM

## 2023-08-20 NOTE — Telephone Encounter (Signed)
 Requested medication (s) are due for refill today: yes  Requested medication (s) are on the active medication list: yes  Last refill:  08/08/22 #90/3  Future visit scheduled: yes  Notes to clinic:  Unable to refill per protocol due to failed labs, no updated results.      Requested Prescriptions  Pending Prescriptions Disp Refills   atorvastatin  (LIPITOR) 80 MG tablet [Pharmacy Med Name: ATORVASTATIN  80MG  TABLETS] 90 tablet 3    Sig: TAKE 1 TABLET(80 MG) BY MOUTH AT BEDTIME     Cardiovascular:  Antilipid - Statins Failed - 08/20/2023  2:25 PM      Failed - Lipid Panel in normal range within the last 12 months    Cholesterol, Total  Date Value Ref Range Status  02/18/2015 152 100 - 199 mg/dL Final   Cholesterol  Date Value Ref Range Status  05/11/2022 142 <200 mg/dL Final   LDL Cholesterol (Calc)  Date Value Ref Range Status  05/11/2022 79 mg/dL (calc) Final    Comment:    Reference range: <100 . Desirable range <100 mg/dL for primary prevention;   <70 mg/dL for patients with CHD or diabetic patients  with > or = 2 CHD risk factors. SABRA LDL-C is now calculated using the Martin-Hopkins  calculation, which is a validated novel method providing  better accuracy than the Friedewald equation in the  estimation of LDL-C.  Gladis APPLETHWAITE et al. SANDREA. 7986;689(80): 2061-2068  (http://education.Quest[employer].com/faq/FAQ164)    HDL  Date Value Ref Range Status  05/11/2022 35 (L) > OR = 50 mg/dL Final  98/87/7982 40 >60 mg/dL Final   Triglycerides  Date Value Ref Range Status  05/11/2022 190 (H) <150 mg/dL Final         Passed - Patient is not pregnant      Passed - Valid encounter within last 12 months    Recent Outpatient Visits           1 month ago Pre-operative clearance   [facility] Bernardo Fend, DO   1 month ago Dermatitis due to plants, including poison ivy, sumac, and oak   [facility] Gareth Mliss FALCON, FNP   1 month ago Dermatitis due to plants, including poison ivy, sumac, and oak   [facility] Gareth Mliss F, FNP   4 months ago Type 2 diabetes mellitus with hyperglycemia, without long-term current use of insulin  [facility])   [facility] Bernardo Fend, DO       Future Appointments             In 4 weeks MacDiarmid, Glendia, MD [facility]rology [place]   In 4 weeks Bernardo Fend, DO [facility]

## 2023-08-20 NOTE — Telephone Encounter (Signed)
Second request from pharmacy.

## 2023-09-06 ENCOUNTER — Encounter: Payer: Self-pay | Admitting: Pharmacist

## 2023-09-06 ENCOUNTER — Other Ambulatory Visit: Payer: Self-pay | Admitting: Pharmacist

## 2023-09-06 DIAGNOSIS — E1169 Type 2 diabetes mellitus with other specified complication: Secondary | ICD-10-CM

## 2023-09-06 NOTE — Progress Notes (Signed)
   09/06/2023  Patient ID: Sandy Salinas, female   DOB: 1950-05-07, 73 y.o.   MRN: [id number]  Patient enrolled in Ozempic  patient assistance program from [employer].  As of 7/25, Ozempic  will no longer be eligible for automatic refill from [employer]. To request refills after this date, program must receive a refill request form from office up to 30 days prior to refill being due.  Outreach to patient today and provide this update. Advise patient to monitor supply of Ozempic  at home and when has only a 1 month supply remaining, to contact Sandy Salinas: Phone: [phone] to request CPhT start refill form with PCP to be sent to program.  Sandy Salinas, PharmD, [facility] Clinical Pharmacist [facility] [phone]

## 2023-09-06 NOTE — Patient Instructions (Signed)
 As of 7/25, Ozempic  will no longer be automatically refilled from [employer] patient assistance program. To request refills after this date, program must receive a refill request form from office up to 30 days prior to refill being due.  Please monitor your supply of Ozempic  at home and when you have only a 1 month supply remaining, contact Shasta Spear at [phone] to request she start refill process for you.  Thank you!  Sharyle Sia, PharmD, [facility] Clinical Pharmacist [facility] [phone]

## 2023-09-17 ENCOUNTER — Ambulatory Visit (INDEPENDENT_AMBULATORY_CARE_PROVIDER_SITE_OTHER): Admitting: Urology

## 2023-09-17 VITALS — BP 125/76 | HR 82 | Ht 63.0 in | Wt 150.6 lb

## 2023-09-17 DIAGNOSIS — M8580 Other specified disorders of bone density and structure, unspecified site: Secondary | ICD-10-CM | POA: Diagnosis not present

## 2023-09-17 DIAGNOSIS — N3941 Urge incontinence: Secondary | ICD-10-CM

## 2023-09-17 DIAGNOSIS — M8588 Other specified disorders of bone density and structure, other site: Secondary | ICD-10-CM | POA: Diagnosis not present

## 2023-09-17 LAB — URINALYSIS, COMPLETE
Bilirubin, UA: NEGATIVE
Glucose, UA: NEGATIVE
Ketones, UA: NEGATIVE
Leukocytes,UA: NEGATIVE
Nitrite, UA: NEGATIVE
Protein,UA: NEGATIVE
RBC, UA: NEGATIVE
Specific Gravity, UA: 1.015 (ref 1.005–1.030)
Urobilinogen, Ur: 1 mg/dL (ref 0.2–1.0)
pH, UA: 6 (ref 5.0–7.5)

## 2023-09-17 LAB — MICROSCOPIC EXAMINATION: Epithelial Cells (non renal): >10 /HPF — AB (ref 0–10)

## 2023-09-17 MED ORDER — GEMTESA 75 MG PO TABS: 75.0000 mg | ORAL_TABLET | Freq: Every day | ORAL | Status: DC

## 2023-09-17 MED ORDER — GEMTESA 75 MG PO TABS: 75.0000 mg | ORAL_TABLET | Freq: Every day | ORAL | 11 refills | Status: DC

## 2023-09-17 NOTE — Progress Notes (Signed)
 09/17/2023 11:42 AM   Sandy Salinas 1950/05/31 [id number]  Referring provider: Bernardo Fend, DO [address] [place],  [place] [address]  Chief Complaint  Patient presents with   Urinary Incontinence   Urinary Urgency    HPI: I was consulted to assess the patient's voiding dysfunction.  She thinks she has a drop bladder and is not emptying well.  Flow recently is poor.  She can double void minutes later a modest amount.  She voids at least every 1 hour and cannot hold it for 2 hours.  she gets up once at night  Sometimes she has urge incontinence.  No bedwetting.  She can leak with coughing sneezing bending lifting.  She changes a pad system 1-3 times a day and sometimes are quite wet  She gets 2 bladder infections a year.  She may have had a TIA.  She has had a hysterectomy.  No history of kidney stones or bladder surgery she is on oral hypoglycemics   PMH: Past Medical History:  Diagnosis Date   Abdominal aortic ectasia (HCC) 05/19/2016   US  April 2018, rescan in five years (April 2023)   Breast calcification seen on mammogram 12/01/2016   LEFT   Cataract    Depression 03/18/2021   Diabetes mellitus without complication (HCC)    Family history of abdominal aortic aneurysm (AAA) 05/12/2016   father   Hyperlipidemia    Hypertension    Lumbar radiculopathy, acute    Microalbuminuria due to type 2 diabetes mellitus (HCC)    Obesity    Osteoporosis    Primary osteoarthritis of left knee    Psoriasis, unspecified    Pyelonephritis 03/17/2021   Seizures (HCC)     Surgical History: Past Surgical History:  Procedure Laterality Date   ABDOMINAL HYSTERECTOMY  1975   total   BREAST BIOPSY Right 04/09/2012   x 2, FIBROADENOMA WITH COARSE CALCIFICATIONS.done by byrnett   BREAST BIOPSY Left 09/04/2017   Affirm Bx- X clip, FIBROADENOMATOUS CHANGE WITH CALCIFICATIONS. NEGATIVE FOR ATYPIA AND MALIGNANCY   CYSTECTOMY  2003   ROUX-EN-Y GASTRIC BYPASS   2009   TONSILLECTOMY  1970   TUBAL LIGATION  1975    Home Medications:  Allergies as of 09/17/2023       Reactions   Codeine Nausea Only, Other (See Comments)   Hallucinations   Jardiance  [empagliflozin ] Other (See Comments)   Recurrent UTI, yeast infection        Medication List        Accurate as of September 17, 2023 11:42 AM. If you have any questions, ask your nurse or doctor.          STOP taking these medications    blood glucose meter kit and supplies Kit Stopped by: Glendia A Yasheka Fossett   hydrOXYzine  25 MG capsule Commonly known as: VISTARIL  Stopped by: Glendia A Pal Shell   predniSONE  20 MG tablet Commonly known as: DELTASONE  Stopped by: Glendia A Dawanda Mapel   triamcinolone  ointment 0.1 % Commonly known as: KENALOG  Stopped by: Glendia A Laurren Lepkowski   vitamin C 1000 MG tablet Stopped by: Glendia A Elienai Gailey       TAKE these medications    atorvastatin  80 MG tablet Commonly known as: LIPITOR TAKE 1 TABLET(80 MG) BY MOUTH AT BEDTIME   CALTRATE 600+D PO Take by mouth daily.   chlorthalidone  25 MG tablet Commonly known as: HYGROTON  TAKE 1 TABLET(25 MG) BY MOUTH DAILY   DULoxetine  30 MG capsule Commonly known as: CYMBALTA   Take 1 capsule (30 mg total) by mouth at bedtime.   lisinopril  20 MG tablet Commonly known as: ZESTRIL  Take 1 tablet (20 mg total) by mouth daily.   metFORMIN  1000 MG tablet Commonly known as: GLUCOPHAGE  Take 0.5 tablets (500 mg total) by mouth 2 (two) times daily with a meal.   multivitamin tablet Take 1 tablet by mouth daily. Pt taking QHS   Ozempic  (0.25 or 0.5 MG/DOSE) 2 MG/3ML Sopn Generic drug: Semaglutide (0.25 or 0.5MG /DOS) Inject 0.25 mg into the skin once a week.   vitamin B-12 100 MCG tablet Commonly known as: CYANOCOBALAMIN  Take by mouth daily. Pt taking 1000 mcg daily QHS        Allergies:  Allergies  Allergen Reactions   Codeine Nausea Only and Other (See Comments)    Hallucinations   Jardiance   [Empagliflozin ] Other (See Comments)    Recurrent UTI, yeast infection    Family History: Family History  Problem Relation Age of Onset   Alzheimer's disease Mother    Heart murmur Mother    Dementia Mother    Arthritis Mother    AAA (abdominal aortic aneurysm) Father    Heart disease Father    Asthma Sister    Hypertension Sister    Cancer Sister        breast   Breast cancer Sister        late 64's   Heart disease Brother    Hypertension Brother    Aneurysm Paternal Aunt    Lung cancer Paternal Uncle    Cancer Paternal Grandmother        possible, not sure what kind   Cancer Cousin     Social History:  reports that she has never smoked. She has never used smokeless tobacco. She reports that she does not drink alcohol and does not use drugs.  ROS:                                        Physical Exam: BP 125/76 (BP Location: Left Arm, Patient Position: Sitting, Cuff Size: Normal)   Pulse 82   Ht 5' 3 (1.6 m)   Wt 68.3 kg   SpO2 97%   BMI 26.68 kg/m   Constitutional:  Alert and oriented, No acute distress. HEENT: [place] AT, moist mucus membranes.  Trachea midline, no masses. Cardiovascular: No clubbing, cyanosis, or edema. Respiratory: Normal respiratory effort, no increased work of breathing. GI: Abdomen is soft, nontender, nondistended, no abdominal masses GU: On pelvic examination patient had grade 1 hypermobility of the bladder neck and no stress incontinence with light cough.  No cystocele or rectocele. Skin: No rashes, bruises or suspicious lesions. Lymph: No cervical or inguinal adenopathy. Neurologic: Grossly intact, no focal deficits, moving all 4 extremities. Psychiatric: Normal mood and affect.  Laboratory Data: Lab Results  Component Value Date   WBC 11.2 (H) 06/29/2023   HGB 13.0 06/29/2023   HCT 41.5 06/29/2023   MCV 84.5 06/29/2023   PLT 521 (H) 06/29/2023    Lab Results  Component Value Date   CREATININE 0.73  06/29/2023    No results found for: PSA  No results found for: TESTOSTERONE  Lab Results  Component Value Date   HGBA1C 6.7 (A) 06/25/2023    Urinalysis    Component Value Date/Time   COLORURINE YELLOW (A) 03/17/2021 1811   APPEARANCEUR CLOUDY (A) 03/17/2021 1811   LABSPEC 1.024 03/17/2021  1811   PHURINE 5.0 03/17/2021 1811   GLUCOSEU >=500 (A) 03/17/2021 1811   HGBUR LARGE (A) 03/17/2021 1811   BILIRUBINUR Negative 12/25/2022 0931   KETONESUR 5 (A) 03/17/2021 1811   PROTEINUR Negative 12/25/2022 0931   PROTEINUR 100 (A) 03/17/2021 1811   UROBILINOGEN 0.2 12/25/2022 0931   NITRITE Negative 12/25/2022 0931   NITRITE NEGATIVE 03/17/2021 1811   LEUKOCYTESUR Large (3+) (A) 12/25/2022 0931   LEUKOCYTESUR MODERATE (A) 03/17/2021 1811    Pertinent Imaging: Urine reviewed and sent for culture.  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  She has flow symptoms of the feeling of incomplete emptying and a double voiding pattern.  Postvoid residual is 12 mL the role of urodynamics discussed.  The patient will come back on Gemtesa  for pelvic examination cystoscopy.  She states she is most bothered by the frequency.  I went over the pathophysiology and mention urodynamics but we will hold off for now depending on her goals moving forward.  I think your feeling of incomplete emptying is part of the overactive bladder syndrome  1. Urge urinary incontinence (Primary)  - Urinalysis, Complete   No follow-ups on file.  Glendia DELENA Elizabeth, MD  [facility] [address], [address] [place], [place] [address] [phone]

## 2023-09-17 NOTE — Patient Instructions (Signed)

## 2023-09-18 ENCOUNTER — Encounter: Payer: Self-pay | Admitting: Internal Medicine

## 2023-09-18 ENCOUNTER — Ambulatory Visit (INDEPENDENT_AMBULATORY_CARE_PROVIDER_SITE_OTHER): Admitting: Internal Medicine

## 2023-09-18 ENCOUNTER — Other Ambulatory Visit: Payer: Self-pay

## 2023-09-18 VITALS — BP 126/72 | HR 76 | Temp 98.0°F | Resp 16 | Ht 64.0 in | Wt 152.0 lb

## 2023-09-18 DIAGNOSIS — M1990 Unspecified osteoarthritis, unspecified site: Secondary | ICD-10-CM

## 2023-09-18 DIAGNOSIS — E1165 Type 2 diabetes mellitus with hyperglycemia: Secondary | ICD-10-CM

## 2023-09-18 DIAGNOSIS — I10 Essential (primary) hypertension: Secondary | ICD-10-CM

## 2023-09-18 DIAGNOSIS — E782 Mixed hyperlipidemia: Secondary | ICD-10-CM

## 2023-09-18 DIAGNOSIS — Z7984 Long term (current) use of oral hypoglycemic drugs: Secondary | ICD-10-CM | POA: Diagnosis not present

## 2023-09-18 DIAGNOSIS — Z1211 Encounter for screening for malignant neoplasm of colon: Secondary | ICD-10-CM

## 2023-09-18 LAB — POCT GLYCOSYLATED HEMOGLOBIN (HGB A1C): Hemoglobin A1C: 7.2 % — AB (ref 4.0–5.6)

## 2023-09-18 MED ORDER — DULOXETINE HCL 30 MG PO CPEP: 30.0000 mg | ORAL_CAPSULE | Freq: Every evening | ORAL | 1 refills | Status: AC

## 2023-09-18 MED ORDER — CHLORTHALIDONE 25 MG PO TABS: ORAL_TABLET | ORAL | 1 refills | Status: AC

## 2023-09-18 MED ORDER — ATORVASTATIN CALCIUM 80 MG PO TABS: ORAL_TABLET | ORAL | 1 refills | Status: AC

## 2023-09-18 MED ORDER — METFORMIN HCL 1000 MG PO TABS: 500.0000 mg | ORAL_TABLET | Freq: Two times a day (BID) | ORAL | 2 refills | Status: AC

## 2023-09-18 MED ORDER — LISINOPRIL 20 MG PO TABS: 20.0000 mg | ORAL_TABLET | Freq: Every day | ORAL | 1 refills | Status: AC

## 2023-09-18 NOTE — Progress Notes (Signed)
 Established Patient Office Visit  Subjective:  Patient ID: Sandy Salinas, female    DOB: [birth year]  Age: 73 y.o. MRN: [id number]  CC:  Chief Complaint  Patient presents with   Medical Management of Chronic Issues    3 month echeck    HPI [name] presents for follow up on chronic medical conditions.   Discussed the use of AI scribe software for clinical note transcription with the patient, who gave verbal consent to proceed.  History of Present Illness Sandy Salinas is a 73 year old female with diabetes who presents for a follow-up visit.  Her A1c level has increased from 6.7 to 7.2. She attributes this to decreased activity following her back procedure, where she required assistance to get up from a chair during the first week. She is increasing her physical activity by running three times a week.  She experienced two falls in her building, resulting in bruises on her back and buttocks. She does not recall dizziness before the falls and was alone at the time.  Current medications include metformin , quinapril, duloxetine , chlorthalidone , atorvastatin , and Ozempic . She restarted Ozempic  at 0.25 mg weekly and plans to increase to 0.5 mg after a month. She has a three-month supply of Ozempic .   Hypertension: -Medications: Chlorthalidone  25 mg, Lisinopril  10 mg -Patient is compliant with above medications and reports no side effects. -Checking BP at home (average): 120-130/70-80 -Denies any SOB, CP, vision changes, LE edema or symptoms of hypotension  HLD: -Medications: Lipitor 80 mg -Patient is compliant with above medications and reports no side effects.  -Last lipid panel: Lipid Panel     Component Value Date/Time   CHOL 142 05/11/2022 0830   CHOL 152 02/18/2015 0841   TRIG 190 (H) 05/11/2022 0830   HDL 35 (L) 05/11/2022 0830   HDL 40 02/18/2015 0841   CHOLHDL 4.1 05/11/2022 0830   VLDL 35 (H) 06/06/2016 0831   LDLCALC 79 05/11/2022 0830    LABVLDL 28 02/18/2015 0841   Diabetes, Type 2: -Last A1c 10/24 7.2 -Medications: Metformin  500 mg BID, now back on Ozempic  0.25 mg for the last 2 weeks -Medications tried: She cannot take SGLT2's, as she had multiple UTIs while on Jardiance .  We tried glipizide  previously, even with 2.5 dose the patient had an episode of hypoglycemia.  She cannot tolerate any higher doses of metformin  due to GI side effects. -Diet: working on diet, eating more fruit and less processed carbs -Exercise: No regular exercise but active, does yard work -Eye exam: Due, will call to schedule  -Foot exam: Due -Microalbumin: UTD 10/24 -Statin: yes -PNA vaccine: UTD -Denies symptoms of hypoglycemia, polyuria, polydipsia, numbness extremities, foot ulcers/trauma.   Osteoporosis:  -Currently on calcium  and Vitamin D  -DEXA 10/24 t score -2.5 in the lumbar spine, -2.1 in the right femur, -2.9 in the forearm  Urge Incontinence:  -Currently on Gemtesa , just started yesterday and following with Urology   OA: -Unable to take anti-inflammatories secondary to history of gastric bypass -Has been given Cymbalta  30 mg to take at night to help with pain by rheumatology,  Health Maintenance: -Blood work UTD -Mammogram 9/24 BIRADS-1, will call to schedule for this year -Cologuard 6/22 negative - due  Past Medical History:  Diagnosis Date   Abdominal aortic ectasia (HCC) 05/19/2016   US  April 2018, rescan in five years (April 2023)   Breast calcification seen on mammogram 12/01/2016   LEFT   Cataract    Depression 03/18/2021  Diabetes mellitus without complication (HCC)    Family history of abdominal aortic aneurysm (AAA) 05/12/2016   father   Hyperlipidemia    Hypertension    Lumbar radiculopathy, acute    Microalbuminuria due to type 2 diabetes mellitus (HCC)    Obesity    Osteoporosis    Primary osteoarthritis of left knee    Psoriasis, unspecified    Pyelonephritis 03/17/2021   Seizures (HCC)     Past  Surgical History:  Procedure Laterality Date   ABDOMINAL HYSTERECTOMY  1975   total   BREAST BIOPSY Right 04/09/2012   x 2, FIBROADENOMA WITH COARSE CALCIFICATIONS.done by byrnett   BREAST BIOPSY Left 09/04/2017   Affirm Bx- X clip, FIBROADENOMATOUS CHANGE WITH CALCIFICATIONS. NEGATIVE FOR ATYPIA AND MALIGNANCY   CYSTECTOMY  2003   ROUX-EN-Y GASTRIC BYPASS  2009   TONSILLECTOMY  1970   TUBAL LIGATION  1975    Family History  Problem Relation Age of Onset   Alzheimer's disease Mother    Heart murmur Mother    Dementia Mother    Arthritis Mother    AAA (abdominal aortic aneurysm) Father    Heart disease Father    Asthma Sister    Hypertension Sister    Cancer Sister        breast   Breast cancer Sister        late 66's   Heart disease Brother    Hypertension Brother    Aneurysm Paternal Aunt    Lung cancer Paternal Uncle    Cancer Paternal Grandmother        possible, not sure what kind   Cancer Cousin     Social History   Socioeconomic History   Marital status: Married    Spouse name: Merchant navy officer   Number of children: 2   Years of education: Not on file   Highest education level: Associate degree: occupational, Scientist, product/process development, or vocational program  Occupational History   Occupation: retired  Tobacco Use   Smoking status: Never   Smokeless tobacco: Never  Vaping Use   Vaping status: Never Used  Substance and Sexual Activity   Alcohol use: No   Drug use: No   Sexual activity: Not Currently    Birth control/protection: Abstinence    Comment: Old Age  Other Topics Concern   Not on file  Social History Narrative   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    Social Drivers of Health   Financial Resource Strain: Medium Risk (09/17/2023)   Overall Financial Resource Strain (CARDIA)    Difficulty of Paying Living Expenses: Somewhat hard  Food Insecurity: No Food Insecurity (09/17/2023)   Hunger Vital Sign    Worried About Running Out of Food in the Last Year:  Never true    Ran Out of Food in the Last Year: Never true  Transportation Needs: No Transportation Needs (09/17/2023)   PRAPARE - Administrator, Civil Service (Medical): No    Lack of Transportation (Non-Medical): No  Physical Activity: Insufficiently Active (09/17/2023)   Exercise Vital Sign    Days of Exercise per Week: 3 days    Minutes of Exercise per Session: 20 min  Stress: No Stress Concern Present (09/17/2023)   [employer] of Occupational Health - Occupational Stress Questionnaire    Feeling of Stress: Not at all  Recent Concern: Stress - Stress Concern Present (06/21/2023)   [employer] of Occupational Health - Occupational Stress Questionnaire    Feeling of Stress :  To some extent  Social Connections: Moderately Integrated (09/17/2023)   Social Connection and Isolation Panel    Frequency of Communication with Friends and Family: More than three times a week    Frequency of Social Gatherings with Friends and Family: More than three times a week    Attends Religious Services: More than 4 times per year    Active Member of [employer] or Organizations: No    Attends Engineer, structural: Not on file    Marital Status: Married  Catering manager Violence: Not At Risk (11/16/2022)   Humiliation, Afraid, Rape, and Kick questionnaire    Fear of Current or Ex-Partner: No    Emotionally Abused: No    Physically Abused: No    Sexually Abused: No    Outpatient Medications Prior to Visit  Medication Sig Dispense Refill   atorvastatin  (LIPITOR) 80 MG tablet TAKE 1 TABLET(80 MG) BY MOUTH AT BEDTIME 90 tablet 0   Calcium  Carbonate-Vitamin D  (CALTRATE 600+D PO) Take by mouth daily.     chlorthalidone  (HYGROTON ) 25 MG tablet TAKE 1 TABLET(25 MG) BY MOUTH DAILY 90 tablet 1   DULoxetine  (CYMBALTA ) 30 MG capsule Take 1 capsule (30 mg total) by mouth at bedtime. 90 capsule 0   lisinopril  (ZESTRIL ) 20 MG tablet Take 1 tablet (20 mg total) by mouth daily. 90 tablet 1    metFORMIN  (GLUCOPHAGE ) 1000 MG tablet Take 0.5 tablets (500 mg total) by mouth 2 (two) times daily with a meal. 180 tablet 2   Multiple Vitamin (MULTIVITAMIN) tablet Take 1 tablet by mouth daily. Pt taking QHS     Semaglutide ,0.25 or 0.5MG /DOS, (OZEMPIC , 0.25 OR 0.5 MG/DOSE,) 2 MG/3ML SOPN Inject 0.25 mg into the skin once a week. 3 mL 0   Vibegron  (GEMTESA ) 75 MG TABS Take 1 tablet (75 mg total) by mouth daily. 30 tablet 11   vitamin B-12 (CYANOCOBALAMIN ) 100 MCG tablet Take by mouth daily. Pt taking 1000 mcg daily QHS     Vibegron  (GEMTESA ) 75 MG TABS Take 1 tablet (75 mg total) by mouth daily for 28 days. (Patient not taking: Reported on 09/18/2023) 28 tablet    No facility-administered medications prior to visit.    Allergies  Allergen Reactions   Codeine Nausea Only and Other (See Comments)    Hallucinations   Jardiance  [Empagliflozin ] Other (See Comments)    Recurrent UTI, yeast infection    ROS Review of Systems  All other systems reviewed and are negative.     Objective:    Physical Exam Constitutional:      Appearance: Normal appearance.  HENT:     Head: Normocephalic and atraumatic.  Eyes:     Conjunctiva/sclera: Conjunctivae normal.  Cardiovascular:     Rate and Rhythm: Normal rate and regular rhythm.     Pulses:          Dorsalis pedis pulses are 2+ on the right side and 2+ on the left side.  Pulmonary:     Effort: Pulmonary effort is normal.     Breath sounds: Normal breath sounds.  Musculoskeletal:     Right foot: Normal range of motion. No deformity, bunion, Charcot foot, foot drop or prominent metatarsal heads.     Left foot: Normal range of motion. No deformity, bunion, Charcot foot, foot drop or prominent metatarsal heads.  Feet:     Right foot:     Protective Sensation: 6 sites tested.  6 sites sensed.     Skin integrity: Skin integrity normal.  Toenail Condition: Right toenails are normal.     Left foot:     Protective Sensation: 6 sites  tested.  6 sites sensed.     Skin integrity: Skin integrity normal.     Toenail Condition: Left toenails are normal.  Skin:    General: Skin is warm and dry.  Neurological:     General: No focal deficit present.     Mental Status: She is alert. Mental status is at baseline.  Psychiatric:        Mood and Affect: Mood normal.        Behavior: Behavior normal.     BP 126/72 (Cuff Size: Large)   Pulse 76   Temp 98 F (36.7 C) (Oral)   Resp 16   Ht 5' 4 (1.626 m)   Wt 152 lb (68.9 kg)   SpO2 98%   BMI 26.09 kg/m  Wt Readings from Last 3 Encounters:  09/18/23 152 lb (68.9 kg)  09/17/23 150 lb 9.6 oz (68.3 kg)  06/29/23 160 lb 6.4 oz (72.8 kg)     Health Maintenance Due  Topic Date Due   COVID-19 Vaccine (9 - 2024-25 season) 10/08/2022   OPHTHALMOLOGY EXAM  04/26/2023   FOOT EXAM  05/11/2023   Fecal DNA (Cologuard)  07/08/2023   INFLUENZA VACCINE  09/07/2023   Medicare Annual Wellness (AWV)  11/16/2023    There are no preventive care reminders to display for this patient.  Lab Results  Component Value Date   TSH 1.56 02/24/2021   Lab Results  Component Value Date   WBC 11.2 (H) 06/29/2023   HGB 13.0 06/29/2023   HCT 41.5 06/29/2023   MCV 84.5 06/29/2023   PLT 521 (H) 06/29/2023   Lab Results  Component Value Date   NA 137 06/29/2023   K 4.5 06/29/2023   CO2 27 06/29/2023   GLUCOSE 98 06/29/2023   BUN 15 06/29/2023   CREATININE 0.73 06/29/2023   BILITOT 0.4 06/29/2023   ALKPHOS 61 03/17/2021   AST 19 06/29/2023   ALT 12 06/29/2023   PROT 7.3 06/29/2023   ALBUMIN 3.6 03/17/2021   CALCIUM  9.4 06/29/2023   ANIONGAP 8 03/18/2021   EGFR 87 06/29/2023   Lab Results  Component Value Date   CHOL 142 05/11/2022   Lab Results  Component Value Date   HDL 35 (L) 05/11/2022   Lab Results  Component Value Date   LDLCALC 79 05/11/2022   Lab Results  Component Value Date   TRIG 190 (H) 05/11/2022   Lab Results  Component Value Date   CHOLHDL 4.1  05/11/2022   Lab Results  Component Value Date   HGBA1C 7.2 (A) 09/18/2023      Assessment & Plan:   Assessment & Plan Type 2 diabetes mellitus Type 2 diabetes mellitus with recent A1c increase to 7.2, likely due to decreased activity post-surgery. Currently controlled. - Perform diabetic foot exam today. - Continue current diabetes medications including metformin  and Ozempic . - Increase Ozempic  to 0.5 mg after one month if tolerated. - Schedule follow-up in three months for comprehensive labs including A1c.  Hypertension - Blood pressure stable here today, no changes made to medications and appropriate refills sent to pharmacy.   Arthritis - Stable, continue Cymbalta , refilled.   Hyperlipidemia Hyperlipidemia with last cholesterol check in April 2024. Due for fasting lipid panel. - Order fasting lipid panel at next visit. - Instruct her to fast before next appointment. - Continue statin.    - POCT  HgB A1C - metFORMIN  (GLUCOPHAGE ) 1000 MG tablet; Take 0.5 tablets (500 mg total) by mouth 2 (two) times daily with a meal.  Dispense: 180 tablet; Refill: 2 - HM Diabetes Foot Exam - chlorthalidone  (HYGROTON ) 25 MG tablet; TAKE 1 TABLET(25 MG) BY MOUTH DAILY  Dispense: 90 tablet; Refill: 1 - lisinopril  (ZESTRIL ) 20 MG tablet; Take 1 tablet (20 mg total) by mouth daily.  Dispense: 90 tablet; Refill: 1 - atorvastatin  (LIPITOR) 80 MG tablet; TAKE 1 TABLET(80 MG) BY MOUTH AT BEDTIME  Dispense: 90 tablet; Refill: 1 - DULoxetine  (CYMBALTA ) 30 MG capsule; Take 1 capsule (30 mg total) by mouth at bedtime.  Dispense: 90 capsule; Refill: 1 - Cologuard    Follow-up: Return in about 3 months (around 12/19/2023).    Sharyle Fischer, DO

## 2023-09-19 LAB — CULTURE, URINE COMPREHENSIVE

## 2023-09-23 DIAGNOSIS — Z1211 Encounter for screening for malignant neoplasm of colon: Secondary | ICD-10-CM | POA: Diagnosis not present

## 2023-09-29 LAB — COLOGUARD: COLOGUARD: NEGATIVE

## 2023-10-01 ENCOUNTER — Ambulatory Visit: Payer: Self-pay | Admitting: Internal Medicine

## 2023-10-09 ENCOUNTER — Encounter: Payer: Self-pay | Admitting: Internal Medicine

## 2023-10-09 ENCOUNTER — Ambulatory Visit: Payer: Self-pay

## 2023-10-09 NOTE — Telephone Encounter (Signed)
 FYI Only or Action Required?: FYI only for provider.  Patient was last seen in primary care on 09/18/2023 by Bernardo Fend, DO.  Called Nurse Triage reporting Tailbone Pain.  Symptoms began 3 days ago.  Interventions attempted: Rest, hydration, or home remedies.  Symptoms are: unchanged.  Triage Disposition: See Physician Within 24 Hours  Patient/caregiver understands and will follow disposition?: Yes       Copied from CRM #[id number]. Topic: Clinical - Red Word Triage >> Oct 09, 2023  4:53 PM Turkey B wrote: [facility] that prompted transfer to Nurse Triage: patient states rode on a ride and  and hit her tailbone and has severe pain        Reason for Disposition  [1] MODERATE pain (e.g., interferes with normal activities) AND [2] high-risk adult (e.g., age > 60 years, osteoporosis, chronic steroid use)  Answer Assessment - Initial Assessment Questions 1. MECHANISM: How did the injury happen?       Going down water slide and landed on tailbone  2. ONSET: When did the injury happen? (e.g., minutes, hours ago)      3 days ago  3. LOCATION: Where is the injury located?      Tailbone 4. SEVERITY: Can you sit? Can you walk?      Able to sit but with pain  5. PAIN: Is there pain? If Yes, ask: How bad is the pain?  (Scale 0-10; none, mild, moderate, or severe)     Moderate  6. SIZE: For bruises, or swelling, ask: How large is it? (e.g., inches or centimeters)      No  7. OTHER SYMPTOMS: Do you have any other symptoms? (e.g., numbness, back pain)     No  Protocols used: Tailbone Injury-A-AH

## 2023-10-10 ENCOUNTER — Telehealth: Payer: Self-pay

## 2023-10-10 ENCOUNTER — Ambulatory Visit: Admitting: Nurse Practitioner

## 2023-10-10 NOTE — Telephone Encounter (Signed)
 Received a request from [employer] on Ozempic , fill and faxed pap to provider office to sign and date can be fax to [employer] or fax back to [phone].

## 2023-10-10 NOTE — Telephone Encounter (Signed)
 Received provider portion of PAP reorder form [employer] Ozempic ,faxed to [employer] today.

## 2023-11-05 ENCOUNTER — Telehealth: Payer: Self-pay | Admitting: Urology

## 2023-11-05 DIAGNOSIS — N3941 Urge incontinence: Secondary | ICD-10-CM

## 2023-11-05 NOTE — Telephone Encounter (Signed)
 Patient called asking if there is another alternative to Gemtesa  that can be prescribed. Patient stated that she had her RX filled and it was going to cost her $203, which she can not afford. Please advise patient.

## 2023-11-12 MED ORDER — SOLIFENACIN SUCCINATE 5 MG PO TABS: 5.0000 mg | ORAL_TABLET | Freq: Every day | ORAL | 11 refills | Status: AC

## 2023-11-12 NOTE — Telephone Encounter (Signed)
 Pt informed, voiced understanding

## 2023-11-19 ENCOUNTER — Other Ambulatory Visit: Payer: Self-pay | Admitting: Pharmacist

## 2023-11-19 ENCOUNTER — Encounter: Payer: Self-pay | Admitting: Pharmacist

## 2023-11-19 DIAGNOSIS — E1165 Type 2 diabetes mellitus with hyperglycemia: Secondary | ICD-10-CM

## 2023-11-19 NOTE — Progress Notes (Signed)
   11/19/2023  Patient ID: Sandy Salinas, female   DOB: 10-Dec-1950, 73 y.o.   MRN: [id number]  Patient enrolled in Ozempic  patient assistance program from [employer] through 02/06/2024  [employer] has announced that they will no longer offer Ozempic  to [employer] beneficiaries through their Patient Assistance Program in 2026.   Outreach to patient today and provide this update.   Based on reported income, patient does not meet criteria for Extra Help subsidy  Counsel patient that Medicare's Annual Enrollment Period for 2026 starts 11/21/2023. Encourage patient to review deductibles formulary coverage and copay amounts (including for Ozempic ) between plans. Also share with patient the contact information for the [facility] Information Program [facility]) that can help with reviewing these Medicare plans Will also share this information with patient via MyChart message   Patient denies further medication questions or concerns today Provide patient with contact information for clinic pharmacist to contact if needed in future for medication questions/concerns  Sharyle Sia, PharmD, [facility] [phone]

## 2023-11-19 NOTE — Patient Instructions (Signed)
 [employer], the maker of Ozempic  and Rybelsus , has announced that they will no longer offer these medications to Medicare beneficiaries through their Patient Assistance Program in 2026.    This means that if you currently receive Ozempic  or Rybelsus  at no cost through the program, starting in January 2026 you'll need to use your insurance to continue on these medicines.      Choosing a Medicare Plan   With Medicare's Annual Enrollment Period starting on October 15th, we encourage you to review formulary coverage and copay amounts for Ozempic  or Rybelsus , as costs can vary widely between plans. Starting on Wednesday, October 1st you can compare your Medicare plan options by going to the Plan Finder tool at [employer].gov ( TeleconferenceOnDemand.fr ).    There are Medicare Specialists through the [place] [facility] Insurance Information Program ([place]) that can help you shop for Medicare plans.    [place] [facility] toll free number: [phone] (Monday - Friday 8 am - 5 pm)   There are representatives located in each county:    [facility] [address]     [place]  [address] [phone] Call for an appointment with [facility]   Maximum Out-of-Pocket and Prescription Payment Plan   In 2026, the maximum yearly out-of-pocket cost for medications is $2,100 for all Medicare beneficiaries. This means you will not have to pay more than $2,100 in total for all prescription medications filled on your Medicare plan in 2026. You may also choose to enroll in a Medicare Prescription Payment Plan through your chosen 2026 Medicare plan. This lets you spread out your prescription drug costs over the year instead of laying a large amount all at once at the pharmacy.    Sharyle Sia, PharmD, [facility] [phone]

## 2023-11-22 ENCOUNTER — Ambulatory Visit: Payer: Medicare Other

## 2023-11-23 ENCOUNTER — Other Ambulatory Visit: Payer: Self-pay

## 2023-11-23 DIAGNOSIS — E1165 Type 2 diabetes mellitus with hyperglycemia: Secondary | ICD-10-CM

## 2023-12-03 ENCOUNTER — Ambulatory Visit: Payer: Self-pay

## 2023-12-03 NOTE — Telephone Encounter (Signed)
 FYI Only or Action Required?: FYI only for provider.  Patient was last seen in primary care on 09/18/2023 by Bernardo Fend, DO.  Called Nurse Triage reporting Fall and Hip Injury.  Symptoms began several days ago.  Interventions attempted: OTC medications: ibuprofen and Ice/heat application.  Symptoms are: right groin pain worse with certain movement or positions gradually worsening.  Triage Disposition: See Physician Within 24 Hours  Patient/caregiver understands and will follow disposition?: Yes               Copied from CRM #[id number]. Topic: Clinical - Red Word Triage >> Dec 03, 2023  8:28 AM Eva FALCON wrote: Red Word that prompted transfer to Nurse Triage: fell Friday on concreate, fell on right hip, feels like pulled something inside of leg. Severe pain when walking and standing. Reason for Disposition  [1] MODERATE pain (e.g., interferes with normal activities, limping) AND [2] high-risk adult (e.g., age > 60 years, osteoporosis, chronic steroid use)  Answer Assessment - Initial Assessment Questions 1. MECHANISM: How did the injury happen? (e.g., twisting injury, direct blow)      Fall. Patient states she was getting something out of the back of the truck, when she turned she fell onto her right hip. She states she fell due to the concrete was not level.  2. ONSET: When did the injury happen? (e.g., minutes, hours ago)      Friday.  3. LOCATION: Where is the injury located?      Fell on right hip.  4. APPEARANCE of INJURY: What does the injury look like?  (e.g., deformity of leg)     Appears normal.  5. SEVERITY: Can you put weight on that leg? Can you walk?      She states she can bear weight and walk. She states it is just painful.  6. SIZE: For cuts, bruises, or swelling, ask: How large is it? (e.g., inches or centimeters;  entire joint)      No.  7. PAIN: Is there pain? If Yes, ask: How bad is the pain?   What does it keep you from  doing? (Scale 0-10; or none, mild, moderate, severe)     Yes, complains of pain in right groin. She states if she moves her leg the wrong way the pain is an 8/10 and also painful when standing. At rest or in comfortable position it is 4/10. She states she has been icing and using ibuprofen.  8. TETANUS: For any breaks in the skin, ask: When was your last tetanus booster?     No.  9. OTHER SYMPTOMS: Do you have any other symptoms?      No. Denies loss of bowel or bladder control, back pain.  Protocols used: Hip Injury-A-AH

## 2023-12-04 ENCOUNTER — Ambulatory Visit
Admission: RE | Admit: 2023-12-04 | Discharge: 2023-12-04 | Disposition: A | Discharge status: Home / Self Care | Attending: Nurse Practitioner | Admitting: Nurse Practitioner

## 2023-12-04 ENCOUNTER — Encounter: Payer: Self-pay | Admitting: Nurse Practitioner

## 2023-12-04 ENCOUNTER — Ambulatory Visit (INDEPENDENT_AMBULATORY_CARE_PROVIDER_SITE_OTHER): Admitting: Nurse Practitioner

## 2023-12-04 ENCOUNTER — Ambulatory Visit
Admission: RE | Admit: 2023-12-04 | Discharge: 2023-12-04 | Disposition: A | Discharge status: Home / Self Care | Source: Ambulatory Visit | Attending: Nurse Practitioner | Admitting: Nurse Practitioner

## 2023-12-04 ENCOUNTER — Ambulatory Visit: Payer: Self-pay | Admitting: Nurse Practitioner

## 2023-12-04 VITALS — BP 136/88 | HR 57 | Resp 18 | Ht 64.0 in | Wt 153.9 lb

## 2023-12-04 DIAGNOSIS — M25551 Pain in right hip: Secondary | ICD-10-CM

## 2023-12-04 DIAGNOSIS — S7001XA Contusion of right hip, initial encounter: Secondary | ICD-10-CM | POA: Diagnosis not present

## 2023-12-04 NOTE — Progress Notes (Signed)
 BP 136/88   Pulse (!) 57   Resp 18   Ht 5' 4 (1.626 m)   Wt 153 lb 14.4 oz (69.8 kg)   SpO2 97%   BMI 26.42 kg/m    Subjective:    Patient ID: Sandy Salinas, female    DOB: [birth year], 73 y.o.   MRN: [id number]  HPI: Sandy Salinas is a 73 y.o. female  Chief Complaint  Patient presents with   Fall    On friday   Hip Pain    Right and groin pain   Discussed the use of AI scribe software for clinical note transcription with the patient, who gave verbal consent to proceed.  History of Present Illness Sandy Salinas is a 73 year old female who presents with right hip pain following a fall.  Right hip pain following fall - Acute onset of right hip pain after falling on cement on Friday afternoon while helping someone move - Pain is sharp, especially when standing up - Numbing sensation radiates from the hip - Pain localized to the anterior aspect of the right hip - Increased pain with leg lifting - Bruising present on the right side at the site of impact         09/18/2023    9:15 AM 06/29/2023    9:45 AM 04/19/2023    2:41 PM  Depression screen PHQ 2/9  Decreased Interest 0 0 0  Down, Depressed, Hopeless 0 0 0  PHQ - 2 Score 0 0 0    Relevant past medical, surgical, family and social history reviewed and updated as indicated. Interim medical history since our last visit reviewed. Allergies and medications reviewed and updated.  Review of Systems  Ten systems reviewed and is negative except as mentioned in HPI      Objective:      BP 136/88   Pulse (!) 57   Resp 18   Ht 5' 4 (1.626 m)   Wt 153 lb 14.4 oz (69.8 kg)   SpO2 97%   BMI 26.42 kg/m    Wt Readings from Last 3 Encounters:  12/04/23 153 lb 14.4 oz (69.8 kg)  09/18/23 152 lb (68.9 kg)  09/17/23 150 lb 9.6 oz (68.3 kg)    Physical Exam GENERAL: Alert, cooperative, well developed, no acute distress HEENT: Normocephalic, normal oropharynx, moist mucous membranes CHEST:  Clear to auscultation bilaterally, no wheezes, rhonchi, or crackles CARDIOVASCULAR: Normal heart rate and rhythm, S1 and S2 normal without murmurs ABDOMEN: Soft, non-tender, non-distended, without organomegaly, normal bowel sounds EXTREMITIES: No cyanosis or edema MUSCULOSKELETAL: Right hip tender to palpation, no pain on active movement of right hip, pain on lifting right leg, bruise on right hip observed NEUROLOGICAL: Cranial nerves grossly intact, moves all extremities without gross motor or sensory deficit  Results for orders placed or performed in visit on 09/18/23  POCT HgB A1C   Collection Time: 09/18/23  9:19 AM  Result Value Ref Range   Hemoglobin A1C 7.2 (A) 4.0 - 5.6 %   HbA1c POC (<> result, manual entry)     HbA1c, POC (prediabetic range)     HbA1c, POC (controlled diabetic range)    Cologuard   Collection Time: 09/23/23  6:00 PM  Result Value Ref Range   COLOGUARD Negative Negative          Assessment & Plan:   Problem List Items Addressed This Visit   None Visit Diagnoses       Right hip  pain    -  Primary   Relevant Orders   DG HIP UNILAT W OR W/O PELVIS 2-3 VIEWS RIGHT        Assessment and Plan Assessment & Plan Right hip pain after fall Acute right hip pain following a fall on cement on Friday afternoon. Pain is sharp and exacerbated by standing, with associated numbness spreading upwards. Tenderness on palpation and pain with knee bending. Differential diagnosis includes fracture or severe contusion. Bruising present on the right hip. - Ordered right hip X-ray to assess for fracture or severe contusion - Advised her to proceed to the X-ray facility for imaging - Plan to call her with results later in the afternoon        Follow up plan: Return if symptoms worsen or fail to improve.

## 2023-12-05 ENCOUNTER — Other Ambulatory Visit: Admitting: Pharmacist

## 2023-12-09 ENCOUNTER — Encounter: Payer: Self-pay | Admitting: Nurse Practitioner

## 2023-12-10 ENCOUNTER — Ambulatory Visit (INDEPENDENT_AMBULATORY_CARE_PROVIDER_SITE_OTHER): Admitting: Urology

## 2023-12-10 VITALS — BP 121/68 | HR 74 | Ht 63.0 in | Wt 156.0 lb

## 2023-12-10 DIAGNOSIS — N3941 Urge incontinence: Secondary | ICD-10-CM | POA: Diagnosis not present

## 2023-12-10 LAB — URINALYSIS, COMPLETE
Bilirubin, UA: NEGATIVE
Glucose, UA: NEGATIVE
Ketones, UA: NEGATIVE
Leukocytes,UA: NEGATIVE
Nitrite, UA: NEGATIVE
Protein,UA: NEGATIVE
Specific Gravity, UA: 1.015 (ref 1.005–1.030)
Urobilinogen, Ur: 1 mg/dL (ref 0.2–1.0)
pH, UA: 6 (ref 5.0–7.5)

## 2023-12-10 LAB — MICROSCOPIC EXAMINATION: Epithelial Cells (non renal): >10 /HPF — AB (ref 0–10)

## 2023-12-10 MED ORDER — LIDOCAINE HCL URETHRAL/MUCOSAL 2 % EX GEL
1.0000 | Freq: Once | CUTANEOUS | Status: AC
  Administered 2023-12-10: 1 via URETHRAL

## 2023-12-10 NOTE — Progress Notes (Signed)
 12/10/2023 1:17 PM   Sandy Salinas 05-12-1950 [id number]  Referring provider: Bernardo Fend, DO [address] [place],  [place] [address]  Chief Complaint  Patient presents with   Cysto    HPI: I was consulted to assess the patient's voiding dysfunction.  She thinks she has a drop bladder and is not emptying well.  Flow recently is poor.  She can double void minutes later a modest amount.  She voids at least every 1 hour and cannot hold it for 2 hours.  she gets up once at night   Sometimes she has urge incontinence.  No bedwetting.  She can leak with coughing sneezing bending lifting.  She changes a pad system 1-3 times a day and sometimes are quite wet   She gets 2 bladder infections a year.  She may have had a TIA.  She has had a hysterectomy.   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 She has flow symptoms of the feeling of incomplete emptying and a double voiding pattern. Postvoid residual is 12 mL the role of urodynamics discussed. The patient will come back on Gemtesa  for pelvic examination cystoscopy. She states she is most bothered by the frequency. I went over the pathophysiology and mention urodynamics but we will hold off for now depending on her goals moving forward. I think your feeling of incomplete emptying is part of the overactive bladder syndrome    Today Frequency stable.  Last culture negative. Significant reduction in urge incontinence.  She leaked a little bit when she jumped out of her truck yesterday.  Still feels like she has to void often On pelvic examination she had grade 1 hypermobility the bladder neck and leaked a drop after cystoscopy but not prior.  No prolapse Patient underwent flexible cystoscopy.  Bladder Koza and trigone were normal.  No cystitis.  No carcinoma.  Prominent blood vessels within the bladder.    PMH: Past Medical History:  Diagnosis Date   Abdominal aortic ectasia 05/19/2016   US  April 2018, rescan  in five years (April 2023)   Breast calcification seen on mammogram 12/01/2016   LEFT   Cataract    Depression 03/18/2021   Diabetes mellitus without complication (HCC)    Family history of abdominal aortic aneurysm (AAA) 05/12/2016   father   Hyperlipidemia    Hypertension    Lumbar radiculopathy, acute    Microalbuminuria due to type 2 diabetes mellitus (HCC)    Obesity    Osteoporosis    Primary osteoarthritis of left knee    Psoriasis, unspecified    Pyelonephritis 03/17/2021   Seizures (HCC)     Surgical History: Past Surgical History:  Procedure Laterality Date   ABDOMINAL HYSTERECTOMY  1975   total   BREAST BIOPSY Right 04/09/2012   x 2, FIBROADENOMA WITH COARSE CALCIFICATIONS.done by byrnett   BREAST BIOPSY Left 09/04/2017   Affirm Bx- X clip, FIBROADENOMATOUS CHANGE WITH CALCIFICATIONS. NEGATIVE FOR ATYPIA AND MALIGNANCY   CYSTECTOMY  2003   ROUX-EN-Y GASTRIC BYPASS  2009   TONSILLECTOMY  1970   TUBAL LIGATION  1975    Home Medications:  Allergies as of 12/10/2023       Reactions   Codeine Nausea Only, Other (See Comments)   Hallucinations   Jardiance  [empagliflozin ] Other (See Comments)   Recurrent UTI, yeast infection        Medication List        Accurate as of December 10, 2023  1:17 PM. If  you have any questions, ask your nurse or doctor.          STOP taking these medications    Gemtesa  75 MG Tabs Generic drug: Vibegron        TAKE these medications    atorvastatin  80 MG tablet Commonly known as: LIPITOR TAKE 1 TABLET(80 MG) BY MOUTH AT BEDTIME   CALTRATE 600+D PO Take by mouth daily.   chlorthalidone  25 MG tablet Commonly known as: HYGROTON  TAKE 1 TABLET(25 MG) BY MOUTH DAILY   DULoxetine  30 MG capsule Commonly known as: CYMBALTA  Take 1 capsule (30 mg total) by mouth at bedtime.   lisinopril  20 MG tablet Commonly known as: ZESTRIL  Take 1 tablet (20 mg total) by mouth daily.   metFORMIN  1000 MG tablet Commonly known as:  GLUCOPHAGE  Take 0.5 tablets (500 mg total) by mouth 2 (two) times daily with a meal.   multivitamin tablet Take 1 tablet by mouth daily. Pt taking QHS   Ozempic  (0.25 or 0.5 MG/DOSE) 2 MG/3ML Sopn Generic drug: Semaglutide (0.25 or 0.5MG /DOS) Inject 0.25 mg into the skin once a week.   solifenacin 5 MG tablet Commonly known as: VESICARE Take 1 tablet (5 mg total) by mouth daily.   vitamin B-12 100 MCG tablet Commonly known as: CYANOCOBALAMIN  Take by mouth daily. Pt taking 1000 mcg daily QHS        Allergies:  Allergies  Allergen Reactions   Codeine Nausea Only and Other (See Comments)    Hallucinations   Jardiance  [Empagliflozin ] Other (See Comments)    Recurrent UTI, yeast infection    Family History: Family History  Problem Relation Age of Onset   Alzheimer's disease Mother    Heart murmur Mother    Dementia Mother    Arthritis Mother    AAA (abdominal aortic aneurysm) Father    Heart disease Father    Asthma Sister    Hypertension Sister    Cancer Sister        breast   Breast cancer Sister        late 58's   Heart disease Brother    Hypertension Brother    Aneurysm Paternal Aunt    Lung cancer Paternal Uncle    Cancer Paternal Grandmother        possible, not sure what kind   Cancer Cousin     Social History:  reports that she has never smoked. She has never used smokeless tobacco. She reports that she does not drink alcohol and does not use drugs.  ROS:                                        Physical Exam: BP 121/68 (BP Location: Left Arm, Patient Position: Sitting, Cuff Size: Large)   Pulse 74   Ht 5' 3 (1.6 m)   Wt 70.8 kg   SpO2 99%   BMI 27.63 kg/m   Constitutional:  Alert and oriented, No acute distress.   Laboratory Data: Lab Results  Component Value Date   WBC 11.2 (H) 06/29/2023   HGB 13.0 06/29/2023   HCT 41.5 06/29/2023   MCV 84.5 06/29/2023   PLT 521 (H) 06/29/2023    Lab Results  Component Value  Date   CREATININE 0.73 06/29/2023    No results found for: PSA  No results found for: TESTOSTERONE  Lab Results  Component Value Date   HGBA1C 7.2 (A) 09/18/2023  Urinalysis    Component Value Date/Time   COLORURINE YELLOW (A) 03/17/2021 1811   APPEARANCEUR Clear 09/17/2023 1056   LABSPEC 1.024 03/17/2021 1811   PHURINE 5.0 03/17/2021 1811   GLUCOSEU Negative 09/17/2023 1056   HGBUR LARGE (A) 03/17/2021 1811   BILIRUBINUR Negative 09/17/2023 1056   KETONESUR 5 (A) 03/17/2021 1811   PROTEINUR Negative 09/17/2023 1056   PROTEINUR 100 (A) 03/17/2021 1811   UROBILINOGEN 0.2 12/25/2022 0931   NITRITE Negative 09/17/2023 1056   NITRITE NEGATIVE 03/17/2021 1811   LEUKOCYTESUR Negative 09/17/2023 1056   LEUKOCYTESUR MODERATE (A) 03/17/2021 1811    Pertinent Imaging:   Assessment & Plan: Patient primarily has urge incontinence.  She has mild stress incontinence.  I did not order urodynamics.  Like to reassess her on Gemtesa  in about 3 months.  Role of physical therapy discussed.  1. Urge urinary incontinence (Primary)  - Urinalysis, Complete - lidocaine  (XYLOCAINE ) 2 % jelly 1 Application   No follow-ups on file.  Glendia DELENA Elizabeth, MD  [facility] [address], [address] [place], [place] [address] [phone]

## 2023-12-11 ENCOUNTER — Encounter: Payer: Self-pay | Admitting: Internal Medicine

## 2023-12-11 ENCOUNTER — Ambulatory Visit: Payer: Self-pay

## 2023-12-11 ENCOUNTER — Ambulatory Visit: Admitting: Internal Medicine

## 2023-12-11 ENCOUNTER — Other Ambulatory Visit: Payer: Self-pay

## 2023-12-11 VITALS — BP 124/68 | HR 72 | Temp 98.2°F | Resp 16 | Ht 63.0 in | Wt 153.0 lb

## 2023-12-11 DIAGNOSIS — M25551 Pain in right hip: Secondary | ICD-10-CM

## 2023-12-11 MED ORDER — KETOROLAC TROMETHAMINE 60 MG/2ML IM SOLN
60.0000 mg | Freq: Once | INTRAMUSCULAR | Status: AC
  Administered 2023-12-11: 60 mg via INTRAMUSCULAR

## 2023-12-11 NOTE — Telephone Encounter (Signed)
 FYI Only or Action Required?: FYI only for provider: appointment scheduled on 12/11/23.  Patient was last seen in primary care on 12/04/2023 by Gareth Mliss FALCON, FNP.  Called Nurse Triage reporting Leg Pain.  Symptoms began a week ago.  Interventions attempted: OTC medications: Tylenol , Rest, hydration, or home remedies, and Ice/heat application.  Symptoms are: gradually worsening.  Triage Disposition: See Physician Within 24 Hours  Patient/caregiver understands and will follow disposition?: Yes     Copied from CRM #[id number]. Topic: Clinical - Red Word Triage >> Dec 11, 2023  8:36 AM Lonell PEDLAR wrote: Red Word that prompted transfer to Nurse Triage: Patient fell last week, experiencing worsening pain in inner thigh. Cannot walk. Reason for Disposition  Numbness in a leg or foot (i.e., loss of sensation)  Answer Assessment - Initial Assessment Questions 1. ONSET: When did the pain start?      10/28 was seen in office following fall 2. LOCATION: Where is the pain located?      Right inner thigh 3. PAIN: How bad is the pain?    (Scale 1-10; or mild, moderate, severe)     10/10 constant, worse with ambulation 5. CAUSE: What do you think is causing the leg pain?     Fall last week 6. OTHER SYMPTOMS: Do you have any other symptoms? (e.g., chest pain, back pain, breathing difficulty, swelling, rash, fever, numbness, weakness)     Swelling  Protocols used: Leg Pain-A-AH

## 2023-12-11 NOTE — Progress Notes (Signed)
   Acute Office Visit  Subjective:     Patient ID: Sandy Salinas, female    DOB: May 17, 1950, 73 y.o.   MRN: [id number]  Chief Complaint  Patient presents with   Leg Pain    Worsening right leg pain, hard to walk    Leg Pain    Patient is in today for worsening leg pain.   Discussed the use of AI scribe software for clinical note transcription with the patient, who gave verbal consent to proceed.  History of Present Illness Sandy Salinas is a 73 year old female who presents with severe right hip pain following a fall.  She fell last Monday while helping a friend move, landing on her right hip. She experiences sharp pain in the groin area, persisting for nearly two weeks, exacerbated by weight-bearing activities. Walking is difficult without assistance, and she uses a walker but still has significant discomfort.  An x-ray showed no fracture or dislocation, but mild arthritis in the pelvis. Severe pain and swelling persist in the hip area, particularly in the groin.  She underwent gastric bypass surgery 16 years ago, limiting her ability to take oral medications like ibuprofen due to potential gastrointestinal issues. She has not taken any pain medication since the fall.   Review of Systems  Musculoskeletal:  Positive for falls and joint pain.        Objective:    BP 124/68 (Cuff Size: Large)   Pulse 72   Temp 98.2 F (36.8 C) (Oral)   Resp 16   Ht 5' 3 (1.6 m)   Wt 153 lb (69.4 kg)   SpO2 94%   BMI 27.10 kg/m    Physical Exam Constitutional:      Appearance: Normal appearance.  HENT:     Head: Normocephalic and atraumatic.  Eyes:     Conjunctiva/sclera: Conjunctivae normal.  Cardiovascular:     Rate and Rhythm: Normal rate and regular rhythm.  Pulmonary:     Effort: Pulmonary effort is normal.     Breath sounds: Normal breath sounds.  Musculoskeletal:     Right hip: Tenderness and bony tenderness present. Decreased range of motion.   Skin:    General: Skin is warm and dry.  Neurological:     General: No focal deficit present.     Mental Status: She is alert. Mental status is at baseline.  Psychiatric:        Mood and Affect: Mood normal.        Behavior: Behavior normal.     No results found for any visits on 12/11/23.      Assessment & Plan:   Assessment & Plan Right hip pain after fall with suspected right hip and pelvic contusion and underlying right hip osteoarthritis Severe right hip pain post-fall with suspected contusion. X-ray negative for fracture/dislocation, mild pelvic arthritis noted. Persistent non-weight bearing raises concern for occult fracture. Differential includes contusion or occult fracture. Unable to take oral NSAIDs due to gastric bypass. - Administered Toradol  injection for pain. - Referred to orthopedics for further evaluation. - Advised urgent care at [facility] if pain intolerable. - Discussed potential steroid pack if Toradol  effect diminishes before ortho visit.  - AMB referral to orthopedics - ketorolac  (TORADOL ) injection 60 mg  Return if symptoms worsen or fail to improve.  Sharyle Fischer, DO

## 2023-12-13 DIAGNOSIS — M25551 Pain in right hip: Secondary | ICD-10-CM | POA: Diagnosis not present

## 2023-12-14 DIAGNOSIS — S32401A Unspecified fracture of right acetabulum, initial encounter for closed fracture: Secondary | ICD-10-CM | POA: Diagnosis not present

## 2023-12-14 DIAGNOSIS — M7061 Trochanteric bursitis, right hip: Secondary | ICD-10-CM | POA: Diagnosis not present

## 2023-12-14 DIAGNOSIS — S3282XA Multiple fractures of pelvis without disruption of pelvic ring, initial encounter for closed fracture: Secondary | ICD-10-CM | POA: Diagnosis not present

## 2023-12-14 DIAGNOSIS — M25551 Pain in right hip: Secondary | ICD-10-CM | POA: Diagnosis not present

## 2023-12-17 DIAGNOSIS — S32810A Multiple fractures of pelvis with stable disruption of pelvic ring, initial encounter for closed fracture: Secondary | ICD-10-CM | POA: Diagnosis not present

## 2023-12-19 ENCOUNTER — Ambulatory Visit (INDEPENDENT_AMBULATORY_CARE_PROVIDER_SITE_OTHER): Admitting: Internal Medicine

## 2023-12-19 ENCOUNTER — Other Ambulatory Visit: Payer: Self-pay

## 2023-12-19 ENCOUNTER — Encounter: Payer: Self-pay | Admitting: Internal Medicine

## 2023-12-19 VITALS — BP 120/72 | HR 74 | Temp 97.9°F | Resp 16 | Ht 64.0 in | Wt 156.8 lb

## 2023-12-19 DIAGNOSIS — Z7985 Long-term (current) use of injectable non-insulin antidiabetic drugs: Secondary | ICD-10-CM | POA: Diagnosis not present

## 2023-12-19 DIAGNOSIS — I10 Essential (primary) hypertension: Secondary | ICD-10-CM

## 2023-12-19 DIAGNOSIS — E782 Mixed hyperlipidemia: Secondary | ICD-10-CM

## 2023-12-19 DIAGNOSIS — Z23 Encounter for immunization: Secondary | ICD-10-CM | POA: Diagnosis not present

## 2023-12-19 DIAGNOSIS — E559 Vitamin D deficiency, unspecified: Secondary | ICD-10-CM | POA: Diagnosis not present

## 2023-12-19 DIAGNOSIS — Z1231 Encounter for screening mammogram for malignant neoplasm of breast: Secondary | ICD-10-CM

## 2023-12-19 DIAGNOSIS — E1165 Type 2 diabetes mellitus with hyperglycemia: Secondary | ICD-10-CM

## 2023-12-19 NOTE — Progress Notes (Signed)
 Established Patient Office Visit  Subjective:  Patient ID: Sandy Salinas, female    DOB: February 23, 1950  Age: 73 y.o. MRN: [id number]  CC:  Chief Complaint  Patient presents with   Medical Management of Chronic Issues    3 month recheck    HPI Sandy Salinas presents for follow up on chronic medical conditions.   Discussed the use of AI scribe software for clinical note transcription with the patient, who gave verbal consent to proceed.  History of Present Illness  Sandy Salinas is a 73 year old female with a closed pelvic ring fracture who presents for follow-up on her chronic conditions.  She has a closed pelvic ring fracture at the medial aspect of the right acetabulum, confirmed via imaging, which is nondisplaced but causes significant pain. She was prescribed tramadol  for pain management but has not received it due to insurance issues. She prefers to use it sparingly, possibly twice a day.  Her medical history includes coronary artery disease with previous bypass surgery. She manages her chronic conditions by monitoring cholesterol and vitamin D  levels, which have not been checked in over a year. She is due for a mammogram and has an eye checkup scheduled for December 2nd. She has not received her flu vaccine this year.  Her current medications include duloxetine , Lipitor, chlorthalidone , and Ozempic . She has a sufficient supply of Ozempic  at home, taking a 0.5 mg dose, but occasionally forgets to take it. She has one refill left on her duloxetine  prescription, which is a 30 mg dose.  She spends time with a 41 year old lady, impacting her diet with increased availability of cookies and sweets, potentially affecting her A1c levels. Her last A1c was 7.2.   Hypertension: -Medications: Chlorthalidone  25 mg, Lisinopril  20 mg -Patient is compliant with above medications and reports no side effects. -Checking BP at home (average): 120-130/70-80 -Denies any SOB, CP,  vision changes, LE edema or symptoms of hypotension  HLD: -Medications: Lipitor 80 mg -Patient is compliant with above medications and reports no side effects.  -Last lipid panel: Lipid Panel     Component Value Date/Time   CHOL 142 05/11/2022 0830   CHOL 152 02/18/2015 0841   TRIG 190 (H) 05/11/2022 0830   HDL 35 (L) 05/11/2022 0830   HDL 40 02/18/2015 0841   CHOLHDL 4.1 05/11/2022 0830   VLDL 35 (H) 06/06/2016 0831   LDLCALC 79 05/11/2022 0830   LABVLDL 28 02/18/2015 0841   Diabetes, Type 2: -Last A1c 8/25 7.2% -Medications: Metformin  500 mg BID, now back on Ozempic  0.5 mg  -Medications tried: She cannot take SGLT2's, as she had multiple UTIs while on Jardiance .  We tried glipizide  previously, even with 2.5 dose the patient had an episode of hypoglycemia.  She cannot tolerate any higher doses of metformin  due to GI side effects. -Diet: working on diet, eating more fruit and less processed carbs -Exercise: No regular exercise but active, does yard work -Eye exam: Due, will call to schedule  -Foot exam: UTD -Microalbumin: Due -Statin: yes -PNA vaccine: UTD -Denies symptoms of hypoglycemia, polyuria, polydipsia, numbness extremities, foot ulcers/trauma.   Osteoporosis:  -Currently on calcium  and Vitamin D  -DEXA 10/24 t score -2.5 in the lumbar spine, -2.1 in the right femur, -2.9 in the forearm -Recheck Vitamin D  today  Urge Incontinence:  -Currently on Vesicare 5 mg -Following with Urology   OA: -Unable to take anti-inflammatories secondary to history of gastric bypass -Has been given Cymbalta  30 mg to  take at night to help with pain by rheumatology,  Health Maintenance: -Blood work due -Mammogram 9/24 BIRADS-1,  due -Cologuard 8/25 negative -Flu vaccine today  Past Medical History:  Diagnosis Date   Abdominal aortic ectasia 05/19/2016   US  April 2018, rescan in five years (April 2023)   Breast calcification seen on mammogram 12/01/2016   LEFT   Cataract     Depression 03/18/2021   Diabetes mellitus without complication (HCC)    Family history of abdominal aortic aneurysm (AAA) 05/12/2016   father   Hyperlipidemia    Hypertension    Lumbar radiculopathy, acute    Microalbuminuria due to type 2 diabetes mellitus (HCC)    Obesity    Osteoporosis    Primary osteoarthritis of left knee    Psoriasis, unspecified    Pyelonephritis 03/17/2021   Seizures (HCC)     Past Surgical History:  Procedure Laterality Date   ABDOMINAL HYSTERECTOMY  1975   total   BREAST BIOPSY Right 04/09/2012   x 2, FIBROADENOMA WITH COARSE CALCIFICATIONS.done by byrnett   BREAST BIOPSY Left 09/04/2017   Affirm Bx- X clip, FIBROADENOMATOUS CHANGE WITH CALCIFICATIONS. NEGATIVE FOR ATYPIA AND MALIGNANCY   CYSTECTOMY  2003   ROUX-EN-Y GASTRIC BYPASS  2009   TONSILLECTOMY  1970   TUBAL LIGATION  1975    Family History  Problem Relation Age of Onset   Alzheimer's disease Mother    Heart murmur Mother    Dementia Mother    Arthritis Mother    AAA (abdominal aortic aneurysm) Father    Heart disease Father    Asthma Sister    Hypertension Sister    Cancer Sister        breast   Breast cancer Sister        late 80's   Heart disease Brother    Hypertension Brother    Aneurysm Paternal Aunt    Lung cancer Paternal Uncle    Cancer Paternal Grandmother        possible, not sure what kind   Cancer Cousin     Social History   Socioeconomic History   Marital status: Married    Spouse name: Merchant [employer] Officer   Number of children: 2   Years of education: Not on file   Highest education level: Associate degree: occupational, scientist, product/process development, or vocational program  Occupational History   Occupation: retired  Tobacco Use   Smoking status: Never   Smokeless tobacco: Never  Vaping Use   Vaping status: Never Used  Substance and Sexual Activity   Alcohol use: No   Drug use: No   Sexual activity: Not Currently    Birth control/protection: Abstinence    Comment: Old Age  Other  Topics Concern   Not on file  Social History Narrative   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    Social Drivers of Health   Financial Resource Strain: Medium Risk (11/19/2023)   Overall Financial Resource Strain (CARDIA)    Difficulty of Paying Living Expenses: Somewhat hard  Food Insecurity: No Food Insecurity (11/19/2023)   Hunger Vital Sign    Worried About Running Out of Food in the Last Year: Never true    Ran Out of Food in the Last Year: Never true  Transportation Needs: No Transportation Needs (11/19/2023)   PRAPARE - Administrator, Civil Service (Medical): No    Lack of Transportation (Non-Medical): No  Physical Activity: Inactive (11/19/2023)   Exercise Vital Sign    Days  of Exercise per Week: 3 days    Minutes of Exercise per Session: 0 min  Stress: No Stress Concern Present (11/19/2023)   [employer] of Occupational Health - Occupational Stress Questionnaire    Feeling of Stress: Not at all  Social Connections: Moderately Integrated (11/19/2023)   Social Connection and Isolation Panel    Frequency of Communication with Friends and Family: More than three times a week    Frequency of Social Gatherings with Friends and Family: More than three times a week    Attends Religious Services: 1 to 4 times per year    Active Member of [employer] or Organizations: No    Attends Engineer, Structural: Not on file    Marital Status: Married  Catering Manager Violence: Not At Risk (11/16/2022)   Humiliation, Afraid, Rape, and Kick questionnaire    Fear of Current or Ex-Partner: No    Emotionally Abused: No    Physically Abused: No    Sexually Abused: No    Outpatient Medications Prior to Visit  Medication Sig Dispense Refill   atorvastatin  (LIPITOR) 80 MG tablet TAKE 1 TABLET(80 MG) BY MOUTH AT BEDTIME 90 tablet 1   Calcium  Carbonate-Vitamin D  (CALTRATE 600+D PO) Take by mouth daily.     chlorthalidone  (HYGROTON ) 25 MG tablet TAKE 1  TABLET(25 MG) BY MOUTH DAILY 90 tablet 1   DULoxetine  (CYMBALTA ) 30 MG capsule Take 1 capsule (30 mg total) by mouth at bedtime. 90 capsule 1   lisinopril  (ZESTRIL ) 20 MG tablet Take 1 tablet (20 mg total) by mouth daily. 90 tablet 1   metFORMIN  (GLUCOPHAGE ) 1000 MG tablet Take 0.5 tablets (500 mg total) by mouth 2 (two) times daily with a meal. 180 tablet 2   Multiple Vitamin (MULTIVITAMIN) tablet Take 1 tablet by mouth daily. Pt taking QHS     Semaglutide ,0.25 or 0.5MG /DOS, (OZEMPIC , 0.25 OR 0.5 MG/DOSE,) 2 MG/3ML SOPN Inject 0.25 mg into the skin once a week. 3 mL 0   solifenacin (VESICARE) 5 MG tablet Take 1 tablet (5 mg total) by mouth daily. 30 tablet 11   traMADol  HCl 25 MG TABS Take 1 tablet 6 times a day by oral route at bedtime.     vitamin B-12 (CYANOCOBALAMIN ) 100 MCG tablet Take by mouth daily. Pt taking 1000 mcg daily QHS     No facility-administered medications prior to visit.    Allergies  Allergen Reactions   Codeine Nausea Only and Other (See Comments)    Hallucinations   Jardiance  [Empagliflozin ] Other (See Comments)    Recurrent UTI, yeast infection    ROS Review of Systems  Musculoskeletal:  Positive for arthralgias.  All other systems reviewed and are negative.     Objective:    Physical Exam Constitutional:      Appearance: Normal appearance.  HENT:     Head: Normocephalic and atraumatic.  Eyes:     Conjunctiva/sclera: Conjunctivae normal.  Cardiovascular:     Rate and Rhythm: Normal rate and regular rhythm.  Pulmonary:     Effort: Pulmonary effort is normal.     Breath sounds: Normal breath sounds.  Skin:    General: Skin is warm and dry.  Neurological:     General: No focal deficit present.     Mental Status: She is alert. Mental status is at baseline.  Psychiatric:        Mood and Affect: Mood normal.        Behavior: Behavior normal.     BP  120/72 (Cuff Size: Large)   Pulse 74   Temp 97.9 F (36.6 C) (Oral)   Resp 16   Ht 5' 4  (1.626 m)   Wt 156 lb 12.8 oz (71.1 kg)   SpO2 98%   BMI 26.91 kg/m  Wt Readings from Last 3 Encounters:  12/19/23 156 lb 12.8 oz (71.1 kg)  12/11/23 153 lb (69.4 kg)  12/10/23 156 lb (70.8 kg)     Health Maintenance Due  Topic Date Due   OPHTHALMOLOGY EXAM  04/26/2023   Influenza Vaccine  09/07/2023   COVID-19 Vaccine (9 - 2025-26 season) 10/08/2023   Diabetic kidney evaluation - Urine ACR  11/07/2023   Medicare Annual Wellness (AWV)  11/16/2023   Mammogram  10/24/2023    There are no preventive care reminders to display for this patient.  Lab Results  Component Value Date   TSH 1.56 02/24/2021   Lab Results  Component Value Date   WBC 11.2 (H) 06/29/2023   HGB 13.0 06/29/2023   HCT 41.5 06/29/2023   MCV 84.5 06/29/2023   PLT 521 (H) 06/29/2023   Lab Results  Component Value Date   NA 137 06/29/2023   K 4.5 06/29/2023   CO2 27 06/29/2023   GLUCOSE 98 06/29/2023   BUN 15 06/29/2023   CREATININE 0.73 06/29/2023   BILITOT 0.4 06/29/2023   ALKPHOS 61 03/17/2021   AST 19 06/29/2023   ALT 12 06/29/2023   PROT 7.3 06/29/2023   ALBUMIN 3.6 03/17/2021   CALCIUM  9.4 06/29/2023   ANIONGAP 8 03/18/2021   EGFR 87 06/29/2023   Lab Results  Component Value Date   CHOL 142 05/11/2022   Lab Results  Component Value Date   HDL 35 (L) 05/11/2022   Lab Results  Component Value Date   LDLCALC 79 05/11/2022   Lab Results  Component Value Date   TRIG 190 (H) 05/11/2022   Lab Results  Component Value Date   CHOLHDL 4.1 05/11/2022   Lab Results  Component Value Date   HGBA1C 7.2 (A) 09/18/2023      Assessment & Plan:   Assessment & Plan  Closed nondisplaced fracture of right pelvic ring Closed nondisplaced fracture at medial right acetabulum. Conservative management appropriate. - Recommended protective weight bearing with walker or rollator. - Prescribed tramadol  for pain, as needed, up to twice daily. - Scheduled follow-up in two weeks with repeat  pelvis x-rays.  Type 2 diabetes mellitus with hyperglycemia Type 2 diabetes with A1c of 7.2. Monitoring necessary due to potential holiday impact on blood sugar. - Ordered A1c test. - Continue current diabetes management plan.  Mixed hyperlipidemia Requires monitoring. Last cholesterol check over a year and a half ago. - Ordered cholesterol test.  Essential hypertension Requires ongoing management. Labs due and no changes made to medications today.  Vitamin D  deficiency Requires monitoring. - Ordered vitamin D  level test.  General Health Maintenance Routine health maintenance due. - Administered flu vaccine. - Ordered urine microalbumin test. - Ordered blood tests for kidney, liver, electrolytes, and anemia screening. - Ordered mammogram. - Scheduled eye checkup for December 2nd.   - CBC w/Diff/Platelet - Comprehensive Metabolic Panel (CMET) - HgB A1c - Urine Microalbumin w/creat. ratio - Semaglutide ,0.25 or 0.5MG /DOS, (OZEMPIC , 0.25 OR 0.5 MG/DOSE,) 2 MG/3ML SOPN; Inject 0.5 mg into the skin once a week. - Lipid Profile - Vitamin D  (25 hydroxy) - MM 3D SCREENING MAMMOGRAM BILATERAL BREAST; Future - Flu vaccine HIGH DOSE PF(Fluzone Trivalent)   Follow-up: Return in  about 6 months (around 06/17/2024).    Sharyle Fischer, DO

## 2023-12-20 ENCOUNTER — Ambulatory Visit: Payer: Self-pay | Admitting: Internal Medicine

## 2023-12-20 LAB — COMPREHENSIVE METABOLIC PANEL WITH GFR
AG Ratio: 1.6 (calc) (ref 1.0–2.5)
ALT: 9 U/L (ref 6–29)
AST: 22 U/L (ref 10–35)
Albumin: 4.5 g/dL (ref 3.6–5.1)
Alkaline phosphatase (APISO): 79 U/L (ref 37–153)
BUN: 18 mg/dL (ref 7–25)
CO2: 28 mmol/L (ref 20–32)
Calcium: 9.7 mg/dL (ref 8.6–10.4)
Chloride: 101 mmol/L (ref 98–110)
Creat: 0.62 mg/dL (ref 0.60–1.00)
Globulin: 2.8 g/dL (ref 1.9–3.7)
Glucose, Bld: 80 mg/dL (ref 65–139)
Potassium: 4.1 mmol/L (ref 3.5–5.3)
Sodium: 138 mmol/L (ref 135–146)
Total Bilirubin: 0.4 mg/dL (ref 0.2–1.2)
Total Protein: 7.3 g/dL (ref 6.1–8.1)
eGFR: 95 mL/min/1.73m2 (ref >=60)

## 2023-12-20 LAB — LIPID PANEL
Cholesterol: 126 mg/dL (ref <200)
HDL: 42 mg/dL — ABNORMAL LOW (ref >=50)
LDL Cholesterol (Calc): 63 mg/dL
Non-HDL Cholesterol (Calc): 84 mg/dL (ref <130)
Total CHOL/HDL Ratio: 3 (calc) (ref <5.0)
Triglycerides: 126 mg/dL (ref <150)

## 2023-12-20 LAB — CBC WITH DIFFERENTIAL/PLATELET
Absolute Lymphocytes: 2704 {cells}/uL (ref 850–3900)
Absolute Monocytes: 608 {cells}/uL (ref 200–950)
Basophils Absolute: 96 {cells}/uL (ref 0–200)
Basophils Relative: 1.2 %
Eosinophils Absolute: 120 {cells}/uL (ref 15–500)
Eosinophils Relative: 1.5 %
HCT: 38.9 % (ref 35.0–45.0)
Hemoglobin: 12.2 g/dL (ref 11.7–15.5)
MCH: 26.9 pg — ABNORMAL LOW (ref 27.0–33.0)
MCHC: 31.4 g/dL — ABNORMAL LOW (ref 32.0–36.0)
MCV: 85.7 fL (ref 80.0–100.0)
MPV: 9.8 fL (ref 7.5–12.5)
Monocytes Relative: 7.6 %
Neutro Abs: 4472 {cells}/uL (ref 1500–7800)
Neutrophils Relative %: 55.9 %
Platelets: 675 Thousand/uL — ABNORMAL HIGH (ref 140–400)
RBC: 4.54 Million/uL (ref 3.80–5.10)
RDW: 15.7 % — ABNORMAL HIGH (ref 11.0–15.0)
Total Lymphocyte: 33.8 %
WBC: 8 Thousand/uL (ref 3.8–10.8)

## 2023-12-20 LAB — HEMOGLOBIN A1C
Hgb A1c MFr Bld: 7.2 % — ABNORMAL HIGH (ref <5.7)
Mean Plasma Glucose: 160 mg/dL
eAG (mmol/L): 8.9 mmol/L

## 2023-12-20 LAB — MICROALBUMIN / CREATININE URINE RATIO
Creatinine, Urine: 103 mg/dL (ref 20–275)
Microalb Creat Ratio: 4 mg/g{creat} (ref <30)
Microalb, Ur: 0.4 mg/dL

## 2023-12-20 LAB — VITAMIN D 25 HYDROXY (VIT D DEFICIENCY, FRACTURES): Vit D, 25-Hydroxy: 35 ng/mL (ref 30–100)

## 2024-01-01 DIAGNOSIS — S32810A Multiple fractures of pelvis with stable disruption of pelvic ring, initial encounter for closed fracture: Secondary | ICD-10-CM | POA: Diagnosis not present

## 2024-01-09 DIAGNOSIS — E119 Type 2 diabetes mellitus without complications: Secondary | ICD-10-CM | POA: Diagnosis not present

## 2024-01-09 DIAGNOSIS — H2511 Age-related nuclear cataract, right eye: Secondary | ICD-10-CM | POA: Diagnosis not present

## 2024-01-09 DIAGNOSIS — H2512 Age-related nuclear cataract, left eye: Secondary | ICD-10-CM | POA: Diagnosis not present

## 2024-01-09 LAB — OPHTHALMOLOGY REPORT-SCANNED

## 2024-01-23 ENCOUNTER — Inpatient Hospital Stay: Admission: RE | Admit: 2024-01-23 | Discharge: 2024-01-23 | Attending: Internal Medicine

## 2024-01-23 DIAGNOSIS — Z1231 Encounter for screening mammogram for malignant neoplasm of breast: Secondary | ICD-10-CM | POA: Diagnosis present

## 2024-01-28 ENCOUNTER — Other Ambulatory Visit: Payer: Self-pay | Admitting: Internal Medicine

## 2024-01-28 DIAGNOSIS — R928 Other abnormal and inconclusive findings on diagnostic imaging of breast: Secondary | ICD-10-CM

## 2024-02-03 ENCOUNTER — Other Ambulatory Visit: Payer: Self-pay | Admitting: Internal Medicine

## 2024-02-03 DIAGNOSIS — I10 Essential (primary) hypertension: Secondary | ICD-10-CM

## 2024-02-05 NOTE — Telephone Encounter (Signed)
 Requested Prescriptions  Refused Prescriptions Disp Refills   chlorthalidone  (HYGROTON ) 25 MG tablet [Pharmacy Med Name: CHLORTHALIDONE  25MG  TABLETS] 90 tablet 1    Sig: TAKE 1 TABLET(25 MG) BY MOUTH DAILY     Cardiovascular: Diuretics - Thiazide Passed - 02/05/2024  1:58 PM      Passed - Cr in normal range and within 180 days    Creat  Date Value Ref Range Status  12/19/2023 0.62 0.60 - 1.00 mg/dL Final   Creatinine, Urine  Date Value Ref Range Status  12/19/2023 103 20 - 275 mg/dL Final         Passed - K in normal range and within 180 days    Potassium  Date Value Ref Range Status  12/19/2023 4.1 3.5 - 5.3 mmol/L Final         Passed - Na in normal range and within 180 days    Sodium  Date Value Ref Range Status  12/19/2023 138 135 - 146 mmol/L Final  02/18/2015 141 134 - 144 mmol/L Final         Passed - Last BP in normal range    BP Readings from Last 1 Encounters:  12/19/23 120/72         Passed - Valid encounter within last 6 months    Recent Outpatient Visits           1 month ago Type 2 diabetes mellitus with hyperglycemia, without long-term current use of insulin  [facility])   [facility] Bernardo Fend, DO   1 month ago Right hip pain   [facility] Bernardo Fend, DO   2 months ago Right hip pain   [facility] Gareth Clarity F, FNP   4 months ago Type 2 diabetes mellitus with hyperglycemia, without long-term current use of insulin  [facility])   [facility] Bernardo Fend, DO   7 months ago Pre-operative clearance   [facility] Bernardo Fend, DO       Future Appointments             In 1 month MacDiarmid, [place], MD [facility]

## 2024-02-06 ENCOUNTER — Ambulatory Visit
Admission: RE | Admit: 2024-02-06 | Discharge: 2024-02-06 | Disposition: A | Discharge status: Home / Self Care | Source: Ambulatory Visit | Attending: Internal Medicine | Admitting: Internal Medicine

## 2024-02-06 DIAGNOSIS — R928 Other abnormal and inconclusive findings on diagnostic imaging of breast: Secondary | ICD-10-CM | POA: Diagnosis present

## 2024-02-08 ENCOUNTER — Ambulatory Visit: Payer: Self-pay | Admitting: Internal Medicine

## 2024-03-06 NOTE — Telephone Encounter (Signed)
 Copied from CRM [id number]. Topic: General - Other >> Mar 05, 2024  3:45 PM Donna BRAVO wrote: Reason for CRM: Patient returning missed call regarding AWV appt

## 2024-03-10 ENCOUNTER — Ambulatory Visit: Admitting: Urology

## 2024-03-14 ENCOUNTER — Encounter: Payer: Self-pay | Admitting: Urology

## 2024-04-25 ENCOUNTER — Ambulatory Visit

## 2024-05-19 ENCOUNTER — Ambulatory Visit: Admitting: Urology

## 2024-06-18 ENCOUNTER — Ambulatory Visit: Admitting: Internal Medicine
